# Patient Record
Sex: Female | Born: 1945
Health system: Southern US, Community
[De-identification: ages and names within clinical notes are randomized; demographics above are authoritative.]

## PROBLEM LIST (undated history)

## (undated) DIAGNOSIS — M1990 Unspecified osteoarthritis, unspecified site: Secondary | ICD-10-CM

## (undated) DIAGNOSIS — R0981 Nasal congestion: Secondary | ICD-10-CM

## (undated) DIAGNOSIS — J302 Other seasonal allergic rhinitis: Secondary | ICD-10-CM

## (undated) DIAGNOSIS — H269 Unspecified cataract: Secondary | ICD-10-CM

## (undated) DIAGNOSIS — K219 Gastro-esophageal reflux disease without esophagitis: Secondary | ICD-10-CM

## (undated) DIAGNOSIS — D649 Anemia, unspecified: Secondary | ICD-10-CM

## (undated) DIAGNOSIS — Z9889 Other specified postprocedural states: Secondary | ICD-10-CM

## (undated) DIAGNOSIS — E039 Hypothyroidism, unspecified: Secondary | ICD-10-CM

## (undated) DIAGNOSIS — I10 Essential (primary) hypertension: Secondary | ICD-10-CM

## (undated) DIAGNOSIS — C50919 Malignant neoplasm of unspecified site of unspecified female breast: Secondary | ICD-10-CM

## (undated) DIAGNOSIS — R05 Cough: Secondary | ICD-10-CM

## (undated) DIAGNOSIS — M069 Rheumatoid arthritis, unspecified: Secondary | ICD-10-CM

## (undated) DIAGNOSIS — C50411 Malignant neoplasm of upper-outer quadrant of right female breast: Principal | ICD-10-CM

## (undated) DIAGNOSIS — E079 Disorder of thyroid, unspecified: Secondary | ICD-10-CM

## (undated) DIAGNOSIS — R112 Nausea with vomiting, unspecified: Secondary | ICD-10-CM

## (undated) DIAGNOSIS — R059 Cough, unspecified: Secondary | ICD-10-CM

## (undated) HISTORY — DX: Rheumatoid arthritis, unspecified: M06.9

## (undated) HISTORY — DX: Gastro-esophageal reflux disease without esophagitis: K21.9

## (undated) HISTORY — PX: TONSILLECTOMY: SUR1361

## (undated) HISTORY — DX: Unspecified osteoarthritis, unspecified site: M19.90

## (undated) HISTORY — PX: CARPAL TUNNEL RELEASE: SHX101

## (undated) HISTORY — PX: POLYPECTOMY: SHX149

## (undated) HISTORY — PX: THYROIDECTOMY: SHX17

## (undated) HISTORY — PX: TUBAL LIGATION: SHX77

## (undated) HISTORY — DX: Disorder of thyroid, unspecified: E07.9

## (undated) HISTORY — DX: Unspecified cataract: H26.9

## (undated) HISTORY — DX: Malignant neoplasm of upper-outer quadrant of right female breast: C50.411

## (undated) HISTORY — PX: COLONOSCOPY: SHX174

---

## 1997-10-15 ENCOUNTER — Encounter: Admission: RE | Admit: 1997-10-15 | Discharge: 1997-10-15 | Payer: Self-pay | Admitting: Family Medicine

## 1998-08-26 ENCOUNTER — Other Ambulatory Visit: Admission: RE | Admit: 1998-08-26 | Discharge: 1998-08-26 | Payer: Self-pay | Admitting: Internal Medicine

## 1999-06-01 HISTORY — PX: BREAST REDUCTION SURGERY: SHX8

## 1999-09-07 ENCOUNTER — Encounter (INDEPENDENT_AMBULATORY_CARE_PROVIDER_SITE_OTHER): Payer: Self-pay | Admitting: Specialist

## 1999-09-07 ENCOUNTER — Ambulatory Visit (HOSPITAL_BASED_OUTPATIENT_CLINIC_OR_DEPARTMENT_OTHER): Admission: RE | Admit: 1999-09-07 | Discharge: 1999-09-07 | Payer: Self-pay | Admitting: Specialist

## 2000-11-10 ENCOUNTER — Other Ambulatory Visit: Admission: RE | Admit: 2000-11-10 | Discharge: 2000-11-10 | Payer: Self-pay | Admitting: Obstetrics and Gynecology

## 2000-12-15 ENCOUNTER — Other Ambulatory Visit: Admission: RE | Admit: 2000-12-15 | Discharge: 2000-12-15 | Payer: Self-pay | Admitting: Orthopaedic Surgery

## 2001-02-17 ENCOUNTER — Other Ambulatory Visit: Admission: RE | Admit: 2001-02-17 | Discharge: 2001-02-17 | Payer: Self-pay | Admitting: Internal Medicine

## 2001-02-17 ENCOUNTER — Encounter (INDEPENDENT_AMBULATORY_CARE_PROVIDER_SITE_OTHER): Payer: Self-pay | Admitting: Specialist

## 2001-05-31 HISTORY — PX: WRIST ARTHRODESIS: SUR65

## 2002-01-11 ENCOUNTER — Other Ambulatory Visit: Admission: RE | Admit: 2002-01-11 | Discharge: 2002-01-11 | Payer: Self-pay | Admitting: Obstetrics and Gynecology

## 2002-04-17 ENCOUNTER — Ambulatory Visit (HOSPITAL_BASED_OUTPATIENT_CLINIC_OR_DEPARTMENT_OTHER): Admission: RE | Admit: 2002-04-17 | Discharge: 2002-04-17 | Payer: Self-pay | Admitting: Orthopaedic Surgery

## 2002-05-31 HISTORY — PX: KNEE ARTHROSCOPY: SUR90

## 2002-06-05 ENCOUNTER — Ambulatory Visit (HOSPITAL_BASED_OUTPATIENT_CLINIC_OR_DEPARTMENT_OTHER): Admission: RE | Admit: 2002-06-05 | Discharge: 2002-06-05 | Payer: Self-pay | Admitting: Orthopaedic Surgery

## 2003-01-14 ENCOUNTER — Other Ambulatory Visit: Admission: RE | Admit: 2003-01-14 | Discharge: 2003-01-14 | Payer: Self-pay | Admitting: Obstetrics and Gynecology

## 2003-12-28 ENCOUNTER — Emergency Department (HOSPITAL_COMMUNITY): Admission: EM | Admit: 2003-12-28 | Discharge: 2003-12-28 | Payer: Self-pay | Admitting: Emergency Medicine

## 2004-01-15 ENCOUNTER — Other Ambulatory Visit: Admission: RE | Admit: 2004-01-15 | Discharge: 2004-01-15 | Payer: Self-pay | Admitting: Obstetrics and Gynecology

## 2004-04-03 ENCOUNTER — Ambulatory Visit: Payer: Self-pay | Admitting: Internal Medicine

## 2004-05-31 HISTORY — PX: CARPAL TUNNEL RELEASE: SHX101

## 2004-05-31 HISTORY — PX: SHOULDER ARTHROSCOPY W/ ROTATOR CUFF REPAIR: SHX2400

## 2004-06-02 ENCOUNTER — Ambulatory Visit (HOSPITAL_COMMUNITY): Admission: RE | Admit: 2004-06-02 | Discharge: 2004-06-02 | Payer: Self-pay | Admitting: Orthopaedic Surgery

## 2004-06-02 ENCOUNTER — Ambulatory Visit (HOSPITAL_BASED_OUTPATIENT_CLINIC_OR_DEPARTMENT_OTHER): Admission: RE | Admit: 2004-06-02 | Discharge: 2004-06-02 | Payer: Self-pay | Admitting: Orthopaedic Surgery

## 2005-01-19 ENCOUNTER — Other Ambulatory Visit: Admission: RE | Admit: 2005-01-19 | Discharge: 2005-01-19 | Payer: Self-pay | Admitting: Obstetrics and Gynecology

## 2006-02-03 ENCOUNTER — Other Ambulatory Visit: Admission: RE | Admit: 2006-02-03 | Discharge: 2006-02-03 | Payer: Self-pay | Admitting: Obstetrics and Gynecology

## 2006-04-07 ENCOUNTER — Encounter: Admission: RE | Admit: 2006-04-07 | Discharge: 2006-04-07 | Payer: Self-pay | Admitting: Family Medicine

## 2006-06-22 ENCOUNTER — Encounter: Admission: RE | Admit: 2006-06-22 | Discharge: 2006-06-22 | Payer: Self-pay | Admitting: Internal Medicine

## 2007-01-26 ENCOUNTER — Encounter: Admission: RE | Admit: 2007-01-26 | Discharge: 2007-01-26 | Payer: Self-pay | Admitting: Orthopaedic Surgery

## 2007-03-27 ENCOUNTER — Ambulatory Visit: Payer: Self-pay | Admitting: Internal Medicine

## 2007-04-13 ENCOUNTER — Ambulatory Visit: Payer: Self-pay | Admitting: Internal Medicine

## 2007-06-01 HISTORY — PX: CERVICAL FUSION: SHX112

## 2007-08-09 ENCOUNTER — Ambulatory Visit (HOSPITAL_COMMUNITY): Admission: RE | Admit: 2007-08-09 | Discharge: 2007-08-10 | Payer: Self-pay | Admitting: Orthopedic Surgery

## 2009-11-21 ENCOUNTER — Encounter: Admission: RE | Admit: 2009-11-21 | Discharge: 2009-11-21 | Payer: Self-pay | Admitting: Internal Medicine

## 2010-05-15 ENCOUNTER — Encounter
Admission: RE | Admit: 2010-05-15 | Discharge: 2010-05-15 | Payer: Self-pay | Source: Home / Self Care | Attending: Podiatry | Admitting: Podiatry

## 2010-06-22 ENCOUNTER — Other Ambulatory Visit: Payer: Self-pay

## 2010-10-13 NOTE — Op Note (Signed)
NAME:  Evelyn Orr, Evelyn Orr            ACCOUNT NO.:  [id number]   MEDICAL RECORD NO.:  [id number]          PATIENT TYPE:  AMB   LOCATION:  SDS                          FACILITY:  [facility]   PHYSICIAN:  Alvy Beal, MD    DATE OF BIRTH:  1946-04-18   DATE OF PROCEDURE:  08/09/2007  DATE OF DISCHARGE:                               OPERATIVE REPORT   HISTORY:  Vara Guardian is a very pleasant 65 year old woman with longstanding  cervical spine pain and bilateral upper extremity and shoulder pain.  She has been under my care for quite some time and I attempted to treat  her pain conservatively.  She has failed to improve with injection  therapy, physiotherapy, manipulations and narcotic medications.  Because  of progression of her symptoms and ongoing pain, she elected to proceed  with surgery.  All appropriate risks, benefits and alternatives to  surgery were discussed with the patient and she elected to proceed with  surgery.   PREOPERATIVE DIAGNOSIS:  Two level cervical degenerative disk disease  with radicular pain C5-6, C6-7.   POSTOPERATIVE DIAGNOSIS:  Two level cervical degenerative disk disease  with radicular pain C5-6, C6-7.   OPERATIVE PROCEDURE:  Anterior cervical diskectomy and fusion C5-6, C6-  7.   FIRST ASSISTANT:  Crissie Reese, PA   OPERATIVE NOTE:  The patient is brought to the operating room, placed  supine on the operating table.  After successful induction of general  anesthesia endotracheal intubation, TEDs, SCDs and Foley were applied.  Rolled towels were placed beneath the shoulder blades.  The shoulders  were taped down and the anterior cervical spine was prepped and draped  in standard fashion.  A standard Clementeen Graham approach through  longitudinal left sided incision was then made.  An incision was made on  the medial side of the sternocleidomastoid.  Sharp dissection was  carried out down to and through the platysma.  I sharply dissected  through the deep  cervical fascia, identified the omohyoid and released  it from its sleeve.  This allowed me to mobilize enough that I did not  need to resect it.   At this point I could freely mobilized the trachea, esophagus medially  and I could palpate the anterior cervical spine.  Once was palpable, I  then swept the deep cervical fascia, prevertebral fascia off and exposed  the anterior cervical longitudinal ligament.  A needle was placed at the  5-6 interspace and lateral fluoroscopy was used to confirm that this was  the appropriate level.  Once confirmed I then resected the anterior  longitudinal ligament from the mid body of C5 to the midbody of C7 with  electrocautery and mobilized the longus coli muscles out laterally to  the level of the uncovertebral joints.  I then placed distraction pins  into the bodies of C5, C6 and C7 and then placed self-retaining  retractor in the wound.  Once the Caspar blades were positioned, I  deflated the endotracheal cuff, expanded the distractor and reinflated  the cuff.   At this point I used a 15 blade scalpel to incise the 5-6  disk space  then used a combination of pituitary rongeurs, curettes and Kerrison  rongeurs to resect the entire disk material.  Once all the disk material  resected, I then used a fine micro curette to develop a plane underneath  the posterior longitudinal ligament.  That was resected with a 1-mm  Kerrison.  At this point I had excellent visualization of the anterior  thecal sac and I could freely pass a micro nerve hook underneath the  bodies of C5 and C6 and out the uncovertebral joints. Satisfied with the  diskectomy/decompression, I repeated the entire procedure in similar  fashion at C6-7.   Once both diskectomies were done, I then measured the interbody space  and placed 5 mm parallel precut graft packed with DBX at 5-6 and a 5 mm  lordotic graft at 6-7.  I then contoured a 28 mm long anterior cervical  Vector [employer]ynthes plate  and affixed it to the body of C5 and C7 with 14 mm  variable angle screws and affixed it to the body of C6 with 14 mm  screws.  I then removed all the retractors and checked to ensure that  the esophagus was not entrapped beneath the plate then returned the  trachea and esophagus to midline.  I copiously irrigated with normal  saline, obtained hemostasis using bipolar electrocautery, closed the  platysma with interrupted 2-0 Vicryl sutures and the skin with 3-0  Monocryl.  Steri-Strips, dry dressing and a collar were applied.  She  was extubated and transferred to PACU without incident.  At the end of  the case, all needle and sponge counts were correct.      Alvy Beal, MD  Electronically Signed     DDB/MEDQ  D:  08/09/2007  T:  08/10/2007  Job:  [id number]

## 2010-10-16 NOTE — Op Note (Signed)
[place]. [facility]  Patient:    Evelyn Orr, Evelyn Orr                     MRN: [id number] Adm. Date:  [id number] Attending:  Gustavus Messing                           Operative Report  INDICATIONS FOR PROCEDURE:  Patient with severe macromastia with back and shoulder pain secondary to large, pendulous breast, with the left side larger than the right, increased pitting of both shoulder areas and intertrigo as well as increased accessory breast tissue.  PROCEDURES PLANNED:  Bilateral breast reduction and excision of accessory breast tissue.  SURGEON:  Yaakov Guthrie. Shon Hough, M.D.  ASSISTANT:  ______  ANESTHESIA:  General.  DESCRIPTION OF PROCEDURE:  The patient was set up and drawn for the inferior pedicle reduction mammoplasty.  We Marcained the nipple areolar complexes back o 20 cm from the suprasternal notch.  She then underwent general anesthesia and was intubated orally.  Prep was done to the chest and breast areas in a routine fashion using Betadine soap and solution, walled off with sterile towels and drapes so s to make a sterile field.  The areas were scored with a #15 blade and the  skin ver the inferior pedicles epithelialized with a #20 blade.  Next, the medial and lateral fatty dermal pedicles were excised underlying.  The pectoralis fascia outlining mammary tissue was taken as well as accessory breast tissue.  The new  keyhole area was debulked and ______ and transposed and stayed with 3-0 Prolene. Subcutaneous closure was done with 3-0 Monocryl x 2 layers and a running subcuticular stitch with 3-0 Monocryl and 5-0 Monocryl throughout the inverted . They were drained with #10 Blake drains which were placed in the depths of the wound and brought out through the lateral portions of the incision and secured ith 3-0 Prolene.  Steri-Strips and soft dressings were applied to all of the areas including Xeroform, 4 x 4, ABDs, Hypafix  tape.  She withstood the procedure very well and was taken to recovery in good condition.  She had approximately 100 cc  loss. DD:  09/07/99 TD:  09/08/99 Job: [id number]

## 2010-10-16 NOTE — Op Note (Signed)
NAMEMarland Kitchen  Evelyn Orr, Evelyn Orr                        ACCOUNT NO.:  [id number]   MEDICAL RECORD NO.:  [id number]                   PATIENT TYPE:  AMB   LOCATION:  DSC                                  FACILITY:  [facility]   PHYSICIAN:  Lubertha Basque. Jerl Santos, M.D.             DATE OF BIRTH:  1945/09/01   DATE OF PROCEDURE:  06/05/2002  DATE OF DISCHARGE:                                 OPERATIVE REPORT   PREOPERATIVE DIAGNOSES:  1. Right knee degenerative joint disease.  2. Right knee synovitis.   POSTOPERATIVE DIAGNOSES:  1. Right knee degenerative joint disease.  2. Right knee synovitis.   OPERATION PERFORMED:  1. Right knee chondroplasty  2. Right knee synovectomy.   SURGEON:  Lubertha Basque. Jerl Santos, M.D.   ASSISTANT:  Prince Rome, P.A.   ANESTHESIA:  General.   INDICATIONS FOR PROCEDURE:  The patient is a 65 year old woman with a  history consistent with rheumatoid arthritis.  She has had difficulty with  right knee pain and swelling for many months.  She has had several  aspirations and injections which have afforded her transient relief.  By x-  ray she does not have much in the way of joint space narrowing and at this  point was offered an arthroscopy.  The procedure was discussed with the  patient and informed operative consent was obtained after discussion of  possible complications of reaction to anesthesia and infection.   DESCRIPTION OF PROCEDURE:  The patient was taken to the operating suite  where general anesthetic was applied without difficulty.  The patient was  positioned supine and prepped and draped in the normal sterile fashion.  After administration of preop intravenous antibiotics, an arthroscopy of the  right knee was performed through two inferior portals.  The suprapatellar  pouch was benign as was the patellofemoral joint.  The medial compartment  exhibited some large flaps of articular cartilage which were loose off the  medial femoral condyle.  This was  addressed with a thorough debridement back  to stable structures.  Most of the change was grade 3 in nature.  She also  had an abundance of hypertrophic erythematous synovium consistent with an  inflammatory arthropathy.  A thorough synovectomy was done in this  compartment and also in the lateral compartment and to a lesser degree  through the notch.  The lateral compartment showed no evidence of meniscal  or articular cartilage injury and the ACL and PCL were intact.  The knee was  thoroughly irrigated followed by placement of Marcaine with epinephrine and  morphine plus Depo-Medrol.  Adaptic was placed over the portals followed by  dry gauze and a loose Ace wrap.  Estimated blood loss and intraoperative  fluids can be obtained from anesthesia records.    DISPOSITION:  The patient was taken to the recovery room in stable  condition.  Plans were for the patient  to go home the same day  and to  follow up in the office in less than a week.  I will contact her by phone  tonight.                                                 Lubertha Basque Jerl Santos, M.D.    PGD/MEDQ  D:  06/05/2002  T:  06/05/2002  Job:  [id number]

## 2010-10-16 NOTE — Op Note (Signed)
NAMEMarland Kitchen  Evelyn Orr, Evelyn Orr            ACCOUNT NO.:  [id number]   MEDICAL RECORD NO.:  [id number]          PATIENT TYPE:  AMB   LOCATION:  DSC                          FACILITY:  [facility]   PHYSICIAN:  Lubertha Basque. Dalldorf, M.D.DATE OF BIRTH:  07-Sep-1945   DATE OF PROCEDURE:  06/02/2004  DATE OF DISCHARGE:                                 OPERATIVE REPORT   PREOPERATIVE DIAGNOSES:  1.  Right shoulder rotator cuff tear.  2.  Right shoulder impingement.  3.  Right hand carpal tunnel syndrome.   POSTOPERATIVE DIAGNOSES:  1.  Right shoulder partial rotator cuff tear.  2.  Right shoulder impingement.  3.  Right hand carpal tunnel syndrome.   ANESTHESIA:  General.   ATTENDING SURGEON:  Lubertha Basque. Jerl Santos, M.D.   ASSISTANT:  Lindwood Qua, P.A.   INDICATION FOR PROCEDURE:  The patient is a 65 year old woman with a long  history of right arm difficulty.  She has had nerve studies done in the past  which have revealed a mild carpal tunnel syndrome.  She has been treated  with braces and anti-inflammatories and an injection.  She persists with  some difficulty.  She has persistent numbness with activity and some night  pain.  At the shoulder, she has great difficulty which makes it troublesome  for her to sleep on this side.  She has trouble using her arm out in front  of herself and overhead.  By MRI, she has a small rotator cuff tear and  things consistent with impingement.  She did respond in a transient fashion  to subacromial injection  She has been offered arthroscopy of the shoulder  and a carpal tunnel release at the same sitting on the right.  Informed  operative consent was obtained after discussion of possible complication of,  reaction to anesthesia, infection, and neurovascular injury.   DESCRIPTION OF PROCEDURE:  The patient was taken to the operating suite  where general anesthetic was applied without difficulty.  She was positioned  in the beach-chair position and prepped and  draped in normal sterile  fashion.  After the administration of preoperative IV antibiotics, A  tourniquet was inflated about her forearm, exsanguinating the hand and  wrist.  A small palmar incision was made with dissection down to the  transverse carpal ligament.  The incision was near the thenar flexion crease  and did not cross the wrist flexion crease.  Dissection was carried down to  the ligament which was released under direct visualization.  The dissection  was taken distally to the transverse arch of vessels proximally through the  distal fascia of the forearm.  There was some mild thickening of the  ligament with some mild compression of the nerve.  This wound was then  irrigated, followed by reapproximation of the skin with vertical mattresses  of nylon.  Some Marcaine was injected, and the tourniquet was deflated. The  fingers became pink and warm immediately.  Adaptic was applied to the wound  followed by a dry gauze and a loose Ace wrap.  Attention was then turned  toward the shoulder.  An arthroscopy was  performed through a total of two  portals.  The glenohumeral joint showed no degenerative change while the  biceps tendon and rotator cuff appeared intact from below.  In the  subacromial space, she had a great deal of bursitis and things consistent  with a partial-thickness rotator cuff tear which was debrided.  No full-  thickness tear could be seen.  She had a prominent subacromial morphology  addressed with an acromioplasty back to a flat surface.  This was done with  a bur in the lateral position followed by transfer of the bur to the  posterior position.  The [facility] joint appeared benign and was not addressed as  she had no pain in that location.  The shoulder was thoroughly irrigated  followed by the placement of Marcaine with epinephrine and morphine.  Simple  sutures of nylon were used to loosely reapproximate the portals, followed by  Adaptic and a dry gauze dressing  with tape.  Estimated blood loss and  intraoperative records can be obtained from the anesthesia records.   DISPOSITION:  The patient was extubated in the operating room and taken to  the recovery room in stable condition.  Plans were for her to go home the  same day and to follow up in the office in less than a week.  I will contact  her by phone tonight.      Cindee Lame   PGD/MEDQ  D:  06/02/2004  T:  06/02/2004  Job:  [id number]

## 2010-10-16 NOTE — Op Note (Signed)
NAME:  Evelyn Orr, Evelyn Orr                        ACCOUNT NO.:  [id number]   MEDICAL RECORD NO.:  [id number]                   PATIENT TYPE:  AMB   LOCATION:  DSC                                  FACILITY:  [facility]   PHYSICIAN:  Lubertha Basque. Jerl Santos, M.D.             DATE OF BIRTH:  08-17-1945   DATE OF PROCEDURE:  04/17/2002  DATE OF DISCHARGE:                                 OPERATIVE REPORT   PREOPERATIVE DIAGNOSES:  1. Left wrist basal joint arthritis.  2. Left wrist carpal tunnel syndrome.  3. Right knee degenerative joint disease.   POSTOPERATIVE DIAGNOSES:  1. Left wrist basal joint arthritis.  2. Left wrist carpal tunnel syndrome.  3. Right knee degenerative joint disease.   OPERATION PERFORMED:  1. A left wrist LRTI.  2. Left wrist carpal tunnel release.  3. Right knee injection.   SURGEON:  Lubertha Basque. Jerl Santos, M.D.   ASSISTANT:  Prince Rome, P.A.   ANESTHESIA:  General.   INDICATIONS FOR PROCEDURE:  The patient is a 65 year old woman with a long  history of left wrist pain.  This persisted despite oral anti-  inflammatories, bracing and injections.  She is noted to have end stage  degenerative change at her basal joint.  She has developed a large cyst in  the area as well.  She has also had symptoms of carpal tunnel with numbness  in the median distribution.  Nerve studies just show a mild carpal tunnel  syndrome.  At this point she is offered operative intervention at her thumb  as she has disabling pain with activities of daily living including  activities at her job.  As she is going to the operating table for an  anesthetic, we decided to perform the carpal tunnel release as well.  She  also mentioned some right knee pain and swelling and wished to have this  joint injected, so that was offered to her as well.  The procedures were  discussed with the patient and informed operative consent was obtained after  discussion of possible complications of reaction  to anesthesia, infection,  neurovascular injury.   DESCRIPTION OF PROCEDURE:  The patient was taken to the operating suite  where general anesthetic was applied without difficulty.  The patient was  positioned supine and prepped and draped in the normal sterile fashion.  After administration of preop intravenous antibiotics, the left arm was  elevated, exsanguinated, tourniquet inflated about the upper arm.  An  incision was made along the basal joint which extended up the forearm  slightly over the FCR tendon.  Dissection was carried down to the FCR which  was freed up, up into the forearm.  Dissection was then carried down to the  basal joint.  Volar and dorsal flaps were made of the basal joint capsule.  She had a large cyst emanating from this joint which was excised.  A saw,  rongeur and osteotomes  were used to remove the entire trapezium.  X-ray was  used to confirm that this entire bone had been removed.  The FCR tendon was  then harvested with a tendon stripper up to the musculotendinous junction.  A separate incision was made on the forearm for part of this harvest.  This  was left attached distally at the base of the second metacarpal.  A drill  hole was made in the first metacarpal base on the side with the nail which  was dorsal.  This was done with a 3/16 drill bit.  The FCR tendon which had  been harvested was passed through the base of the metacarpal and out the  dorsal aspect through this hole.  This tendon was then sutured at the exit  point from this hole and then to itself in the base of the wound to  reconstruct the beak ligament.  The remaining tendon was then wrapped up on  itself to form an anchovy.  Ethibond suture was used to stabilize this.  An  Ethibond was then placed in the base of the basal joint area and passed  through this anchovy.  The anchovy was then pushed down along this Ethibond  suture and the suture was utilized to secure the anchovy in the base of  the  wound.  The capsule of the basal joint was then repaired with absorbable  suture.  A carpal tunnel release was then performed through a separate  incision.  A small palmar incision was made ulnar to the thenar flexion  crease which did not cross the wrist flexion crease.  This was 4 or 5 cm  away from her basal joint incision.  Dissection was carried down through the  palmar fascia to expose the transverse carpal ligament which was released  under direct visualization.  Dissection was taken distally to the transverse  arch of vessels and proximally to the distal fascia of the forearm.  Once  this had been completed, the surgeon's small finger could be inserted  through the carpal tunnel without much difficulty.  The wound was irrigated.  The tourniquet was then deflated.  It appeared that I had inadvertently cut  a portion of the radial artery.  She had good refill ulnarly and excellent  refill to the tips of her fingers.  It was felt best just to ligate this and  this was done with some Vicryl suture.  She had an excellent ulnar pulse and  good refill to the tips of all of her fingers after this had been  accomplished.  some pressure was held on the wounds until the bleeding had  slowed and the carpal tunnel incision was closed with nylon vertical  mattresses.  The basal joint incision was closed with identical suture in  vertical mattress fashion.  The small incision in the forearm was also  reapproximated with the same suture.  Adaptic was placed on the wounds  followed by dry gauze and a thumb spica split of plaster.  After sterile  prep with Betadine and alcohol, I then injected the right knee with Depo-  Medrol and lidocaine.   DISPOSITION:  The patient was extubated in the operating room and taken to  recovery room in stable condition.  Plans were for her to stay overnight for  pain control and probable discharge home in the morning.  Lubertha Basque Jerl Santos, M.D.    PGD/MEDQ  D:  04/17/2002  T:  04/17/2002  Job:  [id number]

## 2011-02-22 LAB — URINALYSIS, ROUTINE W REFLEX MICROSCOPIC
Glucose, UA: NEGATIVE
Hgb urine dipstick: NEGATIVE
Ketones, ur: NEGATIVE
Protein, ur: NEGATIVE
pH: 5

## 2011-02-22 LAB — URINE CULTURE: Colony Count: 7000

## 2011-02-22 LAB — BASIC METABOLIC PANEL
BUN: 6
CO2: 26
CO2: 28
Calcium: 9.3
Calcium: 9.5
Chloride: 105
Creatinine, Ser: 0.61
GFR calc Af Amer: >60
GFR calc non Af Amer: >60
Glucose, Bld: 105 — ABNORMAL HIGH
Glucose, Bld: 113 — ABNORMAL HIGH
Potassium: 3.9
Sodium: 140

## 2011-02-22 LAB — CBC
HCT: 40
Hemoglobin: 12.6
Hemoglobin: 13.7
MCHC: 33.3
MCHC: 34.1
MCV: 92.2
Platelets: 312
RBC: 4.34
RDW: 13.8
RDW: 13.9

## 2011-02-22 LAB — URINE MICROSCOPIC-ADD ON

## 2011-03-25 ENCOUNTER — Ambulatory Visit (INDEPENDENT_AMBULATORY_CARE_PROVIDER_SITE_OTHER): Payer: Self-pay | Admitting: General Surgery

## 2011-04-08 ENCOUNTER — Encounter (INDEPENDENT_AMBULATORY_CARE_PROVIDER_SITE_OTHER): Payer: Self-pay | Admitting: General Surgery

## 2011-04-08 ENCOUNTER — Ambulatory Visit (INDEPENDENT_AMBULATORY_CARE_PROVIDER_SITE_OTHER): Payer: BC Managed Care – PPO | Admitting: General Surgery

## 2011-04-08 VITALS — BP 128/88 | HR 60 | Temp 97.0°F | Resp 16 | Ht 62.0 in | Wt 171.1 lb

## 2011-04-08 DIAGNOSIS — R928 Other abnormal and inconclusive findings on diagnostic imaging of breast: Secondary | ICD-10-CM

## 2011-04-08 DIAGNOSIS — R921 Mammographic calcification found on diagnostic imaging of breast: Secondary | ICD-10-CM

## 2011-04-08 NOTE — Patient Instructions (Signed)
You will need to get a follow-up Right breast mammogram in April 2013

## 2011-04-08 NOTE — Progress Notes (Signed)
Chief Complaint  Patient presents with  . New Evaluation    eval of breast calcifications     HPI Evelyn Orr is a 65 y.o. female.   HPI 65 year old African American female referred by her primary care physician's office for evaluation for right breast microcalcifications. The patient states that she underwent her routine annual mammogram and was called back for a followup mammogram on her right side.  She denies any breast pain. She denies any nipple or skin changes. She denies any nipple discharge. She denies any weight loss. She denies any lymphadenopathy. She denies any previous breast biopsies. She denies any personal history of cancer. She states that she had a paternal aunt with breast cancer. She also states that her maternal grandfather died at age 7 from colon cancer.  Menarche was at age 73. She's had 2 pregnancies 2 children. Her first pregnancy was at age 55. She is currently going thru menopause. She is not on any hormone replacement therapy. Past Medical History  Diagnosis Date  . Arthritis   . Thyroid disease     hypothyroidism    Past Surgical History  Procedure Date  . Breast reduction surgery     History reviewed. No pertinent family history.  Social History History  Substance Use Topics  . Smoking status: Former Games developer  . Smokeless tobacco: Never Used  . Alcohol Use: Yes    No Known Allergies  Current Outpatient Prescriptions  Medication Sig Dispense Refill  . levothyroxine (SYNTHROID, LEVOTHROID) 88 MCG tablet Take 88 mcg by mouth daily.        . Methotrexate Sodium (METHOTREXATE PO) Take by mouth. Pt takes 8 pills once a week         Review of Systems Review of Systems  Constitutional: Negative for fever, chills, fatigue and unexpected weight change.  HENT: Negative for hearing loss, congestion, sore throat, trouble swallowing and voice change.   Eyes: Negative for visual disturbance.  Respiratory: Negative for apnea, cough and wheezing.    Cardiovascular: Negative for chest pain, palpitations and leg swelling.  Gastrointestinal: Negative for nausea, vomiting, abdominal pain, diarrhea, constipation, blood in stool, abdominal distention and anal bleeding.  Genitourinary: Negative for hematuria, vaginal bleeding and difficulty urinating.  Musculoskeletal: Negative for arthralgias.  Skin: Negative for rash and wound.  Neurological: Negative for seizures, syncope and headaches.  Hematological: Negative for adenopathy. Does not bruise/bleed easily.  Psychiatric/Behavioral: Negative for confusion.    Blood pressure 128/88, pulse 60, temperature 97 F (36.1 C), temperature source Temporal, resp. rate 16, height 5\' 2"  (1.575 m), weight 171 lb 2 oz (77.622 kg).  Physical Exam Physical Exam  Vitals reviewed. Constitutional: She is oriented to person, place, and time. She appears well-developed and well-nourished. No distress.  HENT:  Head: Normocephalic and atraumatic.  Eyes: Pupils are equal, round, and reactive to light. No scleral icterus.  Neck: Normal range of motion. Neck supple. No thyromegaly present.  Cardiovascular: Normal rate, regular rhythm and normal heart sounds.   Pulmonary/Chest: Effort normal and breath sounds normal. No respiratory distress. She has no wheezes. Right breast exhibits no inverted nipple, no mass, no nipple discharge and no skin change. Left breast exhibits no inverted nipple, no mass, no nipple discharge and no skin change.         B/l breast reductions scars. No masses. nontender  Abdominal: Soft. Bowel sounds are normal. She exhibits no distension. There is no tenderness.  Musculoskeletal: Normal range of motion. She exhibits no edema.  Lymphadenopathy:    She has no cervical adenopathy.  Neurological: She is alert and oriented to person, place, and time. She exhibits normal muscle tone.  Skin: Skin is warm and dry.  Psychiatric: She has a normal mood and affect. Her behavior is normal.  Judgment and thought content normal.    Data Reviewed Personally reviewed her mammograms from 10/12 and 2011 Personally reviewed mammography reports from 2012 and 2011  March 02, 2011-additional imaging of the right breast shows a tight less than 5 mm cluster of microcalcifications. Calcifications have dystrophic appearance. Associated density is stable compared to films back to 2006. There are no signs of malignancy. BI-RADS 3  March 01, 2011-fulfill digital mammography was performed and reviewed with computer-aided detection. Compared to previous, on the right there is a potential abnormality. There is been no significant interval change on the left.  February 26, 2010-bilateral screening mammogram-scattered parenchymal densities throughout both breasts with no worrisome findings were significant interval change.   Assessment    Right breast microcalifications    Plan    We discussed breast calcifications. We reviewed her mammograms together. We discussed continued observation with short interval followup with a right breast mammogram in April 2013 versus stereotactic biopsy. We discussed the pros and cons of each treatment option. My suspicion for this being a malignancy is low. The patient has elected to do short interval followup with a right breast mammogram in 6 months at [facility].  Mary Sella. Andrey Campanile, MD, FACS General, Bariatric, & Minimally Invasive Surgery [facility], [place]        [facility] M 04/08/2011, 2:39 PM

## 2011-05-26 ENCOUNTER — Encounter (INDEPENDENT_AMBULATORY_CARE_PROVIDER_SITE_OTHER): Payer: Self-pay | Admitting: Obstetrics and Gynecology

## 2012-03-17 ENCOUNTER — Encounter: Payer: Self-pay | Admitting: Obstetrics and Gynecology

## 2012-03-17 ENCOUNTER — Ambulatory Visit (INDEPENDENT_AMBULATORY_CARE_PROVIDER_SITE_OTHER): Payer: BC Managed Care – PPO | Admitting: Obstetrics and Gynecology

## 2012-03-17 VITALS — BP 112/74 | HR 76 | Resp 12 | Wt 172.0 lb

## 2012-03-17 DIAGNOSIS — Z01419 Encounter for gynecological examination (general) (routine) without abnormal findings: Secondary | ICD-10-CM

## 2012-03-17 DIAGNOSIS — E079 Disorder of thyroid, unspecified: Secondary | ICD-10-CM

## 2012-03-17 DIAGNOSIS — M069 Rheumatoid arthritis, unspecified: Secondary | ICD-10-CM

## 2012-03-17 DIAGNOSIS — Z124 Encounter for screening for malignant neoplasm of cervix: Secondary | ICD-10-CM

## 2012-03-17 NOTE — Progress Notes (Signed)
Last Pap: 03-12-11  WNL: Yes Regular Periods:no Contraception: menopause   Monthly Breast exam:yes Tetanus<63yrs:yes Nl.Bladder Function:yes Daily BMs:yes Healthy Diet:yes Calcium:no Mammogram:yes Date of Mammogram: 03-10-12 Exercise:yes Have often Exercise: 2 x week Seatbelt: yes Abuse at home: no Stressful work:yes Sigmoid-colonoscopy: 2010? Bone Density: No PCP: Dorothyann Peng MD Change in PMH: no Change in FMH:no

## 2012-03-17 NOTE — Progress Notes (Signed)
Subjective:    Evelyn Orr is a 66 y.o. female No obstetric history on file. who presents for annual exam. The patient has no complaints today.    Review of Systems Gastrointestinal:No change in bowel habits, no abdominal pain, no rectal bleeding Genitourinary:negative for abnormal vaginal bleeding,  dysuria, frequency, hematuria, nocturia and urinary incontinence   Objective:     BP 112/74  Pulse 76  Resp 12  Wt 172 lb (78.019 kg) Weight:  Wt Readings from Last 1 Encounters:  03/17/12 172 lb (78.019 kg)   There is no height on file to calculate BMI. General Appearance:  Well nourished in no acute distress HEENT: Grossly normal Neck / Thyroid: Supple, no masses, nodes or enlargement Lungs: Clear to auscultation bilaterally Back: No CVA tenderness Breast Exam: No masses or nodes.No dimpling, nipple retraction or discharge. Cardiovascular: Regular rate and rhythm.  Gastrointestinal: Soft, non-tender, no masses or organomegaly Pelvic Exam: EGBUS-normal, vagina-mildly atrophic, mucosa without lesions, cervix-no tenderness or lesions, uterus-appears normal size, shape and consistency, adnexae-no masses or tenderness Rectovaginal: Normal sphincter tone and  no masses  Lymphatic Exam: Non-palpable nodes in neck, clavicular, axillary, or inguinal regions Skin: No rash or abnormalities Extremities: no clubbing cyanosis or edema Neurologic: Grossly normal Psychiatric: Alert and oriented x 3    Assessment:   Routine GYN Exam   Plan:   PAP sent  RTO 1 year or prn   Lukus Binion,ELMIRAPA-C

## 2013-05-31 HISTORY — PX: COLONOSCOPY: SHX174

## 2013-07-31 ENCOUNTER — Ambulatory Visit (INDEPENDENT_AMBULATORY_CARE_PROVIDER_SITE_OTHER): Payer: BC Managed Care – PPO | Admitting: Podiatry

## 2013-07-31 ENCOUNTER — Encounter: Payer: Self-pay | Admitting: Podiatry

## 2013-07-31 ENCOUNTER — Ambulatory Visit: Payer: Self-pay | Admitting: Podiatry

## 2013-07-31 VITALS — BP 128/77 | HR 76 | Ht 62.0 in | Wt 164.0 lb

## 2013-07-31 DIAGNOSIS — M21969 Unspecified acquired deformity of unspecified lower leg: Secondary | ICD-10-CM | POA: Insufficient documentation

## 2013-07-31 DIAGNOSIS — M7661 Achilles tendinitis, right leg: Secondary | ICD-10-CM

## 2013-07-31 DIAGNOSIS — M79606 Pain in leg, unspecified: Secondary | ICD-10-CM

## 2013-07-31 DIAGNOSIS — M79609 Pain in unspecified limb: Secondary | ICD-10-CM

## 2013-07-31 DIAGNOSIS — M7660 Achilles tendinitis, unspecified leg: Secondary | ICD-10-CM | POA: Insufficient documentation

## 2013-07-31 NOTE — Progress Notes (Signed)
Subjective: 68 year old female presents complaining of pain on right heel posterior plantar and under 5th MPJ right foot x 2 months.  Had history on left ankle injury in February 2015. 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   Objective: Hypermobile first and 5th ray right. No edema or erythema at the right heel. Positive of mild erythema under the 5th MPJ right. Subjective pain at plantar posterior aspect right heel.   Assessment: Tenosynovitis posterior plantar right heel. Achilles tendonitis right. Hypermobile first and 5th ray right.  Plan: Reviewed all findings and available options, injection, new orthotics, NSAIA. Will try Compounding cream at this time.

## 2013-07-31 NOTE — Patient Instructions (Signed)
Will try compounding cream. Return in 4 weeks.

## 2013-08-01 ENCOUNTER — Telehealth: Payer: Self-pay | Admitting: *Deleted

## 2013-08-01 NOTE — Telephone Encounter (Signed)
Pt called today 08/01/2013 and she states she already has this compounding cream, the same that was ordered yesterday.  She wanted Dr. Caffie Pinto to know this

## 2013-08-07 ENCOUNTER — Encounter: Payer: Self-pay | Admitting: Podiatry

## 2013-08-15 ENCOUNTER — Ambulatory Visit (INDEPENDENT_AMBULATORY_CARE_PROVIDER_SITE_OTHER): Payer: BC Managed Care – PPO

## 2013-08-15 ENCOUNTER — Encounter: Payer: Self-pay | Admitting: Podiatry

## 2013-08-15 ENCOUNTER — Ambulatory Visit: Payer: BC Managed Care – PPO

## 2013-08-15 ENCOUNTER — Ambulatory Visit (INDEPENDENT_AMBULATORY_CARE_PROVIDER_SITE_OTHER): Payer: BC Managed Care – PPO | Admitting: Podiatry

## 2013-08-15 VITALS — Ht 62.0 in | Wt 160.0 lb

## 2013-08-15 DIAGNOSIS — M722 Plantar fascial fibromatosis: Secondary | ICD-10-CM

## 2013-08-15 DIAGNOSIS — M79609 Pain in unspecified limb: Secondary | ICD-10-CM

## 2013-08-15 DIAGNOSIS — M79673 Pain in unspecified foot: Secondary | ICD-10-CM

## 2013-08-15 MED ORDER — TRIAMCINOLONE ACETONIDE 10 MG/ML IJ SUSP
10.0000 mg | Freq: Once | INTRAMUSCULAR | Status: AC
  Administered 2013-08-15: 10 mg

## 2013-08-15 NOTE — Patient Instructions (Signed)

## 2013-08-15 NOTE — Progress Notes (Signed)
   Subjective:    Patient ID: Evelyn Orr, female    DOB: Oct 04, 1945, 68 y.o.   MRN: [id number]  HPI Comments: "I have pain in my heel and the side of my foot"  Patient c/o aching posterior and plantar heel and lateral 5th MPJ right foot for a couple months. She does have some AM pain. She had some knee trouble and saw Orthopedist and she has orthotics she's been wearing. They help some. No other treatment. Patient denies seeing a doctor for this foot pain but note shows she has seen Dr Caffie Pinto.  Foot Pain   This patient is under care by orthopedic for left ankle pain.  Review of Systems  All other systems reviewed and are negative.       Objective:   Physical Exam  Orientated x3 black female  Vascular: DP and PT pulses 2/4 bilaterally  Neurological: Ankle reflexes reactive bilaterally  Dermatological: Low-grade edema noted on the medial left ankle (she relates a history of work injury to this ankle and is under care of orthopedic Dr.). Well-healed surgical incision medial right ankle noted  Musculoskeletal: Palpable tenderness the medial plantar right heel which is her area of maximum discomfort. This area seems to duplicates her primary pain sores. There is mild palpable tenderness in the fifth right MPJ area.   X-ray report right foot  Intact bony structure without a fracture and/or  dislocation noted The joint spaces throughout the foot appear within normal limits. Small posterior and inferior calcaneal spurs noted.  Radiographic impression: No acute bony abnormality noted right foot       Assessment & Plan:   Assessment: Plantar fasciitis right Altered gait pattern from right plantar fasciitis possibly causing lateral fifth MPJ pain. Left ankle pain may contribute to altered gait shifting more weight on right foot X-ray demonstrates no rheumatoid changes in the right foot  Plan: Skin is prepped with alcohol and Betadine and 10 mg of Kenalog mixed with 10  mg of plain Xylocaine and 2.5 mg of plain Marcaine are injected into the inferior heel right for Kenalog injection #1.  Shoeing and stretching discussed.  Reappoint x4 weeks.

## 2013-08-16 ENCOUNTER — Encounter: Payer: Self-pay | Admitting: Podiatry

## 2013-09-12 ENCOUNTER — Ambulatory Visit: Payer: BC Managed Care – PPO | Admitting: Podiatry

## 2013-10-09 ENCOUNTER — Encounter (HOSPITAL_BASED_OUTPATIENT_CLINIC_OR_DEPARTMENT_OTHER): Payer: Self-pay | Admitting: *Deleted

## 2013-10-09 NOTE — Progress Notes (Signed)
Labs and ekg and clearance by dr sanders To bring all meds and overnight bag-has had several ortho surgeries here

## 2013-10-10 ENCOUNTER — Other Ambulatory Visit: Payer: Self-pay | Admitting: Orthopedic Surgery

## 2013-10-11 ENCOUNTER — Encounter (HOSPITAL_BASED_OUTPATIENT_CLINIC_OR_DEPARTMENT_OTHER): Payer: Worker's Compensation | Admitting: Certified Registered"

## 2013-10-11 ENCOUNTER — Encounter (HOSPITAL_BASED_OUTPATIENT_CLINIC_OR_DEPARTMENT_OTHER): Payer: Self-pay

## 2013-10-11 ENCOUNTER — Ambulatory Visit (HOSPITAL_BASED_OUTPATIENT_CLINIC_OR_DEPARTMENT_OTHER): Payer: Worker's Compensation | Admitting: Certified Registered"

## 2013-10-11 ENCOUNTER — Ambulatory Visit (HOSPITAL_BASED_OUTPATIENT_CLINIC_OR_DEPARTMENT_OTHER)
Admission: RE | Admit: 2013-10-11 | Discharge: 2013-10-12 | Disposition: A | Discharge status: Home / Self Care | Payer: Worker's Compensation | Source: Ambulatory Visit | Attending: Orthopedic Surgery | Admitting: Orthopedic Surgery

## 2013-10-11 ENCOUNTER — Encounter (HOSPITAL_BASED_OUTPATIENT_CLINIC_OR_DEPARTMENT_OTHER)
Admission: RE | Disposition: A | Discharge status: Home / Self Care | Payer: Self-pay | Source: Ambulatory Visit | Attending: Orthopedic Surgery

## 2013-10-11 DIAGNOSIS — E039 Hypothyroidism, unspecified: Secondary | ICD-10-CM | POA: Insufficient documentation

## 2013-10-11 DIAGNOSIS — M2140 Flat foot [pes planus] (acquired), unspecified foot: Secondary | ICD-10-CM | POA: Insufficient documentation

## 2013-10-11 DIAGNOSIS — M6240 Contracture of muscle, unspecified site: Secondary | ICD-10-CM | POA: Insufficient documentation

## 2013-10-11 DIAGNOSIS — M069 Rheumatoid arthritis, unspecified: Secondary | ICD-10-CM | POA: Insufficient documentation

## 2013-10-11 DIAGNOSIS — M76829 Posterior tibial tendinitis, unspecified leg: Secondary | ICD-10-CM | POA: Insufficient documentation

## 2013-10-11 DIAGNOSIS — M76822 Posterior tibial tendinitis, left leg: Secondary | ICD-10-CM

## 2013-10-11 DIAGNOSIS — Z79899 Other long term (current) drug therapy: Secondary | ICD-10-CM | POA: Insufficient documentation

## 2013-10-11 DIAGNOSIS — Z87891 Personal history of nicotine dependence: Secondary | ICD-10-CM | POA: Insufficient documentation

## 2013-10-11 DIAGNOSIS — M129 Arthropathy, unspecified: Secondary | ICD-10-CM | POA: Insufficient documentation

## 2013-10-11 HISTORY — PX: GASTROCNEMIUS RECESSION: SHX863

## 2013-10-11 HISTORY — PX: CALCANEAL OSTEOTOMY: SHX1281

## 2013-10-11 LAB — POCT I-STAT, CHEM 8
BUN: 10 mg/dL (ref 6–23)
Calcium, Ion: 1.22 mmol/L (ref 1.13–1.30)
Chloride: 104 mEq/L (ref 96–112)
Creatinine, Ser: 0.8 mg/dL (ref 0.50–1.10)
GLUCOSE: 86 mg/dL (ref 70–99)
HCT: 40 % (ref 36.0–46.0)
Hemoglobin: 13.6 g/dL (ref 12.0–15.0)
Potassium: 3.6 mEq/L — ABNORMAL LOW (ref 3.7–5.3)
SODIUM: 142 meq/L (ref 137–147)
TCO2: 25 mmol/L (ref 0–100)

## 2013-10-11 SURGERY — RECESSION, MUSCLE, GASTROCNEMIUS
Anesthesia: General | Site: Leg Lower | Laterality: Left

## 2013-10-11 MED ORDER — OXYCODONE HCL 5 MG PO TABS: 5.0000 mg | ORAL_TABLET | ORAL | Status: DC | PRN

## 2013-10-11 MED ORDER — SENNA 8.6 MG PO TABS
1.0000 | ORAL_TABLET | Freq: Two times a day (BID) | ORAL | Status: DC
  Administered 2013-10-11: 8.6 mg via ORAL
  Filled 2013-10-11: qty 1

## 2013-10-11 MED ORDER — LIDOCAINE HCL (CARDIAC) 20 MG/ML IV SOLN
INTRAVENOUS | Status: DC | PRN
  Administered 2013-10-11: 30 mg via INTRAVENOUS

## 2013-10-11 MED ORDER — DOCUSATE SODIUM 100 MG PO CAPS
100.0000 mg | ORAL_CAPSULE | Freq: Two times a day (BID) | ORAL | Status: DC
  Administered 2013-10-11: 100 mg via ORAL
  Filled 2013-10-11: qty 1

## 2013-10-11 MED ORDER — DEXAMETHASONE SODIUM PHOSPHATE 10 MG/ML IJ SOLN
INTRAMUSCULAR | Status: DC | PRN
  Administered 2013-10-11: 10 mg via INTRAVENOUS

## 2013-10-11 MED ORDER — HYDROMORPHONE HCL PF 1 MG/ML IJ SOLN
INTRAMUSCULAR | Status: AC
  Filled 2013-10-11: qty 1

## 2013-10-11 MED ORDER — ONDANSETRON HCL 4 MG PO TABS
4.0000 mg | ORAL_TABLET | Freq: Four times a day (QID) | ORAL | Status: DC | PRN
  Administered 2013-10-12: 4 mg via ORAL
  Filled 2013-10-11: qty 1

## 2013-10-11 MED ORDER — PROPOFOL 10 MG/ML IV BOLUS
INTRAVENOUS | Status: DC | PRN
  Administered 2013-10-11: 160 mg via INTRAVENOUS

## 2013-10-11 MED ORDER — OXYCODONE HCL 5 MG PO TABS: 5.0000 mg | ORAL_TABLET | Freq: Once | ORAL | Status: AC | PRN

## 2013-10-11 MED ORDER — CEFAZOLIN SODIUM-DEXTROSE 2-3 GM-% IV SOLR
INTRAVENOUS | Status: AC
  Filled 2013-10-11: qty 50

## 2013-10-11 MED ORDER — BACITRACIN ZINC 500 UNIT/GM EX OINT
TOPICAL_OINTMENT | CUTANEOUS | Status: DC | PRN
  Administered 2013-10-11: 1 via TOPICAL

## 2013-10-11 MED ORDER — BUPIVACAINE-EPINEPHRINE 0.5% -1:200000 IJ SOLN
INTRAMUSCULAR | Status: DC | PRN
  Administered 2013-10-11: 20 mL

## 2013-10-11 MED ORDER — BUPIVACAINE-EPINEPHRINE (PF) 0.5% -1:200000 IJ SOLN
INTRAMUSCULAR | Status: AC
  Filled 2013-10-11: qty 30

## 2013-10-11 MED ORDER — ONDANSETRON HCL 4 MG/2ML IJ SOLN
4.0000 mg | Freq: Four times a day (QID) | INTRAMUSCULAR | Status: DC | PRN
  Administered 2013-10-11: 4 mg via INTRAVENOUS
  Filled 2013-10-11: qty 2

## 2013-10-11 MED ORDER — BUPIVACAINE-EPINEPHRINE (PF) 0.5% -1:200000 IJ SOLN
INTRAMUSCULAR | Status: DC | PRN
  Administered 2013-10-11: 20 mL

## 2013-10-11 MED ORDER — HYDROMORPHONE HCL PF 1 MG/ML IJ SOLN
0.5000 mg | INTRAMUSCULAR | Status: DC | PRN
  Administered 2013-10-11: 0.5 mg via INTRAVENOUS
  Filled 2013-10-11: qty 1

## 2013-10-11 MED ORDER — ONDANSETRON HCL 4 MG/2ML IJ SOLN: 4.0000 mg | Freq: Once | INTRAMUSCULAR | Status: AC | PRN

## 2013-10-11 MED ORDER — MIDAZOLAM HCL 2 MG/2ML IJ SOLN
INTRAMUSCULAR | Status: AC
  Filled 2013-10-11: qty 2

## 2013-10-11 MED ORDER — 0.9 % SODIUM CHLORIDE (POUR BTL) OPTIME
TOPICAL | Status: DC | PRN
  Administered 2013-10-11: 400 mL

## 2013-10-11 MED ORDER — ENOXAPARIN SODIUM 40 MG/0.4ML ~~LOC~~ SOLN: 40.0000 mg | SUBCUTANEOUS | Status: DC

## 2013-10-11 MED ORDER — FENTANYL CITRATE 0.05 MG/ML IJ SOLN
INTRAMUSCULAR | Status: AC
  Filled 2013-10-11: qty 2

## 2013-10-11 MED ORDER — OXYCODONE HCL 5 MG PO TABS
5.0000 mg | ORAL_TABLET | ORAL | Status: DC | PRN
  Administered 2013-10-11 – 2013-10-12 (×4): 10 mg via ORAL
  Filled 2013-10-11 (×4): qty 2

## 2013-10-11 MED ORDER — SODIUM CHLORIDE 0.9 % IV SOLN
INTRAVENOUS | Status: DC
  Administered 2013-10-11: 13:00:00 via INTRAVENOUS

## 2013-10-11 MED ORDER — CHLORHEXIDINE GLUCONATE 4 % EX LIQD: 60.0000 mL | Freq: Once | CUTANEOUS | Status: DC

## 2013-10-11 MED ORDER — BACITRACIN ZINC 500 UNIT/GM EX OINT
TOPICAL_OINTMENT | CUTANEOUS | Status: AC
  Filled 2013-10-11: qty 28.35

## 2013-10-11 MED ORDER — SODIUM CHLORIDE 0.9 % IV SOLN: INTRAVENOUS | Status: DC

## 2013-10-11 MED ORDER — MIDAZOLAM HCL 2 MG/2ML IJ SOLN
1.0000 mg | INTRAMUSCULAR | Status: DC | PRN
  Administered 2013-10-11: 2 mg via INTRAVENOUS

## 2013-10-11 MED ORDER — OXYCODONE HCL 5 MG/5ML PO SOLN: 5.0000 mg | Freq: Once | ORAL | Status: AC | PRN

## 2013-10-11 MED ORDER — ACETAMINOPHEN 500 MG PO TABS
ORAL_TABLET | ORAL | Status: AC
  Filled 2013-10-11: qty 2

## 2013-10-11 MED ORDER — CEFAZOLIN SODIUM-DEXTROSE 2-3 GM-% IV SOLR
2.0000 g | INTRAVENOUS | Status: AC
  Administered 2013-10-11: 2 g via INTRAVENOUS

## 2013-10-11 MED ORDER — RIVAROXABAN 10 MG PO TABS: 10.0000 mg | ORAL_TABLET | Freq: Every day | ORAL | Status: DC

## 2013-10-11 MED ORDER — ACETAMINOPHEN 500 MG PO TABS
1000.0000 mg | ORAL_TABLET | Freq: Once | ORAL | Status: AC
  Administered 2013-10-11: 1000 mg via ORAL

## 2013-10-11 MED ORDER — FENTANYL CITRATE 0.05 MG/ML IJ SOLN
50.0000 ug | INTRAMUSCULAR | Status: DC | PRN
  Administered 2013-10-11: 100 ug via INTRAVENOUS

## 2013-10-11 MED ORDER — HYDROMORPHONE HCL PF 1 MG/ML IJ SOLN
0.2500 mg | INTRAMUSCULAR | Status: DC | PRN
  Administered 2013-10-11 (×2): 0.5 mg via INTRAVENOUS

## 2013-10-11 MED ORDER — LACTATED RINGERS IV SOLN
INTRAVENOUS | Status: DC
  Administered 2013-10-11: 09:00:00 via INTRAVENOUS
  Administered 2013-10-11: 10 mL/h via INTRAVENOUS
  Administered 2013-10-11: 11:00:00 via INTRAVENOUS

## 2013-10-11 MED ORDER — LEVOTHYROXINE SODIUM 88 MCG PO TABS: 88.0000 ug | ORAL_TABLET | Freq: Every day | ORAL | Status: DC

## 2013-10-11 MED ORDER — ROPIVACAINE HCL 5 MG/ML IJ SOLN
INTRAMUSCULAR | Status: DC | PRN
  Administered 2013-10-11: 20 mL via PERINEURAL

## 2013-10-11 MED ORDER — FENTANYL CITRATE 0.05 MG/ML IJ SOLN
INTRAMUSCULAR | Status: AC
  Filled 2013-10-11: qty 6

## 2013-10-11 MED ORDER — ONDANSETRON HCL 4 MG/2ML IJ SOLN
INTRAMUSCULAR | Status: DC | PRN
  Administered 2013-10-11: 4 mg via INTRAVENOUS

## 2013-10-11 MED ORDER — FENTANYL CITRATE 0.05 MG/ML IJ SOLN
INTRAMUSCULAR | Status: DC | PRN
  Administered 2013-10-11: 25 ug via INTRAVENOUS
  Administered 2013-10-11: 50 ug via INTRAVENOUS
  Administered 2013-10-11: 25 ug via INTRAVENOUS

## 2013-10-11 MED ORDER — ASPIRIN EC 325 MG PO TBEC: 325.0000 mg | DELAYED_RELEASE_TABLET | Freq: Every day | ORAL | Status: DC

## 2013-10-11 SURGICAL SUPPLY — 97 items
BANDAGE ESMARK 6X9 LF (GAUZE/BANDAGES/DRESSINGS) ×2 IMPLANT
BLADE 11 SAFETY STRL DISP (BLADE) IMPLANT
BLADE 15 SAFETY STRL DISP (BLADE) ×4 IMPLANT
BLADE AVERAGE 25X9 (BLADE) IMPLANT
BLADE CCA MICRO SAG (BLADE) IMPLANT
BLADE MICRO SAGITTAL (BLADE) ×3 IMPLANT
BLADE SURG 15 STRL LF DISP TIS (BLADE) IMPLANT
BLADE SURG 15 STRL SS (BLADE) ×9
BNDG CMPR 9X6 STRL LF SNTH (GAUZE/BANDAGES/DRESSINGS) ×2
BNDG COHESIVE 4X5 TAN STRL (GAUZE/BANDAGES/DRESSINGS) ×3 IMPLANT
BNDG COHESIVE 6X5 TAN STRL LF (GAUZE/BANDAGES/DRESSINGS) ×3 IMPLANT
BNDG ESMARK 6X9 LF (GAUZE/BANDAGES/DRESSINGS) ×3
BUR EGG 3PK/BX (BURR) IMPLANT
CANISTER SUCT 1200ML W/VALVE (MISCELLANEOUS) ×3 IMPLANT
CHLORAPREP W/TINT 26ML (MISCELLANEOUS) ×3 IMPLANT
COVER TABLE BACK 60X90 (DRAPES) ×3 IMPLANT
CUFF TOURNIQUET SINGLE 34IN LL (TOURNIQUET CUFF) ×3 IMPLANT
DRAPE C-ARM 42X72 X-RAY (DRAPES) IMPLANT
DRAPE C-ARMOR (DRAPES) IMPLANT
DRAPE EXTREMITY T 121X128X90 (DRAPE) ×3 IMPLANT
DRAPE OEC MINIVIEW 54X84 (DRAPES) ×1 IMPLANT
DRAPE PED LAPAROTOMY (DRAPES) IMPLANT
DRAPE U 20/CS (DRAPES) IMPLANT
DRAPE U-SHAPE 47X51 STRL (DRAPES) ×3 IMPLANT
DRSG ADAPTIC 3X8 NADH LF (GAUZE/BANDAGES/DRESSINGS) ×3 IMPLANT
DRSG EMULSION OIL 3X3 NADH (GAUZE/BANDAGES/DRESSINGS) ×2 IMPLANT
DRSG PAD ABDOMINAL 8X10 ST (GAUZE/BANDAGES/DRESSINGS) ×6 IMPLANT
ELECT REM PT RETURN 9FT ADLT (ELECTROSURGICAL) ×3
ELECTRODE REM PT RTRN 9FT ADLT (ELECTROSURGICAL) ×2 IMPLANT
GAUZE SPONGE 4X4 12PLY STRL (GAUZE/BANDAGES/DRESSINGS) ×3 IMPLANT
GAUZE SPONGE 4X4 16PLY XRAY LF (GAUZE/BANDAGES/DRESSINGS) IMPLANT
GLOVE BIO SURGEON STRL SZ8 (GLOVE) ×3 IMPLANT
GLOVE BIOGEL PI IND STRL 7.0 (GLOVE) IMPLANT
GLOVE BIOGEL PI IND STRL 8 (GLOVE) ×2 IMPLANT
GLOVE BIOGEL PI INDICATOR 7.0 (GLOVE) ×2
GLOVE BIOGEL PI INDICATOR 8 (GLOVE) ×1
GLOVE ECLIPSE 6.5 STRL STRAW (GLOVE) ×2 IMPLANT
GLOVE EXAM NITRILE MD LF STRL (GLOVE) ×1 IMPLANT
GOWN STRL REUS W/ TWL LRG LVL3 (GOWN DISPOSABLE) ×2 IMPLANT
GOWN STRL REUS W/ TWL XL LVL3 (GOWN DISPOSABLE) ×2 IMPLANT
GOWN STRL REUS W/TWL LRG LVL3 (GOWN DISPOSABLE) ×6
GOWN STRL REUS W/TWL XL LVL3 (GOWN DISPOSABLE) ×3
GUIDEWIRE 1.1X6IN (WIRE) ×1 IMPLANT
KIT BIO-TENODESIS 3X8 DISP (MISCELLANEOUS)
KIT INSRT BABSR STRL DISP BTN (MISCELLANEOUS) IMPLANT
NDL HYPO 25X1 1.5 SAFETY (NEEDLE) IMPLANT
NDL SUT 6 .5 CRC .975X.05 MAYO (NEEDLE) IMPLANT
NEEDLE HYPO 22GX1.5 SAFETY (NEEDLE) IMPLANT
NEEDLE HYPO 25X1 1.5 SAFETY (NEEDLE) ×3 IMPLANT
NEEDLE MAYO TAPER (NEEDLE) ×3
NS IRRIG 1000ML POUR BTL (IV SOLUTION) ×3 IMPLANT
PACK BASIN DAY SURGERY FS (CUSTOM PROCEDURE TRAY) ×3 IMPLANT
PAD CAST 4YDX4 CTTN HI CHSV (CAST SUPPLIES) ×4 IMPLANT
PADDING CAST ABS 4INX4YD NS (CAST SUPPLIES)
PADDING CAST ABS COTTON 4X4 ST (CAST SUPPLIES) IMPLANT
PADDING CAST COTTON 4X4 STRL (CAST SUPPLIES) ×6
PADDING CAST COTTON 6X4 STRL (CAST SUPPLIES) ×3 IMPLANT
PENCIL BUTTON HOLSTER BLD 10FT (ELECTRODE) ×3 IMPLANT
PIN GUIDE DRILL TIP 2.8X300 (DRILL) ×1 IMPLANT
SANITIZER HAND PURELL 535ML FO (MISCELLANEOUS) ×3 IMPLANT
SCREW CANN 6.5X40 (Screw) ×1 IMPLANT
SCREW CANN RATTLER 5X20 (Screw) ×1 IMPLANT
SHEET MEDIUM DRAPE 40X70 STRL (DRAPES) ×3 IMPLANT
SLEEVE SCD COMPRESS KNEE MED (MISCELLANEOUS) ×3 IMPLANT
SPLINT FAST PLASTER 5X30 (CAST SUPPLIES) ×20
SPLINT PLASTER CAST FAST 5X30 (CAST SUPPLIES) ×40 IMPLANT
SPONGE LAP 18X18 X RAY DECT (DISPOSABLE) ×3 IMPLANT
STOCKINETTE 6  STRL (DRAPES) ×1
STOCKINETTE 6 STRL (DRAPES) ×2 IMPLANT
STRIP CLOSURE SKIN 1/2X4 (GAUZE/BANDAGES/DRESSINGS) IMPLANT
SUCTION FRAZIER TIP 10 FR DISP (SUCTIONS) ×3 IMPLANT
SUT 2 FIBERLOOP 20 STRT BLUE (SUTURE)
SUT BONE WAX W31G (SUTURE) IMPLANT
SUT ETHIBOND 0 MO6 C/R (SUTURE) ×1 IMPLANT
SUT ETHIBOND 2 OS 4 DA (SUTURE) IMPLANT
SUT ETHILON 3 0 PS 1 (SUTURE) ×4 IMPLANT
SUT FIBERWIRE #2 38 T-5 BLUE (SUTURE)
SUT FIBERWIRE 2-0 18 17.9 3/8 (SUTURE)
SUT MNCRL AB 3-0 PS2 18 (SUTURE) ×3 IMPLANT
SUT PDS AB 0 CT 36 (SUTURE) ×1 IMPLANT
SUT VIC AB 0 SH 27 (SUTURE) ×3 IMPLANT
SUT VIC AB 2-0 PS2 27 (SUTURE) IMPLANT
SUT VIC AB 2-0 SH 18 (SUTURE) IMPLANT
SUT VIC AB 2-0 SH 27 (SUTURE) ×3
SUT VIC AB 2-0 SH 27XBRD (SUTURE) IMPLANT
SUT VIC AB 3-0 PS1 18 (SUTURE)
SUT VIC AB 3-0 PS1 18XBRD (SUTURE) IMPLANT
SUTURE 2 FIBERLOOP 20 STRT BLU (SUTURE) IMPLANT
SUTURE FIBERWR #2 38 T-5 BLUE (SUTURE) IMPLANT
SUTURE FIBERWR 2-0 18 17.9 3/8 (SUTURE) IMPLANT
SYR BULB 3OZ (MISCELLANEOUS) ×3 IMPLANT
SYR CONTROL 10ML LL (SYRINGE) ×1 IMPLANT
TOWEL OR 17X24 6PK STRL BLUE (TOWEL DISPOSABLE) ×4 IMPLANT
TOWEL OR NON WOVEN STRL DISP B (DISPOSABLE) ×2 IMPLANT
TUBE CONNECTING 20X1/4 (TUBING) ×3 IMPLANT
UNDERPAD 30X30 INCONTINENT (UNDERPADS AND DIAPERS) ×3 IMPLANT
YANKAUER SUCT BULB TIP NO VENT (SUCTIONS) IMPLANT

## 2013-10-11 NOTE — Brief Op Note (Signed)
10/11/2013  11:25 AM  PATIENT:  Allayne Stack Labrador  68 y.o. female  PRE-OPERATIVE DIAGNOSIS:  Left flatfoot deformity with posterior tibial tendon dysfunction and a tight heelcord.  POST-OPERATIVE DIAGNOSIS:  same  Procedure(s): LEFT GASTROC RECESSION;  LEFT POSTERIOR TIBIAL TENOLYSIS LEFT CALCANEAL OSTEOTOMY LEFT FLEXOR DIGITORUM LONGUS TENDON TO NAVICULAR TRANSFER AP, lateral and harris heel xrays of the left foot  SURGEON:  Wylene Simmer, MD  ASSISTANT: n/a  ANESTHESIA:   General, regional  EBL:  minimal   TOURNIQUET:   Total Tourniquet Time Documented: Thigh (Left) - 77 minutes Total: Thigh (Left) - 77 minutes   COMPLICATIONS:  None apparent  DISPOSITION:  Extubated, awake and stable to recovery.  DICTATION ID:  [id number]

## 2013-10-11 NOTE — Anesthesia Procedure Notes (Addendum)
Anesthesia Regional Block:  Popliteal block  Pre-Anesthetic Checklist: ,, timeout performed, Correct Patient, Correct Site, Correct Laterality, Correct Procedure, Correct Position, site marked, Risks and benefits discussed,  Surgical consent,  Pre-op evaluation,  At surgeon's request and post-op pain management  Laterality: Left and Lower  Prep: chloraprep       Needles:  Injection technique: Single-shot  Needle Type: Echogenic Needle     Needle Length: 9cm 9 cm Needle Gauge: 21 and 21 G    Additional Needles:  Procedures: ultrasound guided (picture in chart) Popliteal block Narrative:  Start time: 10/11/2013 8:40 AM End time: 10/11/2013 8:36 AM Injection made incrementally with aspirations every 5 mL.  Performed by: Personally  Anesthesiologist: Lorrene Reid, MD   Anesthesia Regional Block:  Adductor canal block  Pre-Anesthetic Checklist: ,, timeout performed, Correct Patient, Correct Site, Correct Laterality, Correct Procedure, Correct Position, site marked, Risks and benefits discussed,  Surgical consent,  Pre-op evaluation,  At surgeon's request and post-op pain management  Laterality: Left and Lower  Prep: chloraprep       Needles:  Injection technique: Single-shot  Needle Type: Echogenic Needle     Needle Length: 9cm 9 cm Needle Gauge: 21 and 21 G    Additional Needles:  Procedures: ultrasound guided (picture in chart) Adductor canal block Narrative:  Start time: 10/11/2013 8:44 AM End time: 10/11/2013 8:47 AM Injection made incrementally with aspirations every 5 mL.  Performed by: Personally  Anesthesiologist: Lorrene Reid, MD   Procedure Name: LMA Insertion Date/Time: 10/11/2013 9:43 AM Performed by: Vanda Waskey Pre-anesthesia Checklist: Patient identified, Emergency Drugs available, Suction available and Patient being monitored Patient Re-evaluated:Patient Re-evaluated prior to inductionOxygen Delivery Method: Circle System  Utilized Preoxygenation: Pre-oxygenation with 100% oxygen Intubation Type: IV induction Ventilation: Mask ventilation without difficulty LMA: LMA inserted LMA Size: 4.0 Number of attempts: 1 Airway Equipment and Method: bite block Placement Confirmation: positive ETCO2 Tube secured with: Tape Dental Injury: Teeth and Oropharynx as per pre-operative assessment

## 2013-10-11 NOTE — H&P (Signed)
Evelyn Orr is an 68 y.o. female.   Chief Complaint:  Left adult flatfoot and posterior tibial tendonitis HPI: 68 y/o female without significant PMH presents today for correction of her tight heelcord and adult flatfoot deformity.  Past Medical History  Diagnosis Date  . Arthritis   . Thyroid disease     hypothyroidism  . Rheumatoid arthritis(714.0)     Past Surgical History  Procedure Laterality Date  . Breast reduction surgery  2001  . Thyroidectomy      age 31  . Wrist arthrodesis  2003    left-rt knee injection  . Shoulder arthroscopy w/ rotator cuff repair  2006    right  . Carpal tunnel release  2006    rt  . Cervical fusion  2009  . Carpal tunnel release      left  . Knee arthroscopy  2004    right  . Colonoscopy      Family History  Problem Relation Age of Onset  . Heart disease Father   . Hypertension Mother   . Breast cancer Paternal Aunt   . Colon cancer Paternal Uncle   . Colon cancer Maternal Uncle    Social History:  reports that she has quit smoking. She has never used smokeless tobacco. She reports that she drinks alcohol. She reports that she does not use illicit drugs.  Allergies: No Known Allergies  Medications Prior to Admission  Medication Sig Dispense Refill  . levothyroxine (SYNTHROID, LEVOTHROID) 88 MCG tablet Take 88 mcg by mouth daily.        . Methotrexate Sodium (METHOTREXATE PO) Take by mouth. Pt takes 8 pills once a week         Results for orders placed during the hospital encounter of 10/22/13 (from the past 48 hour(s))  POCT I-STAT, CHEM 8     Status: Abnormal   Collection Time    Oct 22, 2013  8:24 AM      Result Value Ref Range   Sodium 142  137 - 147 mEq/L   Potassium 3.6 (*) 3.7 - 5.3 mEq/L   Chloride 104  96 - 112 mEq/L   BUN 10  6 - 23 mg/dL   Creatinine, Ser 0.80  0.50 - 1.10 mg/dL   Glucose, Bld 86  70 - 99 mg/dL   Calcium, Ion 1.22  1.13 - 1.30 mmol/L   TCO2 25  0 - 100 mmol/L   Hemoglobin 13.6  12.0 - 15.0  g/dL   HCT 40.0  36.0 - 46.0 %   No results found.  ROS  No recent f/c/n/v/wt loss  Blood pressure 116/67, pulse 80, temperature 98.6 F (37 C), temperature source Oral, resp. rate 20, height 5\' 2"  (1.575 m), weight 74.39 kg (164 lb), SpO2 100.00%. Physical Exam  wn wd woman in nad.  A and O x 4.  Mood and affect normal.  EOMI.  resp unlabored.  L foot is flexible and passively correctable.  Heelcord is tight.  Sens to LT intact throughout the foot.  5/5 strength in PF, DF and eversion.  Inversion is 4/5.  No lymphadenopathy.  Assessment/Plan L adult flatfoot with posterior tibial tendonitis and tight heelcord.  The risks and benefits of the alternative treatment options have been discussed in detail.  The patient wishes to proceed with surgery and specifically understands risks of bleeding, infection, nerve damage, blood clots, need for additional surgery, amputation and death.   Wylene Simmer 10-22-2013, 9:17 AM

## 2013-10-11 NOTE — Anesthesia Preprocedure Evaluation (Signed)
Anesthesia Evaluation  Patient identified by MRN, date of birth, ID band Patient awake    Reviewed: Allergy & Precautions, H&P , NPO status , Patient's Chart, lab work & pertinent test results  Airway Mallampati: I TM Distance: >3 FB Neck ROM: Full    Dental  (+) Teeth Intact, Dental Advisory Given   Pulmonary former smoker,  breath sounds clear to auscultation        Cardiovascular Rhythm:Regular Rate:Normal     Neuro/Psych    GI/Hepatic   Endo/Other    Renal/GU      Musculoskeletal   Abdominal   Peds  Hematology   Anesthesia Other Findings   Reproductive/Obstetrics                           Anesthesia Physical Anesthesia Plan  ASA: II  Anesthesia Plan: General   Post-op Pain Management:    Induction: Intravenous  Airway Management Planned: LMA  Additional Equipment:   Intra-op Plan:   Post-operative Plan: Extubation in OR  Informed Consent: I have reviewed the patients History and Physical, chart, labs and discussed the procedure including the risks, benefits and alternatives for the proposed anesthesia with the patient or authorized representative who has indicated his/her understanding and acceptance.   Dental advisory given  Plan Discussed with: CRNA, Anesthesiologist and Surgeon  Anesthesia Plan Comments:         Anesthesia Quick Evaluation  

## 2013-10-11 NOTE — Transfer of Care (Signed)
Immediate Anesthesia Transfer of Care Note  Patient: Evelyn Orr  Procedure(s) Performed: Procedure(s): LEFT GASTROC RECESSION; LEFT POSTERIOR TIBIAL TENOLYSIS (Left) LEFT CALCANEAL OSTEOTOMY (Left) LEFT FLEXOR DIGITORUM LONGUS TENDON TO NAVICULAR TRANSFER (Left)  Patient Location: PACU  Anesthesia Type:GA combined with regional for post-op pain  Level of Consciousness: awake and patient cooperative  Airway & Oxygen Therapy: Patient Spontanous Breathing and Patient connected to face mask oxygen  Post-op Assessment: Report given to PACU RN and Post -op Vital signs reviewed and stable  Post vital signs: Reviewed and stable  Complications: No apparent anesthesia complications

## 2013-10-11 NOTE — Progress Notes (Signed)
Assisted Dr. Crews with left, ultrasound guided, popliteal/saphenous block. Side rails up, monitors on throughout procedure. See vital signs in flow sheet. Tolerated Procedure well. 

## 2013-10-11 NOTE — Anesthesia Postprocedure Evaluation (Signed)
  Anesthesia Post-op Note  Patient: Allayne Stack Meek  Procedure(s) Performed: Procedure(s): LEFT GASTROC RECESSION; LEFT POSTERIOR TIBIAL TENOLYSIS (Left) LEFT CALCANEAL OSTEOTOMY (Left) LEFT FLEXOR DIGITORUM LONGUS TENDON TO NAVICULAR TRANSFER (Left)  Patient Location: PACU  Anesthesia Type:GA combined with regional for post-op pain  Level of Consciousness: awake, alert  and oriented  Airway and Oxygen Therapy: Patient Spontanous Breathing and Patient connected to face mask oxygen  Post-op Pain: mild  Post-op Assessment: Post-op Vital signs reviewed  Post-op Vital Signs: Reviewed  Last Vitals:  Filed Vitals:   10/11/13 1145  BP: 131/78  Pulse: 83  Temp:   Resp: 14    Complications: No apparent anesthesia complications

## 2013-10-11 NOTE — Discharge Instructions (Signed)
Evelyn Simmer, MD [place]  Please read the following information regarding your care after surgery.  Medications  You only need a prescription for the narcotic pain medicine (ex. oxycodone, Percocet, Norco).  All of the other medicines listed below are available over the counter. X acetominophen (Tylenol) 650 mg every 4-6 hours as you need for minor pain X oxycodone as prescribed for moderate to severe pain  Narcotic pain medicine (ex. oxycodone, Percocet, Vicodin) will cause constipation.  To prevent this problem, take the following medicines while you are taking any pain medicine. X docusate sodium (Colace) 100 mg twice a day X senna (Senokot) 2 tablets twice a day  Weight Bearing ? Bear weight when you are able on your operated leg or foot. ? Bear weight only on the heel of your operated foot in the post-op shoe. X Do not bear any weight on the operated leg or foot.  Cast / Splint / Dressing X Keep your splint or cast clean and dry.  Dont put anything (coat hanger, pencil, etc) down inside of it.  If it gets damp, use a hair dryer on the cool setting to dry it.  If it gets soaked, call the office to schedule an appointment for a cast change. ? Remove your dressing 3 days after surgery and cover the incisions with dry dressings.    After your dressing, cast or splint is removed; you may shower, but do not soak or scrub the wound.  Allow the water to run over it, and then gently pat it dry.  Swelling It is normal for you to have swelling where you had surgery.  To reduce swelling and pain, keep your toes above your nose for at least 3 days after surgery.  It may be necessary to keep your foot or leg elevated for several weeks.  If it hurts, it should be elevated.  Follow Up Call my office at [phone] when you are discharged from the hospital or surgery center to schedule an appointment to be seen two weeks after surgery.  Call my office at [phone] if you develop  a fever >101.5 F, nausea, vomiting, bleeding from the surgical site or severe pain.     Post Anesthesia Home Care Instructions  Activity: Get plenty of rest for the remainder of the day. A responsible adult should stay with you for 24 hours following the procedure.  For the next 24 hours, DO NOT: -Drive a car -Paediatric nurse -Drink alcoholic beverages -Take any medication unless instructed by your physician -Make any legal decisions or sign important papers.  Meals: Start with liquid foods such as gelatin or soup. Progress to regular foods as tolerated. Avoid greasy, spicy, heavy foods. If nausea and/or vomiting occur, drink only clear liquids until the nausea and/or vomiting subsides. Call your physician if vomiting continues.  Special Instructions/Symptoms: Your throat may feel dry or sore from the anesthesia or the breathing tube placed in your throat during surgery. If this causes discomfort, gargle with warm salt water. The discomfort should disappear within 24 hours.   Regional Anesthesia Blocks  1. Numbness or the inability to move the "blocked" extremity may last from 3-48 hours after placement. The length of time depends on the medication injected and your individual response to the medication. If the numbness is not going away after 48 hours, call your surgeon.  2. The extremity that is blocked will need to be protected until the numbness is gone and the  Strength has returned. Because you  cannot feel it, you will need to take extra care to avoid injury. Because it may be weak, you may have difficulty moving it or using it. You may not know what position it is in without looking at it while the block is in effect.  3. For blocks in the legs and feet, returning to weight bearing and walking needs to be done carefully. You will need to wait until the numbness is entirely gone and the strength has returned. You should be able to move your leg and foot normally before you try and  bear weight or walk. You will need someone to be with you when you first try to ensure you do not fall and possibly risk injury.  4. Bruising and tenderness at the needle site are common side effects and will resolve in a few days.  5. Persistent numbness or new problems with movement should be communicated to the surgeon or the [facility] [phone] [place] [phone]).

## 2013-10-12 NOTE — Op Note (Signed)
NAME:  Evelyn Orr, Evelyn Orr            ACCOUNT NO.:  [id number]  MEDICAL RECORD NO.:  [id number]  LOCATION:                                 FACILITY:  PHYSICIAN:  Wylene Simmer, MD        DATE OF BIRTH:  [birth year]  DATE OF PROCEDURE:  10/11/2013 DATE OF DISCHARGE:  10/11/2013                              OPERATIVE REPORT   PREOPERATIVE DIAGNOSIS:  Left flatfoot deformity with posterior tibial tendon dysfunction and a tight heel cord.  POSTOPERATIVE DIAGNOSIS:  Left flatfoot deformity with posterior tibial tendon dysfunction and a tight heel cord.  PROCEDURE: 1. Left gastrocnemius recession. 2. Left posterior tibial tendon tenolysis. 3. Left calcaneal medializing osteotomy. 4. Transfer of the left flexor digitorum longus to the navicular. 5. AP, lateral and Harris heel radiographs of the left foot.  SURGEON:  Wylene Simmer, MD  ANESTHESIA:  General, regional.  ESTIMATED BLOOD LOSS:  Minimal.  TOURNIQUET TIME:  77 minutes at 220 mmHg.  COMPLICATIONS:  None apparent.  DISPOSITION:  Extubated, awake, and stable to recovery.  INDICATIONS FOR PROCEDURE:  The patient is a 68 year old female with an injury to the left foot causing exacerbation of her posterior tibial tendon dysfunction.  She has gone on to develop an adult flatfoot deformity, as well as a tight heel cord.  She presents now for operative treatment of this painful limiting condition.  She has failed nonoperative treatment including bracing, activity modification, oral anti-inflammatories, shoe wear modification.  She understands the risks and benefits, the alternative treatment options and elects surgical treatment.  She specifically understands risks of bleeding, infection, nerve damage, blood clots, need for additional surgery, amputation, and death.  PROCEDURE IN DETAIL:  After preoperative consent was obtained, the correct operative site was identified.  The patient was brought to the operating room and placed  supine on the operating table.  General anesthesia was induced.  Preoperative antibiotics were administered. Surgical time-out was taken.  Left lower extremity was prepped and draped in standard sterile fashion with tourniquet around the thigh. The extremity was exsanguinated and tourniquet was inflated to 220 mmHg. A longitudinal incision was then made over the medial calf.  Sharp dissection was carried down through the skin and subcutaneous tissue. The superficial fascia was incised.  The plantaris tendon was identified and was transected. Gastrocnemius tendon was then identified and was transected from medial to lateral under direct vision taking care to protect the sural nerve posteriorly.  The wound was irrigated.  The ankle wound dorsiflexed approximately 30 degrees with the knee extended. Subcutaneous tissue was closed with Monocryl and the skin was closed with a running nylon suture.  Attention was then turned to the lateral aspect of the hindfoot.  An oblique incision was made over the lateral wall of the calcaneus.  Sharp dissection was carried down through the skin and subcutaneous tissue to the level of periosteum.  The periosteum was elevated.  Retractors were placed plantarly and dorsally and an osteotomy was made across the tuberosity.  Appropriate location of the osteotomy was confirmed on radiographs before the cut was made.  The osteotomy was then mobilized with a lamina spreader.  The tuberosity was translated medially and  pinned provisionally with a guide pin.  A stab incision was made. Lateral and Harris heel radiographs were obtained showing appropriate translation of the tuberosity at appropriate position of the guide pin. A 6.5 mm partially threaded cannulated guide pin from the [employer] set was then selected.  It was inserted over the guide pin and noted to have excellent purchase.  Lateral and Harris heel views confirmed appropriate position of the guide pin.   Both wounds were irrigated copiously. Lateral wall was compressed with a bone impactor to minimize the prominence of the lateral shelf of bone.  The skin incisions were closed with running nylon sutures.  Attention was then turned to the medial ankle, where a curvilinear incision was made over the posterior tibial tendon.  Sharp dissection was carried down through the skin and subcutaneous tissue.  The posterior tibial tendon sheath was opened.  There was extensive tenosynovitis along the course of the posterior tibial tendon.  This was all debrided sharply with a knife and scissors.  The flexor digitorum longus tendon was identified.  It was traced distally to the insertion at the knot of [place].  It was released in a slight location and the end of the suture was tagged with a 0 Ethibond.  The dorsal and plantar aspect of the navicular was then exposed with subperiosteal dissection. A K-wire was inserted into the navicular tuberosity and appropriate location was confirmed on fluoroscopic images.  The FDL tendon was measured and a 5.2 mm drill bit was inserted over the guide pin.  It was used to drill a hole in the navicular.  The FDL was then pulled up from plantar to dorsal through this previously drilled hole.  A 5 mm x 20 mm rattler screw was then inserted fixing the FDL tendon securely.  The free ends of suture were then repaired to the adjacent periosteum with horizontal mattress sutures.  The flexor digitorum longus tendon was then repaired in side-to-side fashion to the posterior tibial tendon, with horizontal mattress sutures of 0 Vicryl.  The wound was irrigated. The tendon sheath was repaired over the tendon with simple sutures of 0 Vicryl.  Subcutaneous tissue was closed with Monocryl and the skin was closed with a running nylon.  Sterile dressings were applied followed by a well-padded short-leg splint.  Tourniquet was released at 1 hour and 17 minutes.  The patient was  awakened from anesthesia and transported to the recovery room in stable condition.  FOLLOWUP PLAN:  The patient will be nonweightbearing on the left lower extremity.  She will follow up with me in 2 weeks for suture removal, conversion to a cast.  She will take aspirin daily for DVT prophylaxis.  RADIOGRAPHS:  Lateral Harris heel and oblique radiographs of the foot were obtained intraoperatively.  These show interval correction of the flatfoot deformity with calcaneal osteotomy and placement of an interference screw in the navicular.  No other fracture, dislocation or malalignment is noted.     Wylene Simmer, MD   ______________________________ Wylene Simmer, MD    JH/MEDQ  D:  10/11/2013  T:  10/12/2013  Job:  [id number]

## 2013-10-15 ENCOUNTER — Encounter (HOSPITAL_BASED_OUTPATIENT_CLINIC_OR_DEPARTMENT_OTHER): Payer: Self-pay | Admitting: Orthopedic Surgery

## 2014-01-30 ENCOUNTER — Encounter: Payer: Self-pay | Admitting: Internal Medicine

## 2014-02-05 ENCOUNTER — Encounter: Payer: Self-pay | Admitting: Internal Medicine

## 2014-02-12 ENCOUNTER — Encounter (HOSPITAL_BASED_OUTPATIENT_CLINIC_OR_DEPARTMENT_OTHER): Payer: Self-pay | Admitting: *Deleted

## 2014-02-13 ENCOUNTER — Other Ambulatory Visit: Payer: Self-pay | Admitting: Physician Assistant

## 2014-02-14 ENCOUNTER — Encounter (HOSPITAL_BASED_OUTPATIENT_CLINIC_OR_DEPARTMENT_OTHER)
Admission: RE | Disposition: A | Discharge status: Home / Self Care | Payer: Self-pay | Source: Ambulatory Visit | Attending: Orthopedic Surgery

## 2014-02-14 ENCOUNTER — Ambulatory Visit (HOSPITAL_BASED_OUTPATIENT_CLINIC_OR_DEPARTMENT_OTHER): Payer: Worker's Compensation | Admitting: Anesthesiology

## 2014-02-14 ENCOUNTER — Ambulatory Visit (HOSPITAL_BASED_OUTPATIENT_CLINIC_OR_DEPARTMENT_OTHER)
Admission: RE | Admit: 2014-02-14 | Discharge: 2014-02-14 | Disposition: A | Discharge status: Home / Self Care | Payer: Worker's Compensation | Source: Ambulatory Visit | Attending: Orthopedic Surgery | Admitting: Orthopedic Surgery

## 2014-02-14 ENCOUNTER — Encounter (HOSPITAL_BASED_OUTPATIENT_CLINIC_OR_DEPARTMENT_OTHER): Payer: Self-pay | Admitting: *Deleted

## 2014-02-14 ENCOUNTER — Encounter (HOSPITAL_BASED_OUTPATIENT_CLINIC_OR_DEPARTMENT_OTHER): Payer: Worker's Compensation | Admitting: Anesthesiology

## 2014-02-14 DIAGNOSIS — M069 Rheumatoid arthritis, unspecified: Secondary | ICD-10-CM | POA: Insufficient documentation

## 2014-02-14 DIAGNOSIS — Y838 Other surgical procedures as the cause of abnormal reaction of the patient, or of later complication, without mention of misadventure at the time of the procedure: Secondary | ICD-10-CM | POA: Insufficient documentation

## 2014-02-14 DIAGNOSIS — Z79899 Other long term (current) drug therapy: Secondary | ICD-10-CM | POA: Diagnosis not present

## 2014-02-14 DIAGNOSIS — M129 Arthropathy, unspecified: Secondary | ICD-10-CM | POA: Diagnosis not present

## 2014-02-14 DIAGNOSIS — T8489XA Other specified complication of internal orthopedic prosthetic devices, implants and grafts, initial encounter: Secondary | ICD-10-CM | POA: Diagnosis present

## 2014-02-14 DIAGNOSIS — Z87891 Personal history of nicotine dependence: Secondary | ICD-10-CM | POA: Insufficient documentation

## 2014-02-14 DIAGNOSIS — T8484XS Pain due to internal orthopedic prosthetic devices, implants and grafts, sequela: Secondary | ICD-10-CM

## 2014-02-14 DIAGNOSIS — E039 Hypothyroidism, unspecified: Secondary | ICD-10-CM | POA: Insufficient documentation

## 2014-02-14 DIAGNOSIS — M79609 Pain in unspecified limb: Secondary | ICD-10-CM | POA: Diagnosis not present

## 2014-02-14 HISTORY — PX: REMOVAL OF IMPLANT: SHX6451

## 2014-02-14 HISTORY — DX: Nausea with vomiting, unspecified: R11.2

## 2014-02-14 HISTORY — DX: Other specified postprocedural states: Z98.890

## 2014-02-14 SURGERY — REMOVAL OF IMPLANT
Anesthesia: Monitor Anesthesia Care | Site: Foot | Laterality: Left

## 2014-02-14 MED ORDER — HYDROCODONE-ACETAMINOPHEN 5-325 MG PO TABS: 1.0000 | ORAL_TABLET | Freq: Four times a day (QID) | ORAL | Status: DC | PRN

## 2014-02-14 MED ORDER — PROPOFOL 10 MG/ML IV BOLUS
INTRAVENOUS | Status: DC | PRN
  Administered 2014-02-14 (×2): 20 mg via INTRAVENOUS

## 2014-02-14 MED ORDER — FENTANYL CITRATE 0.05 MG/ML IJ SOLN: 50.0000 ug | INTRAMUSCULAR | Status: DC | PRN

## 2014-02-14 MED ORDER — MIDAZOLAM HCL 2 MG/2ML IJ SOLN
INTRAMUSCULAR | Status: AC
  Filled 2014-02-14: qty 2

## 2014-02-14 MED ORDER — ACETAMINOPHEN 500 MG PO TABS
ORAL_TABLET | ORAL | Status: AC
  Filled 2014-02-14: qty 2

## 2014-02-14 MED ORDER — FENTANYL CITRATE 0.05 MG/ML IJ SOLN
INTRAMUSCULAR | Status: DC | PRN
  Administered 2014-02-14: 50 ug via INTRAVENOUS

## 2014-02-14 MED ORDER — CHLORHEXIDINE GLUCONATE 4 % EX LIQD: 60.0000 mL | Freq: Once | CUTANEOUS | Status: DC

## 2014-02-14 MED ORDER — MIDAZOLAM HCL 5 MG/5ML IJ SOLN
INTRAMUSCULAR | Status: DC | PRN
  Administered 2014-02-14: 1 mg via INTRAVENOUS

## 2014-02-14 MED ORDER — OXYCODONE HCL 5 MG PO TABS: 5.0000 mg | ORAL_TABLET | Freq: Once | ORAL | Status: DC | PRN

## 2014-02-14 MED ORDER — CEFAZOLIN SODIUM-DEXTROSE 2-3 GM-% IV SOLR
INTRAVENOUS | Status: AC
  Filled 2014-02-14: qty 50

## 2014-02-14 MED ORDER — LACTATED RINGERS IV SOLN
INTRAVENOUS | Status: DC
Start: 2014-02-14 — End: 2014-02-14
  Administered 2014-02-14: 09:00:00 via INTRAVENOUS

## 2014-02-14 MED ORDER — FENTANYL CITRATE 0.05 MG/ML IJ SOLN
INTRAMUSCULAR | Status: AC
  Filled 2014-02-14: qty 4

## 2014-02-14 MED ORDER — SODIUM CHLORIDE 0.9 % IV SOLN: INTRAVENOUS | Status: DC

## 2014-02-14 MED ORDER — OXYCODONE HCL 5 MG/5ML PO SOLN: 5.0000 mg | Freq: Once | ORAL | Status: DC | PRN

## 2014-02-14 MED ORDER — 0.9 % SODIUM CHLORIDE (POUR BTL) OPTIME
TOPICAL | Status: DC | PRN
  Administered 2014-02-14: 100 mL

## 2014-02-14 MED ORDER — FENTANYL CITRATE 0.05 MG/ML IJ SOLN: 25.0000 ug | INTRAMUSCULAR | Status: DC | PRN

## 2014-02-14 MED ORDER — ONDANSETRON HCL 4 MG/2ML IJ SOLN: 4.0000 mg | Freq: Four times a day (QID) | INTRAMUSCULAR | Status: DC | PRN

## 2014-02-14 MED ORDER — ONDANSETRON HCL 4 MG/2ML IJ SOLN
INTRAMUSCULAR | Status: DC | PRN
  Administered 2014-02-14: 4 mg via INTRAVENOUS

## 2014-02-14 MED ORDER — CEFAZOLIN SODIUM-DEXTROSE 2-3 GM-% IV SOLR
2.0000 g | INTRAVENOUS | Status: AC
  Administered 2014-02-14: 2 g via INTRAVENOUS

## 2014-02-14 MED ORDER — BUPIVACAINE-EPINEPHRINE 0.5% -1:200000 IJ SOLN
INTRAMUSCULAR | Status: DC | PRN
  Administered 2014-02-14: 5 mL

## 2014-02-14 MED ORDER — ACETAMINOPHEN 500 MG PO TABS
1000.0000 mg | ORAL_TABLET | Freq: Once | ORAL | Status: AC
  Administered 2014-02-14: 1000 mg via ORAL

## 2014-02-14 MED ORDER — MIDAZOLAM HCL 2 MG/2ML IJ SOLN: 1.0000 mg | INTRAMUSCULAR | Status: DC | PRN

## 2014-02-14 SURGICAL SUPPLY — 71 items
BAG DECANTER FOR FLEXI CONT (MISCELLANEOUS) IMPLANT
BANDAGE ELASTIC 4 VELCRO ST LF (GAUZE/BANDAGES/DRESSINGS) IMPLANT
BANDAGE ESMARK 6X9 LF (GAUZE/BANDAGES/DRESSINGS) IMPLANT
BLADE SURG 15 STRL LF DISP TIS (BLADE) ×2 IMPLANT
BLADE SURG 15 STRL SS (BLADE) ×2
BNDG CMPR 9X4 STRL LF SNTH (GAUZE/BANDAGES/DRESSINGS) ×1
BNDG CMPR 9X6 STRL LF SNTH (GAUZE/BANDAGES/DRESSINGS)
BNDG COHESIVE 4X5 TAN STRL (GAUZE/BANDAGES/DRESSINGS) ×1 IMPLANT
BNDG COHESIVE 6X5 TAN STRL LF (GAUZE/BANDAGES/DRESSINGS) IMPLANT
BNDG ESMARK 4X9 LF (GAUZE/BANDAGES/DRESSINGS) ×1 IMPLANT
BNDG ESMARK 6X9 LF (GAUZE/BANDAGES/DRESSINGS)
CHLORAPREP W/TINT 26ML (MISCELLANEOUS) ×2 IMPLANT
COVER TABLE BACK 60X90 (DRAPES) ×2 IMPLANT
CUFF TOURNIQUET SINGLE 34IN LL (TOURNIQUET CUFF) IMPLANT
DECANTER SPIKE VIAL GLASS SM (MISCELLANEOUS) IMPLANT
DRAPE EXTREMITY TIBURON (DRAPES) ×2 IMPLANT
DRAPE OEC MINIVIEW 54X84 (DRAPES) ×1 IMPLANT
DRAPE SURG 17X23 STRL (DRAPES) ×1 IMPLANT
DRAPE U-SHAPE 47X51 STRL (DRAPES) ×1 IMPLANT
DRSG EMULSION OIL 3X3 NADH (GAUZE/BANDAGES/DRESSINGS) ×2 IMPLANT
DRSG PAD ABDOMINAL 8X10 ST (GAUZE/BANDAGES/DRESSINGS) IMPLANT
DRSG TEGADERM 4X4.75 (GAUZE/BANDAGES/DRESSINGS) ×1 IMPLANT
ELECT REM PT RETURN 9FT ADLT (ELECTROSURGICAL) ×2
ELECTRODE REM PT RTRN 9FT ADLT (ELECTROSURGICAL) ×1 IMPLANT
GAUZE SPONGE 4X4 12PLY STRL (GAUZE/BANDAGES/DRESSINGS) ×2 IMPLANT
GLOVE BIO SURGEON STRL SZ7 (GLOVE) ×1 IMPLANT
GLOVE BIO SURGEON STRL SZ8 (GLOVE) ×2 IMPLANT
GLOVE BIOGEL PI IND STRL 7.0 (GLOVE) ×1 IMPLANT
GLOVE BIOGEL PI IND STRL 8 (GLOVE) ×1 IMPLANT
GLOVE BIOGEL PI INDICATOR 7.0 (GLOVE) ×1
GLOVE BIOGEL PI INDICATOR 8 (GLOVE) ×1
GLOVE ECLIPSE 7.0 STRL STRAW (GLOVE) ×1 IMPLANT
GLOVE EXAM NITRILE MD LF STRL (GLOVE) ×1 IMPLANT
GOWN STRL REUS W/ TWL LRG LVL3 (GOWN DISPOSABLE) ×2 IMPLANT
GOWN STRL REUS W/ TWL XL LVL3 (GOWN DISPOSABLE) ×1 IMPLANT
GOWN STRL REUS W/TWL LRG LVL3 (GOWN DISPOSABLE) ×2
GOWN STRL REUS W/TWL XL LVL3 (GOWN DISPOSABLE) ×2
NDL HYPO 25X1 1.5 SAFETY (NEEDLE) IMPLANT
NEEDLE HYPO 22GX1.5 SAFETY (NEEDLE) ×1 IMPLANT
NEEDLE HYPO 25X1 1.5 SAFETY (NEEDLE) IMPLANT
PACK BASIN DAY SURGERY FS (CUSTOM PROCEDURE TRAY) ×2 IMPLANT
PAD CAST 4YDX4 CTTN HI CHSV (CAST SUPPLIES) ×1 IMPLANT
PADDING CAST ABS 4INX4YD NS (CAST SUPPLIES)
PADDING CAST ABS COTTON 4X4 ST (CAST SUPPLIES) IMPLANT
PADDING CAST COTTON 4X4 STRL (CAST SUPPLIES) ×2
PADDING CAST COTTON 6X4 STRL (CAST SUPPLIES) IMPLANT
PENCIL BUTTON HOLSTER BLD 10FT (ELECTRODE) ×1 IMPLANT
PIN GUIDE DRILL TIP 2.8X300 (DRILL) ×1 IMPLANT
SANITIZER HAND PURELL 535ML FO (MISCELLANEOUS) ×2 IMPLANT
SHEET MEDIUM DRAPE 40X70 STRL (DRAPES) ×2 IMPLANT
SLEEVE SCD COMPRESS KNEE MED (MISCELLANEOUS) ×1 IMPLANT
SPLINT FAST PLASTER 5X30 (CAST SUPPLIES)
SPLINT PLASTER CAST FAST 5X30 (CAST SUPPLIES) IMPLANT
SPONGE LAP 18X18 X RAY DECT (DISPOSABLE) ×2 IMPLANT
STAPLER VISISTAT 35W (STAPLE) IMPLANT
STOCKINETTE 6  STRL (DRAPES) ×1
STOCKINETTE 6 STRL (DRAPES) ×1 IMPLANT
STRIP CLOSURE SKIN 1/2X4 (GAUZE/BANDAGES/DRESSINGS) IMPLANT
SUCTION FRAZIER TIP 10 FR DISP (SUCTIONS) ×1 IMPLANT
SUT ETHILON 3 0 PS 1 (SUTURE) ×1 IMPLANT
SUT MNCRL AB 3-0 PS2 18 (SUTURE) IMPLANT
SUT VIC AB 0 SH 27 (SUTURE) IMPLANT
SUT VIC AB 2-0 SH 27 (SUTURE)
SUT VIC AB 2-0 SH 27XBRD (SUTURE) IMPLANT
SUT VICRYL 4-0 PS2 18IN ABS (SUTURE) IMPLANT
SYR BULB 3OZ (MISCELLANEOUS) ×2 IMPLANT
SYR CONTROL 10ML LL (SYRINGE) ×1 IMPLANT
TOWEL OR 17X24 6PK STRL BLUE (TOWEL DISPOSABLE) ×2 IMPLANT
TOWEL OR NON WOVEN STRL DISP B (DISPOSABLE) ×1 IMPLANT
TUBE CONNECTING 20X1/4 (TUBING) IMPLANT
UNDERPAD 30X30 INCONTINENT (UNDERPADS AND DIAPERS) ×2 IMPLANT

## 2014-02-14 NOTE — Discharge Instructions (Signed)
Wylene Simmer, MD [place]  Please read the following information regarding your care after surgery.  Medications  You only need a prescription for the narcotic pain medicine (ex. oxycodone, Percocet, Norco).  All of the other medicines listed below are available over the counter. X acetominophen (Tylenol) 650 mg every 4-6 hours as you need for minor pain OR norco as prescribed for pain  Narcotic pain medicine (ex. oxycodone, Percocet, Vicodin) will cause constipation.  To prevent this problem, take the following medicines while you are taking any pain medicine. X docusate sodium (Colace) 100 mg twice a day X senna (Senokot) 2 tablets twice a day  Weight Bearing X Bear weight when you are able on your operated leg or foot. ? Bear weight only on the heel of your operated foot in the post-op shoe. ? Do not bear any weight on the operated leg or foot.  Cast / Splint / Dressing ? Keep your splint or cast clean and dry.  Dont put anything (coat hanger, pencil, etc) down inside of it.  If it gets damp, use a hair dryer on the cool setting to dry it.  If it gets soaked, call the office to schedule an appointment for a cast change. X Remove your dressing 3 days after surgery and cover the incisions with dry dressings.    After your dressing, cast or splint is removed; you may shower, but do not soak or scrub the wound.  Allow the water to run over it, and then gently pat it dry.  Swelling It is normal for you to have swelling where you had surgery.  To reduce swelling and pain, keep your toes above your nose for at least 3 days after surgery.  It may be necessary to keep your foot or leg elevated for several weeks.  If it hurts, it should be elevated.  Follow Up Call my office at [phone] when you are discharged from the hospital or surgery center to schedule an appointment to be seen two weeks after surgery.  Call my office at [phone] if you develop a fever >101.5 F,  nausea, vomiting, bleeding from the surgical site or severe pain.     Post Anesthesia Home Care Instructions  Activity: Get plenty of rest for the remainder of the day. A responsible adult should stay with you for 24 hours following the procedure.  For the next 24 hours, DO NOT: -Drive a car -Paediatric nurse -Drink alcoholic beverages -Take any medication unless instructed by your physician -Make any legal decisions or sign important papers.  Meals: Start with liquid foods such as gelatin or soup. Progress to regular foods as tolerated. Avoid greasy, spicy, heavy foods. If nausea and/or vomiting occur, drink only clear liquids until the nausea and/or vomiting subsides. Call your physician if vomiting continues.  Special Instructions/Symptoms: Your throat may feel dry or sore from the anesthesia or the breathing tube placed in your throat during surgery. If this causes discomfort, gargle with warm salt water. The discomfort should disappear within 24 hours.

## 2014-02-14 NOTE — Brief Op Note (Signed)
02/14/2014  10:19 AM  PATIENT:  Evelyn Orr  68 y.o. female  PRE-OPERATIVE DIAGNOSIS:  left foot painful hardware  POST-OPERATIVE DIAGNOSIS:  Same  Procedure(s): LEFT FOOT REMOVAL OF DEEP IMPLANT  SURGEON:  Wylene Simmer, MD  ASSISTANT: n/a  ANESTHESIA:   Local, MAC  EBL:  minimal   TOURNIQUET:  n/a  COMPLICATIONS:  None apparent  DISPOSITION:  Extubated, awake and stable to recovery.  DICTATION ID:  [id number]

## 2014-02-14 NOTE — Anesthesia Procedure Notes (Signed)
Procedure Name: MAC Date/Time: 02/14/2014 9:44 AM Performed by: Baxter Flattery Pre-anesthesia Checklist: Patient identified, Emergency Drugs available, Suction available and Patient being monitored Patient Re-evaluated:Patient Re-evaluated prior to inductionOxygen Delivery Method: Simple face mask Preoxygenation: Pre-oxygenation with 100% oxygen Intubation Type: IV induction Dental Injury: Teeth and Oropharynx as per pre-operative assessment

## 2014-02-14 NOTE — H&P (Signed)
Evelyn Orr is an 68 y.o. female.   Chief Complaint: left heel pain HPI:  68 y/o female with left heel pain from a prominent screw s/p left flatfoot correction.  She presents today for removal of deep implant from the left heel.  Past Medical History  Diagnosis Date  . Arthritis   . Thyroid disease     hypothyroidism  . Rheumatoid arthritis(714.0)   . PONV (postoperative nausea and vomiting)     Past Surgical History  Procedure Laterality Date  . Breast reduction surgery  2001  . Thyroidectomy      age 80  . Wrist arthrodesis  2003    left-rt knee injection  . Shoulder arthroscopy w/ rotator cuff repair  2006    right  . Carpal tunnel release  2006    rt  . Cervical fusion  2009  . Carpal tunnel release      left  . Knee arthroscopy  2004    right  . Colonoscopy    . Gastrocnemius recession Left 10/11/2013    Procedure: LEFT GASTROC RECESSION; LEFT POSTERIOR TIBIAL TENOLYSIS;  Surgeon: Wylene Simmer, MD;  Location: [facility];  Service: Orthopedics;  Laterality: Left;  . Calcaneal osteotomy Left 10/11/2013    Procedure: LEFT CALCANEAL OSTEOTOMY;  Surgeon: Wylene Simmer, MD;  Location: [facility];  Service: Orthopedics;  Laterality: Left;    Family History  Problem Relation Age of Onset  . Heart disease Father   . Hypertension Mother   . Breast cancer Paternal Aunt   . Colon cancer Paternal Uncle   . Colon cancer Maternal Uncle    Social History:  reports that she quit smoking about 30 years ago. She has never used smokeless tobacco. She reports that she does not drink alcohol or use illicit drugs.  Allergies: No Known Allergies  Medications Prior to Admission  Medication Sig Dispense Refill  . levothyroxine (SYNTHROID, LEVOTHROID) 88 MCG tablet Take 88 mcg by mouth daily.        . Methotrexate Sodium (METHOTREXATE PO) Take by mouth. Pt takes 8 pills once a week         No results found for this or any previous visit (from the past  33 hour(s)). No results found.  ROS  No recent f/c/n/v/wt loss.  Blood pressure 139/77, pulse 63, temperature 97.8 F (36.6 C), temperature source Oral, resp. rate 20, height 5\' 2"  (1.575 m), weight 74.844 kg (165 lb), SpO2 98.00%. Physical Exam  wn wd woman in nad.  A and O x 4.  Mood and affect normal.  EOMI but exophthalmos present.  resp unlabored.  L foot with healed posterior heel incision.  No signs of infection.  Skin o/w healthy.  5/5 strength in PF.  TTP at posterior heel.  Assessment/Plan L heel painful hardware - to OR for removal of deep implant.  The risks and benefits of the alternative treatment options have been discussed in detail.  The patient wishes to proceed with surgery and specifically understands risks of bleeding, infection, nerve damage, blood clots, need for additional surgery, amputation and death.   Wylene Simmer 02/23/14, 9:31 AM

## 2014-02-14 NOTE — Anesthesia Postprocedure Evaluation (Signed)
Anesthesia Post Note  Patient: Evelyn Orr  Procedure(s) Performed: Procedure(s) (LRB): LEFT FOOT REMOVAL OF DEEP IMPLANT (Left)  Anesthesia type: MAC  Patient location: PACU  Post pain: Pain level controlled and Adequate analgesia  Post assessment: Post-op Vital signs reviewed, Patient's Cardiovascular Status Stable and Respiratory Function Stable  Last Vitals:  Filed Vitals:   02/14/14 1118  BP: 141/78  Pulse: 66  Temp: 36.5 C  Resp: 20    Post vital signs: Reviewed and stable  Level of consciousness: awake, alert  and oriented  Complications: No apparent anesthesia complications

## 2014-02-14 NOTE — Op Note (Signed)
NAME:  Evelyn Orr, Evelyn Orr            ACCOUNT NO.:  [id number]  MEDICAL RECORD NO.:  [id number]  LOCATION:                                 FACILITY:  PHYSICIAN:  Wylene Simmer, MD        DATE OF BIRTH:  17-Jan-1946  DATE OF PROCEDURE:  02/14/2014 DATE OF DISCHARGE:  02/14/2014                              OPERATIVE REPORT   PREOPERATIVE DIAGNOSIS:  Painful hardware, left heel.  POSTOPERATIVE DIAGNOSIS:  Painful hardware, left heel.  PROCEDURE:  Removal of deep implant from the left heel.  SURGEON:  Wylene Simmer, MD  ANESTHESIA:  Local, MAC.  ESTIMATED BLOOD LOSS:  Minimal.  TOURNIQUET TIME:  Zero.  COMPLICATIONS:  None apparent.  DISPOSITION:  Extubated, awake, and stable to recovery.  INDICATIONS FOR PROCEDURE:  The patient is a 68 year old woman who is now several months status post flat foot reconstruction with calcaneal osteotomy.  She has had some subsidence at the calcaneal tuberosity and the screw is now quite prominent.  She presents for removal of this deep implant.  She understands the risks and benefits, the alternative treatment options, and elects surgical treatment.  She specifically understands risks of bleeding, infection, nerve damage, blood clots, need for additional surgery, continued pain, amputation, and death.  PROCEDURE IN DETAIL:  After preoperative consent was obtained and the correct operative site was identified, the patient was brought to the operating room and placed supine on the operating table.  IV sedation was administered.  Local anesthetic was infiltrated into the skin at the posterior aspect of the heel.  Surgical time-out was taken.  The left lower extremity was prepped and draped in standard sterile fashion.  The patient's previous operative site of the posterior aspect of the heel was identified.  The incision was once again opened with a #15 blade. Guidepin for the cannulated screw was inserted through the incision and the screw head  was identified.  Screwdriver was then used to remove the screw head in its entirety.  Lateral radiograph was then obtained showing complete removal of the screw.  Wound was irrigated and closed with horizontal mattress suture of 3-0 nylon.  Sterile dressing was applied followed by compression wrap.  The patient was awakened by Anesthesia and transported to the recovery room in stable condition.  FOLLOWUP PLAN:  The patient will be weightbearing as tolerated on her left foot.  She will follow up with me in 2 weeks for suture removal.     Wylene Simmer, MD     JH/MEDQ  D:  02/14/2014  T:  02/14/2014  Job:  [id number]

## 2014-02-14 NOTE — Transfer of Care (Signed)
Immediate Anesthesia Transfer of Care Note  Patient: Evelyn Orr  Procedure(s) Performed: Procedure(s): LEFT FOOT REMOVAL OF DEEP IMPLANT (Left)  Patient Location: PACU  Anesthesia Type:MAC  Level of Consciousness: awake, alert  and oriented  Airway & Oxygen Therapy: Patient Spontanous Breathing and Patient connected to face mask oxygen  Post-op Assessment: Report given to PACU RN, Post -op Vital signs reviewed and stable and Patient moving all extremities  Post vital signs: Reviewed and stable  Complications: No apparent anesthesia complications

## 2014-02-14 NOTE — Anesthesia Preprocedure Evaluation (Signed)
Anesthesia Evaluation  Patient identified by MRN, date of birth, ID band Patient awake    Reviewed: Allergy & Precautions, H&P , NPO status , Patient's Chart, lab work & pertinent test results  History of Anesthesia Complications (+) PONV and history of anesthetic complications  Airway Mallampati: II  Neck ROM: full    Dental   Pulmonary former smoker,          Cardiovascular negative cardio ROS      Neuro/Psych    GI/Hepatic   Endo/Other  obese  Renal/GU      Musculoskeletal  (+) Arthritis -,   Abdominal   Peds  Hematology   Anesthesia Other Findings   Reproductive/Obstetrics                           Anesthesia Physical Anesthesia Plan  ASA: II  Anesthesia Plan: MAC   Post-op Pain Management:    Induction: Intravenous  Airway Management Planned: Simple Face Mask  Additional Equipment:   Intra-op Plan:   Post-operative Plan:   Informed Consent: I have reviewed the patients History and Physical, chart, labs and discussed the procedure including the risks, benefits and alternatives for the proposed anesthesia with the patient or authorized representative who has indicated his/her understanding and acceptance.     Plan Discussed with: CRNA, Anesthesiologist and Surgeon  Anesthesia Plan Comments:         Anesthesia Quick Evaluation

## 2014-02-15 ENCOUNTER — Encounter (HOSPITAL_BASED_OUTPATIENT_CLINIC_OR_DEPARTMENT_OTHER): Payer: Self-pay | Admitting: Orthopedic Surgery

## 2014-03-19 ENCOUNTER — Ambulatory Visit (AMBULATORY_SURGERY_CENTER): Payer: BC Managed Care – PPO | Admitting: *Deleted

## 2014-03-19 VITALS — Ht 62.0 in | Wt 166.8 lb

## 2014-03-19 DIAGNOSIS — Z86010 Personal history of colonic polyps: Secondary | ICD-10-CM

## 2014-03-19 MED ORDER — MOVIPREP 100 G PO SOLR: 1.0000 | Freq: Once | ORAL | Status: DC

## 2014-03-19 NOTE — Progress Notes (Signed)
No egg or soy allergy. ewm No home 02 or c pap use. ewm No diet pills. ewm No problems with past sedation. ewm Pt declined emmi video. ewm

## 2014-03-26 ENCOUNTER — Encounter: Payer: Self-pay | Admitting: Internal Medicine

## 2014-04-02 ENCOUNTER — Ambulatory Visit (AMBULATORY_SURGERY_CENTER): Payer: Medicare Other | Admitting: Internal Medicine

## 2014-04-02 ENCOUNTER — Encounter: Payer: Self-pay | Admitting: Internal Medicine

## 2014-04-02 VITALS — BP 101/56 | HR 69 | Temp 98.8°F | Resp 20 | Ht 62.0 in | Wt 166.0 lb

## 2014-04-02 DIAGNOSIS — Z86010 Personal history of colonic polyps: Secondary | ICD-10-CM | POA: Diagnosis present

## 2014-04-02 DIAGNOSIS — D124 Benign neoplasm of descending colon: Secondary | ICD-10-CM | POA: Diagnosis not present

## 2014-04-02 MED ORDER — SODIUM CHLORIDE 0.9 % IV SOLN
500.0000 mL | INTRAVENOUS | Status: DC
Start: 2014-04-02 — End: 2014-04-02

## 2014-04-02 NOTE — Op Note (Signed)
[place]  [address] [place], [address]   COLONOSCOPY PROCEDURE REPORT  PATIENT: Evelyn Orr, Evelyn Orr  MR#: [id number] BIRTHDATE: 1945-07-31 , 68  yrs. old GENDER: female ENDOSCOPIST: Lafayette Dragon, MD REFERRED [id number] [facility] Cancer, M.D. PROCEDURE DATE:  04/02/2014 PROCEDURE:   Colonoscopy with cold biopsy polypectomy First Screening Colonoscopy - Avg.  risk and is 50 yrs.  old or older - No.  Prior Negative Screening - Now for repeat screening. N/A  History of Adenoma - Now for follow-up colonoscopy & has been > or = to 3 yrs.  Yes hx of adenoma.  Has been 3 or more years since last colonoscopy.  Polyps Removed Today? Yes. ASA CLASS:   Class II INDICATIONS:tubular adenoma removed in 2005.  Last colonoscopy in November 2008 was normal. MEDICATIONS: Monitored anesthesia care and Propofol 200 mg IV  DESCRIPTION OF PROCEDURE:   After the risks benefits and alternatives of the procedure were thoroughly explained, informed consent was obtained.  The digital rectal exam revealed no abnormalities of the rectum.   The LB [id number]  endoscope was introduced through the anus and advanced to the cecum, which was identified by both the appendix and ileocecal valve. No adverse events experienced.   The quality of the prep was good, using MoviPrep  The instrument was then slowly withdrawn as the colon was fully examined.      COLON FINDINGS: A smooth sessile polyp measuring 3 mm in size was found in the descending colon.  A polypectomy was performed with cold forceps.  The resection was complete, the polyp tissue was completely retrieved and sent to histology.  Retroflexed views revealed no abnormalities. The time to cecum=3 minutes 27 seconds. Withdrawal time=6 minutes 57 seconds.  The scope was withdrawn and the procedure completed. COMPLICATIONS: There were no immediate complications.  ENDOSCOPIC IMPRESSION: Sessile polyp was found in the descending colon;  polypectomy was performed with cold forceps  RECOMMENDATIONS: 1.  Await pathology results 2.  High fiber diet Recall colonoscopy pending path report  eSigned:  Lafayette Dragon, MD 04/02/2014 9:04 AM   cc:

## 2014-04-02 NOTE — Progress Notes (Signed)
Stable to RR 

## 2014-04-02 NOTE — Progress Notes (Signed)
Called to room to assist during endoscopic procedure.  Patient ID and intended procedure confirmed with present staff. Received instructions for my participation in the procedure from the performing physician.  

## 2014-04-02 NOTE — Patient Instructions (Addendum)
YOU HAD AN ENDOSCOPIC PROCEDURE TODAY AT [facility]: Refer to the procedure report that was given to you for any specific questions about what was found during the examination.  If the procedure report does not answer your questions, please call your gastroenterologist to clarify.  If you requested that your care partner not be given the details of your procedure findings, then the procedure report has been included in a sealed envelope for you to review at your convenience later.  YOU SHOULD EXPECT: Some feelings of bloating in the abdomen. Passage of more gas than usual.  Walking can help get rid of the air that was put into your GI tract during the procedure and reduce the bloating. If you had a lower endoscopy (such as a colonoscopy or flexible sigmoidoscopy) you may notice spotting of blood in your stool or on the toilet paper. If you underwent a bowel prep for your procedure, then you may not have a normal bowel movement for a few days.  DIET: Your first meal following the procedure should be a light meal and then it is ok to progress to your normal diet.  A half-sandwich or bowl of soup is an example of a good first meal.  Heavy or fried foods are harder to digest and may make you feel nauseous or bloated.  Likewise meals heavy in dairy and vegetables can cause extra gas to form and this can also increase the bloating.  Drink plenty of fluids but you should avoid alcoholic beverages for 24 hours.Try to eat a high fiber diet.  ACTIVITY: Your care partner should take you home directly after the procedure.  You should plan to take it easy, moving slowly for the rest of the day.  You can resume normal activity the day after the procedure however you should NOT DRIVE or use heavy machinery for 24 hours (because of the sedation medicines used during the test).    SYMPTOMS TO REPORT IMMEDIATELY: A gastroenterologist can be reached at any hour.  During normal business hours, 8:30 AM to 5:00  PM Monday through Friday, call [phone].  After hours and on weekends, please call the GI answering service at [phone] who will take a message and have the physician on call contact you.   Following lower endoscopy (colonoscopy or flexible sigmoidoscopy):  Excessive amounts of blood in the stool  Significant tenderness or worsening of abdominal pains  Swelling of the abdomen that is new, acute  Fever of 100F or higher  FOLLOW UP: If any biopsies were taken you will be contacted by phone or by letter within the next 1-3 weeks.  Call your gastroenterologist if you have not heard about the biopsies in 3 weeks.  Our staff will call the home number listed on your records the next business day following your procedure to check on you and address any questions or concerns that you may have at that time regarding the information given to you following your procedure. This is a courtesy call and so if there is no answer at the home number and we have not heard from you through the emergency physician on call, we will assume that you have returned to your regular daily activities without incident.  SIGNATURES/CONFIDENTIALITY: You and/or your care partner have signed paperwork which will be entered into your electronic medical record.  These signatures attest to the fact that that the information above on your After Visit Summary has been reviewed and is understood.  Full  responsibility of the confidentiality of this discharge information lies with you and/or your care-partner.  Please, read the handouts given to you by your recovery room nurse.

## 2014-04-03 ENCOUNTER — Telehealth: Payer: Self-pay

## 2014-04-03 NOTE — Telephone Encounter (Signed)
  Follow up Call-  Call back number 04/02/2014  Post procedure Call Back phone  # [phone]  Permission to leave phone message Yes     Patient questions:  Do you have a fever, pain , or abdominal swelling? No. Pain Score  0 *  Have you tolerated food without any problems? Yes.    Have you been able to return to your normal activities? Yes.    Do you have any questions about your discharge instructions: Diet   No. Medications  No. Follow up visit  No.  Do you have questions or concerns about your Care? No.  Actions: * If pain score is 4 or above: No action needed, pain <4.

## 2014-04-10 ENCOUNTER — Encounter: Payer: Self-pay | Admitting: Internal Medicine

## 2014-10-07 DIAGNOSIS — H43811 Vitreous degeneration, right eye: Secondary | ICD-10-CM | POA: Diagnosis not present

## 2014-10-22 DIAGNOSIS — R05 Cough: Secondary | ICD-10-CM | POA: Diagnosis not present

## 2014-10-22 DIAGNOSIS — R12 Heartburn: Secondary | ICD-10-CM | POA: Diagnosis not present

## 2014-11-06 DIAGNOSIS — E039 Hypothyroidism, unspecified: Secondary | ICD-10-CM | POA: Diagnosis not present

## 2014-11-06 DIAGNOSIS — Z87891 Personal history of nicotine dependence: Secondary | ICD-10-CM | POA: Diagnosis not present

## 2014-11-06 DIAGNOSIS — Z0000 Encounter for general adult medical examination without abnormal findings: Secondary | ICD-10-CM | POA: Diagnosis not present

## 2014-11-06 DIAGNOSIS — R05 Cough: Secondary | ICD-10-CM | POA: Diagnosis not present

## 2014-11-06 DIAGNOSIS — Z6832 Body mass index (BMI) 32.0-32.9, adult: Secondary | ICD-10-CM | POA: Diagnosis not present

## 2014-11-07 ENCOUNTER — Ambulatory Visit
Admission: RE | Admit: 2014-11-07 | Discharge: 2014-11-07 | Disposition: A | Discharge status: Home / Self Care | Payer: BC Managed Care – PPO | Source: Ambulatory Visit | Attending: Internal Medicine | Admitting: Internal Medicine

## 2014-11-07 ENCOUNTER — Other Ambulatory Visit: Payer: Self-pay | Admitting: Internal Medicine

## 2014-11-07 DIAGNOSIS — R059 Cough, unspecified: Secondary | ICD-10-CM

## 2014-11-07 DIAGNOSIS — R05 Cough: Secondary | ICD-10-CM

## 2014-11-27 DIAGNOSIS — R05 Cough: Secondary | ICD-10-CM | POA: Diagnosis not present

## 2014-11-27 DIAGNOSIS — H539 Unspecified visual disturbance: Secondary | ICD-10-CM | POA: Diagnosis not present

## 2014-11-27 DIAGNOSIS — K219 Gastro-esophageal reflux disease without esophagitis: Secondary | ICD-10-CM | POA: Diagnosis not present

## 2014-12-19 DIAGNOSIS — M549 Dorsalgia, unspecified: Secondary | ICD-10-CM | POA: Diagnosis not present

## 2014-12-19 DIAGNOSIS — R350 Frequency of micturition: Secondary | ICD-10-CM | POA: Diagnosis not present

## 2014-12-19 DIAGNOSIS — M0589 Other rheumatoid arthritis with rheumatoid factor of multiple sites: Secondary | ICD-10-CM | POA: Diagnosis not present

## 2014-12-20 DIAGNOSIS — H04123 Dry eye syndrome of bilateral lacrimal glands: Secondary | ICD-10-CM | POA: Diagnosis not present

## 2014-12-20 DIAGNOSIS — H40023 Open angle with borderline findings, high risk, bilateral: Secondary | ICD-10-CM | POA: Diagnosis not present

## 2015-01-13 DIAGNOSIS — R05 Cough: Secondary | ICD-10-CM | POA: Diagnosis not present

## 2015-01-13 DIAGNOSIS — K219 Gastro-esophageal reflux disease without esophagitis: Secondary | ICD-10-CM | POA: Diagnosis not present

## 2015-01-13 DIAGNOSIS — Z87891 Personal history of nicotine dependence: Secondary | ICD-10-CM | POA: Diagnosis not present

## 2015-01-28 DIAGNOSIS — K219 Gastro-esophageal reflux disease without esophagitis: Secondary | ICD-10-CM | POA: Diagnosis not present

## 2015-01-28 DIAGNOSIS — R05 Cough: Secondary | ICD-10-CM | POA: Diagnosis not present

## 2015-03-10 DIAGNOSIS — Z1231 Encounter for screening mammogram for malignant neoplasm of breast: Secondary | ICD-10-CM | POA: Diagnosis not present

## 2015-03-13 DIAGNOSIS — N63 Unspecified lump in breast: Secondary | ICD-10-CM | POA: Diagnosis not present

## 2015-03-13 DIAGNOSIS — Z1231 Encounter for screening mammogram for malignant neoplasm of breast: Secondary | ICD-10-CM | POA: Diagnosis not present

## 2015-03-18 ENCOUNTER — Other Ambulatory Visit: Payer: Self-pay | Admitting: Radiology

## 2015-03-18 DIAGNOSIS — Z1231 Encounter for screening mammogram for malignant neoplasm of breast: Secondary | ICD-10-CM | POA: Diagnosis not present

## 2015-03-18 DIAGNOSIS — N63 Unspecified lump in breast: Secondary | ICD-10-CM | POA: Diagnosis not present

## 2015-03-18 DIAGNOSIS — Z0000 Encounter for general adult medical examination without abnormal findings: Secondary | ICD-10-CM | POA: Diagnosis not present

## 2015-03-18 DIAGNOSIS — D0511 Intraductal carcinoma in situ of right breast: Secondary | ICD-10-CM | POA: Diagnosis not present

## 2015-03-20 ENCOUNTER — Telehealth: Payer: Self-pay | Admitting: *Deleted

## 2015-03-20 ENCOUNTER — Encounter: Payer: Self-pay | Admitting: *Deleted

## 2015-03-20 DIAGNOSIS — D0510 Intraductal carcinoma in situ of unspecified breast: Secondary | ICD-10-CM | POA: Insufficient documentation

## 2015-03-20 DIAGNOSIS — C50411 Malignant neoplasm of upper-outer quadrant of right female breast: Secondary | ICD-10-CM | POA: Insufficient documentation

## 2015-03-20 HISTORY — DX: Malignant neoplasm of upper-outer quadrant of right female breast: C50.411

## 2015-03-20 NOTE — Telephone Encounter (Signed)
Confirmed [facility] for 03/26/15 at 0830 .  Instructions and contact information given.

## 2015-03-24 DIAGNOSIS — D0511 Intraductal carcinoma in situ of right breast: Secondary | ICD-10-CM | POA: Diagnosis not present

## 2015-03-24 DIAGNOSIS — Z01419 Encounter for gynecological examination (general) (routine) without abnormal findings: Secondary | ICD-10-CM | POA: Diagnosis not present

## 2015-03-24 DIAGNOSIS — Z124 Encounter for screening for malignant neoplasm of cervix: Secondary | ICD-10-CM | POA: Diagnosis not present

## 2015-03-24 DIAGNOSIS — Z6830 Body mass index (BMI) 30.0-30.9, adult: Secondary | ICD-10-CM | POA: Diagnosis not present

## 2015-03-25 NOTE — Progress Notes (Signed)
[name]  [phone] Fax:([phone]) [place] Note   Patient Care Team: [name], MD as PCP - General (Internal Medicine) Earnstine Regal, PA-C as Physician Assistant (Obstetrics and Gynecology) Rolm Bookbinder, MD as Consulting Physician (General Surgery) Truitt Merle, MD as Consulting Physician (Hematology) Thea Silversmith, MD as Consulting Physician (Radiation Oncology) Mauro Kaufmann, RN as Registered Nurse Rockwell Germany, RN as Registered Nurse Sylvan Cheese, NP as Nurse Practitioner (Hematology and Oncology) 03/26/2015  CHIEF COMPLAINTS/PURPOSE OF CONSULTATION:  Newly diagnosed right breast DCIS  Oncology History   Breast cancer of upper-outer quadrant of right female breast [facility])   Staging form: Breast, AJCC 7th Edition     Clinical stage from 03/26/2015: Stage 0 (Tis (DCIS), N0, M0) - Unsigned       Breast cancer of upper-outer quadrant of right female breast ([place])   03/13/2015 Mammogram 7 mm oval mass in the right breast is suspicious for malignancy.   03/20/2015 Initial Diagnosis Breast cancer of upper-outer quadrant of right female breast ([place])   03/20/2015 Initial Biopsy Breast, right, needle core biopsy DUCTAL CARCINOMA IN SITU IN PAPILLOMA, LOW GRADE   03/20/2015 Receptors her2 Estrogen Receptor: 95%, POSITIVE, STRONG STAINING INTENSITY Progesterone Receptor: 95%, POSITIVE, STRONG STAINING INTENSITY    HISTORY OF PRESENTING ILLNESS:  Evelyn Orr 69 y.o. female is here because of her newly diagnosed right breast DCIS. She presents to our multidisciplinary breast clinic today with her husband and other family members.  This was discovered by screening mammogram. She did not have any palpable breast or axillary masses. She denies any new symptoms. She feels well, no complains, she is physically active, she exercises 4 time a week, lives with her husband.  She had bilateral breast reduction in 2003.  She has multiple family members who had history of breast cancer.  MEDICAL HISTORY:  Past Medical History  Diagnosis Date  . Arthritis   . Thyroid disease     hypothyroidism  . Rheumatoid arthritis(714.0)   . PONV (postoperative nausea and vomiting)   . Breast cancer of upper-outer quadrant of right female breast ([place]) 03/20/2015    SURGICAL HISTORY: Past Surgical History  Procedure Laterality Date  . Breast reduction surgery  2001  . Thyroidectomy      age 87  . Wrist arthrodesis  2003    left-rt knee injection  . Shoulder arthroscopy w/ rotator cuff repair  2006    right  . Carpal tunnel release  2006    rt  . Cervical fusion  2009  . Carpal tunnel release      left  . Knee arthroscopy  2004    right  . Colonoscopy      2002,2005,2008 w/[facility]  . Gastrocnemius recession Left 10/11/2013    Procedure: LEFT GASTROC RECESSION; LEFT POSTERIOR TIBIAL TENOLYSIS;  Surgeon: Wylene Simmer, MD;  Location: [place] [place];  Service: Orthopedics;  Laterality: Left;  . Calcaneal osteotomy Left 10/11/2013    Procedure: LEFT CALCANEAL OSTEOTOMY;  Surgeon: Wylene Simmer, MD;  Location: [place];  Service: Orthopedics;  Laterality: Left;  . Removal of implant Left 02/14/2014    Procedure: LEFT FOOT REMOVAL OF DEEP IMPLANT;  Surgeon: Wylene Simmer, MD;  Location: [facility];  Service: Orthopedics;  Laterality: Left;  . Polypectomy     GYN HISTORY  Menarchal: 14 LMP: 47  Contraceptive: 3-4 years  HRT: no  G2P2: 2 children 55 daughter and 37 son, no  breast feeding    SOCIAL HISTORY: Social History   Social History  . Marital Status: Married    Spouse Name: N/A  . Number of Children: N/A  . Years of Education: N/A   Occupational History  . Not on file.   Social History Main Topics  . Smoking status: Former Smoker    Quit date: 02/13/1984  . Smokeless tobacco: Never Used  . Alcohol Use: No  . Drug Use: No  . Sexual Activity: Yes    Birth  Control/ Protection: Post-menopausal   Other Topics Concern  . Not on file   Social History Narrative    FAMILY HISTORY: Family History  Problem Relation Age of Onset  . Heart disease Father   . Hypertension Mother   . Breast cancer Paternal Aunt   . Colon cancer Paternal Uncle   . Colon cancer Maternal Uncle   . Rectal cancer Neg Hx   . Stomach cancer Neg Hx   . Breast cancer Cousin     4 maternal cousin had breast cancer     ALLERGIES:  has No Known Allergies.  MEDICATIONS:  Current Outpatient Prescriptions  Medication Sig Dispense Refill  . levothyroxine (SYNTHROID, LEVOTHROID) 88 MCG tablet Take 88 mcg by mouth daily.      . Methotrexate Sodium (METHOTREXATE PO) Take by mouth. Pt takes 6 pills once a week for arthritis.    Marland Kitchen HYDROcodone-acetaminophen (NORCO) 5-325 MG per tablet Take 1-2 tablets by mouth every 6 (six) hours as needed for moderate pain. (Patient not taking: Reported on 03/26/2015) 10 tablet 0   No current facility-administered medications for this visit.    REVIEW OF SYSTEMS:   Constitutional: Denies fevers, chills or abnormal night sweats Eyes: Denies blurriness of vision, double vision or watery eyes Ears, nose, mouth, throat, and face: Denies mucositis or sore throat Respiratory: Denies cough, dyspnea or wheezes Cardiovascular: Denies palpitation, chest discomfort or lower extremity swelling Gastrointestinal:  Denies nausea, heartburn or change in bowel habits Skin: Denies abnormal skin rashes Lymphatics: Denies new lymphadenopathy or easy bruising Neurological:Denies numbness, tingling or new weaknesses Behavioral/Psych: Mood is stable, no new changes  All other systems were reviewed with the patient and are negative.  PHYSICAL EXAMINATION: ECOG PERFORMANCE STATUS: 0 - Asymptomatic  Filed Vitals:   03/26/15 0857  BP: 133/79  Pulse: 73  Temp: 98.7 F (37.1 C)  Resp: 18   Filed Weights   03/26/15 0857  Weight: 169 lb 4.8 oz (76.794 kg)      GENERAL:alert, no distress and comfortable SKIN: skin color, texture, turgor are normal, no rashes or significant lesions EYES: normal, conjunctiva are pink and non-injected, sclera clear OROPHARYNX:no exudate, no erythema and lips, buccal mucosa, and tongue normal  NECK: supple, thyroid normal size, non-tender, without nodularity LYMPH:  no palpable lymphadenopathy in the cervical, axillary or inguinal LUNGS: clear to auscultation and percussion with normal breathing effort HEART: regular rate & rhythm and no murmurs and no lower extremity edema ABDOMEN:abdomen soft, non-tender and normal bowel sounds Musculoskeletal:no cyanosis of digits and no clubbing  PSYCH: alert & oriented x 3 with fluent speech NEURO: no focal motor/sensory deficits Breasts: Breast inspection showed them to be symmetrical with no nipple discharge. Palpation of the breasts and axilla revealed no obvious mass that I could appreciate.   LABORATORY DATA:  I have reviewed the data as listed Lab Results  Component Value Date   WBC 5.5 03/26/2015   HGB 12.6 03/26/2015   HCT 38.3 03/26/2015  MCV 94.3 03/26/2015   PLT 305 03/26/2015    Recent Labs  03/26/15 0835  NA 142  K 3.7  CO2 28  GLUCOSE 84  BUN 8.9  CREATININE 0.8  CALCIUM 9.5  PROT 7.6  ALBUMIN 3.6  AST 20  ALT 12  ALKPHOS 101  BILITOT 0.60    Pathology report  Diagnosis 03/18/2015  Breast, right, needle core biopsy DUCTAL CARCINOMA IN SITU IN PAPILLOMA, LOW GRADE  Results: IMMUNOHISTOCHEMICAL AND MORPHOMETRIC ANALYSIS PERFORMED MANUALLY Estrogen Receptor: 95%, POSITIVE, STRONG STAINING INTENSITY Progesterone Receptor: 95%, POSITIVE, STRONG STAINING INTENSITY   RADIOGRAPHIC STUDIES: I have personally reviewed the radiological images as listed and agreed with the findings in the report. No results found.  ASSESSMENT & PLAN:  69 year old postmenopausal woman, presented with screening discovered DCIS.  1. Right breast DCIS, low  grade, ER and PR strongly positive -I discussed her breast imaging and needle biopsy results with patient and her family members in great detail. -She is a candidate for breast conservation surgery. She was seen by breast surgeon Dr. Donne Hazel today, who recommends lumpectomy.  -Given her family history of breast cancer, we recommend her to undergo genetic testing to ruled out inheritable breast cancer -Her DCIS will be cured by complete surgical resection. Any form of adjuvant therapy is preventive. -She may benefit from breast radiation if she undergo lumpectomy to decrease the risk of breast cancer. She was seen by radiation oncologist Dr. Pablo Ledger today. Given her low-grade DCIS, Dr. Wilburt Finlay was recommended Oncotype DX test. -Given her strong ER/PR positivity and good baseline health, I recommend adjuvant endocrine therapy with tamoxifen or anastrozole. The benefit (reduce risk of breast cancer by 30-40%, but does not increase overall survival), and the risks of these agents were discussed with her and her family members in great details -We also discussed that biopsy may have sampling limitation, we will review her surgical path, to see if she has any invasive carcinoma components. -We discussed breast cancer surveillance after she completes treatment, Including annual mammogram, monthly self breast exam, and breast exam by a physician every 6-12 months.  2. Hyperthyroidism and rheumatoid arthritis -She will continue follow-up with her primary care physician. -She is on weekly methotrexate, and her rheumatoid arthritis has been well-controlled.  Plan -She will likely have lumpectomy soon -Genetic refer -I'll review her surgical pathology report. I'll see her 2-3 weeks after her surgery to review her surgical pathology and finalize her adjuvant endocrine therapy  All questions were answered. The patient knows to call the clinic with any problems, questions or concerns. I spent 45 minutes  counseling the patient face to face. The total time spent in the appointment was 50 minutes and more than 50% was on counseling.     Truitt Merle, MD 03/26/2015 8:19 PM

## 2015-03-26 ENCOUNTER — Encounter: Payer: Self-pay | Admitting: Hematology

## 2015-03-26 ENCOUNTER — Encounter: Payer: Self-pay | Admitting: Nurse Practitioner

## 2015-03-26 ENCOUNTER — Ambulatory Visit: Payer: Medicare Other | Admitting: Physical Therapy

## 2015-03-26 ENCOUNTER — Other Ambulatory Visit (HOSPITAL_BASED_OUTPATIENT_CLINIC_OR_DEPARTMENT_OTHER): Payer: Medicare Other

## 2015-03-26 ENCOUNTER — Other Ambulatory Visit: Payer: Self-pay | Admitting: General Surgery

## 2015-03-26 ENCOUNTER — Ambulatory Visit
Admission: RE | Admit: 2015-03-26 | Discharge: 2015-03-26 | Disposition: A | Discharge status: Home / Self Care | Payer: BC Managed Care – PPO | Source: Ambulatory Visit | Attending: Radiation Oncology | Admitting: Radiation Oncology

## 2015-03-26 ENCOUNTER — Encounter: Payer: Self-pay | Admitting: Skilled Nursing Facility1

## 2015-03-26 ENCOUNTER — Ambulatory Visit (HOSPITAL_BASED_OUTPATIENT_CLINIC_OR_DEPARTMENT_OTHER): Payer: Medicare Other | Admitting: Hematology

## 2015-03-26 VITALS — BP 133/79 | HR 73 | Temp 98.7°F | Resp 18 | Ht 62.0 in | Wt 169.3 lb

## 2015-03-26 DIAGNOSIS — C50411 Malignant neoplasm of upper-outer quadrant of right female breast: Secondary | ICD-10-CM

## 2015-03-26 DIAGNOSIS — D0511 Intraductal carcinoma in situ of right breast: Secondary | ICD-10-CM

## 2015-03-26 DIAGNOSIS — Z170 Estrogen receptor positive status [ER+]: Secondary | ICD-10-CM

## 2015-03-26 DIAGNOSIS — D0510 Intraductal carcinoma in situ of unspecified breast: Secondary | ICD-10-CM | POA: Diagnosis not present

## 2015-03-26 LAB — CBC WITH DIFFERENTIAL/PLATELET
BASO%: 0.7 % (ref 0.0–2.0)
Basophils Absolute: 0 10*3/uL (ref 0.0–0.1)
EOS%: 1.6 % (ref 0.0–7.0)
Eosinophils Absolute: 0.1 10*3/uL (ref 0.0–0.5)
HEMATOCRIT: 38.3 % (ref 34.8–46.6)
HGB: 12.6 g/dL (ref 11.6–15.9)
LYMPH%: 50.4 % — ABNORMAL HIGH (ref 14.0–49.7)
MCH: 31 pg (ref 25.1–34.0)
MCHC: 32.9 g/dL (ref 31.5–36.0)
MCV: 94.3 fL (ref 79.5–101.0)
MONO#: 0.4 10*3/uL (ref 0.1–0.9)
MONO%: 6.5 % (ref 0.0–14.0)
NEUT%: 40.8 % (ref 38.4–76.8)
NEUTROS ABS: 2.3 10*3/uL (ref 1.5–6.5)
Platelets: 305 10*3/uL (ref 145–400)
RBC: 4.06 10*6/uL (ref 3.70–5.45)
RDW: 14.2 % (ref 11.2–14.5)
WBC: 5.5 10*3/uL (ref 3.9–10.3)
lymph#: 2.8 10*3/uL (ref 0.9–3.3)

## 2015-03-26 LAB — COMPREHENSIVE METABOLIC PANEL (CC13)
ALT: 12 U/L (ref 0–55)
ANION GAP: 6 meq/L (ref 3–11)
AST: 20 U/L (ref 5–34)
Albumin: 3.6 g/dL (ref 3.5–5.0)
Alkaline Phosphatase: 101 U/L (ref 40–150)
BILIRUBIN TOTAL: 0.6 mg/dL (ref 0.20–1.20)
BUN: 8.9 mg/dL (ref 7.0–26.0)
CALCIUM: 9.5 mg/dL (ref 8.4–10.4)
CO2: 28 mEq/L (ref 22–29)
Chloride: 107 mEq/L (ref 98–109)
Creatinine: 0.8 mg/dL (ref 0.6–1.1)
EGFR: >90 mL/min/{1.73_m2} (ref >90)
GLUCOSE: 84 mg/dL (ref 70–140)
Potassium: 3.7 mEq/L (ref 3.5–5.1)
Sodium: 142 mEq/L (ref 136–145)
TOTAL PROTEIN: 7.6 g/dL (ref 6.4–8.3)

## 2015-03-26 NOTE — Progress Notes (Signed)
Evelyn Orr is a very pleasant 69 y.o. female from [place], [place] with newly diagnosed ductal carcinoma in situ of the right breast.  Biopsy results revealed the tumor's prognostic profile is ER positive and PR positive.    She presents today with her family to the [facility]) for treatment consideration and recommendations from the breast surgeon, radiation oncologist, and medical oncologist.     I briefly met with Evelyn Orr and her family during her [facility] visit today. We discussed the purpose of the Survivorship Clinic, which will include monitoring for recurrence, coordinating completion of age and gender-appropriate cancer screenings, promotion of overall wellness, as well as managing potential late/long-term side effects of anti-cancer treatments.    The treatment plan for Evelyn Orr will likely include surgery, radiation therapy, and anti-estrogen therapy.  She will meet with the Genetics Counselor due to her family history of breast cancer. As of today, the intent of treatment for Evelyn Orr is cure, therefore she will be eligible for the Survivorship Clinic upon her completion of treatment.  Her survivorship care plan (SCP) document will be drafted and updated throughout the course of her treatment trajectory. She will receive the SCP in an office visit with myself in the Survivorship Clinic once she has completed treatment.   Evelyn Orr was encouraged to ask questions and all questions were answered to her satisfaction.  She was given my business card and encouraged to contact me with any concerns regarding survivorship.  I look forward to participating in her care.   Kenn File, [place] [phone]

## 2015-03-26 NOTE — Progress Notes (Signed)
Subjective:     Patient ID: Evelyn Orr, female   DOB: Dec 24, 1945, 69 y.o.   MRN: [id number]  HPI   Review of Systems     Objective:   Physical Exam For the patient to understand and be given the tools to implement a healthy plant based diet during their cancer diagnosis.     Assessment:     Patient was seen today and found to be in good spirits and accompanied by her family. Pts ht 5'2'', 169 pounds, and BMI 31. Pt is currently taking levothyroxine. Pt states she is physically active. Pt had no questions or concerns.      Plan:     Dietitian educated the patient on implementing a plant based diet by incorporating more plant proteins, fruits, and vegetables. As a part of a healthy routine physical activity was discussed. The importance of legitimate, evidence based information was discussed and examples were given. A folder of evidence based information with a focus on a plant based diet and general nutrition during cancer was given to the patient.  As a part of the continuum of care the cancer dietitian's contact information was given to the patient in the event they would like to have a follow up appointment.

## 2015-03-26 NOTE — Progress Notes (Signed)
Radiation Oncology         [phone]) [phone] ________________________________  Initial Outpatient Consultation - Date: 03/26/2015   Name: Evelyn Orr MRN: [id number]   DOB: 11/27/1945  REFERRING PHYSICIAN: Rolm Bookbinder, MD  DIAGNOSIS AND STAGE: Stage 0 DCIS of the Right Breast  HISTORY OF PRESENT ILLNESS::Evelyn Orr is a 69 y.o. female with a history of bilateral breast reductions who was found to have a breast mass on a screening mammogram. This measured 7 mm on ultrasound and a biopsy showed low grade DCIS in a papilloma. This was ER/PR positive. She has a family history of a paternal aunt with breast cancer. She is accompanied by her husband (a colon cancer patient treated by Dr. Bubba Camp and Dr. Ralene Ok), and 2 friends. She has no breast related complaints.   PREVIOUS RADIATION THERAPY: No  Past medical, social and family history were reviewed in the electronic chart. Review of symptoms was reviewed in the electronic chart. Medications were reviewed in the electronic chart.  Gynecologic History  Are you still having periods? No  If you no longer have periods: Have you used hormone replacement? No Obstetric History:  How many children have you carried to term? 2 Your age at first live birth? 53  Pregnant now or trying to get pregnant? No  Have you used birth control pills or hormone shots for contraception? Yes  If so, for how long (or approximate dates)? 1976  Would you be interested in learning more about the options to preserve fertility? No Health Maintenance:  Have you ever had a colonoscopy? Yes If yes, date? 2015  Have you ever had a bone density? Yes  Date of your last PAP smear? 03/24/15  PHYSICAL EXAM:  Vitals with BMI 03/26/2015  Height 5' 2"  Weight 169 lbs 5 oz  BMI 31  Systolic 570  Diastolic 79  Pulse 73  Respirations 46  Pleasant female in no distress. Appears younger than her stated age. Status post bilateral breast reductions. Scars are  well healed. No palpable abnormalities of the bilateral breasts. No palpable cervical, supraclavicular, or axillary adenopathy.  IMPRESSION: Stage 0 DCIS of the Right Breast  PLAN:  I spoke to the patient today regarding her diagnosis and options for treatment. We discussed the equivalence in terms of survival and local failure between mastectomy and breast conservation. We discussed the role of radiation in decreasing local failures in patients who undergo lumpectomy. We discussed the process of simulation and the placement tattoos. We discussed 4-6 weeks of treatment as an outpatient. We discussed the possibility of asymptomatic lung damage. We discussed the low likelihood of secondary malignancies. We discussed the possible side effects including but not limited to skin redness, fatigue, permanent skin darkening, and breast swelling.  We discussed that since her tumor was low grade and small, there may be little benefit to radiation after surgery. I have suggested oncotype testing at the time of surgery. I will review this with her after her post-operative visit with Dr. Donne Hazel.  She also met with surgery, medical oncology as well as a member of our patient family support team, a dietitian, survivorship nurse practitioner, breast cancer navigator, and our physical therapist.  She will be referred to genetic counseling on November 1 at [facility].  I spent 40 minutes  face to face with the patient and more than 50% of that time was spent in counseling and/or coordination of care.  This document serves as a record of services personally performed by  Thea Silversmith, MD. It was created on her behalf by Darcus Austin, a trained medical scribe. The creation of this record is based on the scribe's personal observations and the provider's statements to them. This document has been checked and approved by the attending provider.   ------------------------------------------------  Thea Silversmith, MD

## 2015-03-26 NOTE — Progress Notes (Unsigned)
Subjective:     Patient ID: Evelyn Orr, female   DOB: 28-May-1946, 69 y.o.   MRN: [id number]  HPI   Review of Systems     Objective:   Physical Exam For the patient to understand and be given the tools to implement a healthy plant based diet during their cancer diagnosis.     Assessment:     Patient was seen today and found to be.... Pts ht 5'2'', 169 pounds, BMI 31. Pts labs WNL. Pt is taking levothyroxine.        Plan:     Dietitian educated the patient on implementing a plant based diet by incorporating more plant proteins, fruits, and vegetables. As a part of a healthy routine physical activity was discussed. A folder of evidence based information with a focus on a plant based diet and general nutrition during cancer was given to the patient.  The importance of legitimate, evidence based information was discussed and examples were given. As a part of the continuum of care the cancer dietitian's contact information was given to the patient in the event they would like to have a follow up appointment.

## 2015-03-27 ENCOUNTER — Other Ambulatory Visit: Payer: Self-pay | Admitting: General Surgery

## 2015-03-27 DIAGNOSIS — M0589 Other rheumatoid arthritis with rheumatoid factor of multiple sites: Secondary | ICD-10-CM | POA: Diagnosis not present

## 2015-03-27 DIAGNOSIS — Z79899 Other long term (current) drug therapy: Secondary | ICD-10-CM | POA: Diagnosis not present

## 2015-03-31 ENCOUNTER — Encounter: Payer: Self-pay | Admitting: *Deleted

## 2015-03-31 NOTE — Progress Notes (Signed)
Clinical Social Work [place] Psychosocial Distress Screening [place]  Patient completed distress screening protocol and scored a 0 on the Psychosocial Distress Thermometer which indicates no distress. Clinical Social Worker met with patient and patients family in [facility] to assess for distress and other psychosocial needs. Patient stated she was doing "ok" and felt comfortable with her treatment plan and treatment team. CSW and patient discussed common feeling and emotions when being diagnosed with cancer, and the importance of support during treatment. CSW informed patient of the support team and support services at [facility]. CSW provided contact information and encouraged patient to call with any questions or concerns  ONCBCN DISTRESS SCREENING 03/31/2015  Screening Type Initial Screening  Distress experienced in past week (1-10) 0    Johnnye Lana, MSW, LCSW, OSW-C Clinical Social Worker [place] [phone]

## 2015-04-01 ENCOUNTER — Other Ambulatory Visit: Payer: BC Managed Care – PPO

## 2015-04-01 ENCOUNTER — Telehealth: Payer: Self-pay | Admitting: *Deleted

## 2015-04-01 ENCOUNTER — Other Ambulatory Visit: Payer: Self-pay | Admitting: *Deleted

## 2015-04-01 ENCOUNTER — Ambulatory Visit (HOSPITAL_BASED_OUTPATIENT_CLINIC_OR_DEPARTMENT_OTHER): Payer: Medicare Other | Admitting: Genetic Counselor

## 2015-04-01 ENCOUNTER — Encounter: Payer: Self-pay | Admitting: Genetic Counselor

## 2015-04-01 DIAGNOSIS — Z803 Family history of malignant neoplasm of breast: Secondary | ICD-10-CM | POA: Diagnosis not present

## 2015-04-01 DIAGNOSIS — Z800 Family history of malignant neoplasm of digestive organs: Secondary | ICD-10-CM

## 2015-04-01 DIAGNOSIS — Z809 Family history of malignant neoplasm, unspecified: Secondary | ICD-10-CM

## 2015-04-01 DIAGNOSIS — C50411 Malignant neoplasm of upper-outer quadrant of right female breast: Secondary | ICD-10-CM | POA: Diagnosis not present

## 2015-04-01 DIAGNOSIS — Z8042 Family history of malignant neoplasm of prostate: Secondary | ICD-10-CM | POA: Diagnosis not present

## 2015-04-01 NOTE — Telephone Encounter (Signed)
Spoke with patient from [facility] 03/26/15. She is doing well with no complaints.  Encouraged her to call with any needs or concerns.

## 2015-04-01 NOTE — Progress Notes (Signed)
REFERRING PROVIDER: Truitt Merle, MD  PRIMARY PROVIDER:  Maximino Greenland, MD  PRIMARY REASON FOR VISIT:  1. Breast cancer of upper-outer quadrant of right female breast ([place])   2. Family history of breast cancer   3. Family history of prostate cancer   4. Family history of colon cancer   5. Family history of cancer      HISTORY OF PRESENT ILLNESS:   Evelyn Orr, a 69 y.o. female, was seen for a [place] cancer genetics consultation at the request of Dr. Burr Medico due to a personal history of breast cancer and family history of breast and prostate cancesr.  Evelyn Orr presents to clinic today with a friend to discuss the possibility of a hereditary predisposition to cancer, genetic testing, and to further clarify her future cancer risks, as well as potential cancer risks for family members.   In 2016, at the age of 39, Evelyn Orr was diagnosed with DCIS of the right breast. Hormone receptor status was ER/PR+.  Surgery date is upcoming.   CANCER HISTORY:  Oncology History   Breast cancer of upper-outer quadrant of right female breast [facility])   Staging form: Breast, AJCC 7th Edition     Clinical stage from 03/26/2015: Stage 0 (Tis (DCIS), N0, M0) - Unsigned       Breast cancer of upper-outer quadrant of right female breast ([place])   03/13/2015 Mammogram 7 mm oval mass in the right breast is suspicious for malignancy.   03/20/2015 Initial Diagnosis Breast cancer of upper-outer quadrant of right female breast ([place])   03/20/2015 Initial Biopsy Breast, right, needle core biopsy DUCTAL CARCINOMA IN SITU IN PAPILLOMA, LOW GRADE   03/20/2015 Receptors her2 Estrogen Receptor: 95%, POSITIVE, STRONG STAINING INTENSITY Progesterone Receptor: 95%, POSITIVE, STRONG STAINING INTENSITY     HORMONAL RISK FACTORS:  Menarche was at age 36-13.  First live birth at age 22.  OCP use for approximately - until the age of 29? - maybe approx 20 years Ovaries intact: yes.  Hysterectomy: no.   Menopausal status: postmenopausal.  HRT use: 0 years. Colonoscopy: yes; last in 2015 identified 1 smooth sessile polyp; hx of adenomas in 2002 - pt estimates maybe about 3 polyps total. Mammogram within the last year: yes. Number of breast biopsies: 1. Up to date with pelvic exams:  yes. Any excessive radiation exposure in the past:  no  Past Medical History  Diagnosis Date  . Arthritis   . Thyroid disease     hypothyroidism  . Rheumatoid arthritis(714.0)   . PONV (postoperative nausea and vomiting)   . Breast cancer of upper-outer quadrant of right female breast ([place]) 03/20/2015    Past Surgical History  Procedure Laterality Date  . Breast reduction surgery  2001  . Thyroidectomy      age 82  . Wrist arthrodesis  2003    left-rt knee injection  . Shoulder arthroscopy w/ rotator cuff repair  2006    right  . Carpal tunnel release  2006    rt  . Cervical fusion  2009  . Carpal tunnel release      left  . Knee arthroscopy  2004    right  . Colonoscopy      2002,2005,2008 w/[facility]  . Gastrocnemius recession Left 10/11/2013    Procedure: LEFT GASTROC RECESSION; LEFT POSTERIOR TIBIAL TENOLYSIS;  Surgeon: Wylene Simmer, MD;  Location: [facility];  Service: Orthopedics;  Laterality: Left;  . Calcaneal osteotomy Left 10/11/2013    Procedure: LEFT CALCANEAL OSTEOTOMY;  Surgeon: Toni Arthurs, MD;  Location: [facility];  Service: Orthopedics;  Laterality: Left;  . Removal of implant Left 02/14/2014    Procedure: LEFT FOOT REMOVAL OF DEEP IMPLANT;  Surgeon: Toni Arthurs, MD;  Location: [facility];  Service: Orthopedics;  Laterality: Left;  . Polypectomy      Social History   Social History  . Marital Status: Married    Spouse Name: N/A  . Number of Children: N/A  . Years of Education: N/A   Social History Main Topics  . Smoking status: Former Smoker -- 0.25 packs/day for 21 years    Types: Cigarettes    Quit date: 02/13/1984  .  Smokeless tobacco: Never Used  . Alcohol Use: No  . Drug Use: No  . Sexual Activity: Yes    Birth Control/ Protection: Post-menopausal   Other Topics Concern  . None   Social History Narrative     FAMILY HISTORY:  We obtained a detailed, 4-generation family history.  Significant diagnoses are listed below: Family History  Problem Relation Age of Onset  . Heart disease Father   . Heart attack Father   . Hypertension Mother   . Breast cancer Paternal Aunt     dx. 35 or younger  . Colon cancer Paternal Uncle   . Prostate cancer Maternal Uncle   . Rectal cancer Neg Hx   . Stomach cancer Neg Hx   . Breast cancer Cousin     4 maternal cousin had breast cancer   . Other Sister     high breast density  . Prostate cancer Brother 47    unknown Gleason  . Prostate cancer Maternal Grandfather     dx. older age  . Colon polyps Son 66    colonoscopy for unspecified reason; found some colon polyps, has not had one since  . Prostate cancer Maternal Uncle   . Prostate cancer Maternal Uncle   . Colon cancer Other   . Cancer Other     MGM's sister    Evelyn Orr has one daughter, age 10, and one son, age 45.  Her son has a history of colon polyps found on colonoscopy when he was 21.  He has not had a colonoscopy since.  Evelyn Orr currently has one grandson and two granddaughters.  She has one full brother who is currently 43 and has a history of prostate cancer diagnosed at 61.  She also has one maternal half-brother and one paternal half-sister, both of whom are 59 and have not had cancer.  Evelyn Orr mother died of organ failure at 61.  Her father died of a heart attack at 68.    Evelyn Orr mother had one full sister and three full brothers.  The maternal aunt passed away at a later age in life, and had one son and two daughters of her own.  One daughter was diagnosed with breast cancer at 67.  One granddaughter was diagnosed with breast cancer in her late 35s.  All  three of Evelyn Orr's maternal uncles had a history of prostate cancer at unspecified ages.  They all passed away later in life.  Two of these uncles had sons, but no daughters--none of these sons have had cancer to Evelyn Orr's knowledge.  Evelyn Orr maternal grandmother died in her 86s.  She had three full sisters and three full brothers.  One sister had a history of an unspecified cancer; two brothers had a history of colon cancer.  Evelyn Orr maternal grandfather died in his 22s; he had a history of prostate cancer diagnosed at an older age.  Evelyn Orr father had one full sister and two full brothers.  His sister died at 59 of breast cancer.  She has two daughters and a son, all of whom are in their 24s and have not had cancer.  Both brothers died in their 59s and never had cancer.  Evelyn Orr paternal grandmother died later in life, as did her paternal grandfather.    She is unaware of any additional cancer diagnoses or whether anyone has previously had genetic testing.  Patient's maternal and paternal ancestors are of African American descent. There is no reported Ashkenazi Jewish ancestry. There is no known consanguinity.  GENETIC COUNSELING ASSESSMENT: Evelyn Orr is a 69 y.o. female with a personal and family history of cancer which is somewhat suggestive of a hereditary breast cancer syndrome and predisposition to cancer. We, therefore, discussed and recommended the following at today's visit.   DISCUSSION: We reviewed the characteristics, features and inheritance patterns of hereditary cancer syndromes, particularly those caused by mutations within the BRCA1 and BRCA2 genes. We also discussed genetic testing, including the appropriate family members to test, the process of testing, insurance coverage and turn-around-time for results. We discussed the implications of a negative, positive and/or variant of uncertain significant result. We recommended Ms.  Orr pursue genetic testing for the 20-gene Breast/Ovarian Cancer Panel through [facility] Hope Pigeon, MD).  The Breast/Ovarian Cancer Panel offered by [facility] includes sequencing and deletion/duplication analysis for the following 19 genes:  ATM, BARD1, BRCA1, BRCA2, BRIP1, CDH1, CHEK2, FANCC, MLH1, MSH2, MSH6, NBN, PALB2, PMS2, PTEN, RAD51C, RAD51D, TP53, and XRCC2.  This panel also includes deletion/duplication analysis (without sequencing) for one gene, EPCAM.  Based on Ms. Shibata's personal and family history of cancer, she meets medical criteria for genetic testing. Despite that she meets criteria, she may still have an out of pocket cost. We discussed that if her out of pocket cost for testing is over $100, the laboratory will call and confirm whether she wants to proceed with testing.  If the out of pocket cost of testing is less than $100 she will be billed by the genetic testing laboratory.   PLAN: After considering the risks, benefits, and limitations, Evelyn Orr  provided informed consent to pursue genetic testing and the blood sample was sent to [facility] for analysis of the 20-gene Breast/Ovarian Cancer Panel test. Results should be available within approximately 2-3 weeks' time, at which point they will be disclosed by telephone to Evelyn Orr, as will any additional recommendations warranted by these results. Evelyn Orr will receive a summary of her genetic counseling visit and a copy of her results once available. This information will also be available in Epic. We encouraged Evelyn Orr to remain in contact with cancer genetics annually so that we can continuously update the family history and inform her of any changes in cancer genetics and testing that may be of benefit for her family. Evelyn Orr questions were answered to her satisfaction today. Our contact information was provided should additional questions or concerns arise.  Thank you  for the referral and allowing [place] to share in the care of your patient.   Jeanine Luz, MS Genetic Counselor [email or web address] phone: [phone]  The patient was seen for a total of 5 minutes in face-to-face genetic counseling.  This patient was discussed with Drs. Magrinat, Lindi Adie and/or Burr Medico who agrees with the above.  _______________________________________________________________________ For Office Staff:  Number of people involved in session: 2 Was an Intern/ student involved with case: no

## 2015-04-03 ENCOUNTER — Encounter (HOSPITAL_BASED_OUTPATIENT_CLINIC_OR_DEPARTMENT_OTHER): Payer: Self-pay | Admitting: *Deleted

## 2015-04-07 ENCOUNTER — Telehealth: Payer: Self-pay | Admitting: Hematology

## 2015-04-07 NOTE — Telephone Encounter (Signed)
Spoke with patient re f/u for 11/28.

## 2015-04-08 DIAGNOSIS — Z1231 Encounter for screening mammogram for malignant neoplasm of breast: Secondary | ICD-10-CM | POA: Diagnosis not present

## 2015-04-08 DIAGNOSIS — N63 Unspecified lump in breast: Secondary | ICD-10-CM | POA: Diagnosis not present

## 2015-04-08 DIAGNOSIS — D0591 Unspecified type of carcinoma in situ of right breast: Secondary | ICD-10-CM | POA: Diagnosis not present

## 2015-04-09 ENCOUNTER — Ambulatory Visit (HOSPITAL_BASED_OUTPATIENT_CLINIC_OR_DEPARTMENT_OTHER): Payer: Medicare Other | Admitting: Anesthesiology

## 2015-04-09 ENCOUNTER — Ambulatory Visit (HOSPITAL_BASED_OUTPATIENT_CLINIC_OR_DEPARTMENT_OTHER)
Admission: RE | Admit: 2015-04-09 | Discharge: 2015-04-09 | Disposition: A | Discharge status: Home / Self Care | Payer: Medicare Other | Source: Ambulatory Visit | Attending: General Surgery | Admitting: General Surgery

## 2015-04-09 ENCOUNTER — Encounter (HOSPITAL_BASED_OUTPATIENT_CLINIC_OR_DEPARTMENT_OTHER): Payer: Self-pay | Admitting: Anesthesiology

## 2015-04-09 ENCOUNTER — Encounter (HOSPITAL_BASED_OUTPATIENT_CLINIC_OR_DEPARTMENT_OTHER)
Admission: RE | Disposition: A | Discharge status: Home / Self Care | Payer: Self-pay | Source: Ambulatory Visit | Attending: General Surgery

## 2015-04-09 DIAGNOSIS — N6091 Unspecified benign mammary dysplasia of right breast: Secondary | ICD-10-CM | POA: Insufficient documentation

## 2015-04-09 DIAGNOSIS — Z86010 Personal history of colonic polyps: Secondary | ICD-10-CM | POA: Insufficient documentation

## 2015-04-09 DIAGNOSIS — D0511 Intraductal carcinoma in situ of right breast: Secondary | ICD-10-CM | POA: Diagnosis not present

## 2015-04-09 DIAGNOSIS — K219 Gastro-esophageal reflux disease without esophagitis: Secondary | ICD-10-CM | POA: Insufficient documentation

## 2015-04-09 DIAGNOSIS — N6021 Fibroadenosis of right breast: Secondary | ICD-10-CM | POA: Insufficient documentation

## 2015-04-09 DIAGNOSIS — M1990 Unspecified osteoarthritis, unspecified site: Secondary | ICD-10-CM | POA: Insufficient documentation

## 2015-04-09 DIAGNOSIS — N6011 Diffuse cystic mastopathy of right breast: Secondary | ICD-10-CM | POA: Insufficient documentation

## 2015-04-09 DIAGNOSIS — Z803 Family history of malignant neoplasm of breast: Secondary | ICD-10-CM | POA: Diagnosis not present

## 2015-04-09 DIAGNOSIS — N63 Unspecified lump in breast: Secondary | ICD-10-CM | POA: Diagnosis not present

## 2015-04-09 DIAGNOSIS — Z87891 Personal history of nicotine dependence: Secondary | ICD-10-CM | POA: Insufficient documentation

## 2015-04-09 DIAGNOSIS — D0591 Unspecified type of carcinoma in situ of right breast: Secondary | ICD-10-CM | POA: Diagnosis not present

## 2015-04-09 DIAGNOSIS — Z1231 Encounter for screening mammogram for malignant neoplasm of breast: Secondary | ICD-10-CM | POA: Diagnosis not present

## 2015-04-09 HISTORY — PX: BREAST LUMPECTOMY WITH RADIOACTIVE SEED LOCALIZATION: SHX6424

## 2015-04-09 LAB — POCT HEMOGLOBIN-HEMACUE: HEMOGLOBIN: 13.8 g/dL (ref 12.0–15.0)

## 2015-04-09 SURGERY — BREAST LUMPECTOMY WITH RADIOACTIVE SEED LOCALIZATION
Anesthesia: General | Site: Breast | Laterality: Right

## 2015-04-09 MED ORDER — SCOPOLAMINE 1 MG/3DAYS TD PT72
1.0000 | MEDICATED_PATCH | Freq: Once | TRANSDERMAL | Status: DC | PRN
Start: 2015-04-09 — End: 2015-04-09

## 2015-04-09 MED ORDER — OXYCODONE HCL 5 MG/5ML PO SOLN: 5.0000 mg | Freq: Once | ORAL | Status: DC | PRN

## 2015-04-09 MED ORDER — MIDAZOLAM HCL 2 MG/2ML IJ SOLN
1.0000 mg | INTRAMUSCULAR | Status: DC | PRN
  Administered 2015-04-09: 2 mg via INTRAVENOUS

## 2015-04-09 MED ORDER — HYDROMORPHONE HCL 1 MG/ML IJ SOLN
INTRAMUSCULAR | Status: AC
  Filled 2015-04-09: qty 1

## 2015-04-09 MED ORDER — HYDROCODONE-ACETAMINOPHEN 10-325 MG PO TABS: 1.0000 | ORAL_TABLET | Freq: Four times a day (QID) | ORAL | Status: AC | PRN

## 2015-04-09 MED ORDER — LIDOCAINE HCL (CARDIAC) 20 MG/ML IV SOLN
INTRAVENOUS | Status: AC
  Filled 2015-04-09: qty 5

## 2015-04-09 MED ORDER — ONDANSETRON HCL 4 MG/2ML IJ SOLN
INTRAMUSCULAR | Status: AC
  Filled 2015-04-09: qty 2

## 2015-04-09 MED ORDER — ONDANSETRON HCL 4 MG/2ML IJ SOLN
INTRAMUSCULAR | Status: DC | PRN
  Administered 2015-04-09: 4 mg via INTRAVENOUS

## 2015-04-09 MED ORDER — FENTANYL CITRATE (PF) 100 MCG/2ML IJ SOLN
50.0000 ug | INTRAMUSCULAR | Status: DC | PRN
  Administered 2015-04-09 (×2): 50 ug via INTRAVENOUS

## 2015-04-09 MED ORDER — DEXAMETHASONE SODIUM PHOSPHATE 4 MG/ML IJ SOLN
INTRAMUSCULAR | Status: DC | PRN
  Administered 2015-04-09: 10 mg via INTRAVENOUS

## 2015-04-09 MED ORDER — BUPIVACAINE HCL (PF) 0.25 % IJ SOLN
INTRAMUSCULAR | Status: DC | PRN
  Administered 2015-04-09: 8 mL

## 2015-04-09 MED ORDER — HYDROMORPHONE HCL 1 MG/ML IJ SOLN
0.2500 mg | INTRAMUSCULAR | Status: DC | PRN
  Administered 2015-04-09 (×2): 0.5 mg via INTRAVENOUS

## 2015-04-09 MED ORDER — CEFAZOLIN SODIUM-DEXTROSE 2-3 GM-% IV SOLR
INTRAVENOUS | Status: AC
  Filled 2015-04-09: qty 50

## 2015-04-09 MED ORDER — LIDOCAINE HCL (CARDIAC) 20 MG/ML IV SOLN
INTRAVENOUS | Status: DC | PRN
  Administered 2015-04-09: 50 mg via INTRAVENOUS

## 2015-04-09 MED ORDER — PROPOFOL 10 MG/ML IV BOLUS
INTRAVENOUS | Status: DC | PRN
  Administered 2015-04-09: 200 mg via INTRAVENOUS

## 2015-04-09 MED ORDER — OXYCODONE HCL 5 MG PO TABS: 5.0000 mg | ORAL_TABLET | Freq: Once | ORAL | Status: DC | PRN

## 2015-04-09 MED ORDER — ONDANSETRON HCL 4 MG/2ML IJ SOLN
4.0000 mg | Freq: Four times a day (QID) | INTRAMUSCULAR | Status: AC | PRN
  Administered 2015-04-09: 4 mg via INTRAVENOUS

## 2015-04-09 MED ORDER — MIDAZOLAM HCL 2 MG/2ML IJ SOLN
INTRAMUSCULAR | Status: AC
Start: 2015-04-09 — End: 2015-04-09
  Filled 2015-04-09: qty 4

## 2015-04-09 MED ORDER — LACTATED RINGERS IV SOLN
INTRAVENOUS | Status: DC
  Administered 2015-04-09: 10 mL/h via INTRAVENOUS
  Administered 2015-04-09: 11:00:00 via INTRAVENOUS

## 2015-04-09 MED ORDER — GLYCOPYRROLATE 0.2 MG/ML IJ SOLN: 0.2000 mg | Freq: Once | INTRAMUSCULAR | Status: DC | PRN

## 2015-04-09 MED ORDER — CEFAZOLIN SODIUM-DEXTROSE 2-3 GM-% IV SOLR
2.0000 g | INTRAVENOUS | Status: AC
  Administered 2015-04-09: 2 g via INTRAVENOUS

## 2015-04-09 MED ORDER — DEXAMETHASONE SODIUM PHOSPHATE 10 MG/ML IJ SOLN
INTRAMUSCULAR | Status: AC
  Filled 2015-04-09: qty 1

## 2015-04-09 MED ORDER — FENTANYL CITRATE (PF) 100 MCG/2ML IJ SOLN
INTRAMUSCULAR | Status: AC
  Filled 2015-04-09: qty 4

## 2015-04-09 SURGICAL SUPPLY — 62 items
APPLIER CLIP 9.375 MED OPEN (MISCELLANEOUS)
APR CLP MED 9.3 20 MLT OPN (MISCELLANEOUS)
BINDER BREAST LRG (GAUZE/BANDAGES/DRESSINGS) ×1 IMPLANT
BINDER BREAST MEDIUM (GAUZE/BANDAGES/DRESSINGS) IMPLANT
BINDER BREAST XLRG (GAUZE/BANDAGES/DRESSINGS) IMPLANT
BINDER BREAST XXLRG (GAUZE/BANDAGES/DRESSINGS) IMPLANT
BLADE SURG 15 STRL LF DISP TIS (BLADE) ×1 IMPLANT
BLADE SURG 15 STRL SS (BLADE) ×2
CANISTER SUC SOCK COL 7IN (MISCELLANEOUS) IMPLANT
CANISTER SUCT 1200ML W/VALVE (MISCELLANEOUS) IMPLANT
CHLORAPREP W/TINT 26ML (MISCELLANEOUS) ×2 IMPLANT
CLIP APPLIE 9.375 MED OPEN (MISCELLANEOUS) IMPLANT
COVER BACK TABLE 60X90IN (DRAPES) ×2 IMPLANT
COVER MAYO STAND STRL (DRAPES) ×2 IMPLANT
COVER PROBE W GEL 5X96 (DRAPES) ×2 IMPLANT
DECANTER SPIKE VIAL GLASS SM (MISCELLANEOUS) IMPLANT
DEVICE DUBIN W/COMP PLATE 8390 (MISCELLANEOUS) ×2 IMPLANT
DRAPE LAPAROSCOPIC ABDOMINAL (DRAPES) ×2 IMPLANT
DRSG TEGADERM 4X4.75 (GAUZE/BANDAGES/DRESSINGS) IMPLANT
ELECT COATED BLADE 2.86 ST (ELECTRODE) ×2 IMPLANT
ELECT REM PT RETURN 9FT ADLT (ELECTROSURGICAL) ×2
ELECTRODE REM PT RTRN 9FT ADLT (ELECTROSURGICAL) ×1 IMPLANT
GLOVE BIO SURGEON STRL SZ 6.5 (GLOVE) ×1 IMPLANT
GLOVE BIO SURGEON STRL SZ7 (GLOVE) ×4 IMPLANT
GLOVE BIO SURGEON STRL SZ7.5 (GLOVE) ×1 IMPLANT
GLOVE BIOGEL PI IND STRL 7.0 (GLOVE) IMPLANT
GLOVE BIOGEL PI IND STRL 7.5 (GLOVE) ×1 IMPLANT
GLOVE BIOGEL PI INDICATOR 7.0 (GLOVE) ×2
GLOVE BIOGEL PI INDICATOR 7.5 (GLOVE) ×1
GLOVE SURG SS PI 7.0 STRL IVOR (GLOVE) ×1 IMPLANT
GOWN STRL REUS W/ TWL LRG LVL3 (GOWN DISPOSABLE) ×2 IMPLANT
GOWN STRL REUS W/ TWL XL LVL3 (GOWN DISPOSABLE) IMPLANT
GOWN STRL REUS W/TWL LRG LVL3 (GOWN DISPOSABLE) ×4
GOWN STRL REUS W/TWL XL LVL3 (GOWN DISPOSABLE) ×2
ILLUMINATOR WAVEGUIDE N/F (MISCELLANEOUS) IMPLANT
KIT MARKER MARGIN INK (KITS) ×2 IMPLANT
LIGHT WAVEGUIDE WIDE FLAT (MISCELLANEOUS) IMPLANT
LIQUID BAND (GAUZE/BANDAGES/DRESSINGS) ×2 IMPLANT
MARKER SKIN DUAL TIP RULER LAB (MISCELLANEOUS) ×2 IMPLANT
NDL HYPO 25X1 1.5 SAFETY (NEEDLE) ×1 IMPLANT
NEEDLE HYPO 25X1 1.5 SAFETY (NEEDLE) ×2 IMPLANT
NS IRRIG 1000ML POUR BTL (IV SOLUTION) IMPLANT
PACK BASIN DAY SURGERY FS (CUSTOM PROCEDURE TRAY) ×2 IMPLANT
PENCIL BUTTON HOLSTER BLD 10FT (ELECTRODE) ×2 IMPLANT
SLEEVE SCD COMPRESS KNEE MED (MISCELLANEOUS) ×2 IMPLANT
SPONGE GAUZE 4X4 12PLY STER LF (GAUZE/BANDAGES/DRESSINGS) IMPLANT
SPONGE LAP 4X18 X RAY DECT (DISPOSABLE) ×2 IMPLANT
STAPLER VISISTAT 35W (STAPLE) IMPLANT
STRIP CLOSURE SKIN 1/2X4 (GAUZE/BANDAGES/DRESSINGS) ×2 IMPLANT
SUT MNCRL AB 4-0 PS2 18 (SUTURE) ×2 IMPLANT
SUT MON AB 5-0 PS2 18 (SUTURE) IMPLANT
SUT SILK 2 0 SH (SUTURE) IMPLANT
SUT VIC AB 2-0 SH 27 (SUTURE) ×2
SUT VIC AB 2-0 SH 27XBRD (SUTURE) ×1 IMPLANT
SUT VIC AB 3-0 SH 27 (SUTURE) ×2
SUT VIC AB 3-0 SH 27X BRD (SUTURE) ×1 IMPLANT
SUT VIC AB 5-0 PS2 18 (SUTURE) IMPLANT
SYR CONTROL 10ML LL (SYRINGE) ×2 IMPLANT
TOWEL OR 17X24 6PK STRL BLUE (TOWEL DISPOSABLE) ×2 IMPLANT
TOWEL OR NON WOVEN STRL DISP B (DISPOSABLE) ×2 IMPLANT
TUBE CONNECTING 20X1/4 (TUBING) IMPLANT
YANKAUER SUCT BULB TIP NO VENT (SUCTIONS) IMPLANT

## 2015-04-09 NOTE — Op Note (Signed)
Preoperative diagnosis: right breast dcis, clinical stage 0 Postoperative diagnosis: same as above Procedure: right breast radioactive seed guided lumpectomy Surgeon: Dr Serita Grammes Anesthesia: general EBL: minimal Drains none Complications none Specimen right breast tissue marked with paint, additional medial margin marked short superior long lateral and double deep, additional superomedial margin marked the same Sponge and needle count was correct to completion Suspicion to recovery stable  Indications: This is 54 yof diagnosed with right breast dcis.  We discussed options at our multidisciplinary clinic and decided to proceed with lumpectomy.  She had prior reduction.  The seed was placed prior to beginning. I had the mamograms in the OR. I discussed with radiology Dr Marcelo Baldy prior and the seed had migrated after placement about 1.5 cm lateral to the clip.   Procedure: After informed consent was obtained the patient was taken to the operating room. She was given antibiotics. Sequential compression devices were on her legs. She was then placed under general anesthesia without complication. She was prepped and draped in the standard sterile surgical fashion. A surgical timeout was then performed.  I infiltrated quarter percent Marcaine overlying the seed and used a portion of the inferior aspect of her prior reduction incision.  I then used the neoprobe to identify the radioactive seed and then removed this.I made attempt to remove enough tissue to ensure clip removal. The mammogram did contain the seed but did not have the clip.  I then removed additional margins where I thought this might be and the clip was present in this.  I removed a little more tissue to try to obtain negative margins. Hemostasis was observed. There was no radioactivity remaining in the breast. I then closed this with 2-0 Vicryl, 3-0 Vicryl, and 4-0 Monocryl. Glue was placed over the wound.She tolerated this well was  extubated and transferred to recovery in stable condition

## 2015-04-09 NOTE — H&P (Signed)
69 yof who has history of bilateral reduction in 2003 presents after undergoing screening mm that shows a small right breast mass. on [place] this is 7 mm. on [place] her axilla is negative. she has family history. she does not have any breast complaints. this underwent core biopsy showing a low grade dcis that is er/pr positive. she has no prior breast issues.   Other Problems Conni Slipper, RN; 03/26/2015 7:54 AM) Arthritis Breast Cancer Gastroesophageal Reflux Disease Lump In Breast Thyroid Disease  Past Surgical History Conni Slipper, RN; 03/26/2015 7:54 AM) Breast Biopsy Right. Carotid Artery Surgery Left. Cataract Surgery Bilateral. Colon Polyp Removal - Colonoscopy Mammoplasty; Reduction Bilateral. Oral Surgery Shoulder Surgery Right. Thyroid Surgery Tonsillectomy  Diagnostic Studies History Conni Slipper, RN; 03/26/2015 7:54 AM) Colonoscopy within last year Mammogram within last year Pap Smear 1-5 years ago  Medication History Conni Slipper, RN; 03/26/2015 7:54 AM) No Current Medications Medications Reconciled  Social History Conni Slipper, RN; 03/26/2015 7:54 AM) Alcohol use Remotely quit alcohol use. Caffeine use Carbonated beverages, Coffee, Tea. No drug use Tobacco use Former smoker.  Family History Conni Slipper, RN; 03/26/2015 7:54 AM) Arthritis Family Members In General, Mother. Breast Cancer Family Members In General. Colon Cancer Family Members In General. Heart Disease Father. Heart disease in female family member before age 31 Hypertension Mother.  Pregnancy / Birth History Conni Slipper, RN; 03/26/2015 7:54 AM) Age at menarche 36 years. Age of menopause 86-50 Contraceptive History Oral contraceptives. Gravida 2 Irregular periods Maternal age 68-20 Para 2   Review of Systems Conni Slipper RN; 03/26/2015 7:54 AM) General Not Present- Appetite Loss, Chills, Fatigue, Fever, Night Sweats, Weight Gain and Weight Loss. Skin  Not Present- Change in Wart/Mole, Dryness, Hives, Jaundice, New Lesions, Non-Healing Wounds, Rash and Ulcer. HEENT Present- Wears glasses/contact lenses. Not Present- Earache, Hearing Loss, Hoarseness, Nose Bleed, Oral Ulcers, Ringing in the Ears, Seasonal Allergies, Sinus Pain, Sore Throat, Visual Disturbances and Yellow Eyes. Respiratory Present- Wheezing. Not Present- Bloody sputum, Chronic Cough, Difficulty Breathing and Snoring. Breast Not Present- Breast Mass, Breast Pain, Nipple Discharge and Skin Changes. Cardiovascular Not Present- Chest Pain, Difficulty Breathing Lying Down, Leg Cramps, Palpitations, Rapid Heart Rate, Shortness of Breath and Swelling of Extremities. Gastrointestinal Not Present- Abdominal Pain, Bloating, Bloody Stool, Change in Bowel Habits, Chronic diarrhea, Constipation, Difficulty Swallowing, Excessive gas, Gets full quickly at meals, Hemorrhoids, Indigestion, Nausea, Rectal Pain and Vomiting. Female Genitourinary Not Present- Frequency, Nocturia, Painful Urination, Pelvic Pain and Urgency. Musculoskeletal Not Present- Back Pain, Joint Pain, Joint Stiffness, Muscle Pain, Muscle Weakness and Swelling of Extremities. Neurological Not Present- Decreased Memory, Fainting, Headaches, Numbness, Seizures, Tingling, Tremor, Trouble walking and Weakness. Psychiatric Not Present- Anxiety, Bipolar, Change in Sleep Pattern, Depression, Fearful and Frequent crying. Endocrine Not Present- Cold Intolerance, Excessive Hunger, Hair Changes, Heat Intolerance, Hot flashes and New Diabetes. Hematology Present- Easy Bruising. Not Present- Excessive bleeding, Gland problems, HIV and Persistent Infections.   Physical Exam Rolm Bookbinder MD; 03/26/2015 1:27 PM) General Mental Status-Alert. General Appearance-Anxious. Chest and Lung Exam Chest and lung exam reveals -on auscultation, normal breath sounds, no adventitious sounds and normal vocal resonance. Breast Nipples-No  Discharge. Breast Lump-No Palpable Breast Mass. Cardiovascular Cardiovascular examination reveals -normal heart sounds, regular rate and rhythm with no murmurs. Lymphatic Head & Neck General Head & Neck Lymphatics: Bilateral - Description - Normal. Axillary General Axillary Region: Bilateral - Description - Normal.   Assessment & Plan Rolm Bookbinder MD; 03/26/2015 1:31 PM) DCIS (DUCTAL CARCINOMA IN SITU) (D05.10) Story:  Right breast radioactive seed guided lumpectomy  We discussed the staging and pathophysiology of breast cancer. We discussed all of the different options for treatment for breast cancer including surgery, chemotherapy, radiation therapy, Herceptin, and antiestrogen therapy. We discussed a sentinel lymph node biopsy which I do not think is indicated given her dcis. I did discuss if we were to find invasive disease on final specimen we may need to return to or for sn biopsy. We discussed the options for treatment of the breast cancer which included lumpectomy versus a mastectomy. We discussed the performance of the lumpectomy with radioactive seed placement. We discussed a 5% chance of a positive margin requiring reexcision in the operating room. We also discussed that she will likely need radiation therapy (this is usually 5-7 weeks) if she undergoes lumpectomy. Plan is for dcis oncotype on path to help with that decision We discussed the mastectomy (removal of whole breast) and the postoperative care for that as well. Mastectomy can be followed by reconstruction. This is a more extensive surgery and requires more recovery. The decision for lumpectomy vs mastectomy has no impact on decision for chemotherapy. Most mastectomy patients will not need radiation therapy. We discussed that there is no difference in her survival whether she undergoes lumpectomy with or without radiation pending oncotype for dcis. risks and recovery from surgery discussed. will proceed. she will get  genetic testing but we discussed options and I did not recommend any more surgery than the lumpectomy and she is comfortable with that

## 2015-04-09 NOTE — Discharge Instructions (Signed)
[place] Office Phone Number [phone]   POST OP INSTRUCTIONS  Always review your discharge instruction sheet given to you by the facility where your surgery was performed.  IF YOU HAVE DISABILITY OR FAMILY LEAVE FORMS, YOU MUST BRING THEM TO THE OFFICE FOR PROCESSING.  DO NOT GIVE THEM TO YOUR DOCTOR.  1. A prescription for pain medication may be given to you upon discharge.  Take your pain medication as prescribed, if needed.  If narcotic pain medicine is not needed, then you may take acetaminophen (Tylenol), naprosyn (Alleve) or ibuprofen (Advil) as needed. 2. Take your usually prescribed medications unless otherwise directed 3. If you need a refill on your pain medication, please contact your pharmacy.  They will contact our office to request authorization.  Prescriptions will not be filled after 5pm or on week-ends. 4. You should eat very light the first 24 hours after surgery, such as soup, crackers, pudding, etc.  Resume your normal diet the day after surgery. 5. Most patients will experience some swelling and bruising in the breast.  Ice packs and a good support bra will help.  Wear the breast binder provided or a sports bra for 72 hours day and night.  After that wear a sports bra during the day until you return to the office. Swelling and bruising can take several days to resolve.  6. It is common to experience some constipation if taking pain medication after surgery.  Increasing fluid intake and taking a stool softener will usually help or prevent this problem from occurring.  A mild laxative (Milk of Magnesia or Miralax) should be taken according to package directions if there are no bowel movements after 48 hours. 7. Unless discharge instructions indicate otherwise, you may remove your bandages 48 hours after surgery and you may shower at that time.  You may have steri-strips (small skin tapes) in place directly over the incision.  These strips should be left on the  skin for 7-10 days and will come off on their own.  If your surgeon used skin glue on the incision, you may shower in 24 hours.  The glue will flake off over the next 2-3 weeks.  Any sutures or staples will be removed at the office during your follow-up visit. 8. ACTIVITIES:  You may resume regular daily activities (gradually increasing) beginning the next day.  Wearing a good support bra or sports bra minimizes pain and swelling.  You may have sexual intercourse when it is comfortable. a. You may drive when you no longer are taking prescription pain medication, you can comfortably wear a seatbelt, and you can safely maneuver your car and apply brakes. b. RETURN TO WORK:  ______________________________________________________________________________________ 9. You should see your doctor in the office for a follow-up appointment approximately two weeks after your surgery.  Your doctors nurse will typically make your follow-up appointment when she calls you with your pathology report.  Expect your pathology report 3-4 business days after your surgery.  You may call to check if you do not hear from [place] after three days. 10. OTHER INSTRUCTIONS: _______________________________________________________________________________________________ _____________________________________________________________________________________________________________________________________ _____________________________________________________________________________________________________________________________________ _____________________________________________________________________________________________________________________________________  WHEN TO CALL DR WAKEFIELD: 1. Fever over 101.0 2. Nausea and/or vomiting. 3. Extreme swelling or bruising. 4. Continued bleeding from incision. 5. Increased pain, redness, or drainage from the incision.  The clinic staff is available to answer your questions during regular  business hours.  Please dont hesitate to call and ask to speak to one of the nurses for clinical concerns.  If you have a medical emergency, go to the nearest emergency room or call 911.  A surgeon from [facility] is always on call at the hospital. ° °For further questions, please visit [email or web address] ° ° ° ° °Post Anesthesia Home Care Instructions ° °Activity: °Get plenty of rest for the remainder of the day. A responsible adult should stay with you for 24 hours following the procedure.  °For the next 24 hours, DO NOT: °-Drive a car °-Operate machinery °-Drink alcoholic beverages °-Take any medication unless instructed by your physician °-Make any legal decisions or sign important papers. ° °Meals: °Start with liquid foods such as gelatin or soup. Progress to regular foods as tolerated. Avoid greasy, spicy, heavy foods. If nausea and/or vomiting occur, drink only clear liquids until the nausea and/or vomiting subsides. Call your physician if vomiting continues. ° °Special Instructions/Symptoms: °Your throat may feel dry or sore from the anesthesia or the breathing tube placed in your throat during surgery. If this causes discomfort, gargle with warm salt water. The discomfort should disappear within 24 hours. ° °If you had a scopolamine patch placed behind your ear for the management of post- operative nausea and/or vomiting: ° °1. The medication in the patch is effective for 72 hours, after which it should be removed.  Wrap patch in a tissue and discard in the trash. Wash hands thoroughly with soap and water. °2. You may remove the patch earlier than 72 hours if you experience unpleasant side effects which may include dry mouth, dizziness or visual disturbances. °3. Avoid touching the patch. Wash your hands with soap and water after contact with the patch. ° ° °Call your surgeon if you experience:  ° °1.  Fever over 101.0. °2.  Inability to urinate. °3.  Nausea and/or vomiting. °4.   Extreme swelling or bruising at the surgical site. °5.  Continued bleeding from the incision. °6.  Increased pain, redness or drainage from the incision. °7.  Problems related to your pain medication. °8. Any change in color, movement and/or sensation °9. Any problems and/or concerns °  ° °

## 2015-04-09 NOTE — Interval H&P Note (Signed)
History and Physical Interval Note:  04/09/2015 11:05 AM  [place]  has presented today for surgery, with the diagnosis of RIGHT BREAST DCIS  The various methods of treatment have been discussed with the patient and family. After consideration of risks, benefits and other options for treatment, the patient has consented to  Procedure(s): BREAST LUMPECTOMY WITH RADIOACTIVE SEED LOCALIZATION (Right) as a surgical intervention .  The patient's history has been reviewed, patient examined, no change in status, stable for surgery.  I have reviewed the patient's chart and labs.  Questions were answered to the patient's satisfaction.     Evelyn Orr

## 2015-04-09 NOTE — Anesthesia Postprocedure Evaluation (Signed)
Anesthesia Post Note  Patient: Evelyn Orr [facility]  Procedure(s) Performed: Procedure(s) (LRB): BREAST LUMPECTOMY WITH RADIOACTIVE SEED LOCALIZATION (Right)  Anesthesia type: General  Patient location: PACU  Post pain: Pain level controlled and Adequate analgesia  Post assessment: Post-op Vital signs reviewed, Patient's Cardiovascular Status Stable, Respiratory Function Stable, Patent Airway and Pain level controlled  Last Vitals:  Filed Vitals:   04/09/15 1330  BP: 137/82  Pulse: 75  Temp:   Resp: 13    Post vital signs: Reviewed and stable  Level of consciousness: awake, alert  and oriented  Complications: No apparent anesthesia complications

## 2015-04-09 NOTE — Anesthesia Preprocedure Evaluation (Signed)
Anesthesia Evaluation  Patient identified by MRN, date of birth, ID band Patient awake    Reviewed: Allergy & Precautions, NPO status , Patient's Chart, lab work & pertinent test results  History of Anesthesia Complications (+) PONV  Airway Mallampati: II   Neck ROM: full    Dental   Pulmonary former smoker,    breath sounds clear to auscultation       Cardiovascular negative cardio ROS   Rhythm:regular Rate:Normal     Neuro/Psych    GI/Hepatic   Endo/Other    Renal/GU      Musculoskeletal  (+) Arthritis ,   Abdominal   Peds  Hematology   Anesthesia Other Findings   Reproductive/Obstetrics Breast CA.                             Anesthesia Physical Anesthesia Plan  ASA: II  Anesthesia Plan: General   Post-op Pain Management:    Induction: Intravenous  Airway Management Planned: LMA  Additional Equipment:   Intra-op Plan:   Post-operative Plan:   Informed Consent: I have reviewed the patients History and Physical, chart, labs and discussed the procedure including the risks, benefits and alternatives for the proposed anesthesia with the patient or authorized representative who has indicated his/her understanding and acceptance.     Plan Discussed with: CRNA, Anesthesiologist and Surgeon  Anesthesia Plan Comments:         Anesthesia Quick Evaluation

## 2015-04-09 NOTE — Transfer of Care (Signed)
Immediate Anesthesia Transfer of Care Note  Patient: Evelyn Orr  Procedure(s) Performed: Procedure(s): BREAST LUMPECTOMY WITH RADIOACTIVE SEED LOCALIZATION (Right)  Patient Location: PACU  Anesthesia Type:General  Level of Consciousness: awake  Airway & Oxygen Therapy: Patient Spontanous Breathing and Patient connected to face mask oxygen  Post-op Assessment: Report given to RN and Post -op Vital signs reviewed and stable  Post vital signs: Reviewed and stable  Last Vitals:  Filed Vitals:   04/09/15 1046  BP: 133/73  Pulse: 81  Temp: 37.1 C  Resp: 20    Complications: No apparent anesthesia complications

## 2015-04-09 NOTE — Anesthesia Procedure Notes (Signed)
Procedure Name: LMA Insertion Date/Time: 04/09/2015 11:28 AM Performed by: Lieutenant Diego Pre-anesthesia Checklist: Patient identified, Emergency Drugs available, Suction available and Patient being monitored Patient Re-evaluated:Patient Re-evaluated prior to inductionOxygen Delivery Method: Circle System Utilized Preoxygenation: Pre-oxygenation with 100% oxygen Intubation Type: IV induction Ventilation: Mask ventilation without difficulty LMA: LMA inserted LMA Size: 4.0 Number of attempts: 1 Airway Equipment and Method: Bite block Placement Confirmation: positive ETCO2 and breath sounds checked- equal and bilateral Tube secured with: Tape Dental Injury: Teeth and Oropharynx as per pre-operative assessment

## 2015-04-10 ENCOUNTER — Encounter (HOSPITAL_BASED_OUTPATIENT_CLINIC_OR_DEPARTMENT_OTHER): Payer: Self-pay | Admitting: General Surgery

## 2015-04-11 ENCOUNTER — Ambulatory Visit: Payer: Self-pay | Admitting: Genetic Counselor

## 2015-04-11 ENCOUNTER — Telehealth: Payer: Self-pay | Admitting: Genetic Counselor

## 2015-04-11 DIAGNOSIS — Z1379 Encounter for other screening for genetic and chromosomal anomalies: Secondary | ICD-10-CM

## 2015-04-11 NOTE — Telephone Encounter (Signed)
Discussed with [name] that her genetic test result was negative for pathogenic mutations within any of 20 genes that would cause her to be at an increased risk for breast, ovarian, or other related cancers.  One variant of uncertain significance was found in one copy of the PALB2 gene.  We reviewed why we treat this as a negative result until it gets reclassified by the lab.  We also discussed that it would be helpful for other affected family members to have genetic counseling and testing.  [name] brother (with a history of prostate cancer) and her two maternal cousins (histories of breast cancer; one in her late 70s) would be eligible for genetic counseling and testing.  [name] said she would pass along this information to them.  Her brother lives outside of [place], so I advised using the [email or web address] website to find the closest cancer genetic counselor to him.  We also discussed that this result does not change anything regarding [name]'s cancer screening in the future.  However, women in the family are still considered to be at an increased risk for breast cancer and should continue annual mammogram screening or start annual mammograms at the age of 4, if they haven't already.  [name] and her family members are welcome to call me with any questions, or to schedule an appointment.

## 2015-04-14 DIAGNOSIS — Z1379 Encounter for other screening for genetic and chromosomal anomalies: Secondary | ICD-10-CM | POA: Insufficient documentation

## 2015-04-14 NOTE — Progress Notes (Signed)
GENETIC TEST RESULT  HPI: Evelyn Orr was previously seen in the [place] clinic due to a personal history of breast cancer, family history of breast, prostate, and other cancers, and concerns regarding a hereditary predisposition to cancer. Please refer to our prior cancer genetics clinic note from April 01, 2015 for more information regarding Evelyn Orr's medical, social and family histories, and our assessment and recommendations, at the time. Evelyn Orr recent genetic test results were disclosed to her, as were recommendations warranted by these results. These results and recommendations are discussed in more detail below.  GENETIC TEST RESULTS: At the time of Evelyn Orr's visit on 04/01/15, we recommended she pursue genetic testing of the 20-gene Breast/Ovarian Cancer Panel through [facility].  The Breast/Ovarian Cancer Panel offered by [facility] Hope Pigeon, MD) includes sequencing and deletion/duplication analysis for the following 19 genes:  ATM, BARD1, BRCA1, BRCA2, BRIP1, CDH1, CHEK2, FANCC, MLH1, MSH2, MSH6, NBN, PALB2, PMS2, PTEN, RAD51C, RAD51D, TP53, and XRCC2.  This panel also includes deletion/duplication analysis (without sequencing) for one gene, EPCAM.  Those results are now back, the report date for which is April 10, 2015.  Genetic testing was normal, and did not reveal a deleterious mutation in these genes.  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  The test report will be scanned into EPIC and will be located under the Results Review tab in the Pathology>Molecular Pathology section.   Genetic testing did identify a variant of uncertain significance (VUS) called "c.704C>A (p.Thr235Lys)" in one copy of the PALB2 gene. At this time, it is unknown if this VUS is associated with an increased risk for cancer or if this is a normal finding. Since this VUS  result is uncertain, it cannot help guide screening recommendations, and family members should not be tested for this VUS to help define their own cancer risks.  Also, we all have variants within our genes that make [place] unique individuals--most of these variants are benign.  Thus, we treat this VUS as a negative result.   With time, we suspect the lab will reclassify this variant and when they do, we will try to re-contact Evelyn Orr to discuss the reclassification further.  We also encouraged Evelyn Orr to contact us in a year or two to obtain an update on the status of this VUS.  We discussed with Evelyn Orr that since the current genetic testing is not perfect, it is possible there may be a gene mutation in one of these genes that current testing cannot detect, but that chance is small. We also discussed, that it is possible that another gene that has not yet been discovered, or that we have not yet tested, is responsible for the cancer diagnoses in the family, and it is, therefore, important to remain in touch with cancer genetics in the future so that we can continue to offer Evelyn Orr the most up to date genetic testing.   CANCER SCREENING RECOMMENDATIONS:  This result may or may not be reassuring for [place]; it would be helpful to test additional relatives, such as Evelyn Orr's brother who has had prostate cancer or her two maternal first cousins who were diagnosed with breast cancer in their 47s and 69s.  Testing for these relatives may help [place] to determine the significance of this PALB2 VUS or potentially a different possible explanation for the family history of cancer that Evelyn Orr herself did not inherit.  Thus, this result could mean a few different things.  It could be that this VUS explains the personal or family history.  This could be a totally benign result (as are most VUSes) instead.  Potentially all of the family history of cancer is caused by a hereditary cancer  syndrome that Evelyn Orr herself did not inherit (especially since her parents did not have cancer and she was diagnosed with breast cancer at 78).  However, we cannot rule out a genetic cause at this Orr in time, so further familial genetic testing would be helpful.  Evelyn Orr should continue to follow the cancer management and screening guidelines provided by her oncology and primary healthcare providers, since this VUS result cannot inform medical management at this time.    RECOMMENDATIONS FOR FAMILY MEMBERS: Women in this family might be at some increased risk of developing cancer, over the general population risk, simply due to the family history of cancer. We recommended women in this family have a yearly mammogram beginning at age 33, or 20 years younger than the earliest onset of cancer, an an annual clinical breast exam, and perform monthly breast self-exams. Women in this family should also have a gynecological exam as recommended by their primary provider. All family members should have a colonoscopy by age 91.  Men in the family should follow their doctors' recommendations for prostate cancer screening.    Based on Evelyn Orr's family history, we recommended her brother, who was diagnosed with prostate cancer at age 73, as well as Evelyn Orr's maternal first cousins who were diagnosed with breast cancers in their 55s or at 6 have genetic counseling and testing. Evelyn Orr will let us know if we can be of any assistance in coordinating genetic counseling and/or testing for these family members.  They can also use the [employer] website ([employer]) to search for local cancer genetic counselors by zip code.  FOLLOW-UP: Lastly, we discussed with Evelyn Orr that cancer genetics is a rapidly advancing field and it is possible that new genetic tests will be appropriate for her and/or her family members in the future. We encouraged her to  remain in contact with cancer genetics on an annual basis so we can update her personal and family histories and let her know of advances in cancer genetics that may benefit this family.   Our contact number was provided. Evelyn Orr questions were answered to her satisfaction, and she knows she is welcome to call us at anytime with additional questions or concerns.   Jeanine Luz, MS Genetic Counselor [email or web address] Phone: [phone]

## 2015-04-16 ENCOUNTER — Encounter: Payer: Self-pay | Admitting: *Deleted

## 2015-04-16 NOTE — Progress Notes (Unsigned)
Ordered DCIS oncotype per Dr. Pablo Ledger.  Faxed requisition to pathology and confirmed receipt and faxed PA to [facility].

## 2015-04-28 ENCOUNTER — Encounter: Payer: Self-pay | Admitting: Hematology

## 2015-04-28 ENCOUNTER — Ambulatory Visit (HOSPITAL_BASED_OUTPATIENT_CLINIC_OR_DEPARTMENT_OTHER): Payer: Medicare Other | Admitting: Hematology

## 2015-04-28 ENCOUNTER — Telehealth: Payer: Self-pay | Admitting: Hematology

## 2015-04-28 VITALS — BP 140/87 | HR 83 | Temp 97.8°F | Resp 18 | Ht 62.0 in | Wt 171.6 lb

## 2015-04-28 DIAGNOSIS — Z170 Estrogen receptor positive status [ER+]: Secondary | ICD-10-CM | POA: Diagnosis not present

## 2015-04-28 DIAGNOSIS — C50411 Malignant neoplasm of upper-outer quadrant of right female breast: Secondary | ICD-10-CM

## 2015-04-28 DIAGNOSIS — D0511 Intraductal carcinoma in situ of right breast: Secondary | ICD-10-CM

## 2015-04-28 NOTE — Telephone Encounter (Signed)
per pof to sch pt appt-gave pt copy of avs °

## 2015-04-28 NOTE — Progress Notes (Signed)
[place]  Telephone:([phone]) [phone] Fax:(336) [phone]  Clinic follow Up Note   Patient Care Team: [name], MD as PCP - General (Internal Medicine) Earnstine Regal, PA-C as Physician Assistant (Obstetrics and Gynecology) Rolm Bookbinder, MD as Consulting Physician (General Surgery) Truitt Merle, MD as Consulting Physician (Hematology) Thea Silversmith, MD as Consulting Physician (Radiation Oncology) Mauro Kaufmann, RN as Registered Nurse Rockwell Germany, RN as Registered Nurse Sylvan Cheese, NP as Nurse Practitioner (Hematology and Oncology) 04/28/2015  CHIEF COMPLAINTS:  Follow up right breast DCIS  Oncology History   Breast cancer of upper-outer quadrant of right female breast [facility])   Staging form: Breast, AJCC 7th Edition     Clinical stage from 03/26/2015: Stage 0 (Tis (DCIS), N0, M0) - Unsigned       Breast cancer of upper-outer quadrant of right female breast ([place])   03/13/2015 Mammogram 7 mm oval mass in the right breast is suspicious for malignancy.   03/20/2015 Initial Diagnosis Breast cancer of upper-outer quadrant of right female breast ([place])   03/20/2015 Initial Biopsy Breast, right, needle core biopsy DUCTAL CARCINOMA IN SITU IN PAPILLOMA, LOW GRADE   03/20/2015 Receptors her2 Estrogen Receptor: 95%, POSITIVE, STRONG STAINING INTENSITY Progesterone Receptor: 95%, POSITIVE, STRONG STAINING INTENSITY   04/09/2015 Surgery Right breast lumpectomy   04/09/2015 Pathology Results Right breast lumpectomy showed low-grade DCIS, margins were negative. Fibrocystic changes with adenosis.    HISTORY OF PRESENTING ILLNESS:  Evelyn Orr [place] 69 y.o. female is here because of her newly diagnosed right breast DCIS. She presents to our multidisciplinary breast clinic today with her husband and other family members.  This was discovered by screening mammogram. She did not have any palpable breast or axillary masses. She denies any new symptoms. She  feels well, no complains, she is physically active, she exercises 4 time a week, lives with her husband.  She had bilateral breast reduction in 2003. She has multiple family members who had history of breast cancer.  INTERIM HISTORY  Ms. Longmire returns for follow-up. She underwent right breast lumpectomy on April 09 2015. She tolerated the surgery very well , has minimal residual pain at the incision site , no other complaints. Her energy level and appetite has back to normal. She feels well overall.  MEDICAL HISTORY:  Past Medical History  Diagnosis Date  . Arthritis   . Thyroid disease     hypothyroidism  . Rheumatoid arthritis(714.0)   . PONV (postoperative nausea and vomiting)   . Breast cancer of upper-outer quadrant of right female breast ([place]) 03/20/2015    SURGICAL HISTORY: Past Surgical History  Procedure Laterality Date  . Breast reduction surgery  2001  . Thyroidectomy      age 64  . Wrist arthrodesis  2003    left-rt knee injection  . Shoulder arthroscopy w/ rotator cuff repair  2006    right  . Carpal tunnel release  2006    rt  . Cervical fusion  2009  . Carpal tunnel release      left  . Knee arthroscopy  2004    right  . Colonoscopy      2002,2005,2008 w/[facility]  . Gastrocnemius recession Left 10/11/2013    Procedure: LEFT GASTROC RECESSION; LEFT POSTERIOR TIBIAL TENOLYSIS;  Surgeon: Wylene Simmer, MD;  Location: [place];  Service: Orthopedics;  Laterality: Left;  . Calcaneal osteotomy Left 10/11/2013    Procedure: LEFT CALCANEAL OSTEOTOMY;  Surgeon: Wylene Simmer, MD;  Location: [facility];  Service: Orthopedics;  Laterality: Left;  . Removal of implant Left 02/14/2014    Procedure: LEFT FOOT REMOVAL OF DEEP IMPLANT;  Surgeon: Wylene Simmer, MD;  Location: [place];  Service: Orthopedics;  Laterality: Left;  . Polypectomy    . Breast lumpectomy with radioactive seed localization Right 04/09/2015    Procedure:  BREAST LUMPECTOMY WITH RADIOACTIVE SEED LOCALIZATION;  Surgeon: Rolm Bookbinder, MD;  Location: [place];  Service: General;  Laterality: Right;   GYN HISTORY  Menarchal: 14 LMP: 47  Contraceptive: 3-4 years  HRT: no  G2P2: 2 children 60 daughter and 81 son, no breast feeding    SOCIAL HISTORY: Social History   Social History  . Marital Status: Married    Spouse Name: N/A  . Number of Children: N/A  . Years of Education: N/A   Occupational History  . Not on file.   Social History Main Topics  . Smoking status: Former Smoker -- 0.25 packs/day for 21 years    Types: Cigarettes    Quit date: 02/13/1984  . Smokeless tobacco: Never Used  . Alcohol Use: No  . Drug Use: No  . Sexual Activity: Yes    Birth Control/ Protection: Post-menopausal   Other Topics Concern  . Not on file   Social History Narrative    FAMILY HISTORY: Family History  Problem Relation Age of Onset  . Heart disease Father   . Heart attack Father   . Hypertension Mother   . Breast cancer Paternal Aunt     dx. 15 or younger  . Colon cancer Paternal Uncle   . Prostate cancer Maternal Uncle   . Rectal cancer Neg Hx   . Stomach cancer Neg Hx   . Breast cancer Cousin     4 maternal cousin had breast cancer   . Other Sister     high breast density  . Prostate cancer Brother 54    unknown Gleason  . Prostate cancer Maternal Grandfather     dx. older age  . Colon polyps Son 43    colonoscopy for unspecified reason; found some colon polyps, has not had one since  . Prostate cancer Maternal Uncle   . Prostate cancer Maternal Uncle   . Colon cancer Other   . Cancer Other     MGM's sister    ALLERGIES:  has No Known Allergies.  MEDICATIONS:  Current Outpatient Prescriptions  Medication Sig Dispense Refill  . HYDROcodone-acetaminophen (NORCO) 10-325 MG tablet Take 1 tablet by mouth every 6 (six) hours as needed. 10 tablet 0  . levothyroxine (SYNTHROID, LEVOTHROID) 88 MCG  tablet Take 88 mcg by mouth daily.      . Methotrexate Sodium (METHOTREXATE PO) Take by mouth. Pt takes 6 pills once a week for arthritis.     No current facility-administered medications for this visit.    REVIEW OF SYSTEMS:   Constitutional: Denies fevers, chills or abnormal night sweats Eyes: Denies blurriness of vision, double vision or watery eyes Ears, nose, mouth, throat, and face: Denies mucositis or sore throat Respiratory: Denies cough, dyspnea or wheezes Cardiovascular: Denies palpitation, chest discomfort or lower extremity swelling Gastrointestinal:  Denies nausea, heartburn or change in bowel habits Skin: Denies abnormal skin rashes Lymphatics: Denies new lymphadenopathy or easy bruising Neurological:Denies numbness, tingling or new weaknesses Behavioral/Psych: Mood is stable, no new changes  All other systems were reviewed with the patient and are negative.  PHYSICAL EXAMINATION: ECOG PERFORMANCE STATUS: 0 - Asymptomatic  Filed Vitals:   04/28/15 1414  BP: 140/87  Pulse: 83  Temp: 97.8 F (36.6 C)  Resp: 18   Filed Weights   04/28/15 1414  Weight: 171 lb 9.6 oz (77.837 kg)    GENERAL:alert, no distress and comfortable SKIN: skin color, texture, turgor are normal, no rashes or significant lesions EYES: normal, conjunctiva are pink and non-injected, sclera clear OROPHARYNX:no exudate, no erythema and lips, buccal mucosa, and tongue normal  NECK: supple, thyroid normal size, non-tender, without nodularity LYMPH:  no palpable lymphadenopathy in the cervical, axillary or inguinal LUNGS: clear to auscultation and percussion with normal breathing effort HEART: regular rate & rhythm and no murmurs and no lower extremity edema ABDOMEN:abdomen soft, non-tender and normal bowel sounds Musculoskeletal:no cyanosis of digits and no clubbing  PSYCH: alert & oriented x 3 with fluent speech NEURO: no focal motor/sensory deficits Breasts: Breast inspection showed them to  be symmetrical with no nipple discharge.  The incision in the right breast is healing very well , with minimal scar. Palpation of both breasts and axilla revealed no obvious mass that I could appreciate.   LABORATORY DATA:  I have reviewed the data as listed Lab Results  Component Value Date   WBC 5.5 03/26/2015   HGB 13.8 04/09/2015   HCT 38.3 03/26/2015   MCV 94.3 03/26/2015   PLT 305 03/26/2015    Recent Labs  03/26/15 0835  NA 142  K 3.7  CO2 28  GLUCOSE 84  BUN 8.9  CREATININE 0.8  CALCIUM 9.5  PROT 7.6  ALBUMIN 3.6  AST 20  ALT 12  ALKPHOS 101  BILITOT 0.60    Pathology report  Diagnosis 04/09/2015 1. Breast, lumpectomy, right - COMPLEX SCLEROSING LESION SHOWING FIBROCYSTIC CHANGES WITH ADENOSIS, USUAL DUCTAL HYPERPLASIA AND LOW GRADE DUCTAL CARCINOMA IN SITU. - MARGINS ARE NEGATIVE. - SEE ONCOLOGY TEMPLATE. 2. Breast, excision, right medial margin - COMPLEX SCLEROSING LESION WITH FIBROCYSTIC CHANGES. - NO ATYPIA, HYPERPLASIA, OR MALIGNANCY IDENTIFIED. Marland Kitchen 3. Breast, excision, right superior margin - COMPLEX SCLEROSING LESION SHOWING FIBROCYSTIC CHANGES WITH ADENOSIS AND USUAL DUCTAL HYPERPLASIA. - NO ATYPIA OR MALIGNANCY IDENTIFIED. Microscopic Comment 1. BREAST, IN SITU CARCINOMA Specimen, including laterality: Right partial breast with additional right medial and right superior margins. Procedure (include lymph node sampling sentinel-non-sentinel): Right breast lumpectomy with additional right medial and right superior margin excisions. Grade of carcinoma: Low grade. Necrosis: No. Estimated tumor size: (glass slide measurement): Largest residual focus of ductal carcinoma in situ measures 0.3 cm. Treatment effect: Not applicable. Distance to closest margin: Ductal carcinoma in situ is at least 0.4 cm from all margins (closest to medial margin). Breast prognostic profile: Performed on previous case, [phone]. Estrogen receptor: 95%,  positive. Progesterone receptor: 95%, positive. Lymph nodes: No lymph nodes received. TNM: pTis, pNX.   RADIOGRAPHIC STUDIES: I have personally reviewed the radiological images as listed and agreed with the findings in the report. No results found.  ASSESSMENT & PLAN:  69 year old postmenopausal woman, presented with screening discovered DCIS.  1. Right breast DCIS, low grade, ER and PR strongly positive - I discussed her surgical pathology result with her in details.  It showed low-grade DCIS, no evidence of invasive carcinoma. Her surgical margins were negative. -Her DCIS is cured by complete surgical resection. Any form of adjuvant therapy is preventive. -She may benefit from breast radiation. She is scheduled to follow up with Dr. Pablo Ledger this week. Given her low-grade DCIS, Dr. Wilburt Finlay recommended Oncotype DX test. -Given her strong ER/PR  positivity and good baseline health, I recommend adjuvant endocrine therapy with tamoxifen or anastrozole. The benefit (reduce risk of breast cancer by 30-40%, but does not increase overall survival), and the risks of these agents were discussed with her again in great details - she declined tamoxifen due to his concern of its side effects, especially the risk of thrombosis and endometrial cancer.  She is agreeable with anastrozole.  Potential side effects of anastrozole, such as hot flashes, musculoskeletal joint discomfort, skin the vaginal  Dryness, osteoporosis, slightly increased risk of cardiovascular disease, etc. Were discussed with her in great details. -We discussed breast cancer surveillance after she completes treatment, Including annual mammogram, monthly self breast exam, and breast exam by a physician every 6-12 months.  2. Hyperthyroidism and rheumatoid arthritis -She will continue follow-up with her primary care physician. -She is on weekly methotrexate, and her rheumatoid arthritis has been well-controlled.  Plan -I will see her back  after she completes breast radiation , or sooner if  radiation is not offered, to start her on  Anastrozole.   All questions were answered. The patient knows to call the clinic with any problems, questions or concerns. I spent 25 minutes counseling the patient face to face. The total time spent in the appointment was 30 minutes and more than 50% was on counseling.     Truitt Merle, MD 04/28/2015 11:32 PM

## 2015-04-30 NOTE — Progress Notes (Signed)
   Department of Radiation Oncology  Phone:  [phone] Fax:        [phone]   Name: Evelyn Orr MRN: [id number]  DOB: 1945/08/29  Date: 05/01/2015  Follow Up Visit Note  Diagnosis: Breast cancer of upper-outer quadrant of right female breast [facility])   Staging form: Breast, AJCC 7th Edition     Clinical stage from 03/26/2015: Stage 0 (Tis (DCIS), N0, M0) - Unsigned  Interval History: Evelyn Orr presents today for routine followup.  Her genetic testing showed a VUS which is considered negative. She had her lumpectomy on 11/9 which showed only a complex sclerosing lesion with 3 mm of low grade DCIS.  This was 95% ER positive.  The margins were 4 mm. She met with Dr. Burr Medico and agreed to anastrozole. Her pathology specimen was sent for Oncotype but there was insufficient tissue. They have requested the core biopsy and are trying to run that now.   Physical Exam:  Filed Vitals:   05/01/15 0942  BP: 123/82  Pulse: 73  Temp: 98.8 F (37.1 C)  TempSrc: Oral  Resp: 12  Weight: 170 lb 8 oz (77.338 kg)  SpO2: 100%   Pleasant female in no distress. Incision is healing well over the right breast.   IMPRESSION: Evelyn Orr is a 69 y.o. female s/p lumpectomy for low grade DCIS in a pailloma and minimal residual disease at excision.   PLAN:  I have recommended against radiation. We discussed the results of the [facility] nomogram and I have given her a copy of these results. This predicts a 12% chance of local recurrence with surgery alone. Taking anti estrogen treatment cuts this risk in half.  Radiation would not further decrease this risk significantly. I have not recommended radiation for her as she has agreed to antiestrogen therapy and close clinical follow up with mammograms and physical exams.   I will contact her with he results of her Oncotype test and obviously if this comes back high we would proceed with radiation. This, I think, is unlikely.  I will let Dr. Burr Medico know to contact her  with a prescription for AI.  She sees Dr. Donne Hazel in a year.  She will be scheduled with survivorship.   Evelyn Silversmith, MD

## 2015-05-01 ENCOUNTER — Encounter: Payer: Self-pay | Admitting: Radiation Oncology

## 2015-05-01 ENCOUNTER — Ambulatory Visit
Admission: RE | Admit: 2015-05-01 | Discharge: 2015-05-01 | Disposition: A | Discharge status: Home / Self Care | Payer: Medicare Other | Source: Ambulatory Visit | Attending: Radiation Oncology | Admitting: Radiation Oncology

## 2015-05-01 VITALS — BP 123/82 | HR 73 | Temp 98.8°F | Resp 12 | Wt 170.5 lb

## 2015-05-01 DIAGNOSIS — C50411 Malignant neoplasm of upper-outer quadrant of right female breast: Secondary | ICD-10-CM | POA: Insufficient documentation

## 2015-05-01 DIAGNOSIS — Z170 Estrogen receptor positive status [ER+]: Secondary | ICD-10-CM | POA: Diagnosis not present

## 2015-05-01 HISTORY — DX: Malignant neoplasm of unspecified site of unspecified female breast: C50.919

## 2015-05-01 NOTE — Progress Notes (Signed)
Location of Breast Cancer: Right Breast  Histology per Pathology Report:    Receptor Status: ER(+), PR (+), Her2-neu ()  Did patient present with symptoms (if so, please note symptoms) or was this found on screening mammography?: This was discovered by screening mammogram. She did not have any palpable breast or axillary masses  Past/Anticipated interventions by surgeon, if any: Dr. Donne Hazel: Breast lumpectomy with radioactive seed localization: 04/09/2015    Past/Anticipated interventions by medical oncology, if any: Dr. Burr Medico: adjuvant endocrine therapy with tamoxifen or anastrozole. She declined tamoxifen due to his concern of its side effects, especially the risk of thrombosis and endometrial cancer.  She is agreeable with anastrozole.  Lymphedema issues, if any: no   Pain issues, if any:  Occasionally sharp/tingling sensation to right breast  SAFETY ISSUES:  Prior radiation? no  Pacemaker/ICD? no  Possible current pregnancy?no  Is the patient on methotrexate? yes  Current Complaints / other details:  Thyroid disease (hypothyroidism).    Birth/GYN History: Menarchal: 14 LMP: 47   Contraceptive: 3-4 years   HRT: no   G2P2: 2 children 53 daughter and 10 son, 8 grandchildren, one lives with her.  Married. No breast feeding      Jenene Slicker, RN 05/01/2015,9:20 AM

## 2015-05-09 ENCOUNTER — Encounter (HOSPITAL_COMMUNITY): Payer: Self-pay

## 2015-05-09 DIAGNOSIS — D0511 Intraductal carcinoma in situ of right breast: Secondary | ICD-10-CM | POA: Diagnosis not present

## 2015-05-13 ENCOUNTER — Telehealth: Payer: Self-pay | Admitting: *Deleted

## 2015-05-13 NOTE — Telephone Encounter (Signed)
Called back to let patient know she has an appointment with her medical oncologist to have further discussion about her plan of care 07-16-2015 and that medication should be discussed at that time

## 2015-05-13 NOTE — Telephone Encounter (Signed)
Spoke with patient results given.  Patient asked about the 5 year pill Dr. Pablo Ledger to speak with Dr. Burr Medico.  Will speak with MD and call patient back.

## 2015-05-13 NOTE — Telephone Encounter (Signed)
Called to review Oncotype results per Dr. Pablo Ledger.

## 2015-05-14 DIAGNOSIS — M069 Rheumatoid arthritis, unspecified: Secondary | ICD-10-CM | POA: Diagnosis not present

## 2015-05-14 DIAGNOSIS — R7309 Other abnormal glucose: Secondary | ICD-10-CM | POA: Diagnosis not present

## 2015-05-14 DIAGNOSIS — Z6832 Body mass index (BMI) 32.0-32.9, adult: Secondary | ICD-10-CM | POA: Diagnosis not present

## 2015-05-14 DIAGNOSIS — E039 Hypothyroidism, unspecified: Secondary | ICD-10-CM | POA: Diagnosis not present

## 2015-05-19 ENCOUNTER — Encounter: Payer: Self-pay | Admitting: *Deleted

## 2015-05-20 ENCOUNTER — Telehealth: Payer: Self-pay | Admitting: Hematology

## 2015-05-20 NOTE — Telephone Encounter (Signed)
Called patient re scheduling a sooner appointment to discuss meds per 12/19 pof. Not able to reach patient and left message on cell and with relative at the home re patient calling office to schedule a sooner appointment.

## 2015-05-21 ENCOUNTER — Telehealth: Payer: Self-pay | Admitting: Hematology

## 2015-05-21 NOTE — Telephone Encounter (Signed)
PATIENT RETURNED CALL RE COMING IN FOR SOONER APPOINTMENT. GAVE PATIENT NEW APPOINTMENT FOR LAB/YF 12/22 @ 9 AM. PER 12/19 POF R/S LAB/YF TO NEXT AVAILABLE TO DISCUSS MEDICATION. R/S 2/15 APPOINTMENTS TO 12/22. PATIENT AWARE OF NEW DATE/TIME.

## 2015-05-22 ENCOUNTER — Ambulatory Visit (HOSPITAL_BASED_OUTPATIENT_CLINIC_OR_DEPARTMENT_OTHER): Payer: Medicare Other | Admitting: Hematology

## 2015-05-22 ENCOUNTER — Other Ambulatory Visit: Payer: Self-pay | Admitting: *Deleted

## 2015-05-22 ENCOUNTER — Other Ambulatory Visit (HOSPITAL_BASED_OUTPATIENT_CLINIC_OR_DEPARTMENT_OTHER): Payer: Medicare Other

## 2015-05-22 ENCOUNTER — Encounter: Payer: Self-pay | Admitting: Hematology

## 2015-05-22 ENCOUNTER — Telehealth: Payer: Self-pay | Admitting: Hematology

## 2015-05-22 VITALS — BP 137/75 | HR 73 | Temp 98.4°F | Resp 18 | Ht 62.0 in | Wt 168.1 lb

## 2015-05-22 DIAGNOSIS — D0511 Intraductal carcinoma in situ of right breast: Secondary | ICD-10-CM

## 2015-05-22 DIAGNOSIS — Z170 Estrogen receptor positive status [ER+]: Secondary | ICD-10-CM | POA: Diagnosis not present

## 2015-05-22 DIAGNOSIS — C50411 Malignant neoplasm of upper-outer quadrant of right female breast: Secondary | ICD-10-CM

## 2015-05-22 LAB — COMPREHENSIVE METABOLIC PANEL
ALT: 10 U/L (ref 0–55)
AST: 23 U/L (ref 5–34)
Albumin: 3.7 g/dL (ref 3.5–5.0)
Alkaline Phosphatase: 104 U/L (ref 40–150)
Anion Gap: 9 mEq/L (ref 3–11)
BUN: 8.2 mg/dL (ref 7.0–26.0)
CHLORIDE: 107 meq/L (ref 98–109)
CO2: 25 meq/L (ref 22–29)
Calcium: 9.3 mg/dL (ref 8.4–10.4)
Creatinine: 0.8 mg/dL (ref 0.6–1.1)
EGFR: 90 mL/min/{1.73_m2} — AB (ref >90)
GLUCOSE: 85 mg/dL (ref 70–140)
POTASSIUM: 3.8 meq/L (ref 3.5–5.1)
SODIUM: 140 meq/L (ref 136–145)
Total Bilirubin: 0.5 mg/dL (ref 0.20–1.20)
Total Protein: 7.9 g/dL (ref 6.4–8.3)

## 2015-05-22 LAB — CBC WITH DIFFERENTIAL/PLATELET
BASO%: 0.8 % (ref 0.0–2.0)
BASOS ABS: 0 10*3/uL (ref 0.0–0.1)
EOS ABS: 0.1 10*3/uL (ref 0.0–0.5)
EOS%: 2.3 % (ref 0.0–7.0)
HCT: 39.7 % (ref 34.8–46.6)
HEMOGLOBIN: 13.2 g/dL (ref 11.6–15.9)
LYMPH%: 55.1 % — AB (ref 14.0–49.7)
MCH: 31.1 pg (ref 25.1–34.0)
MCHC: 33.2 g/dL (ref 31.5–36.0)
MCV: 93.4 fL (ref 79.5–101.0)
MONO#: 0.3 10*3/uL (ref 0.1–0.9)
MONO%: 5.8 % (ref 0.0–14.0)
NEUT#: 1.9 10*3/uL (ref 1.5–6.5)
NEUT%: 36 % — ABNORMAL LOW (ref 38.4–76.8)
Platelets: 281 10*3/uL (ref 145–400)
RBC: 4.25 10*6/uL (ref 3.70–5.45)
RDW: 13.4 % (ref 11.2–14.5)
WBC: 5.2 10*3/uL (ref 3.9–10.3)
lymph#: 2.9 10*3/uL (ref 0.9–3.3)

## 2015-05-22 MED ORDER — ANASTROZOLE 1 MG PO TABS: 1.0000 mg | ORAL_TABLET | Freq: Every day | ORAL | Status: DC

## 2015-05-22 NOTE — Telephone Encounter (Signed)
Gave pt appt for 07/03/15. S/w Roshundra , scheduled bone density exam for 12/29 @ 10.30am. Pt is aware of this appt.

## 2015-05-22 NOTE — Progress Notes (Signed)
[place]  [phone] Fax:([phone]) [phone]  Clinic follow Up Note   Patient Care Team: [name], MD as PCP - General (Internal Medicine) Earnstine Regal, PA-C as Physician Assistant (Obstetrics and Gynecology) Rolm Bookbinder, MD as Consulting Physician (General Surgery) Truitt Merle, MD as Consulting Physician (Hematology) Thea Silversmith, MD as Consulting Physician (Radiation Oncology) Mauro Kaufmann, RN as Registered Nurse Rockwell Germany, RN as Registered Nurse Sylvan Cheese, NP as Nurse Practitioner (Hematology and Oncology) 05/22/2015  CHIEF COMPLAINTS:  Follow up right breast DCIS  Oncology History   Breast cancer of upper-outer quadrant of right female breast [facility])   Staging form: Breast, AJCC 7th Edition     Clinical stage from 03/26/2015: Stage 0 (Tis (DCIS), N0, M0) - Unsigned       Breast cancer of upper-outer quadrant of right female breast ([place])   03/13/2015 Mammogram 7 mm oval mass in the right breast is suspicious for malignancy.   03/20/2015 Initial Diagnosis Breast cancer of upper-outer quadrant of right female breast ([place])   03/20/2015 Initial Biopsy Breast, right, needle core biopsy DUCTAL CARCINOMA IN SITU IN PAPILLOMA, LOW GRADE   03/20/2015 Receptors her2 Estrogen Receptor: 95%, POSITIVE, STRONG STAINING INTENSITY Progesterone Receptor: 95%, POSITIVE, STRONG STAINING INTENSITY   04/09/2015 Surgery Right breast lumpectomy   04/09/2015 Pathology Results Right breast lumpectomy showed low-grade DCIS, margins were negative. Fibrocystic changes with adenosis.    HISTORY OF PRESENTING ILLNESS:  Evelyn Orr [place] 69 y.o. female is here because of her newly diagnosed right breast DCIS. She presents to our multidisciplinary breast clinic today with her husband and other family members.  This was discovered by screening mammogram. She did not have any palpable breast or axillary masses. She denies any new symptoms. She  feels well, no complains, she is physically active, she exercises 4 time a week, lives with her husband.  She had bilateral breast reduction in 2003. She has multiple family members who had history of breast cancer.  INTERIM HISTORY [name] returns for follow-up. She is doing well. She feels a small bump at her incisional site. She denies any other complaints. She saw Dr. Pablo Ledger and breast radiation was not offered.  MEDICAL HISTORY:  Past Medical History  Diagnosis Date  . Arthritis   . Thyroid disease     hypothyroidism  . Rheumatoid arthritis(714.0)   . PONV (postoperative nausea and vomiting)   . Breast cancer of upper-outer quadrant of right female breast ([place]) 03/20/2015  . Breast cancer [facility])     SURGICAL HISTORY: Past Surgical History  Procedure Laterality Date  . Breast reduction surgery  2001  . Thyroidectomy      age 89  . Wrist arthrodesis  2003    left-rt knee injection  . Shoulder arthroscopy w/ rotator cuff repair  2006    right  . Carpal tunnel release  2006    rt  . Cervical fusion  2009  . Carpal tunnel release      left  . Knee arthroscopy  2004    right  . Colonoscopy      2002,2005,2008 w/[facility]  . Gastrocnemius recession Left 10/11/2013    Procedure: LEFT GASTROC RECESSION; LEFT POSTERIOR TIBIAL TENOLYSIS;  Surgeon: Wylene Simmer, MD;  Location: [place];  Service: Orthopedics;  Laterality: Left;  . Calcaneal osteotomy Left 10/11/2013    Procedure: LEFT CALCANEAL OSTEOTOMY;  Surgeon: Wylene Simmer, MD;  Location: [place];  Service: Orthopedics;  Laterality: Left;  .  Removal of implant Left 02/14/2014    Procedure: LEFT FOOT REMOVAL OF DEEP IMPLANT;  Surgeon: Wylene Simmer, MD;  Location: [place];  Service: Orthopedics;  Laterality: Left;  . Polypectomy    . Breast lumpectomy with radioactive seed localization Right 04/09/2015    Procedure: BREAST LUMPECTOMY WITH RADIOACTIVE SEED LOCALIZATION;   Surgeon: Rolm Bookbinder, MD;  Location: [place];  Service: General;  Laterality: Right;   GYN HISTORY  Menarchal: 14 LMP: 47  Contraceptive: 3-4 years  HRT: no  G2P2: 2 children 47 daughter and 81 son, no breast feeding    SOCIAL HISTORY: Social History   Social History  . Marital Status: Married    Spouse Name: N/A  . Number of Children: N/A  . Years of Education: N/A   Occupational History  . Not on file.   Social History Main Topics  . Smoking status: Former Smoker -- 0.25 packs/day for 21 years    Types: Cigarettes    Quit date: 02/13/1984  . Smokeless tobacco: Never Used  . Alcohol Use: No  . Drug Use: No  . Sexual Activity: Yes    Birth Control/ Protection: Post-menopausal   Other Topics Concern  . Not on file   Social History Narrative    FAMILY HISTORY: Family History  Problem Relation Age of Onset  . Heart disease Father   . Heart attack Father   . Hypertension Mother   . Breast cancer Paternal Aunt     dx. 55 or younger  . Colon cancer Paternal Uncle   . Prostate cancer Maternal Uncle   . Rectal cancer Neg Hx   . Stomach cancer Neg Hx   . Breast cancer Cousin     4 maternal cousin had breast cancer   . Other Sister     high breast density  . Prostate cancer Brother 40    unknown Gleason  . Prostate cancer Maternal Grandfather     dx. older age  . Colon polyps Son 86    colonoscopy for unspecified reason; found some colon polyps, has not had one since  . Prostate cancer Maternal Uncle   . Prostate cancer Maternal Uncle   . Colon cancer Other   . Cancer Other     MGM's sister    ALLERGIES:  has No Known Allergies.  MEDICATIONS:  Current Outpatient Prescriptions  Medication Sig Dispense Refill  . levothyroxine (SYNTHROID, LEVOTHROID) 88 MCG tablet Take 88 mcg by mouth daily.      . Methotrexate Sodium (METHOTREXATE PO) Take by mouth. Pt takes 6 pills once a week for arthritis.    Marland Kitchen anastrozole (ARIMIDEX) 1 MG tablet  Take 1 tablet (1 mg total) by mouth daily. 30 tablet 3  . HYDROcodone-acetaminophen (NORCO) 10-325 MG tablet Take 1 tablet by mouth every 6 (six) hours as needed. (Patient not taking: Reported on 05/01/2015) 10 tablet 0   No current facility-administered medications for this visit.    REVIEW OF SYSTEMS:   Constitutional: Denies fevers, chills or abnormal night sweats Eyes: Denies blurriness of vision, double vision or watery eyes Ears, nose, mouth, throat, and face: Denies mucositis or sore throat Respiratory: Denies cough, dyspnea or wheezes Cardiovascular: Denies palpitation, chest discomfort or lower extremity swelling Gastrointestinal:  Denies nausea, heartburn or change in bowel habits Skin: Denies abnormal skin rashes Lymphatics: Denies new lymphadenopathy or easy bruising Neurological:Denies numbness, tingling or new weaknesses Behavioral/Psych: Mood is stable, no new changes  All other systems were reviewed  with the patient and are negative.  PHYSICAL EXAMINATION: ECOG PERFORMANCE STATUS: 0 - Asymptomatic  Filed Vitals:   05/22/15 0941  BP: 137/75  Pulse: 73  Temp: 98.4 F (36.9 C)  Resp: 18   Filed Weights   05/22/15 0941  Weight: 168 lb 1.6 oz (76.25 kg)    GENERAL:alert, no distress and comfortable SKIN: skin color, texture, turgor are normal, no rashes or significant lesions EYES: normal, conjunctiva are pink and non-injected, sclera clear OROPHARYNX:no exudate, no erythema and lips, buccal mucosa, and tongue normal  NECK: supple, thyroid normal size, non-tender, without nodularity LYMPH:  no palpable lymphadenopathy in the cervical, axillary or inguinal LUNGS: clear to auscultation and percussion with normal breathing effort HEART: regular rate & rhythm and no murmurs and no lower extremity edema ABDOMEN:abdomen soft, non-tender and normal bowel sounds Musculoskeletal:no cyanosis of digits and no clubbing  PSYCH: alert & oriented x 3 with fluent speech NEURO:  no focal motor/sensory deficits Breasts: Breast inspection showed them to be symmetrical with no nipple discharge.  The incision in the right breast is healing very well , there is a 3-4 lump under the surgical scar in the right breast. Palpation of left breasts and axilla revealed no obvious mass that I could appreciate.   LABORATORY DATA:  I have reviewed the data as listed Lab Results  Component Value Date   WBC 5.2 05/22/2015   HGB 13.2 05/22/2015   HCT 39.7 05/22/2015   MCV 93.4 05/22/2015   PLT 281 05/22/2015    Recent Labs  03/26/15 0835  NA 142  K 3.7  CO2 28  GLUCOSE 84  BUN 8.9  CREATININE 0.8  CALCIUM 9.5  PROT 7.6  ALBUMIN 3.6  AST 20  ALT 12  ALKPHOS 101  BILITOT 0.60    Pathology report  Diagnosis 04/09/2015 1. Breast, lumpectomy, right - COMPLEX SCLEROSING LESION SHOWING FIBROCYSTIC CHANGES WITH ADENOSIS, USUAL DUCTAL HYPERPLASIA AND LOW GRADE DUCTAL CARCINOMA IN SITU. - MARGINS ARE NEGATIVE. - SEE ONCOLOGY TEMPLATE. 2. Breast, excision, right medial margin - COMPLEX SCLEROSING LESION WITH FIBROCYSTIC CHANGES. - NO ATYPIA, HYPERPLASIA, OR MALIGNANCY IDENTIFIED. Marland Kitchen 3. Breast, excision, right superior margin - COMPLEX SCLEROSING LESION SHOWING FIBROCYSTIC CHANGES WITH ADENOSIS AND USUAL DUCTAL HYPERPLASIA. - NO ATYPIA OR MALIGNANCY IDENTIFIED. Microscopic Comment 1. BREAST, IN SITU CARCINOMA Specimen, including laterality: Right partial breast with additional right medial and right superior margins. Procedure (include lymph node sampling sentinel-non-sentinel): Right breast lumpectomy with additional right medial and right superior margin excisions. Grade of carcinoma: Low grade. Necrosis: No. Estimated tumor size: (glass slide measurement): Largest residual focus of ductal carcinoma in situ measures 0.3 cm. Treatment effect: Not applicable. Distance to closest margin: Ductal carcinoma in situ is at least 0.4 cm from all margins (closest to medial  margin). Breast prognostic profile: Performed on previous case, [phone]. Estrogen receptor: 95%, positive. Progesterone receptor: 95%, positive. Lymph nodes: No lymph nodes received. TNM: pTis, pNX.   RADIOGRAPHIC STUDIES: I have personally reviewed the radiological images as listed and agreed with the findings in the report. No results found.  ASSESSMENT & PLAN:  69 year old postmenopausal woman, presented with screening discovered DCIS.  1. Right breast DCIS, low grade, ER and PR strongly positive - I discussed her surgical pathology result with her in details.  It showed low-grade DCIS, no evidence of invasive carcinoma. Her surgical margins were negative. -Her DCIS is cured by complete surgical resection. Any form of adjuvant therapy is preventive. -She may benefit from breast  radiation. She is scheduled to follow up with Dr. Pablo Ledger this week. Given her low-grade DCIS, Dr. Wilburt Finlay recommended Oncotype DX test. -Given her strong ER/PR positivity and good baseline health, I recommend adjuvant endocrine therapy with tamoxifen or anastrozole. The benefit (reduce risk of breast cancer by 30-40%, but does not increase overall survival), and the risks of these agents were discussed with her again in great details - she declined tamoxifen due to his concern of its side effects, especially the risk of thrombosis and endometrial cancer.  She is agreeable with anastrozole.  Potential side effects were reviewed with her again, especially muscular and joint discomfort. Her rheumatoid arthritis well controlled. -Breast irradiation was not offered. I'll start her on anastrozole in the next few days. Prescription was called into her pharmacy today. -We discussed breast cancer surveillance after she completes treatment, Including annual mammogram, monthly self breast exam, and breast exam by a physician every 6-12 months.  2. Right breast lump -This is likely a seroma after surgery. -She is  asymptomatic, or monitored clinically. -She is due for mammogram in October 2017  2. Hyperthyroidism and rheumatoid arthritis -She will continue follow-up with her primary care physician. -She is on weekly methotrexate, and her rheumatoid arthritis has been well-controlled.  Plan -start Anastrozole this week -I'll see her back in 6 weeks for follow-up.   All questions were answered. The patient knows to call the clinic with any problems, questions or concerns. I spent 25 minutes counseling the patient face to face. The total time spent in the appointment was 30 minutes and more than 50% was on counseling.     Truitt Merle, MD 05/22/2015 10:08 AM

## 2015-05-23 ENCOUNTER — Telehealth: Payer: Self-pay | Admitting: Hematology

## 2015-05-23 ENCOUNTER — Other Ambulatory Visit: Payer: Self-pay | Admitting: *Deleted

## 2015-05-23 DIAGNOSIS — C50411 Malignant neoplasm of upper-outer quadrant of right female breast: Secondary | ICD-10-CM

## 2015-05-23 NOTE — Telephone Encounter (Signed)
cld pt & left message of appt for 3/28 time & date

## 2015-05-27 ENCOUNTER — Other Ambulatory Visit: Payer: Self-pay | Admitting: *Deleted

## 2015-05-27 ENCOUNTER — Encounter: Payer: Self-pay | Admitting: *Deleted

## 2015-05-27 DIAGNOSIS — C50411 Malignant neoplasm of upper-outer quadrant of right female breast: Secondary | ICD-10-CM

## 2015-05-28 ENCOUNTER — Encounter: Payer: Self-pay | Admitting: *Deleted

## 2015-05-29 DIAGNOSIS — D0591 Unspecified type of carcinoma in situ of right breast: Secondary | ICD-10-CM | POA: Diagnosis not present

## 2015-05-29 DIAGNOSIS — M8589 Other specified disorders of bone density and structure, multiple sites: Secondary | ICD-10-CM | POA: Diagnosis not present

## 2015-05-29 DIAGNOSIS — Z1231 Encounter for screening mammogram for malignant neoplasm of breast: Secondary | ICD-10-CM | POA: Diagnosis not present

## 2015-05-29 DIAGNOSIS — N63 Unspecified lump in breast: Secondary | ICD-10-CM | POA: Diagnosis not present

## 2015-05-29 DIAGNOSIS — Z780 Asymptomatic menopausal state: Secondary | ICD-10-CM | POA: Diagnosis not present

## 2015-06-05 DIAGNOSIS — M8580 Other specified disorders of bone density and structure, unspecified site: Secondary | ICD-10-CM | POA: Insufficient documentation

## 2015-07-03 ENCOUNTER — Other Ambulatory Visit: Payer: Self-pay | Admitting: *Deleted

## 2015-07-03 ENCOUNTER — Encounter: Payer: Self-pay | Admitting: Hematology

## 2015-07-03 ENCOUNTER — Other Ambulatory Visit: Payer: Self-pay

## 2015-07-03 ENCOUNTER — Telehealth: Payer: Self-pay | Admitting: *Deleted

## 2015-07-03 NOTE — Telephone Encounter (Signed)
Left message at pt's home # to call back to discuss missed appt for today.

## 2015-07-03 NOTE — Telephone Encounter (Signed)
Pt called back & states husband thought appt was on the 7 th of Feb.  Informed that we would send an order to r/s missed appt.  She prefers early am.  POF to scheduler

## 2015-07-03 NOTE — Progress Notes (Signed)
This encounter was created in error - please disregard.

## 2015-07-08 ENCOUNTER — Telehealth: Payer: Self-pay | Admitting: Hematology

## 2015-07-08 NOTE — Telephone Encounter (Signed)
Spoke with patient directly re appointments for lab/YF 2/20 @ 9:45 am.

## 2015-07-16 ENCOUNTER — Ambulatory Visit: Payer: Self-pay | Admitting: Hematology

## 2015-07-16 ENCOUNTER — Other Ambulatory Visit: Payer: Self-pay

## 2015-07-21 ENCOUNTER — Other Ambulatory Visit (HOSPITAL_BASED_OUTPATIENT_CLINIC_OR_DEPARTMENT_OTHER): Payer: Medicare Other

## 2015-07-21 ENCOUNTER — Telehealth: Payer: Self-pay | Admitting: Hematology

## 2015-07-21 ENCOUNTER — Ambulatory Visit (HOSPITAL_BASED_OUTPATIENT_CLINIC_OR_DEPARTMENT_OTHER): Payer: Medicare Other | Admitting: Hematology

## 2015-07-21 ENCOUNTER — Encounter: Payer: Self-pay | Admitting: Hematology

## 2015-07-21 VITALS — BP 137/75 | HR 84 | Temp 98.7°F | Resp 18 | Ht 62.0 in | Wt 166.9 lb

## 2015-07-21 DIAGNOSIS — E039 Hypothyroidism, unspecified: Secondary | ICD-10-CM

## 2015-07-21 DIAGNOSIS — D0511 Intraductal carcinoma in situ of right breast: Secondary | ICD-10-CM

## 2015-07-21 DIAGNOSIS — Z79811 Long term (current) use of aromatase inhibitors: Secondary | ICD-10-CM

## 2015-07-21 DIAGNOSIS — M069 Rheumatoid arthritis, unspecified: Secondary | ICD-10-CM

## 2015-07-21 DIAGNOSIS — N63 Unspecified lump in breast: Secondary | ICD-10-CM

## 2015-07-21 DIAGNOSIS — Z170 Estrogen receptor positive status [ER+]: Secondary | ICD-10-CM

## 2015-07-21 DIAGNOSIS — C50411 Malignant neoplasm of upper-outer quadrant of right female breast: Secondary | ICD-10-CM

## 2015-07-21 LAB — COMPREHENSIVE METABOLIC PANEL
ALBUMIN: 3.5 g/dL (ref 3.5–5.0)
ALK PHOS: 115 U/L (ref 40–150)
ALT: 17 U/L (ref 0–55)
AST: 24 U/L (ref 5–34)
Anion Gap: 9 mEq/L (ref 3–11)
BUN: 10.1 mg/dL (ref 7.0–26.0)
CALCIUM: 9.4 mg/dL (ref 8.4–10.4)
CHLORIDE: 106 meq/L (ref 98–109)
CO2: 26 mEq/L (ref 22–29)
Creatinine: 0.8 mg/dL (ref 0.6–1.1)
Glucose: 108 mg/dl (ref 70–140)
POTASSIUM: 3.6 meq/L (ref 3.5–5.1)
Sodium: 141 mEq/L (ref 136–145)
Total Bilirubin: 0.47 mg/dL (ref 0.20–1.20)
Total Protein: 7.5 g/dL (ref 6.4–8.3)

## 2015-07-21 LAB — CBC WITH DIFFERENTIAL/PLATELET
BASO%: 1 % (ref 0.0–2.0)
BASOS ABS: 0.1 10*3/uL (ref 0.0–0.1)
EOS ABS: 0.1 10*3/uL (ref 0.0–0.5)
EOS%: 1.8 % (ref 0.0–7.0)
HEMATOCRIT: 39.2 % (ref 34.8–46.6)
HEMOGLOBIN: 12.8 g/dL (ref 11.6–15.9)
LYMPH#: 2.5 10*3/uL (ref 0.9–3.3)
LYMPH%: 41.3 % (ref 14.0–49.7)
MCH: 30.6 pg (ref 25.1–34.0)
MCHC: 32.6 g/dL (ref 31.5–36.0)
MCV: 93.9 fL (ref 79.5–101.0)
MONO#: 0.3 10*3/uL (ref 0.1–0.9)
MONO%: 5.3 % (ref 0.0–14.0)
NEUT#: 3.1 10*3/uL (ref 1.5–6.5)
NEUT%: 50.6 % (ref 38.4–76.8)
Platelets: 304 10*3/uL (ref 145–400)
RBC: 4.17 10*6/uL (ref 3.70–5.45)
RDW: 14 % (ref 11.2–14.5)
WBC: 6 10*3/uL (ref 3.9–10.3)

## 2015-07-21 NOTE — Progress Notes (Signed)
Evelyn Orr  [phone] [phone]  Clinic follow Up Note   Patient Care Team: [name], MD as PCP - General (Internal Medicine) Earnstine Regal, PA-C as Physician Assistant (Obstetrics and Gynecology) Rolm Bookbinder, MD as Consulting Physician (General Surgery) Truitt Merle, MD as Consulting Physician (Hematology) Thea Silversmith, MD as Consulting Physician (Radiation Oncology) Mauro Kaufmann, RN as Registered Nurse Rockwell Germany, RN as Registered Nurse Sylvan Cheese, NP as Nurse Practitioner (Hematology and Oncology) 07/21/2015  CHIEF COMPLAINTS:  Follow up right breast DCIS  Oncology History   Breast cancer of upper-outer quadrant of right female breast [facility])   Staging form: Breast, AJCC 7th Edition     Clinical stage from 03/26/2015: Stage 0 (Tis (DCIS), N0, M0) - Unsigned       Breast cancer of upper-outer quadrant of right female breast ([place])   03/13/2015 Mammogram 7 mm oval mass in the right breast is suspicious for malignancy.   03/20/2015 Initial Diagnosis Breast cancer of upper-outer quadrant of right female breast ([place])   03/20/2015 Initial Biopsy Breast, right, needle core biopsy DUCTAL CARCINOMA IN SITU IN PAPILLOMA, LOW GRADE   03/20/2015 Receptors her2 Estrogen Receptor: 95%, POSITIVE, STRONG STAINING INTENSITY Progesterone Receptor: 95%, POSITIVE, STRONG STAINING INTENSITY   04/09/2015 Surgery Right breast lumpectomy   04/09/2015 Pathology Results Right breast lumpectomy showed low-grade DCIS, margins were negative. Fibrocystic changes with adenosis.    HISTORY OF PRESENTING ILLNESS:  Evelyn Orr 70 y.o. female is here because of her newly diagnosed right breast DCIS. She presents to our multidisciplinary breast clinic today with her husband and other family members.  This was discovered by screening mammogram. She did not have any palpable breast or axillary masses. She denies any new symptoms. She feels  well, no complains, she is physically active, she exercises 4 time a week, lives with her husband.  She had bilateral breast reduction in 2003. She has multiple family members who had history of breast cancer.  CURRENT THERAPY: Anastrozole 1 mg once daily, started on 06/20/2015  INTERIM HISTORY [name] returns for follow-up. She is doing well. She started on the anastrozole and her only complaint is hot flashes, which happens several times a day, and night also. It only lasts a few minutes, and she still feels is still manageable. No other complaints. She is very active, dosed to gem 4 times a week for exercise, denies any joint or muscular discomfort, or any other complaints.  MEDICAL HISTORY:  Past Medical History  Diagnosis Date  . Arthritis   . Thyroid disease     hypothyroidism  . Rheumatoid arthritis(714.0)   . PONV (postoperative nausea and vomiting)   . Breast cancer of upper-outer quadrant of right female breast ([place]) 03/20/2015  . Breast cancer [facility])     SURGICAL HISTORY: Past Surgical History  Procedure Laterality Date  . Breast reduction surgery  2001  . Thyroidectomy      age 83  . Wrist arthrodesis  2003    left-rt knee injection  . Shoulder arthroscopy w/ rotator cuff repair  2006    right  . Carpal tunnel release  2006    rt  . Cervical fusion  2009  . Carpal tunnel release      left  . Knee arthroscopy  2004    right  . Colonoscopy      2002,2005,2008 w/[facility]  . Gastrocnemius recession Left 10/11/2013    Procedure: LEFT GASTROC RECESSION; LEFT POSTERIOR TIBIAL TENOLYSIS;  Surgeon: Wylene Simmer, MD;  Location: [place];  Service: Orthopedics;  Laterality: Left;  . Calcaneal osteotomy Left 10/11/2013    Procedure: LEFT CALCANEAL OSTEOTOMY;  Surgeon: Wylene Simmer, MD;  Location: [place];  Service: Orthopedics;  Laterality: Left;  . Removal of implant Left 02/14/2014    Procedure: LEFT FOOT REMOVAL OF DEEP IMPLANT;   Surgeon: Wylene Simmer, MD;  Location: [place];  Service: Orthopedics;  Laterality: Left;  . Polypectomy    . Breast lumpectomy with radioactive seed localization Right 04/09/2015    Procedure: BREAST LUMPECTOMY WITH RADIOACTIVE SEED LOCALIZATION;  Surgeon: Rolm Bookbinder, MD;  Location: [place];  Service: General;  Laterality: Right;   GYN HISTORY  Menarchal: 14 LMP: 47  Contraceptive: 3-4 years  HRT: no  G2P2: 2 children 9 daughter and 49 son, no breast feeding    SOCIAL HISTORY: Social History   Social History  . Marital Status: Married    Spouse Name: N/A  . Number of Children: N/A  . Years of Education: N/A   Occupational History  . Not on file.   Social History Main Topics  . Smoking status: Former Smoker -- 0.25 packs/day for 21 years    Types: Cigarettes    Quit date: 02/13/1984  . Smokeless tobacco: Never Used  . Alcohol Use: No  . Drug Use: No  . Sexual Activity: Yes    Birth Control/ Protection: Post-menopausal   Other Topics Concern  . Not on file   Social History Narrative    FAMILY HISTORY: Family History  Problem Relation Age of Onset  . Heart disease Father   . Heart attack Father   . Hypertension Mother   . Breast cancer Paternal Aunt     dx. 61 or younger  . Colon cancer Paternal Uncle   . Prostate cancer Maternal Uncle   . Rectal cancer Neg Hx   . Stomach cancer Neg Hx   . Breast cancer Cousin     4 maternal cousin had breast cancer   . Other Sister     high breast density  . Prostate cancer Brother 25    unknown Gleason  . Prostate cancer Maternal Grandfather     dx. older age  . Colon polyps Son 44    colonoscopy for unspecified reason; found some colon polyps, has not had one since  . Prostate cancer Maternal Uncle   . Prostate cancer Maternal Uncle   . Colon cancer Other   . Cancer Other     MGM's sister    ALLERGIES:  has No Known Allergies.  MEDICATIONS:  Current Outpatient  Prescriptions  Medication Sig Dispense Refill  . anastrozole (ARIMIDEX) 1 MG tablet Take 1 tablet (1 mg total) by mouth daily. 30 tablet 3  . levothyroxine (SYNTHROID, LEVOTHROID) 88 MCG tablet Take 88 mcg by mouth daily.      . Methotrexate Sodium (METHOTREXATE PO) Take by mouth. Pt takes 6 pills once a week for arthritis.    Marland Kitchen HYDROcodone-acetaminophen (NORCO) 10-325 MG tablet Take 1 tablet by mouth every 6 (six) hours as needed. (Patient not taking: Reported on 07/21/2015) 10 tablet 0   No current facility-administered medications for this visit.    REVIEW OF SYSTEMS:   Constitutional: Denies fevers, chills or abnormal night sweats Eyes: Denies blurriness of vision, double vision or watery eyes Ears, nose, mouth, throat, and face: Denies mucositis or sore throat Respiratory: Denies cough, dyspnea or wheezes Cardiovascular: Denies  palpitation, chest discomfort or lower extremity swelling Gastrointestinal:  Denies nausea, heartburn or change in bowel habits Skin: Denies abnormal skin rashes Lymphatics: Denies new lymphadenopathy or easy bruising Neurological:Denies numbness, tingling or new weaknesses Behavioral/Psych: Mood is stable, no new changes  All other systems were reviewed with the patient and are negative.  PHYSICAL EXAMINATION: ECOG PERFORMANCE STATUS: 0 - Asymptomatic  Filed Vitals:   07/21/15 1004  BP: 137/75  Pulse: 84  Temp: 98.7 F (37.1 C)  Resp: 18   Filed Weights   07/21/15 1004  Weight: 166 lb 14.4 oz (75.705 kg)    GENERAL:alert, no distress and comfortable SKIN: skin color, texture, turgor are normal, no rashes or significant lesions EYES: normal, conjunctiva are pink and non-injected, sclera clear OROPHARYNX:no exudate, no erythema and lips, buccal mucosa, and tongue normal  NECK: supple, thyroid normal size, non-tender, without nodularity LYMPH:  no palpable lymphadenopathy in the cervical, axillary or inguinal LUNGS: clear to auscultation and  percussion with normal breathing effort HEART: regular rate & rhythm and no murmurs and no lower extremity edema ABDOMEN:abdomen soft, non-tender and normal bowel sounds Musculoskeletal:no cyanosis of digits and no clubbing  PSYCH: alert & oriented x 3 with fluent speech NEURO: no focal motor/sensory deficits Breasts: Breast inspection showed them to be symmetrical with no nipple discharge.  The incision in the right breast is healing very well , there is a few small lumps under the surgical scar in the right breast. Palpation of left breasts and axilla revealed no obvious mass that I could appreciate.   LABORATORY DATA:  I have reviewed the data as listed Lab Results  Component Value Date   WBC 6.0 07/21/2015   HGB 12.8 07/21/2015   HCT 39.2 07/21/2015   MCV 93.9 07/21/2015   PLT 304 07/21/2015    Recent Labs  03/26/15 0835 05/22/15 0914 07/21/15 0943  NA 142 140 141  K 3.7 3.8 3.6  CO2 28 25 26   GLUCOSE 84 85 108  BUN 8.9 8.2 10.1  CREATININE 0.8 0.8 0.8  CALCIUM 9.5 9.3 9.4  PROT 7.6 7.9 7.5  ALBUMIN 3.6 3.7 3.5  AST 20 23 24   ALT 12 10 17   ALKPHOS 101 104 115  BILITOT 0.60 0.50 0.47    Pathology report  Diagnosis 04/09/2015 1. Breast, lumpectomy, right - COMPLEX SCLEROSING LESION SHOWING FIBROCYSTIC CHANGES WITH ADENOSIS, USUAL DUCTAL HYPERPLASIA AND LOW GRADE DUCTAL CARCINOMA IN SITU. - MARGINS ARE NEGATIVE. - SEE ONCOLOGY TEMPLATE. 2. Breast, excision, right medial margin - COMPLEX SCLEROSING LESION WITH FIBROCYSTIC CHANGES. - NO ATYPIA, HYPERPLASIA, OR MALIGNANCY IDENTIFIED. Marland Kitchen 3. Breast, excision, right superior margin - COMPLEX SCLEROSING LESION SHOWING FIBROCYSTIC CHANGES WITH ADENOSIS AND USUAL DUCTAL HYPERPLASIA. - NO ATYPIA OR MALIGNANCY IDENTIFIED. Microscopic Comment 1. BREAST, IN SITU CARCINOMA Specimen, including laterality: Right partial breast with additional right medial and right superior margins. Procedure (include lymph node sampling  sentinel-non-sentinel): Right breast lumpectomy with additional right medial and right superior margin excisions. Grade of carcinoma: Low grade. Necrosis: No. Estimated tumor size: (glass slide measurement): Largest residual focus of ductal carcinoma in situ measures 0.3 cm. Treatment effect: Not applicable. Distance to closest margin: Ductal carcinoma in situ is at least 0.4 cm from all margins (closest to medial margin). Breast prognostic profile: Performed on previous case, [phone]. Estrogen receptor: 95%, positive. Progesterone receptor: 95%, positive. Lymph nodes: No lymph nodes received. TNM: pTis, pNX.   RADIOGRAPHIC STUDIES: I have personally reviewed the radiological images as listed and agreed with  the findings in the report. No results found.  ASSESSMENT & PLAN:  70 year old postmenopausal woman, presented with screening discovered DCIS.  1. Right breast DCIS, low grade, ER and PR strongly positive - I discussed her surgical pathology result with her in details.  It showed low-grade DCIS, no evidence of invasive carcinoma. Her surgical margins were negative. -Her DCIS is cured by complete surgical resection. Any form of adjuvant therapy is preventive. -adjuvant irradiation was not recommended. -she is currently on anastrozole to prevent future breast cancer. Tolerating well, we'll continue,  plan for a total of 5 years -We discussed breast cancer surveillance after she completes treatment, Including annual mammogram, monthly self breast exam, and breast exam by a physician every 6-12 months.  2. Right breast lump -This is likely a seroma after surgery,smaller now  -She is asymptomatic, or monitored clinically. -She is due for mammogram in October 2017  2. Hyperthyroidism and rheumatoid arthritis -She will continue follow-up with her primary care physician. -She is on weekly methotrexate, and her rheumatoid arthritis has been well-controlled.  Plan -continue  Anastrozole  -she will see survivorship clinic in 2 months, and I'll see her back in 4 months    All questions were answered. The patient knows to call the clinic with any problems, questions or concerns. I spent 15 minutes counseling the patient face to face. The total time spent in the appointment was 20 minutes and more than 50% was on counseling.     Truitt Merle, MD 07/21/2015 10:36 AM

## 2015-07-21 NOTE — Telephone Encounter (Signed)
Pt confirmed labs/ov per 02/20 POF, gave pt AVS and Calendar... KJ °

## 2015-07-29 DIAGNOSIS — H31012 Macula scars of posterior pole (postinflammatory) (post-traumatic), left eye: Secondary | ICD-10-CM | POA: Diagnosis not present

## 2015-07-29 DIAGNOSIS — H35373 Puckering of macula, bilateral: Secondary | ICD-10-CM | POA: Diagnosis not present

## 2015-07-29 DIAGNOSIS — H35031 Hypertensive retinopathy, right eye: Secondary | ICD-10-CM | POA: Diagnosis not present

## 2015-07-29 DIAGNOSIS — H524 Presbyopia: Secondary | ICD-10-CM | POA: Diagnosis not present

## 2015-07-29 DIAGNOSIS — H40023 Open angle with borderline findings, high risk, bilateral: Secondary | ICD-10-CM | POA: Diagnosis not present

## 2015-07-29 DIAGNOSIS — H35033 Hypertensive retinopathy, bilateral: Secondary | ICD-10-CM | POA: Diagnosis not present

## 2015-07-29 DIAGNOSIS — H35032 Hypertensive retinopathy, left eye: Secondary | ICD-10-CM | POA: Diagnosis not present

## 2015-08-12 ENCOUNTER — Other Ambulatory Visit: Payer: Self-pay | Admitting: *Deleted

## 2015-08-12 DIAGNOSIS — C50411 Malignant neoplasm of upper-outer quadrant of right female breast: Secondary | ICD-10-CM

## 2015-08-12 MED ORDER — ANASTROZOLE 1 MG PO TABS: 1.0000 mg | ORAL_TABLET | Freq: Every day | ORAL | Status: DC

## 2015-08-14 ENCOUNTER — Telehealth: Payer: Self-pay | Admitting: Hematology

## 2015-08-14 NOTE — Telephone Encounter (Signed)
cld pt and spoke to spouse gave spouse r/s appt from 3/28 to 3/23@9 -adv to call back if appt not good for her

## 2015-08-21 ENCOUNTER — Ambulatory Visit (HOSPITAL_BASED_OUTPATIENT_CLINIC_OR_DEPARTMENT_OTHER): Payer: Medicare Other | Admitting: Nurse Practitioner

## 2015-08-21 ENCOUNTER — Encounter: Payer: Self-pay | Admitting: Nurse Practitioner

## 2015-08-21 VITALS — BP 119/65 | HR 75 | Temp 97.8°F | Resp 18 | Ht 62.0 in | Wt 168.9 lb

## 2015-08-21 DIAGNOSIS — D0511 Intraductal carcinoma in situ of right breast: Secondary | ICD-10-CM

## 2015-08-21 DIAGNOSIS — C50411 Malignant neoplasm of upper-outer quadrant of right female breast: Secondary | ICD-10-CM

## 2015-08-21 DIAGNOSIS — Z79811 Long term (current) use of aromatase inhibitors: Secondary | ICD-10-CM | POA: Diagnosis not present

## 2015-08-21 DIAGNOSIS — Z170 Estrogen receptor positive status [ER+]: Secondary | ICD-10-CM

## 2015-08-21 NOTE — Progress Notes (Signed)
CLINIC:  Cancer Survivorship   REASON FOR VISIT:  Routine follow-up post-treatment for a recent history of breast cancer.  BRIEF ONCOLOGIC HISTORY:  Oncology History   Breast cancer of upper-outer quadrant of right female breast [facility])   Staging form: Breast, AJCC 7th Edition     Clinical stage from 03/26/2015: Stage 0 (Tis (DCIS), N0, M0) - Unsigned       Breast cancer of upper-outer quadrant of right female breast ([place])   03/13/2015 Mammogram 7 mm oval mass in the right breast is suspicious for malignancy.   03/20/2015 Initial Biopsy Breast, right, needle core biopsy DUCTAL CARCINOMA IN SITU IN PAPILLOMA, LOW GRADE   03/20/2015 Receptors her2 Estrogen Receptor: 95%, POSITIVE, STRONG STAINING INTENSITY Progesterone Receptor: 95%, POSITIVE, STRONG STAINING INTENSITY   03/20/2015 Clinical Stage Stage 0: Tis N0   04/09/2015 Surgery Right breast lumpectomy   04/09/2015 Pathology Results Right breast lumpectomy showed low-grade DCIS, margins were negative. Fibrocystic changes with adenosis.   04/09/2015 Pathologic Stage Stage 0: Tis N0   04/11/2015 Procedure Breast/Ovarian panel (GeneDX) revealed variant of uncertain significance called "c.704C>A (p.Thr235Lys; otherwise negative at ATM, BARD1, BRCA1, BRCA2, BRIP1, CDH1, CHEK2, FANCC, MLH1, MSH2, MSH6, NBN, PALB2, PMS2, PTEN, RAD51C, RAD51D, TP53, and XRCC2.    06/20/2015 -  Anti-estrogen oral therapy anastrozole 1 mg once daily    INTERVAL HISTORY:  Evelyn Orr presents to the [facility] today for our initial meeting to review her survivorship care plan detailing her treatment course for breast cancer, as well as monitoring long-term side effects of that treatment, education regarding health maintenance, screening, and overall wellness and health promotion.     Overall, Evelyn Orr reports doing very well since her surgery four months ago.  She denies any fatigue, headache, shortness of breath or bone pain.  She has a daily  cough which is productive of small amounts of clear sputum on occasion.  No hemoptysis.  She has a good appetite and denies weight loss.  She is tolerating her anastrozole well with some hot flashes, which she states are bearable.  She denies vaginal changes or joint pain.  She denies any mass or lesion in her breast and has no breast pain.  She does have some difficulty sleeping through the night.  REVIEW OF SYSTEMS:  General: Hot flashes as above.  Denies fever, chills, unintentional weight loss, or generalized fatigue.  HEENT: Wears glasses. Denies visual changes, hearing loss, mouth sores or difficulty swallowing. Cardiac: Denies palpitations, chest pain, and lower extremity edema.  Respiratory: Denies wheeze or dyspnea on exertion.  Breast: Denies any new nodularity, masses, tenderness, nipple changes, or nipple discharge.  GI: Denies abdominal pain, constipation, diarrhea, nausea, or vomiting.  GU: Denies dysuria, hematuria, vaginal bleeding, vaginal discharge, or vaginal dryness.  Musculoskeletal: Denies joint or bone pain.  Neuro: Denies recent fall or numbness / tingling in her extremities. Skin: Denies rash, pruritis, or open wounds.  Psych: Denies depression, anxiety, insomnia, or memory loss.   A 14-point review of systems was completed and was negative, except as noted above.   ONCOLOGY TREATMENT TEAM:  1. Surgeon:  Dr. Donne Hazel at [facility]  2. Medical Oncologist: Dr. Burr Medico 3. Radiation Oncologist: Dr. Pablo Ledger    PAST MEDICAL/SURGICAL HISTORY:  Past Medical History  Diagnosis Date  . Arthritis   . Thyroid disease     hypothyroidism  . Rheumatoid arthritis(714.0)   . PONV (postoperative nausea and vomiting)   . Breast cancer of upper-outer quadrant of right  female breast ([place]) 03/20/2015  . Breast cancer [facility])    Past Surgical History  Procedure Laterality Date  . Breast reduction surgery  2001  . Thyroidectomy      age 31  . Wrist arthrodesis   2003    left-rt knee injection  . Shoulder arthroscopy w/ rotator cuff repair  2006    right  . Carpal tunnel release  2006    rt  . Cervical fusion  2009  . Carpal tunnel release      left  . Knee arthroscopy  2004    right  . Colonoscopy      2002,2005,2008 w/[facility]  . Gastrocnemius recession Left 10/11/2013    Procedure: LEFT GASTROC RECESSION; LEFT POSTERIOR TIBIAL TENOLYSIS;  Surgeon: Wylene Simmer, MD;  Location: [place];  Service: Orthopedics;  Laterality: Left;  . Calcaneal osteotomy Left 10/11/2013    Procedure: LEFT CALCANEAL OSTEOTOMY;  Surgeon: Wylene Simmer, MD;  Location: [place];  Service: Orthopedics;  Laterality: Left;  . Removal of implant Left 02/14/2014    Procedure: LEFT FOOT REMOVAL OF DEEP IMPLANT;  Surgeon: Wylene Simmer, MD;  Location: [place];  Service: Orthopedics;  Laterality: Left;  . Polypectomy    . Breast lumpectomy with radioactive seed localization Right 04/09/2015    Procedure: BREAST LUMPECTOMY WITH RADIOACTIVE SEED LOCALIZATION;  Surgeon: Rolm Bookbinder, MD;  Location: [place];  Service: General;  Laterality: Right;     ALLERGIES:  No Known Allergies   CURRENT MEDICATIONS:  Current Outpatient Prescriptions on File Prior to Visit  Medication Sig Dispense Refill  . anastrozole (ARIMIDEX) 1 MG tablet Take 1 tablet (1 mg total) by mouth daily. 90 tablet 3  . levothyroxine (SYNTHROID, LEVOTHROID) 88 MCG tablet Take 88 mcg by mouth daily.      . Methotrexate Sodium (METHOTREXATE PO) Take by mouth. Pt takes 6 pills once a week for arthritis.    Marland Kitchen HYDROcodone-acetaminophen (NORCO) 10-325 MG tablet Take 1 tablet by mouth every 6 (six) hours as needed. (Patient not taking: Reported on 07/21/2015) 10 tablet 0   No current facility-administered medications on file prior to visit.     ONCOLOGIC FAMILY HISTORY:  Family History  Problem Relation Age of Onset  . Heart disease Father   .  Heart attack Father   . Hypertension Mother   . Breast cancer Paternal Aunt     dx. 52 or younger  . Colon cancer Paternal Uncle   . Prostate cancer Maternal Uncle   . Rectal cancer Neg Hx   . Stomach cancer Neg Hx   . Breast cancer Cousin     4 maternal cousin had breast cancer   . Other Sister     high breast density  . Prostate cancer Brother 10    unknown Gleason  . Prostate cancer Maternal Grandfather     dx. older age  . Colon polyps Son 55    colonoscopy for unspecified reason; found some colon polyps, has not had one since  . Prostate cancer Maternal Uncle   . Prostate cancer Maternal Uncle   . Colon cancer Other   . Cancer Other     MGM's sister     GENETIC COUNSELING/TESTING: Yes, performed 04/11/2015: Breast/Ovarian panel (GeneDX) revealed variant of uncertain significance called "c.704C>A (p.Thr235Lys; otherwise negative at ATM, BARD1, BRCA1, BRCA2, BRIP1, CDH1, CHEK2, FANCC, MLH1, MSH2, MSH6, NBN, PALB2, PMS2, PTEN, RAD51C, RAD51D, TP53, and XRCC2.    SOCIAL HISTORY:  JASIEL APACHITO is married and lives with her spouse in [place], [place].  She has 2 children. Evelyn Orr is currently not working.  She is a former smoker and denies any current or history of alcohol or illicit drug use.     PHYSICAL EXAMINATION:  Vital Signs: Filed Vitals:   08/21/15 0827  BP: 119/65  Pulse: 75  Temp: 97.8 F (36.6 C)  Resp: 18   ECOG Performance Status: 0  General: Well-nourished, well-appearing female in no acute distress.  She is unaccompanied in clinic today.   HEENT: Head is atraumatic and normocephalic.  Pupils equal and reactive to light and accomodation. Conjunctivae clear without exudate.  Sclerae anicteric. Oral mucosa is pink, moist, and intact without lesions.  Oropharynx is pink without lesions or erythema.  Lymph: No cervical, supraclavicular, infraclavicular, or axillary lymphadenopathy noted on palpation.  Cardiovascular: Regular rate and  rhythm without murmurs, rubs, or gallops. Respiratory: Clear to auscultation bilaterally. Chest expansion symmetric without accessory muscle use on inspiration or expiration.  GI: Abdomen soft and round. No tenderness to palpation. Bowel sounds normoactive in 4 quadrants. GU: Deferred.  Neuro: No focal deficits. Steady gait.  Psych: Mood and affect normal and appropriate for situation.  Extremities: No edema, cyanosis, or clubbing.  Skin: Warm and dry. No open lesions noted.   LABORATORY DATA:  None for this visit.  DIAGNOSTIC IMAGING:  None for this visit.     ASSESSMENT AND PLAN:   1. Brreast cancer: Stage 0 ductal carcinoma in situ of the right breast, ER positive, PR positive, S/P lumpectomy now on adjuvant endocrine therapy with anastrozole begun 06/2015.  Evelyn Orr is doing very well without clinical symptoms worrisome for disease recurrence. She will follow-up with her medical oncologist,  Dr. Burr Medico, in June 2017 with history and physical examination per surveillance protocol. She will follow up with Dr. Donne Hazel as scheduled. She will continue her anti-estrogen therapy with anastrozole at this time and reports that her side effects are bearable. She was instructed to make [place] aware if she begins to experience any new or increased side effects of the medication and I could see her back in clinic to help manage those side effects, as needed. A comprehensive survivorship care plan and treatment summary was reviewed with the patient today detailing her breast cancer diagnosis, treatment course, potential late/long-term effects of treatment, appropriate follow-up care with recommendations for the future, and patient education resources.  A copy of this summary, along with a letter will be sent to the patient's primary care provider via in basket message after today's visit.  Evelyn Orr is welcome to return to the Survivorship Clinic in the future, as needed; no follow-up will be scheduled  at this time.    2. Bone health:  Given Evelyn Orr's age/history of breast cancer and her current treatment regimen including endocrine therapy with anastrozole, she is at risk for bone demineralization.  Per our records, her last DEXA scan was performed 05/2015 revealing osteopenia. We will continue to monitor this closely while she is on endocrine therapy.  In the meantime, she was encouraged to increase her consumption of foods rich in calcium and vitamin D as well as to increase her weight-bearing activities.  She was given education on specific activities to promote bone health.  3. Cancer screening:  Due to Evelyn Orr's history and her age, she should receive screening for skin cancers, colon cancer, and gynecologic cancers.  The information and recommendations are listed on the  patient's comprehensive care plan/treatment summary and were reviewed in detail with the patient.    4. Health maintenance and wellness promotion: Evelyn Orr was encouraged to consume 5-7 servings of fruits and vegetables per day. We reviewed the "Nutrition Rainbow" handout, as well as discussed recommendations to maximize nutrition and minimize recurrence, such as increased intake of fruits, vegetables, lean proteins, and minimizing the intake of red meats and processed foods.  She was also encouraged to continue to engage in moderate to vigorous exercise for 30 minutes per day most days of the week. We discussed the [employer] fitness program, which is designed for cancer survivors to help them become more physically fit after cancer treatments.  She was instructed to limit her alcohol consumption and continue to abstain from tobacco use.  A copy of the "Take Control of Your Health" brochure was given to her reinforcing these recommendations.   5. Support services/counseling: It is not uncommon for this period of the patient's cancer care trajectory to be one of many emotions and stressors.  We discussed an  opportunity for her to participate in the next session of [facility] ("Finding Your New Normal") support group series designed for patients after they have completed treatment.  Evelyn Orr was encouraged to take advantage of our many other support services programs, support groups, and/or counseling in coping with her new life as a cancer survivor after completing anti-cancer treatment.  She was offered support today through active listening and expressive supportive counseling.  She was given information regarding our available services and encouraged to contact me with any questions or for help enrolling in any of our support group/programs.    A total of 60 minutes of face-to-face time was spent with this patient with greater than 50% of that time in counseling and care-coordination.   Sylvan Cheese, NP  Survivorship Program [facility] [phone]   Note: PRIMARY CARE PROVIDER Maximino Greenland, MD [phone]

## 2015-08-26 ENCOUNTER — Encounter: Payer: Self-pay | Admitting: Adult Health

## 2015-08-29 ENCOUNTER — Encounter: Payer: Self-pay | Admitting: Nurse Practitioner

## 2015-09-09 DIAGNOSIS — E039 Hypothyroidism, unspecified: Secondary | ICD-10-CM | POA: Diagnosis not present

## 2015-09-09 DIAGNOSIS — R0982 Postnasal drip: Secondary | ICD-10-CM | POA: Diagnosis not present

## 2015-09-09 DIAGNOSIS — M069 Rheumatoid arthritis, unspecified: Secondary | ICD-10-CM | POA: Diagnosis not present

## 2015-09-09 DIAGNOSIS — J309 Allergic rhinitis, unspecified: Secondary | ICD-10-CM | POA: Diagnosis not present

## 2015-10-29 DIAGNOSIS — E039 Hypothyroidism, unspecified: Secondary | ICD-10-CM | POA: Diagnosis not present

## 2015-11-10 ENCOUNTER — Encounter: Payer: Self-pay | Admitting: Hematology

## 2015-11-10 ENCOUNTER — Telehealth: Payer: Self-pay | Admitting: Hematology

## 2015-11-10 ENCOUNTER — Other Ambulatory Visit (HOSPITAL_BASED_OUTPATIENT_CLINIC_OR_DEPARTMENT_OTHER): Payer: Medicare Other

## 2015-11-10 ENCOUNTER — Ambulatory Visit (HOSPITAL_BASED_OUTPATIENT_CLINIC_OR_DEPARTMENT_OTHER): Payer: Medicare Other | Admitting: Hematology

## 2015-11-10 VITALS — BP 137/82 | HR 77 | Temp 98.2°F | Resp 18 | Ht 62.0 in | Wt 165.5 lb

## 2015-11-10 DIAGNOSIS — D0511 Intraductal carcinoma in situ of right breast: Secondary | ICD-10-CM

## 2015-11-10 DIAGNOSIS — C50411 Malignant neoplasm of upper-outer quadrant of right female breast: Secondary | ICD-10-CM

## 2015-11-10 DIAGNOSIS — Z79811 Long term (current) use of aromatase inhibitors: Secondary | ICD-10-CM | POA: Diagnosis not present

## 2015-11-10 DIAGNOSIS — M8580 Other specified disorders of bone density and structure, unspecified site: Secondary | ICD-10-CM | POA: Diagnosis not present

## 2015-11-10 LAB — COMPREHENSIVE METABOLIC PANEL
ALT: 19 U/L (ref 0–55)
AST: 28 U/L (ref 5–34)
Albumin: 3.5 g/dL (ref 3.5–5.0)
Alkaline Phosphatase: 99 U/L (ref 40–150)
Anion Gap: 9 mEq/L (ref 3–11)
BUN: 8.5 mg/dL (ref 7.0–26.0)
CHLORIDE: 106 meq/L (ref 98–109)
CO2: 27 meq/L (ref 22–29)
Calcium: 9.3 mg/dL (ref 8.4–10.4)
Creatinine: 0.8 mg/dL (ref 0.6–1.1)
EGFR: >90 mL/min/{1.73_m2} (ref >90)
GLUCOSE: 96 mg/dL (ref 70–140)
POTASSIUM: 3.5 meq/L (ref 3.5–5.1)
SODIUM: 141 meq/L (ref 136–145)
Total Bilirubin: 0.52 mg/dL (ref 0.20–1.20)
Total Protein: 7.8 g/dL (ref 6.4–8.3)

## 2015-11-10 LAB — CBC WITH DIFFERENTIAL/PLATELET
BASO%: 1.1 % (ref 0.0–2.0)
BASOS ABS: 0.1 10*3/uL (ref 0.0–0.1)
EOS%: 2.9 % (ref 0.0–7.0)
Eosinophils Absolute: 0.2 10*3/uL (ref 0.0–0.5)
HCT: 38.6 % (ref 34.8–46.6)
HGB: 12.6 g/dL (ref 11.6–15.9)
LYMPH%: 47.9 % (ref 14.0–49.7)
MCH: 30.2 pg (ref 25.1–34.0)
MCHC: 32.8 g/dL (ref 31.5–36.0)
MCV: 92 fL (ref 79.5–101.0)
MONO#: 0.3 10*3/uL (ref 0.1–0.9)
MONO%: 4.7 % (ref 0.0–14.0)
NEUT#: 2.4 10*3/uL (ref 1.5–6.5)
NEUT%: 43.4 % (ref 38.4–76.8)
Platelets: 291 10*3/uL (ref 145–400)
RBC: 4.19 10*6/uL (ref 3.70–5.45)
RDW: 13 % (ref 11.2–14.5)
WBC: 5.6 10*3/uL (ref 3.9–10.3)
lymph#: 2.7 10*3/uL (ref 0.9–3.3)

## 2015-11-10 NOTE — Progress Notes (Signed)
[place]  [phone] [phone]  Clinic follow Up Note   Patient Care Team: [name], MD as PCP - General (Internal Medicine) Earnstine Regal, PA-C as Physician Assistant (Obstetrics and Gynecology) Rolm Bookbinder, MD as Consulting Physician (General Surgery) Truitt Merle, MD as Consulting Physician (Hematology) Thea Silversmith, MD as Consulting Physician (Radiation Oncology) Mauro Kaufmann, RN as Registered Nurse Rockwell Germany, RN as Registered Nurse Sylvan Cheese, NP as Nurse Practitioner (Hematology and Oncology) 11/10/2015  CHIEF COMPLAINTS:  Follow up right breast DCIS  Oncology History   Breast cancer of upper-outer quadrant of right female breast [facility])   Staging form: Breast, AJCC 7th Edition     Clinical stage from 03/26/2015: Stage 0 (Tis (DCIS), N0, M0) - Unsigned       Breast cancer of upper-outer quadrant of right female breast ([place])   03/13/2015 Mammogram 7 mm oval mass in the right breast is suspicious for malignancy.   03/20/2015 Initial Biopsy Breast, right, needle core biopsy DUCTAL CARCINOMA IN SITU IN PAPILLOMA, LOW GRADE   03/20/2015 Receptors her2 Estrogen Receptor: 95%, POSITIVE, STRONG STAINING INTENSITY Progesterone Receptor: 95%, POSITIVE, STRONG STAINING INTENSITY   03/20/2015 Clinical Stage Stage 0: Tis N0   04/09/2015 Surgery Right breast lumpectomy   04/09/2015 Pathology Results Right breast lumpectomy showed low-grade DCIS, margins were negative. Fibrocystic changes with adenosis.   04/09/2015 Pathologic Stage Stage 0: Tis N0   04/11/2015 Procedure Breast/Ovarian panel (GeneDX) revealed variant of uncertain significance called "c.704C>A (p.Thr235Lys; otherwise negative at ATM, BARD1, BRCA1, BRCA2, BRIP1, CDH1, CHEK2, FANCC, MLH1, MSH2, MSH6, NBN, PALB2, PMS2, PTEN, RAD51C, RAD51D, TP53, and XRCC2.    06/20/2015 -  Anti-estrogen oral therapy anastrozole 1 mg once daily   08/21/2015 Survivorship  Survivorship care plan completed and copy given to patient    HISTORY OF PRESENTING ILLNESS:  Evelyn Orr 70 y.o. female is here because of her newly diagnosed right breast DCIS. She presents to our multidisciplinary breast clinic today with her husband and other family members.  This was discovered by screening mammogram. She did not have any palpable breast or axillary masses. She denies any new symptoms. She feels well, no complains, she is physically active, she exercises 4 time a week, lives with her husband.  She had bilateral breast reduction in 2003. She has multiple family members who had history of breast cancer.  CURRENT THERAPY: Anastrozole 1 mg once daily, started on 06/20/2015  INTERIM HISTORY Evelyn Orr returns for follow-up. She is doing well overall. She has been on anastrozole for 5 months, tolerating very well. Her hot flash has subsided, it is mild and manageable now. She was seen at the survivorship clinic last month, she remains to be physically active, participate exercise program 3 times a week at the [facility]. She denies any significant muscle joint discomfort, no other complaints.  MEDICAL HISTORY:  Past Medical History  Diagnosis Date  . Arthritis   . Thyroid disease     hypothyroidism  . Rheumatoid arthritis(714.0)   . PONV (postoperative nausea and vomiting)   . Breast cancer of upper-outer quadrant of right female breast ([place]) 03/20/2015  . Breast cancer [facility])     SURGICAL HISTORY: Past Surgical History  Procedure Laterality Date  . Breast reduction surgery  2001  . Thyroidectomy      age 38  . Wrist arthrodesis  2003    left-rt knee injection  . Shoulder arthroscopy w/ rotator cuff repair  2006  right  . Carpal tunnel release  2006    rt  . Cervical fusion  2009  . Carpal tunnel release      left  . Knee arthroscopy  2004    right  . Colonoscopy      2002,2005,2008 w/Brodie  . Gastrocnemius recession Left 10/11/2013    Procedure:  LEFT GASTROC RECESSION; LEFT POSTERIOR TIBIAL TENOLYSIS;  Surgeon: Wylene Simmer, MD;  Location: [place];  Service: Orthopedics;  Laterality: Left;  . Calcaneal osteotomy Left 10/11/2013    Procedure: LEFT CALCANEAL OSTEOTOMY;  Surgeon: Wylene Simmer, MD;  Location: [place];  Service: Orthopedics;  Laterality: Left;  . Removal of implant Left 02/14/2014    Procedure: LEFT FOOT REMOVAL OF DEEP IMPLANT;  Surgeon: Wylene Simmer, MD;  Location: [place];  Service: Orthopedics;  Laterality: Left;  . Polypectomy    . Breast lumpectomy with radioactive seed localization Right 04/09/2015    Procedure: BREAST LUMPECTOMY WITH RADIOACTIVE SEED LOCALIZATION;  Surgeon: Rolm Bookbinder, MD;  Location: [place];  Service: General;  Laterality: Right;   GYN HISTORY  Menarchal: 14 LMP: 47  Contraceptive: 3-4 years  HRT: no  G2P2: 2 children 22 daughter and 73 son, no breast feeding    SOCIAL HISTORY: Social History   Social History  . Marital Status: Married    Spouse Name: N/A  . Number of Children: N/A  . Years of Education: N/A   Occupational History  . Not on file.   Social History Main Topics  . Smoking status: Former Smoker -- 0.25 packs/day for 21 years    Types: Cigarettes    Quit date: 02/13/1984  . Smokeless tobacco: Never Used  . Alcohol Use: No  . Drug Use: No  . Sexual Activity: Yes    Birth Control/ Protection: Post-menopausal   Other Topics Concern  . Not on file   Social History Narrative    FAMILY HISTORY: Family History  Problem Relation Age of Onset  . Heart disease Father   . Heart attack Father   . Hypertension Mother   . Breast cancer Paternal Aunt     dx. 38 or younger  . Colon cancer Paternal Uncle   . Prostate cancer Maternal Uncle   . Rectal cancer Neg Hx   . Stomach cancer Neg Hx   . Breast cancer Cousin     4 maternal cousin had breast cancer   . Other Sister     high breast density    . Prostate cancer Brother 28    unknown Gleason  . Prostate cancer Maternal Grandfather     dx. older age  . Colon polyps Son 31    colonoscopy for unspecified reason; found some colon polyps, has not had one since  . Prostate cancer Maternal Uncle   . Prostate cancer Maternal Uncle   . Colon cancer Other   . Cancer Other     MGM's sister    ALLERGIES:  has No Known Allergies.  MEDICATIONS:  Current Outpatient Prescriptions  Medication Sig Dispense Refill  . anastrozole (ARIMIDEX) 1 MG tablet Take 1 tablet (1 mg total) by mouth daily. 90 tablet 3  . HYDROcodone-acetaminophen (NORCO) 10-325 MG tablet Take 1 tablet by mouth every 6 (six) hours as needed. (Patient not taking: Reported on 07/21/2015) 10 tablet 0  . levothyroxine (SYNTHROID, LEVOTHROID) 88 MCG tablet Take 88 mcg by mouth daily.      . Methotrexate Sodium (METHOTREXATE PO)  Take by mouth. Pt takes 6 pills once a week for arthritis.     No current facility-administered medications for this visit.    REVIEW OF SYSTEMS:   Constitutional: Denies fevers, chills or abnormal night sweats Eyes: Denies blurriness of vision, double vision or watery eyes Ears, nose, mouth, throat, and face: Denies mucositis or sore throat Respiratory: Denies cough, dyspnea or wheezes Cardiovascular: Denies palpitation, chest discomfort or lower extremity swelling Gastrointestinal:  Denies nausea, heartburn or change in bowel habits Skin: Denies abnormal skin rashes Lymphatics: Denies new lymphadenopathy or easy bruising Neurological:Denies numbness, tingling or new weaknesses Behavioral/Psych: Mood is stable, no new changes  All other systems were reviewed with the patient and are negative.  PHYSICAL EXAMINATION: ECOG PERFORMANCE STATUS: 0 - Asymptomatic  Filed Vitals:   11/10/15 0917  BP: 137/82  Pulse: 77  Temp: 98.2 F (36.8 C)  Resp: 18   Filed Weights   11/10/15 0917  Weight: 165 lb 8 oz (75.07 kg)    GENERAL:alert, no  distress and comfortable SKIN: skin color, texture, turgor are normal, no rashes or significant lesions EYES: normal, conjunctiva are pink and non-injected, sclera clear OROPHARYNX:no exudate, no erythema and lips, buccal mucosa, and tongue normal  NECK: supple, thyroid normal size, non-tender, without nodularity LYMPH:  no palpable lymphadenopathy in the cervical, axillary or inguinal LUNGS: clear to auscultation and percussion with normal breathing effort HEART: regular rate & rhythm and no murmurs and no lower extremity edema ABDOMEN:abdomen soft, non-tender and normal bowel sounds Musculoskeletal:no cyanosis of digits and no clubbing  PSYCH: alert & oriented x 3 with fluent speech NEURO: no focal motor/sensory deficits Breasts: Breast inspection showed them to be symmetrical with no nipple discharge.  The incision in the right breast is healing very well. Palpation of both breasts and axilla revealed no obvious mass that I could appreciate.   LABORATORY DATA:  I have reviewed the data as listed Lab Results  Component Value Date   WBC 5.6 11/10/2015   HGB 12.6 11/10/2015   HCT 38.6 11/10/2015   MCV 92.0 11/10/2015   PLT 291 11/10/2015    Recent Labs  03/26/15 0835 05/22/15 0914 07/21/15 0943  NA 142 140 141  K 3.7 3.8 3.6  CO2 28 25 26   GLUCOSE 84 85 108  BUN 8.9 8.2 10.1  CREATININE 0.8 0.8 0.8  CALCIUM 9.5 9.3 9.4  PROT 7.6 7.9 7.5  ALBUMIN 3.6 3.7 3.5  AST 20 23 24   ALT 12 10 17   ALKPHOS 101 104 115  BILITOT 0.60 0.50 0.47    Pathology report  Diagnosis 04/09/2015 1. Breast, lumpectomy, right - COMPLEX SCLEROSING LESION SHOWING FIBROCYSTIC CHANGES WITH ADENOSIS, USUAL DUCTAL HYPERPLASIA AND LOW GRADE DUCTAL CARCINOMA IN SITU. - MARGINS ARE NEGATIVE. - SEE ONCOLOGY TEMPLATE. 2. Breast, excision, right medial margin - COMPLEX SCLEROSING LESION WITH FIBROCYSTIC CHANGES. - NO ATYPIA, HYPERPLASIA, OR MALIGNANCY IDENTIFIED. Marland Kitchen 3. Breast, excision, right superior  margin - COMPLEX SCLEROSING LESION SHOWING FIBROCYSTIC CHANGES WITH ADENOSIS AND USUAL DUCTAL HYPERPLASIA. - NO ATYPIA OR MALIGNANCY IDENTIFIED. Microscopic Comment 1. BREAST, IN SITU CARCINOMA Specimen, including laterality: Right partial breast with additional right medial and right superior margins. Procedure (include lymph node sampling sentinel-non-sentinel): Right breast lumpectomy with additional right medial and right superior margin excisions. Grade of carcinoma: Low grade. Necrosis: No. Estimated tumor size: (glass slide measurement): Largest residual focus of ductal carcinoma in situ measures 0.3 cm. Treatment effect: Not applicable. Distance to closest margin: Ductal carcinoma in situ  is at least 0.4 cm from all margins (closest to medial margin). Breast prognostic profile: Performed on previous case, [phone]. Estrogen receptor: 95%, positive. Progesterone receptor: 95%, positive. Lymph nodes: No lymph nodes received. TNM: pTis, pNX.   RADIOGRAPHIC STUDIES: I have personally reviewed the radiological images as listed and agreed with the findings in the report.  DEXA 05/29/2015: OSTEOPENIA 10 year risk of fracture: major osteoprotic 5.9%, hip 0.4%  ASSESSMENT & PLAN:  70 year old postmenopausal woman, presented with screening discovered DCIS.  1. Right breast DCIS, low grade, ER and PR strongly positive - I discussed her surgical pathology result with her in details.  It showed low-grade DCIS, no evidence of invasive carcinoma. Her surgical margins were negative. -Her DCIS is cured by complete surgical resection. Any form of adjuvant therapy is preventive. -adjuvant irradiation was not recommended. -she is currently on anastrozole to prevent future breast cancer. Tolerating well, we'll continue,  plan for a total of 5 years -We discussed breast cancer surveillance after she completes treatment, Including annual mammogram, monthly self breast exam, and breast exam  by a physician every 6-12 months.  2. Osteopenia -Her bone density scan in December 2016 showed osteopenia -I encouraged her to take calcium and vitamin D. Her risk of fracture is not high, no need for biphosphonate.  3. Hyperthyroidism and rheumatoid arthritis -She will continue follow-up with her primary care physician. -She is on weekly methotrexate, and her rheumatoid arthritis has been well-controlled.  Plan -continue Anastrozole  -she will see survivorship clinic in 6 months   All questions were answered. The patient knows to call the clinic with any problems, questions or concerns. I spent 20 minutes counseling the patient face to face. The total time spent in the appointment was 25 minutes and more than 50% was on counseling.     Truitt Merle, MD 11/10/2015 9:36 AM

## 2015-11-10 NOTE — Telephone Encounter (Signed)
Gave pt cal & avs.. Pt schedules her mammo per pt

## 2015-11-20 DIAGNOSIS — M0589 Other rheumatoid arthritis with rheumatoid factor of multiple sites: Secondary | ICD-10-CM | POA: Diagnosis not present

## 2015-11-20 DIAGNOSIS — Z79899 Other long term (current) drug therapy: Secondary | ICD-10-CM | POA: Diagnosis not present

## 2015-11-25 DIAGNOSIS — M0589 Other rheumatoid arthritis with rheumatoid factor of multiple sites: Secondary | ICD-10-CM | POA: Diagnosis not present

## 2015-12-24 DIAGNOSIS — J029 Acute pharyngitis, unspecified: Secondary | ICD-10-CM | POA: Diagnosis not present

## 2015-12-24 DIAGNOSIS — H0520 Unspecified exophthalmos: Secondary | ICD-10-CM | POA: Diagnosis not present

## 2015-12-24 DIAGNOSIS — E559 Vitamin D deficiency, unspecified: Secondary | ICD-10-CM | POA: Diagnosis not present

## 2015-12-24 DIAGNOSIS — E0500 Thyrotoxicosis with diffuse goiter without thyrotoxic crisis or storm: Secondary | ICD-10-CM | POA: Diagnosis not present

## 2015-12-24 DIAGNOSIS — R7309 Other abnormal glucose: Secondary | ICD-10-CM | POA: Diagnosis not present

## 2015-12-24 DIAGNOSIS — E039 Hypothyroidism, unspecified: Secondary | ICD-10-CM | POA: Diagnosis not present

## 2015-12-24 DIAGNOSIS — H40023 Open angle with borderline findings, high risk, bilateral: Secondary | ICD-10-CM | POA: Diagnosis not present

## 2015-12-24 DIAGNOSIS — Z87891 Personal history of nicotine dependence: Secondary | ICD-10-CM | POA: Diagnosis not present

## 2015-12-24 DIAGNOSIS — Z0000 Encounter for general adult medical examination without abnormal findings: Secondary | ICD-10-CM | POA: Diagnosis not present

## 2016-03-18 DIAGNOSIS — Z853 Personal history of malignant neoplasm of breast: Secondary | ICD-10-CM | POA: Diagnosis not present

## 2016-03-18 DIAGNOSIS — Z803 Family history of malignant neoplasm of breast: Secondary | ICD-10-CM | POA: Diagnosis not present

## 2016-04-08 DIAGNOSIS — Z6830 Body mass index (BMI) 30.0-30.9, adult: Secondary | ICD-10-CM | POA: Diagnosis not present

## 2016-04-08 DIAGNOSIS — Z01419 Encounter for gynecological examination (general) (routine) without abnormal findings: Secondary | ICD-10-CM | POA: Diagnosis not present

## 2016-04-08 DIAGNOSIS — Z124 Encounter for screening for malignant neoplasm of cervix: Secondary | ICD-10-CM | POA: Diagnosis not present

## 2016-04-09 ENCOUNTER — Encounter (HOSPITAL_COMMUNITY): Payer: Self-pay

## 2016-04-29 DIAGNOSIS — R350 Frequency of micturition: Secondary | ICD-10-CM | POA: Diagnosis not present

## 2016-04-29 DIAGNOSIS — R0982 Postnasal drip: Secondary | ICD-10-CM | POA: Diagnosis not present

## 2016-04-29 DIAGNOSIS — R030 Elevated blood-pressure reading, without diagnosis of hypertension: Secondary | ICD-10-CM | POA: Diagnosis not present

## 2016-04-29 DIAGNOSIS — M545 Low back pain: Secondary | ICD-10-CM | POA: Diagnosis not present

## 2016-05-11 ENCOUNTER — Ambulatory Visit: Payer: Self-pay | Admitting: Hematology

## 2016-05-11 ENCOUNTER — Other Ambulatory Visit: Payer: Self-pay

## 2016-05-12 DIAGNOSIS — M25561 Pain in right knee: Secondary | ICD-10-CM | POA: Diagnosis not present

## 2016-05-12 DIAGNOSIS — M1711 Unilateral primary osteoarthritis, right knee: Secondary | ICD-10-CM | POA: Diagnosis not present

## 2016-05-12 DIAGNOSIS — G8929 Other chronic pain: Secondary | ICD-10-CM | POA: Diagnosis not present

## 2016-05-20 NOTE — Progress Notes (Signed)
Scheduling pre op--please PLACE SURGICAL ORDERS IN EPIC  THANKS

## 2016-05-26 ENCOUNTER — Encounter (HOSPITAL_COMMUNITY): Payer: Self-pay | Admitting: *Deleted

## 2016-06-01 DIAGNOSIS — Z79899 Other long term (current) drug therapy: Secondary | ICD-10-CM | POA: Diagnosis not present

## 2016-06-01 DIAGNOSIS — M0589 Other rheumatoid arthritis with rheumatoid factor of multiple sites: Secondary | ICD-10-CM | POA: Diagnosis not present

## 2016-06-04 DIAGNOSIS — M1711 Unilateral primary osteoarthritis, right knee: Secondary | ICD-10-CM | POA: Diagnosis not present

## 2016-06-04 NOTE — H&P (Signed)
TOTAL KNEE ADMISSION H&P  Patient is being admitted for right total knee arthroplasty.  Subjective:  Chief Complaint:    Right knee primary OA / pain  HPI: Evelyn Orr, 71 y.o. female, has a history of pain and functional disability in the right knee due to arthritis and has failed non-surgical conservative treatments for greater than 12 weeks to include NSAID's and/or analgesics, corticosteriod injections and activity modification.  Onset of symptoms was gradual, starting 2+ years ago with gradually worsening course since that time. The patient noted prior procedures on the knee to include  arthroscopy on the right knee(s).  Patient currently rates pain in the right knee(s) at 8 out of 10 with activity. Patient has night pain, worsening of pain with activity and weight bearing, pain that interferes with activities of daily living, pain with passive range of motion, crepitus and joint swelling.  Patient has evidence of periarticular osteophytes and joint space narrowing by imaging studies.  There is no active infection.  Risks, benefits and expectations were discussed with the patient.  Risks including but not limited to the risk of anesthesia, blood clots, nerve damage, blood vessel damage, failure of the prosthesis, infection and up to and including death.  Patient understand the risks, benefits and expectations and wishes to proceed with surgery.   PCP: Maximino Greenland, MD  D/C Plans:       Home  Post-op Meds:       No Rx given   Tranexamic Acid:      To be given - IV   Decadron:      Is to be given  FYI:     ASA  Norco    Patient Active Problem List   Diagnosis Date Noted  . Genetic testing 04/14/2015  . Family history of breast cancer 04/01/2015  . Breast cancer of upper-outer quadrant of right female breast ([place]) 03/20/2015  . [place] 10/11/2013  . Tendonitis, Achilles, right 07/31/2013  . Metatarsal deformity 07/31/2013  . Pain in lower limb 07/31/2013  . Rheumatoid  arthritis ([place]) 03/17/2012  . Thyroid disease 03/17/2012   Past Medical History:  Diagnosis Date  . Arthritis   . Breast cancer ([place])   . Breast cancer of upper-outer quadrant of right female breast ([place]) 03/20/2015  . PONV (postoperative nausea and vomiting)   . Rheumatoid arthritis(714.0)   . Thyroid disease    hypothyroidism    Past Surgical History:  Procedure Laterality Date  . BREAST LUMPECTOMY WITH RADIOACTIVE SEED LOCALIZATION Right 04/09/2015   Procedure: BREAST LUMPECTOMY WITH RADIOACTIVE SEED LOCALIZATION;  Surgeon: Rolm Bookbinder, MD;  Location: [place];  Service: General;  Laterality: Right;  . BREAST REDUCTION SURGERY  2001  . CALCANEAL OSTEOTOMY Left 10/11/2013   Procedure: LEFT CALCANEAL OSTEOTOMY;  Surgeon: Wylene Simmer, MD;  Location: [place];  Service: Orthopedics;  Laterality: Left;  . CARPAL TUNNEL RELEASE  2006   rt  . CARPAL TUNNEL RELEASE     left  . CERVICAL FUSION  2009  . COLONOSCOPY     2002,2005,2008 w/Brodie  . GASTROCNEMIUS RECESSION Left 10/11/2013   Procedure: LEFT GASTROC RECESSION; LEFT POSTERIOR TIBIAL TENOLYSIS;  Surgeon: Wylene Simmer, MD;  Location: [place];  Service: Orthopedics;  Laterality: Left;  . KNEE ARTHROSCOPY  2004   right  . POLYPECTOMY    . REMOVAL OF IMPLANT Left 02/14/2014   Procedure: LEFT FOOT REMOVAL OF DEEP IMPLANT;  Surgeon: Wylene Simmer, MD;  Location: [place]  [place];  Service: Orthopedics;  Laterality: Left;  . SHOULDER ARTHROSCOPY W/ ROTATOR CUFF REPAIR  2006   right  . THYROIDECTOMY     age 8  . WRIST ARTHRODESIS  2003   left-rt knee injection    No prescriptions prior to admission.   No Known Allergies   Social History  Substance Use Topics  . Smoking status: Former Smoker    Packs/day: 0.25    Years: 21.00    Types: Cigarettes    Quit date: 02/13/1984  . Smokeless tobacco: Never Used  . Alcohol use No    Family History  Problem Relation  Age of Onset  . Heart disease Father   . Heart attack Father   . Hypertension Mother   . Breast cancer Paternal Aunt     dx. 46 or younger  . Colon cancer Paternal Uncle   . Prostate cancer Maternal Uncle   . Other Sister     high breast density  . Prostate cancer Brother 3    unknown Gleason  . Prostate cancer Maternal Grandfather     dx. older age  . Colon polyps Son 8    colonoscopy for unspecified reason; found some colon polyps, has not had one since  . Prostate cancer Maternal Uncle   . Prostate cancer Maternal Uncle   . Colon cancer Other   . Cancer Other     MGM's sister  . Breast cancer Cousin     4 maternal cousin had breast cancer   . Rectal cancer Neg Hx   . Stomach cancer Neg Hx      Review of Systems  Constitutional: Negative.   HENT: Negative.   Eyes: Negative.   Respiratory: Negative.   Cardiovascular: Negative.   Gastrointestinal: Negative.   Genitourinary: Negative.   Musculoskeletal: Positive for joint pain.  Skin: Negative.   Neurological: Negative.   Endo/Heme/Allergies: Negative.   Psychiatric/Behavioral: Negative.     Objective:  Physical Exam  Constitutional: She is oriented to person, place, and time. She appears well-developed.  HENT:  Head: Normocephalic.  Eyes: Pupils are equal, round, and reactive to light.  Neck: Neck supple. No JVD present. No tracheal deviation present. No thyromegaly present.  Cardiovascular: Normal rate, regular rhythm, normal heart sounds and intact distal pulses.   Respiratory: Effort normal and breath sounds normal. No stridor. No respiratory distress. She has no wheezes.  GI: Soft. There is no tenderness. There is no guarding.  Musculoskeletal:       Right knee: She exhibits decreased range of motion, swelling and bony tenderness. She exhibits no ecchymosis, no deformity, no laceration, no erythema and normal alignment. Tenderness found.  Lymphadenopathy:    She has no cervical adenopathy.  Neurological:  She is alert and oriented to person, place, and time.  Skin: Skin is warm and dry.  Psychiatric: She has a normal mood and affect.      Labs:  Estimated body mass index is 30.27 kg/m as calculated from the following:   Height as of 11/10/15: 5\' 2"  (1.575 m).   Weight as of 11/10/15: 75.1 kg (165 lb 8 oz).   Imaging Review Plain radiographs demonstrate severe degenerative joint disease of the right knee(s). The overall alignment isneutral. The bone quality appears to be good for age and reported activity level.  Assessment/Plan:  End stage arthritis, right knee   The patient history, physical examination, clinical judgment of the provider and imaging studies are consistent with end stage degenerative joint  disease of the right knee(s) and total knee arthroplasty is deemed medically necessary. The treatment options including medical management, injection therapy arthroscopy and arthroplasty were discussed at length. The risks and benefits of total knee arthroplasty were presented and reviewed. The risks due to aseptic loosening, infection, stiffness, patella tracking problems, thromboembolic complications and other imponderables were discussed. The patient acknowledged the explanation, agreed to proceed with the plan and consent was signed. Patient is being admitted for inpatient treatment for surgery, pain control, PT, OT, prophylactic antibiotics, VTE prophylaxis, progressive ambulation and ADL's and discharge planning. The patient is planning to be discharged home.      [place] Janari Yamada   PA-C  06/04/2016, 3:10 PM

## 2016-06-14 NOTE — Patient Instructions (Addendum)
[place]  06/14/2016   Your procedure is scheduled on: Monday 06/21/2016  Report to [facility]  Entrance take [facility]  elevators to 3rd floor to  [place] at  1100  AM.  Call this number if you have problems the morning of surgery [phone]   Remember: ONLY 1 PERSON MAY GO WITH YOU TO SHORT STAY TO GET  READY MORNING OF [place].    Do not eat food  :After Midnight.  MAY HAVE CLEAR LIQUIDS FROM MIDNIGHT UP UNTIL 0700 AM THE MORNING OF SURGERY THEN NOTHING UNTIL AFTER SURGERY!     CLEAR LIQUID DIET   Foods Allowed                                                                     Foods Excluded  Coffee and tea, regular and decaf                             liquids that you cannot  Plain Jell-O in any flavor                                             see through such as: Fruit ices (not with fruit pulp)                                     milk, soups, orange juice  Iced Popsicles                                    All solid food Carbonated beverages, regular and diet                                    Cranberry, grape and apple juices Sports drinks like Gatorade Lightly seasoned clear broth or consume(fat free) Sugar, honey syrup  Sample Menu Breakfast                                Lunch                                     Supper Cranberry juice                    Beef broth                            Chicken broth Jell-O  Grape juice                           Apple juice Coffee or tea                        Jell-O                                      Popsicle                                                Coffee or tea                        Coffee or tea  _____________________________________________________________________     Take these medicines the morning of surgery with A SIP OF WATER: Arimidex, Levothyroxine                                  You may not have any metal on  your body including hair pins and              piercings  Do not wear jewelry, make-up, lotions, powders or perfumes, deodorant             Do not wear nail polish.  Do not shave  48 hours prior to surgery.              Men may shave face and neck.   Do not bring valuables to the hospital. [place].  Contacts, dentures or bridgework may not be worn into surgery.  Leave suitcase in the car. After surgery it may be brought to your room.                  Please read over the following fact sheets you were given: _____________________________________________________________________             [facility] - Preparing for Surgery Before surgery, you can play an important role.  Because skin is not sterile, your skin needs to be as free of germs as possible.  You can reduce the number of germs on your skin by washing with CHG (chlorahexidine gluconate) soap before surgery.  CHG is an antiseptic cleaner which kills germs and bonds with the skin to continue killing germs even after washing. Please DO NOT use if you have an allergy to CHG or antibacterial soaps.  If your skin becomes reddened/irritated stop using the CHG and inform your nurse when you arrive at Short Stay. Do not shave (including legs and underarms) for at least 48 hours prior to the first CHG shower.  You may shave your face/neck. Please follow these instructions carefully:  1.  Shower with CHG Soap the night before surgery and the  morning of Surgery.  2.  If you choose to wash your hair, wash your hair first as usual with your  normal  shampoo.  3.  After you shampoo, rinse your hair and body thoroughly to remove the  shampoo.  4.  Use CHG as you would any other liquid soap.  You can apply chg directly  to the skin and wash                       Gently with a scrungie or clean washcloth.  5.  Apply the CHG Soap to your body ONLY FROM THE NECK DOWN.   Do  not use on face/ open                           Wound or open sores. Avoid contact with eyes, ears mouth and genitals (private parts).                       Wash face,  Genitals (private parts) with your normal soap.             6.  Wash thoroughly, paying special attention to the area where your surgery  will be performed.  7.  Thoroughly rinse your body with warm water from the neck down.  8.  DO NOT shower/wash with your normal soap after using and rinsing off  the CHG Soap.                9.  Pat yourself dry with a clean towel.            10.  Wear clean pajamas.            11.  Place clean sheets on your bed the night of your first shower and do not  sleep with pets. Day of Surgery : Do not apply any lotions/deodorants the morning of surgery.  Please wear clean clothes to the hospital/surgery center.  FAILURE TO FOLLOW THESE INSTRUCTIONS MAY RESULT IN THE CANCELLATION OF YOUR SURGERY PATIENT SIGNATURE_________________________________  NURSE SIGNATURE__________________________________  ________________________________________________________________________   Adam Phenix  An incentive spirometer is a tool that can help keep your lungs clear and active. This tool measures how well you are filling your lungs with each breath. Taking long deep breaths may help reverse or decrease the chance of developing breathing (pulmonary) problems (especially infection) following:  A long period of time when you are unable to move or be active. BEFORE THE PROCEDURE   If the spirometer includes an indicator to show your best effort, your nurse or respiratory therapist will set it to a desired goal.  If possible, sit up straight or lean slightly forward. Try not to slouch.  Hold the incentive spirometer in an upright position. INSTRUCTIONS FOR USE  1. Sit on the edge of your bed if possible, or sit up as far as you can in bed or on a chair. 2. Hold the incentive spirometer in an upright  position. 3. Breathe out normally. 4. Place the mouthpiece in your mouth and seal your lips tightly around it. 5. Breathe in slowly and as deeply as possible, raising the piston or the ball toward the top of the column. 6. Hold your breath for 3-5 seconds or for as long as possible. Allow the piston or ball to fall to the bottom of the column. 7. Remove the mouthpiece from your mouth and breathe out normally. 8. Rest for a few seconds and repeat Steps 1 through 7 at least 10 times every 1-2 hours when you are awake. Take your time and take a few normal breaths between deep breaths. 9. The spirometer may include an indicator to  show your best effort. Use the indicator as a goal to work toward during each repetition. 10. After each set of 10 deep breaths, practice coughing to be sure your lungs are clear. If you have an incision (the cut made at the time of surgery), support your incision when coughing by placing a pillow or rolled up towels firmly against it. Once you are able to get out of bed, walk around indoors and cough well. You may stop using the incentive spirometer when instructed by your caregiver.  RISKS AND COMPLICATIONS  Take your time so you do not get dizzy or light-headed.  If you are in pain, you may need to take or ask for pain medication before doing incentive spirometry. It is harder to take a deep breath if you are having pain. AFTER USE  Rest and breathe slowly and easily.  It can be helpful to keep track of a log of your progress. Your caregiver can provide you with a simple table to help with this. If you are using the spirometer at home, follow these instructions: [place] IF:   You are having difficultly using the spirometer.  You have trouble using the spirometer as often as instructed.  Your pain medication is not giving enough relief while using the spirometer.  You develop fever of 100.5 F (38.1 C) or higher. SEEK IMMEDIATE MEDICAL CARE IF:    You cough up bloody sputum that had not been present before.  You develop fever of 102 F (38.9 C) or greater.  You develop worsening pain at or near the incision site. MAKE SURE YOU:   Understand these instructions.  Will watch your condition.  Will get help right away if you are not doing well or get worse. Document Released: 09/27/2006 Document Revised: 08/09/2011 Document Reviewed: 11/28/2006 Exit[employer] Patient Information 2014 [employer], [place].   ________________________________________________________________________  WHAT IS A BLOOD TRANSFUSION? Blood Transfusion Information  A transfusion is the replacement of blood or some of its parts. Blood is made up of multiple cells which provide different functions.  Red blood cells carry oxygen and are used for blood loss replacement.  White blood cells fight against infection.  Platelets control bleeding.  Plasma helps clot blood.  Other blood products are available for specialized needs, such as hemophilia or other clotting disorders. BEFORE THE TRANSFUSION  Who gives blood for transfusions?   Healthy volunteers who are fully evaluated to make sure their blood is safe. This is blood bank blood. Transfusion therapy is the safest it has ever been in the practice of medicine. Before blood is taken from a donor, a complete history is taken to make sure that person has no history of diseases nor engages in risky social behavior (examples are intravenous drug use or sexual activity with multiple partners). The donor's travel history is screened to minimize risk of transmitting infections, such as malaria. The donated blood is tested for signs of infectious diseases, such as HIV and hepatitis. The blood is then tested to be sure it is compatible with you in order to minimize the chance of a transfusion reaction. If you or a relative donates blood, this is often done in anticipation of surgery and is not appropriate for emergency  situations. It takes many days to process the donated blood. RISKS AND COMPLICATIONS Although transfusion therapy is very safe and saves many lives, the main dangers of transfusion include:   Getting an infectious disease.  Developing a transfusion reaction. This is an allergic reaction  to something in the blood you were given. Every precaution is taken to prevent this. The decision to have a blood transfusion has been considered carefully by your caregiver before blood is given. Blood is not given unless the benefits outweigh the risks. AFTER THE TRANSFUSION  Right after receiving a blood transfusion, you will usually feel much better and more energetic. This is especially true if your red blood cells have gotten low (anemic). The transfusion raises the level of the red blood cells which carry oxygen, and this usually causes an energy increase.  The nurse administering the transfusion will monitor you carefully for complications. HOME CARE INSTRUCTIONS  No special instructions are needed after a transfusion. You may find your energy is better. Speak with your caregiver about any limitations on activity for underlying diseases you may have. SEEK MEDICAL CARE IF:   Your condition is not improving after your transfusion.  You develop redness or irritation at the intravenous (IV) site. SEEK IMMEDIATE MEDICAL CARE IF:  Any of the following symptoms occur over the next 12 hours:  Shaking chills.  You have a temperature by mouth above 102 F (38.9 C), not controlled by medicine.  Chest, back, or muscle pain.  People around you feel you are not acting correctly or are confused.  Shortness of breath or difficulty breathing.  Dizziness and fainting.  You get a rash or develop hives.  You have a decrease in urine output.  Your urine turns a dark color or changes to pink, red, or brown. Any of the following symptoms occur over the next 10 days:  You have a temperature by mouth above  102 F (38.9 C), not controlled by medicine.  Shortness of breath.  Weakness after normal activity.  The white part of the eye turns yellow (jaundice).  You have a decrease in the amount of urine or are urinating less often.  Your urine turns a dark color or changes to pink, red, or brown. Document Released: 05/14/2000 Document Revised: 08/09/2011 Document Reviewed: 01/01/2008 [facility] Patient Information 2014 [place], [place].  _______________________________________________________________________

## 2016-06-15 ENCOUNTER — Encounter (HOSPITAL_COMMUNITY)
Admission: RE | Admit: 2016-06-15 | Discharge: 2016-06-15 | Disposition: A | Discharge status: Home / Self Care | Payer: Medicare Other | Source: Ambulatory Visit | Attending: Orthopedic Surgery | Admitting: Orthopedic Surgery

## 2016-06-15 ENCOUNTER — Other Ambulatory Visit: Payer: Self-pay

## 2016-06-15 ENCOUNTER — Encounter (INDEPENDENT_AMBULATORY_CARE_PROVIDER_SITE_OTHER): Payer: Self-pay

## 2016-06-15 ENCOUNTER — Encounter (HOSPITAL_COMMUNITY): Payer: Self-pay

## 2016-06-15 ENCOUNTER — Ambulatory Visit: Payer: Self-pay | Admitting: Hematology

## 2016-06-15 DIAGNOSIS — Z01812 Encounter for preprocedural laboratory examination: Secondary | ICD-10-CM | POA: Diagnosis not present

## 2016-06-15 DIAGNOSIS — M1711 Unilateral primary osteoarthritis, right knee: Secondary | ICD-10-CM | POA: Diagnosis not present

## 2016-06-15 HISTORY — DX: Hypothyroidism, unspecified: E03.9

## 2016-06-15 LAB — BASIC METABOLIC PANEL
Anion gap: 8 (ref 5–15)
BUN: 9 mg/dL (ref 6–20)
CHLORIDE: 102 mmol/L (ref 101–111)
CO2: 30 mmol/L (ref 22–32)
Calcium: 9.5 mg/dL (ref 8.9–10.3)
Creatinine, Ser: 0.62 mg/dL (ref 0.44–1.00)
GFR calc non Af Amer: >60 mL/min (ref >60)
GLUCOSE: 84 mg/dL (ref 65–99)
Potassium: 4.2 mmol/L (ref 3.5–5.1)
SODIUM: 140 mmol/L (ref 135–145)

## 2016-06-15 LAB — SURGICAL PCR SCREEN
MRSA, PCR: NEGATIVE
STAPHYLOCOCCUS AUREUS: NEGATIVE

## 2016-06-15 LAB — CBC
HEMATOCRIT: 38.6 % (ref 36.0–46.0)
HEMOGLOBIN: 12.6 g/dL (ref 12.0–15.0)
MCH: 31 pg (ref 26.0–34.0)
MCHC: 32.6 g/dL (ref 30.0–36.0)
MCV: 94.8 fL (ref 78.0–100.0)
Platelets: 333 10*3/uL (ref 150–400)
RBC: 4.07 MIL/uL (ref 3.87–5.11)
RDW: 14.7 % (ref 11.5–15.5)
WBC: 5.7 10*3/uL (ref 4.0–10.5)

## 2016-06-15 LAB — ABO/RH: ABO/RH(D): O POS

## 2016-06-15 NOTE — Progress Notes (Signed)
04/29/2016- office visit and Pre-operative clearance from Dr. [name] on chart. 12/24/2015- Office visit from Dr. [name] and labs on chart-HGA1c, Vit-25, Lipid panel, CBC, CMP 09/28/2013- ekg from [facility] On chart.

## 2016-06-21 ENCOUNTER — Inpatient Hospital Stay (HOSPITAL_COMMUNITY)
Admission: RE | Admit: 2016-06-21 | Discharge: 2016-06-23 | DRG: 470 | Disposition: A | Discharge status: Home / Self Care | Payer: Medicare Other | Source: Ambulatory Visit | Attending: Orthopedic Surgery | Admitting: Orthopedic Surgery

## 2016-06-21 ENCOUNTER — Encounter (HOSPITAL_COMMUNITY)
Admission: RE | Disposition: A | Discharge status: Home / Self Care | Payer: Self-pay | Source: Ambulatory Visit | Attending: Orthopedic Surgery

## 2016-06-21 ENCOUNTER — Inpatient Hospital Stay (HOSPITAL_COMMUNITY): Payer: Medicare Other | Admitting: Anesthesiology

## 2016-06-21 ENCOUNTER — Encounter (HOSPITAL_COMMUNITY): Payer: Self-pay | Admitting: *Deleted

## 2016-06-21 DIAGNOSIS — G8918 Other acute postprocedural pain: Secondary | ICD-10-CM | POA: Diagnosis not present

## 2016-06-21 DIAGNOSIS — Z87891 Personal history of nicotine dependence: Secondary | ICD-10-CM | POA: Diagnosis not present

## 2016-06-21 DIAGNOSIS — Z853 Personal history of malignant neoplasm of breast: Secondary | ICD-10-CM | POA: Diagnosis not present

## 2016-06-21 DIAGNOSIS — Z96659 Presence of unspecified artificial knee joint: Secondary | ICD-10-CM

## 2016-06-21 DIAGNOSIS — M65861 Other synovitis and tenosynovitis, right lower leg: Secondary | ICD-10-CM | POA: Diagnosis present

## 2016-06-21 DIAGNOSIS — Z8249 Family history of ischemic heart disease and other diseases of the circulatory system: Secondary | ICD-10-CM | POA: Diagnosis not present

## 2016-06-21 DIAGNOSIS — E039 Hypothyroidism, unspecified: Secondary | ICD-10-CM | POA: Diagnosis present

## 2016-06-21 DIAGNOSIS — M069 Rheumatoid arthritis, unspecified: Secondary | ICD-10-CM | POA: Diagnosis present

## 2016-06-21 DIAGNOSIS — E663 Overweight: Secondary | ICD-10-CM | POA: Diagnosis present

## 2016-06-21 DIAGNOSIS — Z6829 Body mass index (BMI) 29.0-29.9, adult: Secondary | ICD-10-CM

## 2016-06-21 DIAGNOSIS — Z981 Arthrodesis status: Secondary | ICD-10-CM | POA: Diagnosis not present

## 2016-06-21 DIAGNOSIS — M1711 Unilateral primary osteoarthritis, right knee: Principal | ICD-10-CM | POA: Diagnosis present

## 2016-06-21 DIAGNOSIS — E079 Disorder of thyroid, unspecified: Secondary | ICD-10-CM | POA: Diagnosis not present

## 2016-06-21 DIAGNOSIS — Z96651 Presence of right artificial knee joint: Secondary | ICD-10-CM

## 2016-06-21 HISTORY — DX: Other seasonal allergic rhinitis: J30.2

## 2016-06-21 HISTORY — PX: TOTAL KNEE ARTHROPLASTY: SHX125

## 2016-06-21 HISTORY — DX: Cough, unspecified: R05.9

## 2016-06-21 HISTORY — DX: Nasal congestion: R09.81

## 2016-06-21 HISTORY — DX: Cough: R05

## 2016-06-21 LAB — TYPE AND SCREEN
ABO/RH(D): O POS
ANTIBODY SCREEN: NEGATIVE

## 2016-06-21 SURGERY — ARTHROPLASTY, KNEE, TOTAL
Anesthesia: General | Site: Knee | Laterality: Right

## 2016-06-21 MED ORDER — METOCLOPRAMIDE HCL 5 MG/ML IJ SOLN: 5.0000 mg | Freq: Three times a day (TID) | INTRAMUSCULAR | Status: DC | PRN

## 2016-06-21 MED ORDER — MAGNESIUM CITRATE PO SOLN: 1.0000 | Freq: Once | ORAL | Status: DC | PRN

## 2016-06-21 MED ORDER — FENTANYL CITRATE (PF) 100 MCG/2ML IJ SOLN
INTRAMUSCULAR | Status: AC
  Filled 2016-06-21: qty 2

## 2016-06-21 MED ORDER — PROPOFOL 10 MG/ML IV BOLUS
INTRAVENOUS | Status: AC
  Filled 2016-06-21: qty 20

## 2016-06-21 MED ORDER — BUPIVACAINE HCL (PF) 0.5 % IJ SOLN
INTRAMUSCULAR | Status: AC
  Filled 2016-06-21: qty 30

## 2016-06-21 MED ORDER — PHENYLEPHRINE HCL 10 MG/ML IJ SOLN
INTRAVENOUS | Status: DC | PRN
  Administered 2016-06-21: 25 ug/min via INTRAVENOUS

## 2016-06-21 MED ORDER — CELECOXIB 200 MG PO CAPS
200.0000 mg | ORAL_CAPSULE | Freq: Two times a day (BID) | ORAL | Status: DC
  Administered 2016-06-21 – 2016-06-23 (×4): 200 mg via ORAL
  Filled 2016-06-21 (×4): qty 1

## 2016-06-21 MED ORDER — CHLORHEXIDINE GLUCONATE 4 % EX LIQD: 60.0000 mL | Freq: Once | CUTANEOUS | Status: DC

## 2016-06-21 MED ORDER — HYDROCODONE-ACETAMINOPHEN 7.5-325 MG PO TABS
1.0000 | ORAL_TABLET | ORAL | Status: DC
  Administered 2016-06-21 (×2): 1 via ORAL
  Administered 2016-06-22 (×4): 2 via ORAL
  Administered 2016-06-22 (×2): 1 via ORAL
  Administered 2016-06-23 (×3): 2 via ORAL
  Administered 2016-06-23: 1 via ORAL
  Filled 2016-06-21: qty 1
  Filled 2016-06-21 (×6): qty 2
  Filled 2016-06-21: qty 1
  Filled 2016-06-21 (×3): qty 2

## 2016-06-21 MED ORDER — CEFAZOLIN SODIUM-DEXTROSE 2-4 GM/100ML-% IV SOLN
2.0000 g | Freq: Four times a day (QID) | INTRAVENOUS | Status: AC
  Administered 2016-06-21 – 2016-06-22 (×2): 2 g via INTRAVENOUS
  Filled 2016-06-21 (×2): qty 100

## 2016-06-21 MED ORDER — BISACODYL 10 MG RE SUPP: 10.0000 mg | Freq: Every day | RECTAL | Status: DC | PRN

## 2016-06-21 MED ORDER — SODIUM CHLORIDE 0.9 % IJ SOLN
INTRAMUSCULAR | Status: DC | PRN
  Administered 2016-06-21: 30 mL

## 2016-06-21 MED ORDER — LACTATED RINGERS IV SOLN
INTRAVENOUS | Status: DC
  Administered 2016-06-21 (×3): via INTRAVENOUS

## 2016-06-21 MED ORDER — HYDROMORPHONE HCL 1 MG/ML IJ SOLN
0.5000 mg | INTRAMUSCULAR | Status: DC | PRN
  Administered 2016-06-22: 1 mg via INTRAVENOUS
  Filled 2016-06-21: qty 1

## 2016-06-21 MED ORDER — BUPIVACAINE HCL (PF) 0.25 % IJ SOLN
INTRAMUSCULAR | Status: AC
  Filled 2016-06-21: qty 30

## 2016-06-21 MED ORDER — LEVOTHYROXINE SODIUM 88 MCG PO TABS
88.0000 ug | ORAL_TABLET | Freq: Every day | ORAL | Status: DC
  Administered 2016-06-22 – 2016-06-23 (×2): 88 ug via ORAL
  Filled 2016-06-21 (×2): qty 1

## 2016-06-21 MED ORDER — DEXTROSE 5 % IV SOLN
500.0000 mg | Freq: Four times a day (QID) | INTRAVENOUS | Status: DC | PRN
  Administered 2016-06-21: 500 mg via INTRAVENOUS
  Filled 2016-06-21 (×2): qty 5

## 2016-06-21 MED ORDER — LACTATED RINGERS IR SOLN
Status: DC | PRN
  Administered 2016-06-21: 1000 mL

## 2016-06-21 MED ORDER — KETOROLAC TROMETHAMINE 30 MG/ML IJ SOLN
INTRAMUSCULAR | Status: DC | PRN
  Administered 2016-06-21: 30 mg

## 2016-06-21 MED ORDER — DIPHENHYDRAMINE HCL 25 MG PO CAPS: 25.0000 mg | ORAL_CAPSULE | Freq: Four times a day (QID) | ORAL | Status: DC | PRN

## 2016-06-21 MED ORDER — PHENYLEPHRINE 40 MCG/ML (10ML) SYRINGE FOR IV PUSH (FOR BLOOD PRESSURE SUPPORT)
PREFILLED_SYRINGE | INTRAVENOUS | Status: AC
  Filled 2016-06-21: qty 10

## 2016-06-21 MED ORDER — DOCUSATE SODIUM 100 MG PO CAPS
100.0000 mg | ORAL_CAPSULE | Freq: Two times a day (BID) | ORAL | Status: DC
  Administered 2016-06-21 – 2016-06-23 (×4): 100 mg via ORAL
  Filled 2016-06-21 (×4): qty 1

## 2016-06-21 MED ORDER — PHENYLEPHRINE HCL 10 MG/ML IJ SOLN
INTRAMUSCULAR | Status: DC | PRN
  Administered 2016-06-21 (×2): 80 ug via INTRAVENOUS
  Administered 2016-06-21: 120 ug via INTRAVENOUS
  Administered 2016-06-21 (×2): 80 ug via INTRAVENOUS

## 2016-06-21 MED ORDER — ALUM & MAG HYDROXIDE-SIMETH 200-200-20 MG/5ML PO SUSP: 30.0000 mL | ORAL | Status: DC | PRN

## 2016-06-21 MED ORDER — METHOCARBAMOL 500 MG PO TABS: 500.0000 mg | ORAL_TABLET | Freq: Four times a day (QID) | ORAL | 0 refills | Status: DC | PRN

## 2016-06-21 MED ORDER — HYDROMORPHONE HCL 1 MG/ML IJ SOLN: 0.2500 mg | INTRAMUSCULAR | Status: DC | PRN

## 2016-06-21 MED ORDER — METOCLOPRAMIDE HCL 5 MG PO TABS: 5.0000 mg | ORAL_TABLET | Freq: Three times a day (TID) | ORAL | Status: DC | PRN

## 2016-06-21 MED ORDER — CEFAZOLIN SODIUM-DEXTROSE 2-4 GM/100ML-% IV SOLN
2.0000 g | INTRAVENOUS | Status: AC
  Administered 2016-06-21: 2 g via INTRAVENOUS
  Filled 2016-06-21: qty 100

## 2016-06-21 MED ORDER — SODIUM CHLORIDE 0.9 % IV SOLN
INTRAVENOUS | Status: DC
  Administered 2016-06-21: 22:00:00 via INTRAVENOUS
  Filled 2016-06-21 (×6): qty 1000

## 2016-06-21 MED ORDER — MENTHOL 3 MG MT LOZG: 1.0000 | LOZENGE | OROMUCOSAL | Status: DC | PRN

## 2016-06-21 MED ORDER — EPINEPHRINE PF 1 MG/ML IJ SOLN
INTRAMUSCULAR | Status: AC
  Filled 2016-06-21: qty 1

## 2016-06-21 MED ORDER — CEFAZOLIN SODIUM-DEXTROSE 2-4 GM/100ML-% IV SOLN
INTRAVENOUS | Status: AC
  Filled 2016-06-21: qty 100

## 2016-06-21 MED ORDER — METHOCARBAMOL 500 MG PO TABS
500.0000 mg | ORAL_TABLET | Freq: Four times a day (QID) | ORAL | Status: DC | PRN
  Administered 2016-06-22 – 2016-06-23 (×3): 500 mg via ORAL
  Filled 2016-06-21 (×3): qty 1

## 2016-06-21 MED ORDER — SODIUM CHLORIDE 0.9 % IJ SOLN
INTRAMUSCULAR | Status: AC
  Filled 2016-06-21: qty 50

## 2016-06-21 MED ORDER — POLYETHYLENE GLYCOL 3350 17 G PO PACK
17.0000 g | PACK | Freq: Two times a day (BID) | ORAL | Status: DC
  Administered 2016-06-21 – 2016-06-23 (×4): 17 g via ORAL
  Filled 2016-06-21 (×4): qty 1

## 2016-06-21 MED ORDER — ASPIRIN 81 MG PO CHEW
81.0000 mg | CHEWABLE_TABLET | Freq: Two times a day (BID) | ORAL | Status: DC
  Administered 2016-06-22 – 2016-06-23 (×3): 81 mg via ORAL
  Filled 2016-06-21 (×3): qty 1

## 2016-06-21 MED ORDER — PROPOFOL 500 MG/50ML IV EMUL
INTRAVENOUS | Status: DC | PRN
  Administered 2016-06-21: 75 ug/kg/min via INTRAVENOUS

## 2016-06-21 MED ORDER — MIDAZOLAM HCL 2 MG/2ML IJ SOLN
1.0000 mg | INTRAMUSCULAR | Status: DC | PRN
  Administered 2016-06-21: 2 mg via INTRAVENOUS

## 2016-06-21 MED ORDER — 0.9 % SODIUM CHLORIDE (POUR BTL) OPTIME
TOPICAL | Status: DC | PRN
  Administered 2016-06-21: 1000 mL

## 2016-06-21 MED ORDER — ONDANSETRON HCL 4 MG/2ML IJ SOLN
4.0000 mg | Freq: Four times a day (QID) | INTRAMUSCULAR | Status: DC | PRN
  Administered 2016-06-22: 4 mg via INTRAVENOUS
  Filled 2016-06-21: qty 2

## 2016-06-21 MED ORDER — BUPIVACAINE HCL (PF) 0.75 % IJ SOLN
INTRAMUSCULAR | Status: DC | PRN
  Administered 2016-06-21: 2 mL via INTRATHECAL

## 2016-06-21 MED ORDER — DEXAMETHASONE SODIUM PHOSPHATE 10 MG/ML IJ SOLN
10.0000 mg | Freq: Once | INTRAMUSCULAR | Status: AC
  Administered 2016-06-21: 10 mg via INTRAVENOUS

## 2016-06-21 MED ORDER — PROPOFOL 10 MG/ML IV BOLUS
INTRAVENOUS | Status: AC
  Filled 2016-06-21: qty 60

## 2016-06-21 MED ORDER — LORATADINE 10 MG PO TABS
10.0000 mg | ORAL_TABLET | Freq: Every day | ORAL | Status: DC
  Administered 2016-06-22 – 2016-06-23 (×2): 10 mg via ORAL
  Filled 2016-06-21 (×2): qty 1

## 2016-06-21 MED ORDER — CELECOXIB 200 MG PO CAPS: 200.0000 mg | ORAL_CAPSULE | Freq: Two times a day (BID) | ORAL | 0 refills | Status: DC

## 2016-06-21 MED ORDER — PHENOL 1.4 % MT LIQD
1.0000 | OROMUCOSAL | Status: DC | PRN
Start: 2016-06-21 — End: 2016-06-23

## 2016-06-21 MED ORDER — BUPIVACAINE-EPINEPHRINE (PF) 0.25% -1:200000 IJ SOLN
INTRAMUSCULAR | Status: DC | PRN
  Administered 2016-06-21: 20 mL

## 2016-06-21 MED ORDER — FENTANYL CITRATE (PF) 100 MCG/2ML IJ SOLN
50.0000 ug | INTRAMUSCULAR | Status: DC | PRN
  Administered 2016-06-21 (×2): 100 ug via INTRAVENOUS

## 2016-06-21 MED ORDER — STERILE WATER FOR IRRIGATION IR SOLN
Status: DC | PRN
  Administered 2016-06-21: 2000 mL

## 2016-06-21 MED ORDER — TRANEXAMIC ACID 1000 MG/10ML IV SOLN
1000.0000 mg | INTRAVENOUS | Status: AC
  Administered 2016-06-21: 1000 mg via INTRAVENOUS
  Filled 2016-06-21: qty 10

## 2016-06-21 MED ORDER — DEXAMETHASONE SODIUM PHOSPHATE 10 MG/ML IJ SOLN: 10.0000 mg | Freq: Once | INTRAMUSCULAR | Status: DC

## 2016-06-21 MED ORDER — HYDROCODONE-ACETAMINOPHEN 7.5-325 MG PO TABS: 1.0000 | ORAL_TABLET | ORAL | 0 refills | Status: DC | PRN

## 2016-06-21 MED ORDER — MIDAZOLAM HCL 2 MG/2ML IJ SOLN
INTRAMUSCULAR | Status: AC
  Filled 2016-06-21: qty 2

## 2016-06-21 MED ORDER — FERROUS SULFATE 325 (65 FE) MG PO TABS
325.0000 mg | ORAL_TABLET | Freq: Three times a day (TID) | ORAL | Status: DC
  Administered 2016-06-22 – 2016-06-23 (×4): 325 mg via ORAL
  Filled 2016-06-21 (×4): qty 1

## 2016-06-21 MED ORDER — ASPIRIN 81 MG PO CHEW
81.0000 mg | CHEWABLE_TABLET | Freq: Two times a day (BID) | ORAL | 0 refills | Status: DC
Start: 2016-06-21 — End: 2017-06-14

## 2016-06-21 MED ORDER — ONDANSETRON HCL 4 MG PO TABS: 4.0000 mg | ORAL_TABLET | Freq: Four times a day (QID) | ORAL | Status: DC | PRN

## 2016-06-21 MED ORDER — KETOROLAC TROMETHAMINE 30 MG/ML IJ SOLN
INTRAMUSCULAR | Status: AC
  Filled 2016-06-21: qty 1

## 2016-06-21 MED ORDER — PROPOFOL 500 MG/50ML IV EMUL
INTRAVENOUS | Status: DC | PRN
  Administered 2016-06-21: 30 mg via INTRAVENOUS

## 2016-06-21 MED ORDER — LIDOCAINE HCL (CARDIAC) 20 MG/ML IV SOLN
INTRAVENOUS | Status: DC | PRN
  Administered 2016-06-21: 50 mg via INTRAVENOUS

## 2016-06-21 MED ORDER — ANASTROZOLE 1 MG PO TABS
1.0000 mg | ORAL_TABLET | Freq: Every day | ORAL | Status: DC
  Administered 2016-06-22 – 2016-06-23 (×2): 1 mg via ORAL
  Filled 2016-06-21 (×2): qty 1

## 2016-06-21 SURGICAL SUPPLY — 47 items
ADH SKN CLS APL DERMABOND .7 (GAUZE/BANDAGES/DRESSINGS) ×1
BAG DECANTER FOR FLEXI CONT (MISCELLANEOUS) IMPLANT
BAG SPEC THK2 15X12 ZIP CLS (MISCELLANEOUS)
BAG ZIPLOCK 12X15 (MISCELLANEOUS) IMPLANT
BANDAGE ACE 6X5 VEL STRL LF (GAUZE/BANDAGES/DRESSINGS) ×2 IMPLANT
BLADE SAW SGTL 11.0X1.19X90.0M (BLADE) ×1 IMPLANT
BLADE SAW SGTL 13.0X1.19X90.0M (BLADE) ×2 IMPLANT
BOWL SMART MIX CTS (DISPOSABLE) ×2 IMPLANT
CAPT KNEE TOTAL 3 ATTUNE ×1 IMPLANT
CEMENT HV SMART SET (Cement) ×2 IMPLANT
CLOTH BEACON ORANGE TIMEOUT ST (SAFETY) ×2 IMPLANT
CUFF TOURN SGL QUICK 34 (TOURNIQUET CUFF) ×2
CUFF TRNQT CYL 34X4X40X1 (TOURNIQUET CUFF) ×1 IMPLANT
DECANTER SPIKE VIAL GLASS SM (MISCELLANEOUS) ×2 IMPLANT
DERMABOND ADVANCED (GAUZE/BANDAGES/DRESSINGS) ×1
DERMABOND ADVANCED .7 DNX12 (GAUZE/BANDAGES/DRESSINGS) ×1 IMPLANT
DRAPE U-SHAPE 47X51 STRL (DRAPES) ×2 IMPLANT
DRESSING AQUACEL AG SP 3.5X10 (GAUZE/BANDAGES/DRESSINGS) ×1 IMPLANT
DRSG AQUACEL AG SP 3.5X10 (GAUZE/BANDAGES/DRESSINGS) ×2
DURAPREP 26ML APPLICATOR (WOUND CARE) ×4 IMPLANT
ELECT REM PT RETURN 9FT ADLT (ELECTROSURGICAL) ×2
ELECTRODE REM PT RTRN 9FT ADLT (ELECTROSURGICAL) ×1 IMPLANT
GLOVE BIOGEL M 7.0 STRL (GLOVE) IMPLANT
GLOVE BIOGEL PI IND STRL 7.5 (GLOVE) ×1 IMPLANT
GLOVE BIOGEL PI IND STRL 8.5 (GLOVE) ×1 IMPLANT
GLOVE BIOGEL PI INDICATOR 7.5 (GLOVE) ×1
GLOVE BIOGEL PI INDICATOR 8.5 (GLOVE) ×1
GLOVE ECLIPSE 8.0 STRL XLNG CF (GLOVE) ×2 IMPLANT
GLOVE ORTHO TXT STRL SZ7.5 (GLOVE) ×4 IMPLANT
GOWN STRL REUS W/TWL LRG LVL3 (GOWN DISPOSABLE) ×2 IMPLANT
GOWN STRL REUS W/TWL XL LVL3 (GOWN DISPOSABLE) ×2 IMPLANT
HANDPIECE INTERPULSE COAX TIP (DISPOSABLE) ×2
MANIFOLD NEPTUNE II (INSTRUMENTS) ×2 IMPLANT
PACK TOTAL KNEE CUSTOM (KITS) ×2 IMPLANT
POSITIONER SURGICAL ARM (MISCELLANEOUS) ×2 IMPLANT
SET HNDPC FAN SPRY TIP SCT (DISPOSABLE) ×1 IMPLANT
SET PAD KNEE POSITIONER (MISCELLANEOUS) ×2 IMPLANT
SUT MNCRL AB 4-0 PS2 18 (SUTURE) ×2 IMPLANT
SUT VIC AB 1 CT1 36 (SUTURE) ×2 IMPLANT
SUT VIC AB 2-0 CT1 27 (SUTURE) ×6
SUT VIC AB 2-0 CT1 TAPERPNT 27 (SUTURE) ×3 IMPLANT
SUT VLOC 180 0 24IN GS25 (SUTURE) ×2 IMPLANT
SYR 50ML LL SCALE MARK (SYRINGE) ×2 IMPLANT
SYRINGE 60CC LL (MISCELLANEOUS) ×1 IMPLANT
TRAY FOLEY W/METER SILVER 16FR (SET/KITS/TRAYS/PACK) ×2 IMPLANT
WRAP KNEE MAXI GEL POST OP (GAUZE/BANDAGES/DRESSINGS) ×2 IMPLANT
YANKAUER SUCT BULB TIP 10FT TU (MISCELLANEOUS) ×2 IMPLANT

## 2016-06-21 NOTE — Anesthesia Preprocedure Evaluation (Addendum)
Anesthesia Evaluation  Patient identified by MRN, date of birth, ID band Patient awake    Reviewed: Allergy & Precautions, NPO status , Patient's Chart, lab work & pertinent test results  History of Anesthesia Complications (+) PONV  Airway Mallampati: II  TM Distance: >3 FB     Dental   Pulmonary former smoker,    breath sounds clear to auscultation       Cardiovascular negative cardio ROS   Rhythm:Regular Rate:Normal     Neuro/Psych    GI/Hepatic Neg liver ROS,   Endo/Other  Hypothyroidism   Renal/GU negative Renal ROS     Musculoskeletal  (+) Arthritis ,   Abdominal   Peds  Hematology   Anesthesia Other Findings   Reproductive/Obstetrics                             Anesthesia Physical Anesthesia Plan  ASA: III  Anesthesia Plan: General   Post-op Pain Management:  Regional for Post-op pain   Induction:   Airway Management Planned: Oral ETT  Additional Equipment:   Intra-op Plan:   Post-operative Plan: Extubation in OR  Informed Consent: I have reviewed the patients History and Physical, chart, labs and discussed the procedure including the risks, benefits and alternatives for the proposed anesthesia with the patient or authorized representative who has indicated his/her understanding and acceptance.   Dental advisory given  Plan Discussed with: CRNA and Anesthesiologist  Anesthesia Plan Comments:         Anesthesia Quick Evaluation

## 2016-06-21 NOTE — Discharge Instructions (Signed)

## 2016-06-21 NOTE — Anesthesia Postprocedure Evaluation (Signed)
Anesthesia Post Note  Patient: Danyah Stuckwisch [facility]  Procedure(s) Performed: Procedure(s) (LRB): RIGHT TOTAL KNEE ARTHROPLASTY (Right)  Patient location during evaluation: PACU Anesthesia Type: Spinal Level of consciousness: awake Pain management: pain level controlled Vital Signs Assessment: post-procedure vital signs reviewed and stable Respiratory status: spontaneous breathing Cardiovascular status: stable Postop Assessment: spinal receding Anesthetic complications: no       Last Vitals:  Vitals:   06/21/16 1830 06/21/16 1845  BP: 107/70 121/80  Pulse: 80 72  Resp: 18 15  Temp:      Last Pain:  Vitals:   06/21/16 1845  TempSrc:   PainSc: Asleep    LLE Motor Response: Non-purposeful movement (06/21/16 1845) LLE Sensation: Numbness;No pain;No tingling (06/21/16 1845) RLE Motor Response: Non-purposeful movement (06/21/16 1845) RLE Sensation: Numbness;No pain;No tingling (06/21/16 1845) L Sensory Level: L3-Anterior knee, lower leg (06/21/16 1845) R Sensory Level: L3-Anterior knee, lower leg (06/21/16 1845)  Quaran Kedzierski

## 2016-06-21 NOTE — Transfer of Care (Signed)
Immediate Anesthesia Transfer of Care Note  Patient: Evelyn Orr [facility]  Procedure(s) Performed: Procedure(s): RIGHT TOTAL KNEE ARTHROPLASTY (Right)  Patient Location: PACU  Anesthesia Type:Spinal  Level of Consciousness: sedated, patient cooperative and responds to stimulation  Airway & Oxygen Therapy: Patient Spontanous Breathing and Patient connected to face mask oxygen  Post-op Assessment: Report given to RN and Post -op Vital signs reviewed and stable  Post vital signs: Reviewed and stable  Last Vitals:  Vitals:   06/21/16 1618 06/21/16 1820  BP:    Pulse: 88 89  Resp: 20 17  Temp:      Last Pain:  Vitals:   06/21/16 1426  TempSrc:   PainSc: 5       Patients Stated Pain Goal: 2 ([id number])  Complications: No apparent anesthesia complications

## 2016-06-21 NOTE — Anesthesia Procedure Notes (Signed)
Anesthesia Regional Block:  Adductor canal block  Pre-Anesthetic Checklist: ,, timeout performed, Correct Patient, Correct Site, Correct Laterality, Correct Procedure, Correct Position, site marked, Risks and benefits discussed, Surgical consent,  Pre-op evaluation,  Post-op pain management  Laterality: Right  Prep: chloraprep       Needles:   Needle Type: Stimulator Needle - 80          Additional Needles:  Procedures: Doppler guided and ultrasound guided (picture in chart) Adductor canal block Narrative:  Injection made incrementally with aspirations every 5 mL.  Performed by: Personally  Anesthesiologist: Belinda Block

## 2016-06-21 NOTE — Progress Notes (Signed)
Assisted Dr. Nyoka Cowden with right adductor canal block. Side rails up, monitors on throughout procedure. See vital signs in flow sheet. Tolerated Procedure well.

## 2016-06-21 NOTE — Interval H&P Note (Signed)
History and Physical Interval Note:  06/21/2016 2:15 PM  [name]  has presented today for surgery, with the diagnosis of Right knee osteoarthritis   The various methods of treatment have been discussed with the patient and family. After consideration of risks, benefits and other options for treatment, the patient has consented to  Procedure(s): RIGHT TOTAL KNEE ARTHROPLASTY (Right) as a surgical intervention .  The patient's history has been reviewed, patient examined, no change in status, stable for surgery.  I have reviewed the patient's chart and labs.  Questions were answered to the patient's satisfaction.     Evelyn Orr

## 2016-06-21 NOTE — Op Note (Signed)
NAME:  Evelyn Orr [facility]                      MEDICAL RECORD NO.:  [id number]                             FACILITY:  [facility]      PHYSICIAN:  Pietro Cassis. Alvan Dame, M.D.  DATE OF BIRTH:  [birth year]      DATE OF PROCEDURE:  06/21/2016                                     OPERATIVE REPORT         PREOPERATIVE DIAGNOSIS:  Right knee osteoarthritis.      POSTOPERATIVE DIAGNOSIS:  Right knee osteoarthritis.      FINDINGS:  The patient was noted to have complete loss of cartilage and   bone-on-bone arthritis with associated osteophytes in the patellofemoral and lateral compartments of   the knee with a significant synovitis and associated effusion.      PROCEDURE:  Right total knee replacement.      COMPONENTS USED:  DePuy Attune rotating platform posterior stabilized knee   system, a size 3 femur, 3 tibia, size 7 PS insert, and 32 anatomic patellar   button.      SURGEON:  Pietro Cassis. Alvan Dame, M.D.      ASSISTANT:  Danae Orleans, PA-C.      ANESTHESIA:  Regional and Spinal.      SPECIMENS:  None.      COMPLICATION:  None.      DRAINS:  None.  EBL: <100 cc      TOURNIQUET TIME:   28 min at 250 mmHg      The patient was stable to the recovery room.      INDICATION FOR PROCEDURE:  Evelyn Orr is a 71 y.o. female patient of   mine.  The patient had been seen, evaluated, and treated conservatively in the   office with medication, activity modification, and injections.  The patient had   radiographic changes of bone-on-bone arthritis with endplate sclerosis and osteophytes noted.      The patient failed conservative measures including medication, injections, and activity modification, and at this point was ready for more definitive measures.   Based on the radiographic changes and failed conservative measures, the patient   decided to proceed with total knee replacement.  Risks of infection,   DVT, component failure, need for revision surgery, postop course, and   expectations  were all   discussed and reviewed.  Consent was obtained for benefit of pain   relief.      PROCEDURE IN DETAIL:  The patient was brought to the operative theater.   Once adequate anesthesia, preoperative antibiotics, 2 gm of Ancef, 1 gm of Tranexamic Acid, and 10 mg of Decadron administered, the patient was positioned supine with the right thigh tourniquet placed.  The  right lower extremity was prepped and draped in sterile fashion.  A time-   out was performed identifying the patient, planned procedure, and   extremity.      The right lower extremity was placed in the [facility] leg holder.  The leg was   exsanguinated, tourniquet elevated to 250 mmHg.  A midline incision was   made followed by median parapatellar arthrotomy.  Following initial   exposure,  attention was first directed to the patella.  Precut   measurement was noted to be 20 mm.  I resected down to 13 mm and used a   32 anatomic patellar button to restore patellar height as well as cover the cut   surface.      The lug holes were drilled and a metal shim was placed to protect the   patella from retractors and saw blades.      At this point, attention was now directed to the femur.  The femoral   canal was opened with a drill, irrigated to try to prevent fat emboli.  An   intramedullary rod was passed at 3 degrees valgus, 9 mm of bone was   resected off the distal femur.  Following this resection, the tibia was   subluxated anteriorly.  Using the extramedullary guide, 2 mm of bone was resected off   the proximal medial tibia.  We confirmed the gap would be   stable medially and laterally with a size 5 spacer block as well as confirmed   the cut was perpendicular in the coronal plane, checking with an alignment rod.      Once this was done, I sized the femur to be a size 3 in the anterior-   posterior dimension, chose a standard component based on medial and   lateral dimension.  The size 3 rotation block was then pinned  in   position anterior referenced using the C-clamp to set rotation.  The   anterior, posterior, and  chamfer cuts were made without difficulty nor   notching making certain that I was along the anterior cortex to help   with flexion gap stability.      The final box cut was made off the lateral aspect of distal femur.      At this point, the tibia was sized to be a size 3, the size 3 tray was   then pinned in position through the medial third of the tubercle,   drilled, and keel punched.  Trial reduction was now carried with a 3 femur,  3 tibia, a size 6 then 7 PS insert, and the 32 anatomic patella button.  The knee was brought to   extension, full extension with good flexion stability with the patella   tracking through the trochlea without application of pressure.  Given   all these findings the femoral lug holes were drilled and then the trial components removed.  Final components were   opened and cement was mixed.  The knee was irrigated with normal saline   solution and pulse lavage.  The synovial lining was   then injected with 20 cc of 0.25% Marcaine without epinephrine and 1 cc of Toradol,   total of 61 cc.      The knee was irrigated.  Final implants were then cemented onto clean and   dried cut surfaces of bone with the knee brought to extension with a size 7 PS trial insert.      Once the cement had fully cured, the excess cement was removed   throughout the knee.  I confirmed I was satisfied with the range of   motion and stability, and the final size 7 AOX PS insert was chosen.  It was   placed into the knee.      The tourniquet had been let down at 28 minutes.  No significant   hemostasis required.  The   extensor mechanism was then reapproximated  using #1 Vicryl and #0 V-lock sutures with the knee   in flexion.  The   remaining wound was closed with 2-0 Vicryl and running 4-0 Monocryl.   The knee was cleaned, dried, dressed sterilely using Dermabond and   Aquacel  dressing.  The patient was then   brought to recovery room in stable condition, tolerating the procedure   well.   Please note that Physician Assistant, Danae Orleans, PA-C, was present for the entirety of the case, and was utilized for pre-operative positioning, peri-operative retractor management, general facilitation of the procedure.  He was also utilized for primary wound closure at the end of the case.              Pietro Cassis Alvan Dame, M.D.    06/21/2016 4:35 PM

## 2016-06-21 NOTE — Anesthesia Procedure Notes (Signed)
Spinal  Start time: 06/21/2016 4:25 PM End time: 06/21/2016 4:31 PM Staffing Resident/CRNA: Gean Maidens Performed: resident/CRNA  Preanesthetic Checklist Completed: patient identified, site marked, surgical consent, pre-op evaluation, timeout performed, IV checked, risks and benefits discussed and monitors and equipment checked Spinal Block Patient position: sitting Prep: Betadine Patient monitoring: heart rate, continuous pulse ox and blood pressure Approach: midline Location: L4-5 Needle Needle type: Pencan  Needle gauge: 24 G Needle length: 9 cm Needle insertion depth: 7 cm Additional Notes Pt sitting position, sterile prep and drape, negative paresthesia, negative heme.

## 2016-06-22 ENCOUNTER — Encounter (HOSPITAL_COMMUNITY): Payer: Self-pay | Admitting: Orthopedic Surgery

## 2016-06-22 LAB — CBC
HCT: 36.9 % (ref 36.0–46.0)
Hemoglobin: 12.1 g/dL (ref 12.0–15.0)
MCH: 30.5 pg (ref 26.0–34.0)
MCHC: 32.8 g/dL (ref 30.0–36.0)
MCV: 92.9 fL (ref 78.0–100.0)
Platelets: 326 10*3/uL (ref 150–400)
RBC: 3.97 MIL/uL (ref 3.87–5.11)
RDW: 14.3 % (ref 11.5–15.5)
WBC: 9.7 10*3/uL (ref 4.0–10.5)

## 2016-06-22 LAB — BASIC METABOLIC PANEL
ANION GAP: 9 (ref 5–15)
BUN: 10 mg/dL (ref 6–20)
CALCIUM: 9.2 mg/dL (ref 8.9–10.3)
CO2: 24 mmol/L (ref 22–32)
Chloride: 105 mmol/L (ref 101–111)
Creatinine, Ser: 0.69 mg/dL (ref 0.44–1.00)
GFR calc non Af Amer: >60 mL/min (ref >60)
GLUCOSE: 132 mg/dL — AB (ref 65–99)
Potassium: 4.3 mmol/L (ref 3.5–5.1)
Sodium: 138 mmol/L (ref 135–145)

## 2016-06-22 NOTE — Evaluation (Signed)
Occupational Therapy Evaluation Patient Details Name: Evelyn Orr MRN: [id number] DOB: Oct 01, 1945 Today's Date: 06/22/2016    History of Present Illness Pt is a 71 year old female s/p R TKA   Clinical Impression   Pt was admitted for the above sx. She will benefit from one more session of OT to further assess DME and educate on shower transfer    Follow Up Recommendations  No OT follow up    Equipment Recommendations   (likely none, to be further assessed)    Recommendations for Other Services       Precautions / Restrictions Precautions Precautions: Knee Restrictions Other Position/Activity Restrictions: WBAT      Mobility Bed Mobility Overal bed mobility: Needs Assistance Bed Mobility: Sit to Supine       Sit to supine: Min assist   General bed mobility comments: light min A for RLE  Transfers Overall transfer level: Needs assistance Equipment used: Rolling walker (2 wheeled) Transfers: Sit to/from Stand Sit to Stand: Min guard         General transfer comment: verbal cues for UE and LE positioning    Balance                                            ADL Overall ADL's : Needs assistance/impaired     Grooming: Wash/dry hands;Supervision/safety;Standing       Lower Body Bathing: Minimal assistance;Sit to/from stand       Lower Body Dressing: Minimal assistance;Sit to/from stand   Toilet Transfer: Min guard;Ambulation;Comfort height toilet;RW;Grab bars             General ADL Comments: Reviewed knee precautions and sidestepping through tight spaces.  Pt not ready to practice shower transfer today:  will address tomorrow  Pt has access to AE, but she also has help as needed. (from family)     Vision     Perception     Praxis      Pertinent Vitals/Pain Pain Assessment: 0-10 Pain Score: 6  Pain Location: R knee Pain Descriptors / Indicators: Aching Pain Intervention(s): Limited activity within  patient's tolerance;Monitored during session;Premedicated before session;Repositioned;Ice applied     Hand Dominance     Extremity/Trunk Assessment Upper Extremity Assessment Upper Extremity Assessment: Overall WFL for tasks assessed   Lower Extremity Assessment Lower Extremity Assessment: RLE deficits/detail RLE Deficits / Details: fair quad contraction, ROM TBA       Communication Communication Communication: No difficulties   Cognition Arousal/Alertness: Awake/alert Behavior During Therapy: WFL for tasks assessed/performed Overall Cognitive Status: Within Functional Limits for tasks assessed                     General Comments       Exercises       Shoulder Instructions      Home Living Family/patient expects to be discharged to:: Private residence Living Arrangements: Spouse/significant other Available Help at Discharge: Family Type of Home: House Home Access: Stairs to enter Technical brewer of Steps: 3 Entrance Stairs-Rails: Right Home Layout: One level     Bathroom Shower/Tub: Occupational psychologist: Handicapped height     Home Equipment: Environmental consultant - 2 wheels          Prior Functioning/Environment Level of Independence: Independent  OT Problem List: Decreased strength;Decreased activity tolerance;Decreased knowledge of use of DME or AE;Pain   OT Treatment/Interventions: Self-care/ADL training;DME and/or AE instruction;Patient/family education    OT Goals(Current goals can be found in the care plan section) Acute Rehab OT Goals Patient Stated Goal: home OT Goal Formulation: With patient Time For Goal Achievement: 06/29/16 Potential to Achieve Goals: Good ADL Goals Pt Will Transfer to Toilet: with supervision;ambulating (comfort height commode) Pt Will Perform Tub/Shower Transfer: Shower transfer;with min guard assist;ambulating;3 in 1  OT Frequency: Min 2X/week   Barriers to D/C:             Co-evaluation              End of Session    Activity Tolerance: Patient limited by pain Patient left: in bed;with call bell/phone within reach;with family/visitor present   Time: [id number] OT Time Calculation (min): 10 min Charges:  OT General Charges $OT Visit: 1 Procedure OT Evaluation $OT Eval Low Complexity: 1 Procedure G-Codes:    Xavien Dauphinais 28-Jun-2016, 2:32 PM Lesle Chris, OTR/L [phone] 06-28-16

## 2016-06-22 NOTE — Evaluation (Signed)
Physical Therapy Evaluation Patient Details Name: Evelyn Orr MRN: [id number] DOB: 1945-06-04 Today's Date: 06/22/2016   History of Present Illness  Pt is a 71 year old female s/p R TKA  Clinical Impression  Pt is s/p TKA resulting in the deficits listed below (see PT Problem List).  Pt will benefit from skilled PT to increase their independence and safety with mobility to allow discharge to the venue listed below.  Pt up in recliner upon arrival, just bathed and dressed.  Pt reports 7/10 pain in R knee which did not alleviate so ambulated to tolerance and then returned to supine.  Pt plans to d/c home likely tomorrow with assist from spouse.     Follow Up Recommendations Home health PT (or per surgeon)    Equipment Recommendations  None recommended by PT    Recommendations for Other Services       Precautions / Restrictions Precautions Precautions: Knee Restrictions Other Position/Activity Restrictions: WBAT      Mobility  Bed Mobility Overal bed mobility: Needs Assistance Bed Mobility: Sit to Supine       Sit to supine: Min guard   General bed mobility comments: verbal cues for technique  Transfers Overall transfer level: Needs assistance Equipment used: Rolling walker (2 wheeled) Transfers: Sit to/from Stand Sit to Stand: Min guard         General transfer comment: verbal cues for UE and LE positioning  Ambulation/Gait Ambulation/Gait assistance: Min guard Ambulation Distance (Feet): 60 Feet Assistive device: Rolling walker (2 wheeled) Gait Pattern/deviations: Step-to pattern;Decreased stance time - right;Antalgic     General Gait Details: verbal cues for sequence, RW positioning, step length  Stairs            Wheelchair Mobility    Modified Rankin (Stroke Patients Only)       Balance                                             Pertinent Vitals/Pain Pain Assessment: 0-10 Pain Score: 7  Pain Location: R  knee Pain Descriptors / Indicators: Aching Pain Intervention(s): Limited activity within patient's tolerance;Monitored during session;Repositioned    Home Living Family/patient expects to be discharged to:: Private residence Living Arrangements: Spouse/significant other   Type of Home: House Home Access: Stairs to enter Entrance Stairs-Rails: Right Entrance Stairs-Number of Steps: 3 Home Layout: One level Home Equipment: Environmental consultant - 2 wheels      Prior Function Level of Independence: Independent               Hand Dominance        Extremity/Trunk Assessment        Lower Extremity Assessment Lower Extremity Assessment: RLE deficits/detail RLE Deficits / Details: fair quad contraction, ROM TBA       Communication   Communication: No difficulties  Cognition Arousal/Alertness: Awake/alert Behavior During Therapy: WFL for tasks assessed/performed Overall Cognitive Status: Within Functional Limits for tasks assessed                      General Comments      Exercises     Assessment/Plan    PT Assessment Patient needs continued PT services  PT Problem List Decreased range of motion;Decreased strength;Decreased mobility;Decreased knowledge of use of DME;Pain;Decreased knowledge of precautions          PT Treatment Interventions Gait  training;DME instruction;Therapeutic activities;Functional mobility training;Therapeutic exercise;Patient/family education;Stair training    PT Goals (Current goals can be found in the Care Plan section)  Acute Rehab PT Goals PT Goal Formulation: With patient Time For Goal Achievement: 06/25/16 Potential to Achieve Goals: Good    Frequency 7X/week   Barriers to discharge        Co-evaluation               End of Session   Activity Tolerance: Patient tolerated treatment well Patient left: in bed;with call bell/phone within reach;with family/visitor present           Time: 1052-1106 PT Time  Calculation (min) (ACUTE ONLY): 14 min   Charges:   PT Evaluation $PT Eval Low Complexity: 1 Procedure     PT G Codes:        Garcia Dalzell,[name] 06/22/2016, 1:02 PM [name], PT, DPT 06/22/2016 Pager: [phone]

## 2016-06-22 NOTE — Care Management Note (Signed)
Case Management Note  Patient Details  Name: KIELA SHISLER MRN: [id number] Date of Birth: Jul 11, 1945  Subjective/Objective:                  RIGHT TOTAL KNEE ARTHROPLASTY (Right) Action/Plan: Discharge planning Expected Discharge Date:  06/23/16               Expected Discharge Plan:  Home/Self Care  In-House Referral:     Discharge planning Services  CM Consult  Post Acute Care Choice:  Home Health Choice offered to:  Patient  DME Arranged:  N/A DME Agency:  NA  HH Arranged:  NA HH Agency:  NA  Status of Service:  Completed, signed off  If discussed at [place] of Stay Meetings, dates discussed:    Additional Comments: CM met with pt in room to offer choice but pt states she is going to outpt PT this week.  Pt also states she has all DME needed at home.  No other CM needs were communicated. Dellie Catholic, RN 06/22/2016, 2:30 PM

## 2016-06-22 NOTE — Progress Notes (Signed)
   06/22/16 1531  PT Visit Information  Last PT Received On 06/22/16  Assistance Needed +1  History of Present Illness Pt is a 71 year old female s/p R TKA  Precautions  Precautions Knee  Restrictions  Other Position/Activity Restrictions WBAT  Pain Assessment  Pain Assessment 0-10  Pain Score 4  Pain Location R knee  Pain Descriptors / Indicators Aching;Sore  Pain Intervention(s) Monitored during session;Limited activity within patient's tolerance;Repositioned  Cognition  Arousal/Alertness Awake/alert  Behavior During Therapy WFL for tasks assessed/performed  Overall Cognitive Status Within Functional Limits for tasks assessed  Bed Mobility  Overal bed mobility Needs Assistance  Bed Mobility Supine to Sit;Sit to Supine  Supine to sit Supervision  Sit to supine Supervision  Transfers  Overall transfer level Needs assistance  Equipment used Rolling walker (2 wheeled)  Transfers Sit to/from Stand  Sit to Stand Min guard  General transfer comment verbal cues for UE and LE positioning  Ambulation/Gait  Ambulation/Gait assistance Min guard  Ambulation Distance (Feet) 60 Feet  Assistive device Rolling walker (2 wheeled)  Gait Pattern/deviations Step-to pattern;Decreased stance time - right;Antalgic  General Gait Details verbal cues for sequence, RW positioning, step length  Exercises  Exercises Total Joint  Total Joint Exercises  Ankle Circles/Pumps AROM;Both;10 reps  Quad Sets AROM;Right;10 reps  Short Arc Quad AROM;Right;10 reps  Heel Slides AAROM;Right;10 reps  Hip ABduction/ADduction AROM;Right;10 reps  Straight Leg Raises AROM;Right;10 reps  Goniometric ROM R knee flexion AAROM 45*  PT - End of Session  Activity Tolerance Patient tolerated treatment well  Patient left in bed;with call bell/phone within reach;with family/visitor present  PT - Assessment/Plan  PT Plan Current plan remains appropriate  PT Frequency (ACUTE ONLY) 7X/week  Follow Up Recommendations Home  health PT  PT equipment None recommended by PT  PT Goal Progression  Progress towards PT goals Progressing toward goals  PT Time Calculation  PT Start Time (ACUTE ONLY) 1430  PT Stop Time (ACUTE ONLY) 1448  PT Time Calculation (min) (ACUTE ONLY) 18 min  PT General Charges  $$ ACUTE PT VISIT 1 Procedure  PT Treatments  $Therapeutic Exercise 8-22 mins   [name], PT, DPT 06/22/2016 Pager: [phone]

## 2016-06-22 NOTE — Progress Notes (Signed)
Patient ID: Evelyn Orr, female   DOB: 1946-05-21, 71 y.o.   MRN: [id number] Subjective: 1 Day Post-Op Procedure(s) (LRB): RIGHT TOTAL KNEE ARTHROPLASTY (Right)    Patient reports pain as moderate.  Not much activity yesterday post-operatively.  Trying to get breakfast ordered this am.  Goal at discharge, home discharge  Objective:   VITALS:   Vitals:   06/22/16 0240 06/22/16 0622  BP: 106/66 (!) 145/88  Pulse: 78 73  Resp: 16 16  Temp: 97.6 F (36.4 C) 97.7 F (36.5 C)    Neurovascular intact Incision: dressing C/D/I  LABS  Recent Labs  06/22/16 0426  HGB 12.1  HCT 36.9  WBC 9.7  PLT 326     Recent Labs  06/22/16 0426  NA 138  K 4.3  BUN 10  CREATININE 0.69  GLUCOSE 132*    No results for input(s): LABPT, INR in the last 72 hours.   Assessment/Plan: 1 Day Post-Op Procedure(s) (LRB): RIGHT TOTAL KNEE ARTHROPLASTY (Right)   Advance diet Up with therapy Plan for discharge tomorrow   Plan is for home discharge Will need in-patient PT to help with preliminary strength and mobility training Work on pain control plan Follow fluid intact and output

## 2016-06-22 NOTE — Addendum Note (Signed)
Addendum  created 06/22/16 0754 by Lollie Sails, CRNA   Charge Capture section accepted

## 2016-06-23 DIAGNOSIS — E663 Overweight: Secondary | ICD-10-CM | POA: Diagnosis present

## 2016-06-23 LAB — BASIC METABOLIC PANEL
ANION GAP: 8 (ref 5–15)
BUN: 12 mg/dL (ref 6–20)
CHLORIDE: 103 mmol/L (ref 101–111)
CO2: 26 mmol/L (ref 22–32)
Calcium: 8.9 mg/dL (ref 8.9–10.3)
Creatinine, Ser: 0.91 mg/dL (ref 0.44–1.00)
GFR calc Af Amer: >60 mL/min (ref >60)
Glucose, Bld: 105 mg/dL — ABNORMAL HIGH (ref 65–99)
POTASSIUM: 3.7 mmol/L (ref 3.5–5.1)
SODIUM: 137 mmol/L (ref 135–145)

## 2016-06-23 LAB — CBC
HCT: 34.6 % — ABNORMAL LOW (ref 36.0–46.0)
HEMOGLOBIN: 11.4 g/dL — AB (ref 12.0–15.0)
MCH: 30.5 pg (ref 26.0–34.0)
MCHC: 32.9 g/dL (ref 30.0–36.0)
MCV: 92.5 fL (ref 78.0–100.0)
PLATELETS: 365 10*3/uL (ref 150–400)
RBC: 3.74 MIL/uL — AB (ref 3.87–5.11)
RDW: 14.5 % (ref 11.5–15.5)
WBC: 12.3 10*3/uL — AB (ref 4.0–10.5)

## 2016-06-23 MED ORDER — DOCUSATE SODIUM 100 MG PO CAPS: 100.0000 mg | ORAL_CAPSULE | Freq: Two times a day (BID) | ORAL | 0 refills | Status: DC

## 2016-06-23 MED ORDER — POLYETHYLENE GLYCOL 3350 17 G PO PACK: 17.0000 g | PACK | Freq: Two times a day (BID) | ORAL | 0 refills | Status: DC

## 2016-06-23 MED ORDER — FERROUS SULFATE 325 (65 FE) MG PO TABS: 325.0000 mg | ORAL_TABLET | Freq: Three times a day (TID) | ORAL | 3 refills | Status: DC

## 2016-06-23 NOTE — Progress Notes (Signed)
Physical Therapy Treatment Patient Details Name: Evelyn Orr MRN: [id number] DOB: 05-30-1946 Today's Date: 2016/06/30    History of Present Illness Pt is a 71 year old female s/p R TKA    PT Comments    Pt ambulated in hallway and practiced safe stair technique.  Pt would like PM session prior to d/c today.  Follow Up Recommendations  Home health PT     Equipment Recommendations  None recommended by PT    Recommendations for Other Services       Precautions / Restrictions Precautions Precautions: Knee Restrictions Other Position/Activity Restrictions: WBAT    Mobility  Bed Mobility Overal bed mobility: Needs Assistance Bed Mobility: Supine to Sit;Sit to Supine     Supine to sit: Supervision Sit to supine: Supervision   General bed mobility comments: pt assisted RLE with UEs as needed  Transfers Overall transfer level: Needs assistance Equipment used: Rolling walker (2 wheeled) Transfers: Sit to/from Stand Sit to Stand: Min guard         General transfer comment: verbal cues for UE and LE positioning  Ambulation/Gait Ambulation/Gait assistance: Min guard Ambulation Distance (Feet): 100 Feet Assistive device: Rolling walker (2 wheeled) Gait Pattern/deviations: Step-to pattern;Decreased stance time - right;Antalgic     General Gait Details: verbal cues for sequence, RW positioning, step length   Stairs Stairs: Yes   Stair Management: One rail Right;Sideways;Forwards;Step to pattern Number of Stairs: 2 General stair comments: verbal cues for safety, sequence, technique  Wheelchair Mobility    Modified Rankin (Stroke Patients Only)       Balance                                    Cognition Arousal/Alertness: Awake/alert Behavior During Therapy: WFL for tasks assessed/performed Overall Cognitive Status: Within Functional Limits for tasks assessed                      Exercises      General Comments         Pertinent Vitals/Pain Pain Assessment: 0-10 Pain Score: 4  Pain Location: R knee Pain Descriptors / Indicators: Aching;Sore Pain Intervention(s): Limited activity within patient's tolerance;Repositioned;Monitored during session    Home Living                      Prior Function            PT Goals (current goals can now be found in the care plan section) Progress towards PT goals: Progressing toward goals    Frequency    7X/week      PT Plan Current plan remains appropriate    Co-evaluation             End of Session   Activity Tolerance: Patient tolerated treatment well Patient left: in bed;with call bell/phone within reach;with family/visitor present     Time: [id number] PT Time Calculation (min) (ACUTE ONLY): 10 min  Charges:  $Gait Training: 8-22 mins                    G Codes:      [name],[name] June 30, 2016, 9:24 AM [name], PT, DPT 06/30/16 Pager: [phone]

## 2016-06-23 NOTE — Progress Notes (Signed)
     Subjective: 2 Days Post-Op Procedure(s) (LRB): RIGHT TOTAL KNEE ARTHROPLASTY (Right)   Patient reports pain as mild, pain controlled. A little more pain today as compared to yesterday, most likely from the increased activity with PT.  No events throughout the night.  Ready to be discharged home.   Objective:   VITALS:   Vitals:   06/22/16 2304 06/23/16 0618  BP: (!) 149/79 (!) 141/80  Pulse: 85 94  Resp: 16 16  Temp: 98.3 F (36.8 C) 98.6 F (37 C)    Dorsiflexion/Plantar flexion intact Incision: dressing C/D/I No cellulitis present Compartment soft  LABS  Recent Labs  06/22/16 0426 06/23/16 0422  HGB 12.1 11.4*  HCT 36.9 34.6*  WBC 9.7 12.3*  PLT 326 365     Recent Labs  06/22/16 0426 06/23/16 0422  NA 138 137  K 4.3 3.7  BUN 10 12  CREATININE 0.69 0.91  GLUCOSE 132* 105*     Assessment/Plan: 2 Days Post-Op Procedure(s) (LRB): RIGHT TOTAL KNEE ARTHROPLASTY (Right) Up with therapy Discharge home with OPPT, already scheduled Follow up in 2 weeks at [facility]. Follow up with OLIN,Bayley Hurn D in 2 weeks.  Contact information:  [facility] [address] [place] [id number]    Overweight (BMI 25-29.9) Estimated body mass index is 29.45 kg/m as calculated from the following:   Height as of this encounter: 5\' 2"  (1.575 m).   Weight as of this encounter: 73 kg (161 lb). Patient also counseled that weight may inhibit the healing process Patient counseled that losing weight will help with future health issues          [place] Pugh. Dajon Lazar   PAC  06/23/2016, 8:22 AM

## 2016-06-23 NOTE — Progress Notes (Signed)
Pt now requests a 3n1. Cm notified [facility] DME rep, Brad to please deliver the 3n1 prior to discharge. No other CM needs were communicated.

## 2016-06-23 NOTE — Progress Notes (Signed)
Occupational Therapy Treatment Patient Details Name: Evelyn Orr MRN: [id number] DOB: 08/07/1945 Today's Date: 06/23/2016    History of present illness Pt is a 71 year old female s/p R TKA   OT comments  All education was completed this session  Follow Up Recommendations  No OT follow up    Equipment Recommendations  None recommended by OT    Recommendations for Other Services      Precautions / Restrictions Precautions Precautions: Knee Restrictions Other Position/Activity Restrictions: WBAT       Mobility Bed Mobility         Supine to sit: Supervision Sit to supine: Supervision   General bed mobility comments: pt assisted RLE with UEs as needed  Transfers   Equipment used: Rolling walker (2 wheeled)   Sit to Stand: Supervision         General transfer comment: cues for UE/LE placement    Balance                                   ADL                                   Tub/ Shower Transfer: Walk-in shower;Min guard;Ambulation;Shower seat     General ADL Comments: educated on and practiced shower transfer. Pt reports she got up from comfort height commode without use of bar.  Reinforced safety of pushing up from toilet seat with one hand and not pulling up on RW. Reviewed knee precautions      Vision                     Perception     Praxis      Cognition   Behavior During Therapy: WFL for tasks assessed/performed Overall Cognitive Status: Within Functional Limits for tasks assessed                       Extremity/Trunk Assessment               Exercises     Shoulder Instructions       General Comments      Pertinent Vitals/ Pain       Pain Score: 6  Pain Location: R knee Pain Descriptors / Indicators: Aching;Sore Pain Intervention(s): Limited activity within patient's tolerance;Monitored during session;Premedicated before session;Repositioned;Ice applied  Home Living                                           Prior Functioning/Environment              Frequency           Progress Toward Goals  OT Goals(current goals can now be found in the care plan section)  Progress towards OT goals: Progressing toward goals (no further OT is needed.  Pt verbalizes understanding of all)     Plan      Co-evaluation                 End of Session     Activity Tolerance Patient tolerated treatment well   Patient Left in bed;with call bell/phone within reach   Nurse Communication          Time:  [id number] OT Time Calculation (min): 13 min  Charges: OT General Charges $OT Visit: 1 Procedure OT Treatments $Self Care/Home Management : 8-22 mins  Evelyn Orr 06/23/2016, 8:17 AM  Lesle Chris, OTR/L [phone] 06/23/2016

## 2016-06-23 NOTE — Progress Notes (Signed)
   06/23/16 1400  PT Visit Information  Last PT Received On 06/23/16  Assistance Needed +1  History of Present Illness Pt is a 71 year old female s/p R TKA  Subjective Data  Subjective Pt ambulated again in hallway and performed well.  Pt also performed LE exercises and eager to d/c home asap.  Pt had no further questions and plans to start outpatient therapy tomorrow.  Precautions  Precautions Knee  Restrictions  Other Position/Activity Restrictions WBAT  Pain Assessment  Pain Assessment 0-10  Pain Score 3  Pain Location R knee  Pain Descriptors / Indicators Aching;Sore  Pain Intervention(s) Limited activity within patient's tolerance;Monitored during session;Premedicated before session;Repositioned  Cognition  Arousal/Alertness Awake/alert  Behavior During Therapy WFL for tasks assessed/performed  Overall Cognitive Status Within Functional Limits for tasks assessed  Bed Mobility  Overal bed mobility Needs Assistance  Bed Mobility Supine to Sit;Sit to Supine  Supine to sit Supervision  Sit to supine Supervision  Transfers  Overall transfer level Needs assistance  Equipment used Rolling walker (2 wheeled)  Transfers Sit to/from Stand  Sit to Stand Supervision  Ambulation/Gait  Ambulation/Gait assistance Supervision  Ambulation Distance (Feet) 75 Feet  Assistive device Rolling walker (2 wheeled)  Gait Pattern/deviations Step-through pattern;Decreased stance time - right;Antalgic  General Gait Details verbal cues for sequence, RW positioning, step length  Exercises  Exercises Total Joint  Total Joint Exercises  Ankle Circles/Pumps AROM;Both;10 reps  Quad Sets AROM;Right;10 reps  Short Arc Quad AROM;Right;10 reps  Heel Slides AAROM;Right;10 reps;Seated  Hip ABduction/ADduction AROM;Right;10 reps  Straight Leg Raises AROM;Right;10 reps  PT - End of Session  Activity Tolerance Patient tolerated treatment well  Patient left in bed;with call bell/phone within reach  PT -  Assessment/Plan  PT Plan Current plan remains appropriate  PT Frequency (ACUTE ONLY) 7X/week  Follow Up Recommendations Home health PT  PT equipment None recommended by PT  PT Goal Progression  Progress towards PT goals Progressing toward goals  PT Time Calculation  PT Start Time (ACUTE ONLY) 1353  PT Stop Time (ACUTE ONLY) 1407  PT Time Calculation (min) (ACUTE ONLY) 14 min  PT General Charges  $$ ACUTE PT VISIT 1 Procedure  PT Treatments  $Therapeutic Exercise 8-22 mins   [name], PT, DPT 06/23/2016 Pager: [phone]

## 2016-06-24 DIAGNOSIS — M25661 Stiffness of right knee, not elsewhere classified: Secondary | ICD-10-CM | POA: Diagnosis not present

## 2016-06-26 NOTE — Discharge Summary (Signed)
Physician Discharge Summary  Patient ID: Evelyn Orr [facility] MRN: [id number] DOB/AGE: [id number] 69 y.o.  Admit date: 06/21/2016 Discharge date: 06/23/2016   Procedures:  Procedure(s) (LRB): RIGHT TOTAL KNEE ARTHROPLASTY (Right)  Attending Physician:  Dr. Paralee Cancel   Admission Diagnoses:   Right knee primary OA / pain  Discharge Diagnoses:  Principal Problem:   S/P right TKA Active Problems:   Overweight (BMI 25.0-29.9)  Past Medical History:  Diagnosis Date  . Arthritis   . Breast cancer ([place])   . Breast cancer of upper-outer quadrant of right female breast ([place]) 03/20/2015  . Cough   . Hypothyroidism   . Nasal congestion   . PONV (postoperative nausea and vomiting)   . Rheumatoid arthritis(714.0)   . Seasonal allergies   . Thyroid disease    hypothyroidism    HPI:    Zanaiya W Freundlich, 71 y.o. female, has a history of pain and functional disability in the right knee due to arthritis and has failed non-surgical conservative treatments for greater than 12 weeks to include NSAID's and/or analgesics, corticosteriod injections and activity modification.  Onset of symptoms was gradual, starting 2+ years ago with gradually worsening course since that time. The patient noted prior procedures on the knee to include  arthroscopy on the right knee(s).  Patient currently rates pain in the right knee(s) at 8 out of 10 with activity. Patient has night pain, worsening of pain with activity and weight bearing, pain that interferes with activities of daily living, pain with passive range of motion, crepitus and joint swelling.  Patient has evidence of periarticular osteophytes and joint space narrowing by imaging studies.  There is no active infection.  Risks, benefits and expectations were discussed with the patient.  Risks including but not limited to the risk of anesthesia, blood clots, nerve damage, blood vessel damage, failure of the prosthesis, infection and up to and including  death.  Patient understand the risks, benefits and expectations and wishes to proceed with surgery.   PCP: Maximino Greenland, MD   Discharged Condition: good  Hospital Course:  Patient underwent the above stated procedure on 06/21/2016. Patient tolerated the procedure well and brought to the recovery room in good condition and subsequently to the floor.  POD #1 BP: 145/88 ; Pulse: 73 ; Temp: 97.7 F (36.5 C) ; Resp: 16 Patient reports pain as moderate.  Not much activity yesterday post-operatively.  Trying to get breakfast ordered this am.  Goal at discharge, home discharge. Dorsiflexion/plantar flexion intact, incision: dressing C/D/I, no cellulitis present and compartment soft.   LABS  Basename    HGB     12.1  HCT     36.9   POD #2  BP: 141/80 ; Pulse: 94 ; Temp: 98.6 F (37 C) ; Resp: 16 Patient reports pain as mild, pain controlled. A little more pain today as compared to yesterday, most likely from the increased activity with PT.  No events throughout the night.  Ready to be discharged home.  Dorsiflexion/plantar flexion intact, incision: dressing C/D/I, no cellulitis present and compartment soft.   LABS  Basename    HGB     11.4  HCT     34.6     Discharge Exam: General appearance: alert, cooperative and no distress Extremities: Homans sign is negative, no sign of DVT, no edema, redness or tenderness in the calves or thighs and no ulcers, gangrene or trophic changes  Disposition: Home with follow up in 2 weeks   Follow-up  Information    Mauri Pole, MD. Schedule an appointment as soon as possible for a visit in 2 week(s).   Specialty:  Orthopedic Surgery Contact information: [address] [place] [address] [id number]           Discharge Instructions    Call MD / Call 911    Complete by:  As directed    If you experience chest pain or shortness of breath, CALL 911 and be transported to the hospital emergency room.  If you develope a  fever above 101 F, pus (white drainage) or increased drainage or redness at the wound, or calf pain, call your surgeon's office.   Change dressing    Complete by:  As directed    Maintain surgical dressing until follow up in the clinic. If the edges start to pull up, may reinforce with tape. If the dressing is no longer working, may remove and cover with gauze and tape, but must keep the area dry and clean.  Call with any questions or concerns.   Constipation Prevention    Complete by:  As directed    Drink plenty of fluids.  Prune juice may be helpful.  You may use a stool softener, such as Colace (over the counter) 100 mg twice a day.  Use MiraLax (over the counter) for constipation as needed.   Diet - low sodium heart healthy    Complete by:  As directed    Discharge instructions    Complete by:  As directed    Maintain surgical dressing until follow up in the clinic. If the edges start to pull up, may reinforce with tape. If the dressing is no longer working, may remove and cover with gauze and tape, but must keep the area dry and clean.  Follow [facility]. Call with any questions or concerns.   Increase activity slowly as tolerated    Complete by:  As directed    Weight bearing as tolerated with assist device (walker, cane, etc) as directed, use it as long as suggested by your surgeon or therapist, typically at least 4-6 weeks.   TED hose    Complete by:  As directed    Use stockings (TED hose) for 2 weeks on both leg(s).  You may remove them at night for sleeping.      Allergies as of 06/23/2016   No Known Allergies     Medication List    STOP taking these medications   methotrexate 2.5 MG tablet Commonly known as:  RHEUMATREX   naproxen sodium 220 MG tablet Commonly known as:  ANAPROX     TAKE these medications   anastrozole 1 MG tablet Commonly known as:  ARIMIDEX Take 1 tablet (1 mg total) by mouth daily.   aspirin 81 MG chewable  tablet Chew 1 tablet (81 mg total) by mouth 2 (two) times daily. Take for 4 weeks, then resume regular dose.   Azelastine HCl 0.15 % Soln 1 SPRAY EACH NOSTRIL DAILY AS NEEDED FOR NASAL CONGESTION   benzonatate 200 MG capsule Commonly known as:  TESSALON TAKE 1 CAPSULE BY ORAL ROUTE 2 TIMES EVERY DAY AS NEEDED FOR COUGH   celecoxib 200 MG capsule Commonly known as:  CELEBREX Take 1 capsule (200 mg total) by mouth 2 (two) times daily.   cetirizine 10 MG tablet Commonly known as:  ZYRTEC Take 10 mg by mouth daily as needed for allergies.   docusate sodium 100 MG  capsule Commonly known as:  COLACE Take 1 capsule (100 mg total) by mouth 2 (two) times daily.   ferrous sulfate 325 (65 FE) MG tablet Take 1 tablet (325 mg total) by mouth 3 (three) times daily after meals.   HYDROcodone-acetaminophen 7.5-325 MG tablet Commonly known as:  NORCO Take 1-2 tablets by mouth every 4 (four) hours as needed for moderate pain.   levothyroxine 88 MCG tablet Commonly known as:  SYNTHROID, LEVOTHROID Take 88 mcg by mouth daily.   methocarbamol 500 MG tablet Commonly known as:  ROBAXIN Take 1 tablet (500 mg total) by mouth every 6 (six) hours as needed for muscle spasms.   polyethylene glycol packet Commonly known as:  MIRALAX / GLYCOLAX Take 17 g by mouth 2 (two) times daily.   Vitamin D-3 5000 units Tabs Take 5,000 Units by mouth daily.        Signed: [place] Pugh. Alessander Sikorski   PA-C  06/26/2016, 9:56 AM

## 2016-06-28 DIAGNOSIS — M25661 Stiffness of right knee, not elsewhere classified: Secondary | ICD-10-CM | POA: Diagnosis not present

## 2016-06-30 DIAGNOSIS — M25661 Stiffness of right knee, not elsewhere classified: Secondary | ICD-10-CM | POA: Diagnosis not present

## 2016-07-02 DIAGNOSIS — M25661 Stiffness of right knee, not elsewhere classified: Secondary | ICD-10-CM | POA: Diagnosis not present

## 2016-07-05 DIAGNOSIS — M25661 Stiffness of right knee, not elsewhere classified: Secondary | ICD-10-CM | POA: Diagnosis not present

## 2016-07-07 DIAGNOSIS — Z96651 Presence of right artificial knee joint: Secondary | ICD-10-CM | POA: Diagnosis not present

## 2016-07-07 DIAGNOSIS — M25661 Stiffness of right knee, not elsewhere classified: Secondary | ICD-10-CM | POA: Diagnosis not present

## 2016-07-07 DIAGNOSIS — Z471 Aftercare following joint replacement surgery: Secondary | ICD-10-CM | POA: Diagnosis not present

## 2016-07-09 DIAGNOSIS — M25661 Stiffness of right knee, not elsewhere classified: Secondary | ICD-10-CM | POA: Diagnosis not present

## 2016-07-13 DIAGNOSIS — M25661 Stiffness of right knee, not elsewhere classified: Secondary | ICD-10-CM | POA: Diagnosis not present

## 2016-07-15 DIAGNOSIS — M25661 Stiffness of right knee, not elsewhere classified: Secondary | ICD-10-CM | POA: Diagnosis not present

## 2016-07-20 DIAGNOSIS — M25661 Stiffness of right knee, not elsewhere classified: Secondary | ICD-10-CM | POA: Diagnosis not present

## 2016-07-22 DIAGNOSIS — M25661 Stiffness of right knee, not elsewhere classified: Secondary | ICD-10-CM | POA: Diagnosis not present

## 2016-07-23 DIAGNOSIS — M542 Cervicalgia: Secondary | ICD-10-CM | POA: Diagnosis not present

## 2016-07-23 DIAGNOSIS — K219 Gastro-esophageal reflux disease without esophagitis: Secondary | ICD-10-CM | POA: Diagnosis not present

## 2016-07-27 DIAGNOSIS — M25661 Stiffness of right knee, not elsewhere classified: Secondary | ICD-10-CM | POA: Diagnosis not present

## 2016-07-29 DIAGNOSIS — M25661 Stiffness of right knee, not elsewhere classified: Secondary | ICD-10-CM | POA: Diagnosis not present

## 2016-07-29 DIAGNOSIS — H40023 Open angle with borderline findings, high risk, bilateral: Secondary | ICD-10-CM | POA: Diagnosis not present

## 2016-07-29 DIAGNOSIS — H35033 Hypertensive retinopathy, bilateral: Secondary | ICD-10-CM | POA: Diagnosis not present

## 2016-07-29 DIAGNOSIS — H35363 Drusen (degenerative) of macula, bilateral: Secondary | ICD-10-CM | POA: Diagnosis not present

## 2016-07-29 DIAGNOSIS — Z961 Presence of intraocular lens: Secondary | ICD-10-CM | POA: Diagnosis not present

## 2016-08-03 DIAGNOSIS — M25661 Stiffness of right knee, not elsewhere classified: Secondary | ICD-10-CM | POA: Diagnosis not present

## 2016-08-03 DIAGNOSIS — Z96651 Presence of right artificial knee joint: Secondary | ICD-10-CM | POA: Diagnosis not present

## 2016-08-03 DIAGNOSIS — Z471 Aftercare following joint replacement surgery: Secondary | ICD-10-CM | POA: Diagnosis not present

## 2016-08-18 ENCOUNTER — Other Ambulatory Visit: Payer: Self-pay | Admitting: *Deleted

## 2016-08-18 MED ORDER — ANASTROZOLE 1 MG PO TABS: 1.0000 mg | ORAL_TABLET | Freq: Every day | ORAL | 3 refills | Status: DC

## 2016-10-14 ENCOUNTER — Ambulatory Visit (INDEPENDENT_AMBULATORY_CARE_PROVIDER_SITE_OTHER): Payer: Medicare Other | Admitting: Podiatry

## 2016-10-14 ENCOUNTER — Encounter: Payer: Self-pay | Admitting: Podiatry

## 2016-10-14 DIAGNOSIS — M7742 Metatarsalgia, left foot: Secondary | ICD-10-CM | POA: Diagnosis not present

## 2016-10-14 DIAGNOSIS — M722 Plantar fascial fibromatosis: Secondary | ICD-10-CM | POA: Diagnosis not present

## 2016-10-14 DIAGNOSIS — M21962 Unspecified acquired deformity of left lower leg: Secondary | ICD-10-CM

## 2016-10-14 NOTE — Progress Notes (Signed)
Subjective: 71 year old female presents complaining of pain under the ball of left foot duration of a couple of weeks.  Also having some pain under right heel.   Objective: Dermatologic: Pre ulcerative callus under 5th MPJ left foot. Hypermobile first and 5th ray right. No edema or erythema at the right heel. Positive of mild erythema under the 5th MPJ right. Subjective pain at plantar aspect right heel.   Assessment: Plantar fasciitis right heel. Plantar flexed 5th metatarsal left. Metatarsalgia left 5th MPJ.                Pre ulcerative callus with erythema at base 5th MPJ left.  Plan: Reviewed all findings and available options, injection, new orthotics, NSAIA. Debrided lesion and padded. Patient is to keep the pad for a week. Patient wish to return for injection on right heel.

## 2016-10-14 NOTE — Patient Instructions (Signed)
Seen for pain under the ball of left foot. Noted of thick callus with bruise under the callus. Lesion debrided and padded. Keep the pad for a week. Return as needed.

## 2016-10-18 ENCOUNTER — Ambulatory Visit: Payer: BC Managed Care – PPO | Admitting: Podiatry

## 2016-10-20 ENCOUNTER — Ambulatory Visit: Payer: BC Managed Care – PPO | Admitting: Podiatry

## 2016-11-11 ENCOUNTER — Other Ambulatory Visit: Payer: Self-pay

## 2016-11-11 ENCOUNTER — Encounter: Payer: Self-pay | Admitting: Hematology

## 2016-11-13 NOTE — Progress Notes (Signed)
This encounter was created in error - please disregard.

## 2016-11-25 DIAGNOSIS — Z13820 Encounter for screening for osteoporosis: Secondary | ICD-10-CM | POA: Diagnosis not present

## 2016-11-25 DIAGNOSIS — M0589 Other rheumatoid arthritis with rheumatoid factor of multiple sites: Secondary | ICD-10-CM | POA: Diagnosis not present

## 2016-11-25 DIAGNOSIS — Z79899 Other long term (current) drug therapy: Secondary | ICD-10-CM | POA: Diagnosis not present

## 2016-12-13 NOTE — Progress Notes (Signed)
[place]  [phone] [phone]  Clinic follow Up Note   Patient Care Team: [name], MD as PCP - General (Internal Medicine) Earnstine Regal, PA-C as Physician Assistant (Obstetrics and Gynecology) Rolm Bookbinder, MD as Consulting Physician (General Surgery) Truitt Merle, MD as Consulting Physician (Hematology) Thea Silversmith, MD (Inactive) as Consulting Physician (Radiation Oncology) Mauro Kaufmann, RN as Registered Nurse Rockwell Germany, RN as Registered Nurse Sylvan Cheese, NP as Nurse Practitioner (Hematology and Oncology) 12/16/2016  CHIEF COMPLAINTS:  Follow up right breast DCIS  Oncology History   Breast cancer of upper-outer quadrant of right female breast [facility])   Staging form: Breast, AJCC 7th Edition     Clinical stage from 03/26/2015: Stage 0 (Tis (DCIS), N0, M0) - Unsigned       Breast cancer of upper-outer quadrant of right female breast ([place])   03/13/2015 Mammogram    7 mm oval mass in the right breast is suspicious for malignancy.      03/20/2015 Initial Biopsy    Breast, right, needle core biopsy DUCTAL CARCINOMA IN SITU IN PAPILLOMA, LOW GRADE      03/20/2015 Receptors her2    Estrogen Receptor: 95%, POSITIVE, STRONG STAINING INTENSITY Progesterone Receptor: 95%, POSITIVE, STRONG STAINING INTENSITY      03/20/2015 Clinical Stage    Stage 0: Tis N0      04/09/2015 Surgery    Right breast lumpectomy      04/09/2015 Pathology Results    Right breast lumpectomy showed low-grade DCIS, margins were negative. Fibrocystic changes with adenosis.      04/09/2015 Pathologic Stage    Stage 0: Tis N0      04/11/2015 Procedure    Breast/Ovarian panel (GeneDX) revealed variant of uncertain significance called "c.704C>A (p.Thr235Lys; otherwise negative at ATM, BARD1, BRCA1, BRCA2, BRIP1, CDH1, CHEK2, FANCC, MLH1, MSH2, MSH6, NBN, PALB2, PMS2, PTEN, RAD51C, RAD51D, TP53, and XRCC2.       06/20/2015  -  Anti-estrogen oral therapy    anastrozole 1 mg once daily      08/21/2015 Survivorship    Survivorship care plan completed and copy given to patient       HISTORY OF PRESENTING ILLNESS:  Evelyn Orr 71 y.o. female is here because of her newly diagnosed right breast DCIS. She presents to our multidisciplinary breast clinic today with her husband and other family members.  This was discovered by screening mammogram. She did not have any palpable breast or axillary masses. She denies any new symptoms. She feels well, no complains, she is physically active, she exercises 4 time a week, lives with her husband.  She had bilateral breast reduction in 2003. She has multiple family members who had history of breast cancer.  CURRENT THERAPY: Anastrozole 1 mg once daily, started on 06/20/2015  INTERIM HISTORY Ms. Wigington returns for follow-up. She is doing very well overall and denies any changes since our last visit.She did have knee replacement 1/22 which helped her pain. She is tolerating Anastrozole well. She denies any new pains or concerns.   MEDICAL HISTORY:  Past Medical History:  Diagnosis Date  . Arthritis   . Breast cancer ([place])   . Breast cancer of upper-outer quadrant of right female breast ([place]) 03/20/2015  . Cough   . Hypothyroidism   . Nasal congestion   . PONV (postoperative nausea and vomiting)   . Rheumatoid arthritis(714.0)   . Seasonal allergies   . Thyroid disease    hypothyroidism  SURGICAL HISTORY: Past Surgical History:  Procedure Laterality Date  . BREAST LUMPECTOMY WITH RADIOACTIVE SEED LOCALIZATION Right 04/09/2015   Procedure: BREAST LUMPECTOMY WITH RADIOACTIVE SEED LOCALIZATION;  Surgeon: Rolm Bookbinder, MD;  Location: [place];  Service: General;  Laterality: Right;  . BREAST REDUCTION SURGERY  2001  . CALCANEAL OSTEOTOMY Left 10/11/2013   Procedure: LEFT CALCANEAL OSTEOTOMY;  Surgeon: Wylene Simmer, MD;  Location: [place];  Service: Orthopedics;  Laterality: Left;  . CARPAL TUNNEL RELEASE  2006   rt  . CARPAL TUNNEL RELEASE     left  . CERVICAL FUSION  2009  . COLONOSCOPY     2002,2005,2008 w/[facility]  . GASTROCNEMIUS RECESSION Left 10/11/2013   Procedure: LEFT GASTROC RECESSION; LEFT POSTERIOR TIBIAL TENOLYSIS;  Surgeon: Wylene Simmer, MD;  Location: [place];  Service: Orthopedics;  Laterality: Left;  . KNEE ARTHROSCOPY  2004   right  . POLYPECTOMY    . REMOVAL OF IMPLANT Left 02/14/2014   Procedure: LEFT FOOT REMOVAL OF DEEP IMPLANT;  Surgeon: Wylene Simmer, MD;  Location: [place];  Service: Orthopedics;  Laterality: Left;  . SHOULDER ARTHROSCOPY W/ ROTATOR CUFF REPAIR  2006   right  . THYROIDECTOMY     age 45  . TONSILLECTOMY    . TOTAL KNEE ARTHROPLASTY Right 06/21/2016   Procedure: RIGHT TOTAL KNEE ARTHROPLASTY;  Surgeon: Paralee Cancel, MD;  Location: [facility];  Service: Orthopedics;  Laterality: Right;  . TUBAL LIGATION    . WRIST ARTHRODESIS  2003   left    GYN HISTORY  Menarchal: 14 LMP: 47  Contraceptive: 3-4 years  HRT: no  G2P2: 2 children 41 daughter and 36 son, no breast feeding    SOCIAL HISTORY: Social History   Social History  . Marital status: Married    Spouse name: N/A  . Number of children: N/A  . Years of education: N/A   Occupational History  . Not on file.   Social History Main Topics  . Smoking status: Former Smoker    Packs/day: 0.25    Years: 21.00    Types: Cigarettes    Quit date: 02/13/1984  . Smokeless tobacco: Never Used  . Alcohol use No  . Drug use: No  . Sexual activity: Yes    Birth control/ protection: Post-menopausal   Other Topics Concern  . Not on file   Social History Narrative  . No narrative on file    FAMILY HISTORY: Family History  Problem Relation Age of Onset  . Heart disease Father   . Heart attack Father   . Hypertension Mother   . Breast cancer Paternal Aunt        dx. 7  or younger  . Colon cancer Paternal Uncle   . Prostate cancer Maternal Uncle   . Other Sister        high breast density  . Prostate cancer Brother 38       unknown Gleason  . Prostate cancer Maternal Grandfather        dx. older age  . Colon polyps Son 70       colonoscopy for unspecified reason; found some colon polyps, has not had one since  . Prostate cancer Maternal Uncle   . Prostate cancer Maternal Uncle   . Colon cancer Other   . Cancer Other        MGM's sister  . Breast cancer Cousin        4 maternal  cousin had breast cancer   . Rectal cancer Neg Hx   . Stomach cancer Neg Hx     ALLERGIES:  has No Known Allergies.  MEDICATIONS:  Current Outpatient Prescriptions  Medication Sig Dispense Refill  . anastrozole (ARIMIDEX) 1 MG tablet Take 1 tablet (1 mg total) by mouth daily. 90 tablet 3  . aspirin 81 MG chewable tablet Chew 1 tablet (81 mg total) by mouth 2 (two) times daily. Take for 4 weeks, then resume regular dose. 60 tablet 0  . Azelastine HCl 0.15 % SOLN 1 SPRAY EACH NOSTRIL DAILY AS NEEDED FOR NASAL CONGESTION  1  . benzonatate (TESSALON) 200 MG capsule TAKE 1 CAPSULE BY ORAL ROUTE 2 TIMES EVERY DAY AS NEEDED FOR COUGH  1  . celecoxib (CELEBREX) 200 MG capsule Take 1 capsule (200 mg total) by mouth 2 (two) times daily. 60 capsule 0  . cetirizine (ZYRTEC) 10 MG tablet Take 10 mg by mouth daily as needed for allergies.    . Cholecalciferol (VITAMIN D-3) 5000 units TABS Take 5,000 Units by mouth daily.    Marland Kitchen docusate sodium (COLACE) 100 MG capsule Take 1 capsule (100 mg total) by mouth 2 (two) times daily. 10 capsule 0  . ferrous sulfate 325 (65 FE) MG tablet Take 1 tablet (325 mg total) by mouth 3 (three) times daily after meals.  3  . HYDROcodone-acetaminophen (NORCO) 7.5-325 MG tablet Take 1-2 tablets by mouth every 4 (four) hours as needed for moderate pain. 100 tablet 0  . levothyroxine (SYNTHROID, LEVOTHROID) 88 MCG tablet Take 88 mcg by mouth daily.      .  methocarbamol (ROBAXIN) 500 MG tablet Take 1 tablet (500 mg total) by mouth every 6 (six) hours as needed for muscle spasms. 50 tablet 0  . polyethylene glycol (MIRALAX / GLYCOLAX) packet Take 17 g by mouth 2 (two) times daily. 14 each 0   No current facility-administered medications for this visit.     REVIEW OF SYSTEMS:   Constitutional: Denies fevers, chills or abnormal night sweats Eyes: Denies blurriness of vision, double vision or watery eyes Ears, nose, mouth, throat, and face: Denies mucositis or sore throat Respiratory: Denies cough, dyspnea or wheezes Cardiovascular: Denies palpitation, chest discomfort or lower extremity swelling Gastrointestinal:  Denies nausea, heartburn or change in bowel habits Skin: Denies abnormal skin rashes Lymphatics: Denies new lymphadenopathy or easy bruising Neurological:Denies numbness, tingling or new weaknesses Behavioral/Psych: Mood is stable, no new changes  All other systems were reviewed with the patient and are negative.  PHYSICAL EXAMINATION:  ECOG PERFORMANCE STATUS: 0 - Asymptomatic  Vitals:   12/16/16 1334  BP: 122/70  Pulse: 79  Resp: 18  Temp: 98.4 F (36.9 C)   Filed Weights   12/16/16 1334  Weight: 164 lb 12.8 oz (74.8 kg)    GENERAL:alert, no distress and comfortable SKIN: skin color, texture, turgor are normal, no rashes or significant lesions EYES: normal, conjunctiva are pink and non-injected, sclera clear OROPHARYNX:no exudate, no erythema and lips, buccal mucosa, and tongue normal  NECK: supple, thyroid normal size, non-tender, without nodularity LYMPH:  no palpable lymphadenopathy in the cervical, axillary or inguinal LUNGS: clear to auscultation and percussion with normal breathing effort HEART: regular rate & rhythm and no murmurs and no lower extremity edema ABDOMEN:abdomen soft, non-tender and normal bowel sounds Musculoskeletal:no cyanosis of digits and no clubbing  PSYCH: alert & oriented x 3 with fluent  speech NEURO: no focal motor/sensory deficits Breasts: Breast inspection showed them to  be symmetrical with no nipple discharge.  The incision in the right breast is healing very well. Palpation of both breasts and axilla revealed no obvious mass that I could appreciate.   LABORATORY DATA:  I have reviewed the data as listed Lab Results  Component Value Date   WBC 5.6 12/16/2016   HGB 12.6 12/16/2016   HCT 38.2 12/16/2016   MCV 93.4 12/16/2016   PLT 317 12/16/2016    Recent Labs  06/15/16 0933 06/22/16 0426 06/23/16 0422 12/16/16 1245  NA 140 138 137 139  K 4.2 4.3 3.7 3.9  CL 102 105 103  --   CO2 30 24 26 26   GLUCOSE 84 132* 105* 83  BUN 9 10 12  9.3  CREATININE 0.62 0.69 0.91 0.9  CALCIUM 9.5 9.2 8.9 10.0  GFRNONAA >60 >60 >60  --   GFRAA >60 >60 >60  --   PROT  --   --   --  8.3  ALBUMIN  --   --   --  3.8  AST  --   --   --  27  ALT  --   --   --  15  ALKPHOS  --   --   --  116  BILITOT  --   --   --  0.44    Pathology report  Diagnosis 04/09/2015 1. Breast, lumpectomy, right - COMPLEX SCLEROSING LESION SHOWING FIBROCYSTIC CHANGES WITH ADENOSIS, USUAL DUCTAL HYPERPLASIA AND LOW GRADE DUCTAL CARCINOMA IN SITU. - MARGINS ARE NEGATIVE. - SEE ONCOLOGY TEMPLATE. 2. Breast, excision, right medial margin - COMPLEX SCLEROSING LESION WITH FIBROCYSTIC CHANGES. - NO ATYPIA, HYPERPLASIA, OR MALIGNANCY IDENTIFIED. Marland Kitchen 3. Breast, excision, right superior margin - COMPLEX SCLEROSING LESION SHOWING FIBROCYSTIC CHANGES WITH ADENOSIS AND USUAL DUCTAL HYPERPLASIA. - NO ATYPIA OR MALIGNANCY IDENTIFIED. Microscopic Comment 1. BREAST, IN SITU CARCINOMA Specimen, including laterality: Right partial breast with additional right medial and right superior margins. Procedure (include lymph node sampling sentinel-non-sentinel): Right breast lumpectomy with additional right medial and right superior margin excisions. Grade of carcinoma: Low grade. Necrosis: No. Estimated tumor size:  (glass slide measurement): Largest residual focus of ductal carcinoma in situ measures 0.3 cm. Treatment effect: Not applicable. Distance to closest margin: Ductal carcinoma in situ is at least 0.4 cm from all margins (closest to medial margin). Breast prognostic profile: Performed on previous case, [phone]. Estrogen receptor: 95%, positive. Progesterone receptor: 95%, positive. Lymph nodes: No lymph nodes received. TNM: pTis, pNX.   RADIOGRAPHIC STUDIES: I have personally reviewed the radiological images as listed and agreed with the findings in the report.  DEXA 05/29/2015: OSTEOPENIA 10 year risk of fracture: major osteoprotic 5.9%, hip 0.4%  ASSESSMENT & PLAN:  71 y.o. postmenopausal woman, presented with screening discovered DCIS.  1. Right breast DCIS, low grade, ER and PR strongly positive - I discussed her surgical pathology result with her in details.  It showed low-grade DCIS, no evidence of invasive carcinoma. Her surgical margins were negative. -Her DCIS is cured by complete surgical resection. Any form of adjuvant therapy is preventive. -adjuvant irradiation was not recommended. -she is currently on anastrozole to prevent future breast cancer. Tolerating well, we'll continue,  plan for a total of 5 years -We discussed breast cancer surveillance after she completes treatment, Including annual mammogram, monthly self breast exam, and breast exam by a physician every 6-12 months. - Labs reviewed and all in normal ranges, she is clinically doing well. - She is due for mammogram and bone density  scan. I will order today.  2. Osteopenia -Her bone density scan in December 2016 showed osteopenia -I encouraged her to take calcium and vitamin D. Her risk of fracture is not high, no need for biphosphonate.  3. Hyperthyroidism and rheumatoid arthritis -She will continue follow-up with her primary care physician. -She is on weekly methotrexate, and her rheumatoid arthritis  has been well-controlled.  Plan -continue Anastrozole  - order mammogram in 02/2017 and bone density scan in 04/2017 at [facility] -lab and f/u in 6 months    All questions were answered. The patient knows to call the clinic with any problems, questions or concerns. I spent 15 minutes counseling the patient face to face. The total time spent in the appointment was 20 minutes and more than 50% was on counseling.  This document serves as a record of services personally performed by Truitt Merle, MD. It was created on her behalf by Brandt Loosen, a trained medical scribe. The creation of this record is based on the scribe's personal observations and the provider's statements to them. This document has been checked and approved by the attending provider.     Truitt Merle, MD 12/16/2016 1:39 PM

## 2016-12-16 ENCOUNTER — Ambulatory Visit (HOSPITAL_BASED_OUTPATIENT_CLINIC_OR_DEPARTMENT_OTHER): Payer: Medicare Other | Admitting: Hematology

## 2016-12-16 ENCOUNTER — Other Ambulatory Visit (HOSPITAL_BASED_OUTPATIENT_CLINIC_OR_DEPARTMENT_OTHER): Payer: BC Managed Care – PPO

## 2016-12-16 VITALS — BP 122/70 | HR 79 | Temp 98.4°F | Resp 18 | Ht 62.0 in | Wt 164.8 lb

## 2016-12-16 DIAGNOSIS — Z170 Estrogen receptor positive status [ER+]: Principal | ICD-10-CM

## 2016-12-16 DIAGNOSIS — C50411 Malignant neoplasm of upper-outer quadrant of right female breast: Secondary | ICD-10-CM | POA: Diagnosis not present

## 2016-12-16 DIAGNOSIS — M8580 Other specified disorders of bone density and structure, unspecified site: Secondary | ICD-10-CM

## 2016-12-16 DIAGNOSIS — D0511 Intraductal carcinoma in situ of right breast: Secondary | ICD-10-CM

## 2016-12-16 DIAGNOSIS — R946 Abnormal results of thyroid function studies: Secondary | ICD-10-CM | POA: Diagnosis not present

## 2016-12-16 DIAGNOSIS — E2839 Other primary ovarian failure: Secondary | ICD-10-CM

## 2016-12-16 DIAGNOSIS — Z79811 Long term (current) use of aromatase inhibitors: Secondary | ICD-10-CM | POA: Diagnosis not present

## 2016-12-16 LAB — CBC WITH DIFFERENTIAL/PLATELET
BASO%: 0.9 % (ref 0.0–2.0)
BASOS ABS: 0 10*3/uL (ref 0.0–0.1)
EOS%: 1.9 % (ref 0.0–7.0)
Eosinophils Absolute: 0.1 10*3/uL (ref 0.0–0.5)
HCT: 38.2 % (ref 34.8–46.6)
HGB: 12.6 g/dL (ref 11.6–15.9)
LYMPH%: 52.1 % — AB (ref 14.0–49.7)
MCH: 30.8 pg (ref 25.1–34.0)
MCHC: 32.9 g/dL (ref 31.5–36.0)
MCV: 93.4 fL (ref 79.5–101.0)
MONO#: 0.2 10*3/uL (ref 0.1–0.9)
MONO%: 4.5 % (ref 0.0–14.0)
NEUT#: 2.3 10*3/uL (ref 1.5–6.5)
NEUT%: 40.6 % (ref 38.4–76.8)
Platelets: 317 10*3/uL (ref 145–400)
RBC: 4.09 10*6/uL (ref 3.70–5.45)
RDW: 16.2 % — ABNORMAL HIGH (ref 11.2–14.5)
WBC: 5.6 10*3/uL (ref 3.9–10.3)
lymph#: 2.9 10*3/uL (ref 0.9–3.3)

## 2016-12-16 LAB — COMPREHENSIVE METABOLIC PANEL
ALT: 15 U/L (ref 0–55)
AST: 27 U/L (ref 5–34)
Albumin: 3.8 g/dL (ref 3.5–5.0)
Alkaline Phosphatase: 116 U/L (ref 40–150)
Anion Gap: 10 mEq/L (ref 3–11)
BUN: 9.3 mg/dL (ref 7.0–26.0)
CHLORIDE: 103 meq/L (ref 98–109)
CO2: 26 mEq/L (ref 22–29)
Calcium: 10 mg/dL (ref 8.4–10.4)
Creatinine: 0.9 mg/dL (ref 0.6–1.1)
EGFR: 76 mL/min/{1.73_m2} — AB (ref >90)
Glucose: 83 mg/dl (ref 70–140)
POTASSIUM: 3.9 meq/L (ref 3.5–5.1)
SODIUM: 139 meq/L (ref 136–145)
Total Bilirubin: 0.44 mg/dL (ref 0.20–1.20)
Total Protein: 8.3 g/dL (ref 6.4–8.3)

## 2016-12-19 ENCOUNTER — Encounter: Payer: Self-pay | Admitting: Hematology

## 2017-01-17 DIAGNOSIS — R05 Cough: Secondary | ICD-10-CM | POA: Diagnosis not present

## 2017-01-17 DIAGNOSIS — R7309 Other abnormal glucose: Secondary | ICD-10-CM | POA: Diagnosis not present

## 2017-01-17 DIAGNOSIS — Z0000 Encounter for general adult medical examination without abnormal findings: Secondary | ICD-10-CM | POA: Diagnosis not present

## 2017-01-17 DIAGNOSIS — E2839 Other primary ovarian failure: Secondary | ICD-10-CM | POA: Diagnosis not present

## 2017-01-17 DIAGNOSIS — E559 Vitamin D deficiency, unspecified: Secondary | ICD-10-CM | POA: Diagnosis not present

## 2017-01-17 DIAGNOSIS — E039 Hypothyroidism, unspecified: Secondary | ICD-10-CM | POA: Diagnosis not present

## 2017-02-24 DIAGNOSIS — H40023 Open angle with borderline findings, high risk, bilateral: Secondary | ICD-10-CM | POA: Diagnosis not present

## 2017-02-24 DIAGNOSIS — H04123 Dry eye syndrome of bilateral lacrimal glands: Secondary | ICD-10-CM | POA: Diagnosis not present

## 2017-02-24 DIAGNOSIS — E0500 Thyrotoxicosis with diffuse goiter without thyrotoxic crisis or storm: Secondary | ICD-10-CM | POA: Diagnosis not present

## 2017-02-25 DIAGNOSIS — Z79899 Other long term (current) drug therapy: Secondary | ICD-10-CM | POA: Diagnosis not present

## 2017-02-25 DIAGNOSIS — M0589 Other rheumatoid arthritis with rheumatoid factor of multiple sites: Secondary | ICD-10-CM | POA: Diagnosis not present

## 2017-02-25 DIAGNOSIS — Z13820 Encounter for screening for osteoporosis: Secondary | ICD-10-CM | POA: Diagnosis not present

## 2017-03-03 ENCOUNTER — Ambulatory Visit: Payer: BC Managed Care – PPO | Admitting: Podiatry

## 2017-03-03 DIAGNOSIS — R0689 Other abnormalities of breathing: Secondary | ICD-10-CM | POA: Diagnosis not present

## 2017-03-03 DIAGNOSIS — R05 Cough: Secondary | ICD-10-CM | POA: Diagnosis not present

## 2017-03-07 ENCOUNTER — Encounter: Payer: Self-pay | Admitting: Podiatry

## 2017-03-07 ENCOUNTER — Ambulatory Visit (INDEPENDENT_AMBULATORY_CARE_PROVIDER_SITE_OTHER): Payer: Medicare Other | Admitting: Podiatry

## 2017-03-07 DIAGNOSIS — M21962 Unspecified acquired deformity of left lower leg: Secondary | ICD-10-CM

## 2017-03-07 DIAGNOSIS — B351 Tinea unguium: Secondary | ICD-10-CM

## 2017-03-07 DIAGNOSIS — M7742 Metatarsalgia, left foot: Secondary | ICD-10-CM | POA: Diagnosis not present

## 2017-03-07 NOTE — Progress Notes (Signed)
Subjective: 71 y.o. year old female patient presents complaining of painful nails and calluses with weight bearing.  Patient requests toe nails and calluses trimmed. Patient denies any other problem.  Objective: Dermatologic: Thick yellow deformed nails x 10.   Symptomatic plantar callus under 5th MPJ left foot. Vascular: Pedal pulses are all palpable. Orthopedic: Contracted lesser digits  Plantar flexed 5th metatarsal left foot. Neurologic: All epicritic and tactile sensations grossly intact.  Assessment: Dystrophic mycotic nails x 10. Plantar flexed 5th met with plantar callus left foot.  Treatment: All mycotic nails and calluses debrided.  Return in 3 months or as needed.

## 2017-03-07 NOTE — Patient Instructions (Signed)
Seen for hypertrophic calluses and nails. All nails and calluses debrided. Return in 3 months or as needed.

## 2017-03-16 ENCOUNTER — Encounter: Payer: Self-pay | Admitting: Podiatry

## 2017-03-16 ENCOUNTER — Ambulatory Visit (INDEPENDENT_AMBULATORY_CARE_PROVIDER_SITE_OTHER): Payer: Medicare Other | Admitting: Podiatry

## 2017-03-16 DIAGNOSIS — M778 Other enthesopathies, not elsewhere classified: Secondary | ICD-10-CM

## 2017-03-16 DIAGNOSIS — M7751 Other enthesopathy of right foot: Secondary | ICD-10-CM | POA: Diagnosis not present

## 2017-03-16 DIAGNOSIS — M79671 Pain in right foot: Secondary | ICD-10-CM

## 2017-03-16 DIAGNOSIS — M779 Enthesopathy, unspecified: Principal | ICD-10-CM

## 2017-03-16 DIAGNOSIS — M21961 Unspecified acquired deformity of right lower leg: Secondary | ICD-10-CM

## 2017-03-16 NOTE — Progress Notes (Signed)
Subjective: 71 y.o. year old female patient presents complaining of pain under the ball of right foot near 4th and 5th MPJ area duration of 2-3 days. Denies any injury.  Patient is wearing flip flap.   Objective: Dermatologic: No abnormal skin lesions noted. Vascular: Pedal pulses are all palpable. Positive of increased temperature with mild localized edema under the 4th and 5th MPJ area right foot. Orthopedic: Plantar flexed 5th MPJ left. Excess sagittal plane motion of the first ray bilateral. Neurologic: All epicritic and tactile sensations grossly intact. Radiographic examination show no acute changes in affected area of the right foot.  IN AP view short first metatarsal, lateral deviation of 5th digit. Lateral view reveal presence of plantar and posterior heel spur.  Assessment: Capsulitis 4th and 5th MPJ right.  Pain and swelling 4th and 5th MPJ right. Hypermobile first ray with lateral weight shifting right.  Treatment: Reviewed findings and available treatment options. Instructed to change shoes to well supported lace up shoe with custom or OTC orthotics. May take Advil as needed. Patient rejected injection therapy. Return if pain continues.

## 2017-03-16 NOTE — Patient Instructions (Signed)
Seen for pain in right under the ball of lesser metatarsal joints. X-ray show no abnormal findings. Need to have well supported tennis shoes with orthotics, OTC or custom. May benefit from injection. May take Advil as needed. Return if pain continues.

## 2017-03-23 DIAGNOSIS — R928 Other abnormal and inconclusive findings on diagnostic imaging of breast: Secondary | ICD-10-CM | POA: Diagnosis not present

## 2017-03-23 DIAGNOSIS — Z853 Personal history of malignant neoplasm of breast: Secondary | ICD-10-CM | POA: Diagnosis not present

## 2017-03-28 ENCOUNTER — Telehealth: Payer: Self-pay | Admitting: Hematology

## 2017-03-28 NOTE — Telephone Encounter (Signed)
FAXED LABS TO DR [name]

## 2017-04-19 DIAGNOSIS — N952 Postmenopausal atrophic vaginitis: Secondary | ICD-10-CM | POA: Diagnosis not present

## 2017-04-19 DIAGNOSIS — M859 Disorder of bone density and structure, unspecified: Secondary | ICD-10-CM | POA: Diagnosis not present

## 2017-04-19 DIAGNOSIS — Z6829 Body mass index (BMI) 29.0-29.9, adult: Secondary | ICD-10-CM | POA: Diagnosis not present

## 2017-04-19 DIAGNOSIS — E039 Hypothyroidism, unspecified: Secondary | ICD-10-CM | POA: Diagnosis not present

## 2017-04-19 DIAGNOSIS — Z853 Personal history of malignant neoplasm of breast: Secondary | ICD-10-CM | POA: Diagnosis not present

## 2017-04-19 DIAGNOSIS — Z124 Encounter for screening for malignant neoplasm of cervix: Secondary | ICD-10-CM | POA: Diagnosis not present

## 2017-04-20 DIAGNOSIS — M25561 Pain in right knee: Secondary | ICD-10-CM | POA: Diagnosis not present

## 2017-04-20 DIAGNOSIS — E039 Hypothyroidism, unspecified: Secondary | ICD-10-CM | POA: Diagnosis not present

## 2017-05-19 ENCOUNTER — Ambulatory Visit: Payer: BC Managed Care – PPO | Admitting: Podiatry

## 2017-05-19 ENCOUNTER — Ambulatory Visit (INDEPENDENT_AMBULATORY_CARE_PROVIDER_SITE_OTHER): Payer: Medicare Other | Admitting: Podiatry

## 2017-05-19 DIAGNOSIS — M7741 Metatarsalgia, right foot: Secondary | ICD-10-CM | POA: Diagnosis not present

## 2017-05-19 DIAGNOSIS — M21969 Unspecified acquired deformity of unspecified lower leg: Secondary | ICD-10-CM | POA: Diagnosis not present

## 2017-05-19 DIAGNOSIS — M7742 Metatarsalgia, left foot: Secondary | ICD-10-CM

## 2017-05-19 NOTE — Progress Notes (Signed)
  Subjective: 71 y.o. year old female patient presents complaining of pain under the 5th MPJ area left foot with thick callus formation. Patient request for surgical intervention.  Objective: Dermatologic: Painful plantar callus under 5th MPJ left. Vascular: Pedal pulses are all palpable. Orthopedic: Hypermobile first ray with excess pronation left. Lateral weight shifting due to unstable first ray. Neurologic: All epicritic and tactile sensations grossly intact. Radiographic examination reveal enlarged medial eminence of the first metatarsal head in AP view with dorsally elevated first ray L>R in lateral view.  Assessment: Painful plantar callus sub 5 left. Hypermobile first ray L>R. Lateral weight shifting with increased STJ pronation left.  Treatment: Reviewed clinical findings and available treatment options. All painful calluses debrided. Patient wished to avoid recurring pain and request for definitive solution. As per request, surgical consent form reviewed for Lapidus fusion of the first MCJ left foot.

## 2017-05-20 ENCOUNTER — Encounter: Payer: Self-pay | Admitting: Podiatry

## 2017-05-20 NOTE — Patient Instructions (Signed)
Seen for pain under the 5th MPJ left foot. Reviewed clinical findings and available treatment options. All painful calluses debrided.  As per request, surgical consent form reviewed for Lapidus fusion of the first MCJ left foot.

## 2017-05-27 ENCOUNTER — Telehealth: Payer: Self-pay | Admitting: *Deleted

## 2017-05-27 DIAGNOSIS — Z79899 Other long term (current) drug therapy: Secondary | ICD-10-CM | POA: Diagnosis not present

## 2017-05-27 DIAGNOSIS — M0589 Other rheumatoid arthritis with rheumatoid factor of multiple sites: Secondary | ICD-10-CM | POA: Diagnosis not present

## 2017-05-27 DIAGNOSIS — Z13820 Encounter for screening for osteoporosis: Secondary | ICD-10-CM | POA: Diagnosis not present

## 2017-05-27 NOTE — Telephone Encounter (Signed)
05/27/17 Patient called this morning and said her foot has been hurting since she was in here on 05/19/17. She still has the patch on Dr.Sheard put on. Her foot is throbbing like a toothache. She is scheduled for surgery on the 11th of January but says she doesn't want to do this until her foot feels better. Please advise.

## 2017-05-30 DIAGNOSIS — M8589 Other specified disorders of bone density and structure, multiple sites: Secondary | ICD-10-CM | POA: Diagnosis not present

## 2017-06-05 NOTE — Telephone Encounter (Signed)
Lvm advising appt chgd from 1/17 to 1/15 at 8am.

## 2017-06-13 NOTE — Progress Notes (Signed)
[place]  Telephone:([phone]) [phone] Fax:([phone]) [phone]  Clinic follow Up Note   Patient Care Team: [name], MD as PCP - General (Internal Medicine) Earnstine Regal, PA-C as Physician Assistant (Obstetrics and Gynecology) Rolm Bookbinder, MD as Consulting Physician (General Surgery) Truitt Merle, MD as Consulting Physician (Hematology) Thea Silversmith, MD (Inactive) as Consulting Physician (Radiation Oncology) Mauro Kaufmann, RN as Registered Nurse Rockwell Germany, RN as Registered Nurse Sylvan Cheese, NP as Nurse Practitioner (Hematology and Oncology) 06/14/2017  CHIEF COMPLAINTS:  Follow up right breast DCIS  Oncology History   Breast cancer of upper-outer quadrant of right female breast [facility])   Staging form: Breast, AJCC 7th Edition     Clinical stage from 03/26/2015: Stage 0 (Tis (DCIS), N0, M0) - Unsigned       Breast cancer of upper-outer quadrant of right female breast ([place])   03/13/2015 Mammogram    7 mm oval mass in the right breast is suspicious for malignancy.      03/20/2015 Initial Biopsy    Breast, right, needle core biopsy DUCTAL CARCINOMA IN SITU IN PAPILLOMA, LOW GRADE      03/20/2015 Receptors her2    Estrogen Receptor: 95%, POSITIVE, STRONG STAINING INTENSITY Progesterone Receptor: 95%, POSITIVE, STRONG STAINING INTENSITY      03/20/2015 Clinical Stage    Stage 0: Tis N0      04/09/2015 Surgery    Right breast lumpectomy      04/09/2015 Pathology Results    Right breast lumpectomy showed low-grade DCIS, margins were negative. Fibrocystic changes with adenosis.      04/09/2015 Pathologic Stage    Stage 0: Tis N0      04/11/2015 Procedure    Breast/Ovarian panel (Gene[facility]) revealed variant of uncertain significance called "c.704C>A (p.Thr235Lys; otherwise negative at ATM, BARD1, BRCA1, BRCA2, BRIP1, CDH1, CHEK2, FANCC, MLH1, MSH2, MSH6, NBN, PALB2, PMS2, PTEN, RAD51C, RAD51D, TP53, and XRCC2.       06/20/2015  -  Anti-estrogen oral therapy    anastrozole 1 mg once daily      08/21/2015 Survivorship    Survivorship care plan completed and copy given to patient      03/23/2017 Mammogram    IMPRESSION: No evidence of malignancy       HISTORY OF PRESENTING ILLNESS:  Evelyn Orr 72 y.o. female is here because of her newly diagnosed right breast DCIS. She presents to our multidisciplinary breast clinic today with her husband and other family members.  This was discovered by screening mammogram. She did not have any palpable breast or axillary masses. She denies any new symptoms. She feels well, no complains, she is physically active, she exercises 4 time a week, lives with her husband.  She had bilateral breast reduction in 2003. She has multiple family members who had history of breast cancer.  CURRENT THERAPY: Anastrozole 1 mg once daily, started on 06/20/2015  INTERIM HISTORY [name] returns for follow-up. She is doing very well overall and denies any changes since our last visit. She is tolerating Anastrozole well. She takes a Vitamin D supplement regularly and does not take a Calcium supplement.   Of note since the patient last visit, she has had a mammogram completed on 03/23/17 with results revealing no evidence of malignancy. She had a bone density scan recently at [facility] and is awaiting her results.  On review of systems, pt reports mild hot flashes that are manageable. Pt denies joint pain or any other complaints at this  time. Pertinent positives are listed and detailed within the above HPI.    MEDICAL HISTORY:  Past Medical History:  Diagnosis Date  . Arthritis   . Breast cancer ([place])   . Breast cancer of upper-outer quadrant of right female breast ([place]) 03/20/2015  . Cough   . Hypothyroidism   . Nasal congestion   . PONV (postoperative nausea and vomiting)   . Rheumatoid arthritis(714.0)   . Seasonal allergies   . Thyroid disease    hypothyroidism     SURGICAL HISTORY: Past Surgical History:  Procedure Laterality Date  . BREAST LUMPECTOMY WITH RADIOACTIVE SEED LOCALIZATION Right 04/09/2015   Procedure: BREAST LUMPECTOMY WITH RADIOACTIVE SEED LOCALIZATION;  Surgeon: Rolm Bookbinder, MD;  Location: [place];  Service: General;  Laterality: Right;  . BREAST REDUCTION SURGERY  2001  . CALCANEAL OSTEOTOMY Left 10/11/2013   Procedure: LEFT CALCANEAL OSTEOTOMY;  Surgeon: Wylene Simmer, MD;  Location: [place];  Service: Orthopedics;  Laterality: Left;  . CARPAL TUNNEL RELEASE  2006   rt  . CARPAL TUNNEL RELEASE     left  . CERVICAL FUSION  2009  . COLONOSCOPY     2002,2005,2008 w/[facility]  . GASTROCNEMIUS RECESSION Left 10/11/2013   Procedure: LEFT GASTROC RECESSION; LEFT POSTERIOR TIBIAL TENOLYSIS;  Surgeon: Wylene Simmer, MD;  Location: [place];  Service: Orthopedics;  Laterality: Left;  . KNEE ARTHROSCOPY  2004   right  . POLYPECTOMY    . REMOVAL OF IMPLANT Left 02/14/2014   Procedure: LEFT FOOT REMOVAL OF DEEP IMPLANT;  Surgeon: Wylene Simmer, MD;  Location: [place];  Service: Orthopedics;  Laterality: Left;  . SHOULDER ARTHROSCOPY W/ ROTATOR CUFF REPAIR  2006   right  . THYROIDECTOMY     age 20  . TONSILLECTOMY    . TOTAL KNEE ARTHROPLASTY Right 06/21/2016   Procedure: RIGHT TOTAL KNEE ARTHROPLASTY;  Surgeon: Paralee Cancel, MD;  Location: [facility];  Service: Orthopedics;  Laterality: Right;  . TUBAL LIGATION    . WRIST ARTHRODESIS  2003   left    GYN HISTORY  Menarchal: 14 LMP: 47  Contraceptive: 3-4 years  HRT: no  G2P2: 2 children 54 daughter and 32 son, no breast feeding    SOCIAL HISTORY: Social History   Socioeconomic History  . Marital status: Married    Spouse name: Not on file  . Number of children: Not on file  . Years of education: Not on file  . Highest education level: Not on file  Social Needs  . Financial resource strain: Not on file  .  Food insecurity - worry: Not on file  . Food insecurity - inability: Not on file  . Transportation needs - medical: Not on file  . Transportation needs - non-medical: Not on file  Occupational History  . Not on file  Tobacco Use  . Smoking status: Former Smoker    Packs/day: 0.25    Years: 21.00    Pack years: 5.25    Types: Cigarettes    Last attempt to quit: 02/13/1984    Years since quitting: 33.3  . Smokeless tobacco: Never Used  Substance and Sexual Activity  . Alcohol use: No  . Drug use: No  . Sexual activity: Yes    Birth control/protection: Post-menopausal  Other Topics Concern  . Not on file  Social History Narrative  . Not on file    FAMILY HISTORY: Family History  Problem Relation Age of Onset  . Heart  disease Father   . Heart attack Father   . Hypertension Mother   . Breast cancer Paternal Aunt        dx. 68 or younger  . Colon cancer Paternal Uncle   . Prostate cancer Maternal Uncle   . Other Sister        high breast density  . Prostate cancer Brother 75       unknown Gleason  . Prostate cancer Maternal Grandfather        dx. older age  . Colon polyps Son 25       colonoscopy for unspecified reason; found some colon polyps, has not had one since  . Prostate cancer Maternal Uncle   . Prostate cancer Maternal Uncle   . Colon cancer Other   . Cancer Other        MGM's sister  . Breast cancer Cousin        4 maternal cousin had breast cancer   . Rectal cancer Neg Hx   . Stomach cancer Neg Hx     ALLERGIES:  has No Known Allergies.  MEDICATIONS:  Current Outpatient Medications  Medication Sig Dispense Refill  . anastrozole (ARIMIDEX) 1 MG tablet Take 1 tablet (1 mg total) by mouth daily. 90 tablet 3  . Azelastine HCl 0.15 % SOLN 1 SPRAY EACH NOSTRIL DAILY AS NEEDED FOR NASAL CONGESTION  1  . benzonatate (TESSALON) 200 MG capsule TAKE 1 CAPSULE BY ORAL ROUTE 2 TIMES EVERY DAY AS NEEDED FOR COUGH  1  . cetirizine (ZYRTEC) 10 MG tablet Take 10 mg  by mouth daily as needed for allergies.    . Cholecalciferol (VITAMIN D-3) 5000 units TABS Take 5,000 Units by mouth daily.    Marland Kitchen docusate sodium (COLACE) 100 MG capsule Take 1 capsule (100 mg total) by mouth 2 (two) times daily. 10 capsule 0  . ferrous sulfate 325 (65 FE) MG tablet Take 1 tablet (325 mg total) by mouth 3 (three) times daily after meals.  3  . levothyroxine (SYNTHROID, LEVOTHROID) 88 MCG tablet Take 88 mcg by mouth daily.      . polyethylene glycol (MIRALAX / GLYCOLAX) packet Take 17 g by mouth 2 (two) times daily. 14 each 0   No current facility-administered medications for this visit.     REVIEW OF SYSTEMS:   Constitutional: Denies fevers, chills or abnormal night sweats (+) mild hot flash Eyes: Denies blurriness of vision, double vision or watery eyes Ears, nose, mouth, throat, and face: Denies mucositis or sore throat Respiratory: Denies cough, dyspnea or wheezes Cardiovascular: Denies palpitation, chest discomfort or lower extremity swelling Gastrointestinal:  Denies nausea, heartburn or change in bowel habits Skin: Denies abnormal skin rashes Lymphatics: Denies new lymphadenopathy or easy bruising Neurological:Denies numbness, tingling or new weaknesses Behavioral/Psych: Mood is stable, no new changes  All other systems were reviewed with the patient and are negative.  PHYSICAL EXAMINATION:  ECOG PERFORMANCE STATUS: 0 - Asymptomatic  Vitals:   06/14/17 0829  BP: (!) 148/78  Pulse: 79  Resp: 20  Temp: 98.4 F (36.9 C)  SpO2: 100%   Filed Weights   06/14/17 0829  Weight: 166 lb 11.2 oz (75.6 kg)    GENERAL:alert, no distress and comfortable SKIN: skin color, texture, turgor are normal, no rashes or significant lesions EYES: normal, conjunctiva are pink and non-injected, sclera clear OROPHARYNX:no exudate, no erythema and lips, buccal mucosa, and tongue normal  NECK: supple, thyroid normal size, non-tender, without nodularity LYMPH:  no palpable  lymphadenopathy in the cervical, axillary or inguinal LUNGS: clear to auscultation and percussion with normal breathing effort HEART: regular rate & rhythm and no murmurs and no lower extremity edema ABDOMEN:abdomen soft, non-tender and normal bowel sounds Musculoskeletal:no cyanosis of digits and no clubbing  PSYCH: alert & oriented x 3 with fluent speech NEURO: no focal motor/sensory deficits Breasts: Breast inspection showed them to be symmetrical with no nipple discharge.  The incision in the right breast is healing very well. Palpation of both breasts and axilla revealed no obvious mass that I could appreciate.   LABORATORY DATA:  I have reviewed the data as listed Lab Results  Component Value Date   WBC 6.0 06/14/2017   HGB 12.4 06/14/2017   HCT 37.9 06/14/2017   MCV 94.9 06/14/2017   PLT 317 06/14/2017   Recent Labs    06/22/16 0426 06/23/16 0422 12/16/16 1245 06/14/17 0753  NA 138 137 139 141  K 4.3 3.7 3.9 3.7  CL 105 103  --  105  CO2 24 26 26 27   GLUCOSE 132* 105* 83 83  BUN 10 12 9.3 9  CREATININE 0.69 0.91 0.9 0.81  CALCIUM 9.2 8.9 10.0 9.5  GFRNONAA >60 >60  --  >60  GFRAA >60 >60  --  >60  PROT  --   --  8.3 7.8  ALBUMIN  --   --  3.8 3.7  AST  --   --  27 19  ALT  --   --  15 12  ALKPHOS  --   --  116 107  BILITOT  --   --  0.44 0.5    Pathology report  Diagnosis 04/09/2015 1. Breast, lumpectomy, right - COMPLEX SCLEROSING LESION SHOWING FIBROCYSTIC CHANGES WITH ADENOSIS, USUAL DUCTAL HYPERPLASIA AND LOW GRADE DUCTAL CARCINOMA IN SITU. - MARGINS ARE NEGATIVE. - SEE ONCOLOGY TEMPLATE. 2. Breast, excision, right medial margin - COMPLEX SCLEROSING LESION WITH FIBROCYSTIC CHANGES. - NO ATYPIA, HYPERPLASIA, OR MALIGNANCY IDENTIFIED. Marland Kitchen 3. Breast, excision, right superior margin - COMPLEX SCLEROSING LESION SHOWING FIBROCYSTIC CHANGES WITH ADENOSIS AND USUAL DUCTAL HYPERPLASIA. - NO ATYPIA OR MALIGNANCY IDENTIFIED. Microscopic Comment 1. BREAST, IN  SITU CARCINOMA Specimen, including laterality: Right partial breast with additional right medial and right superior margins. Procedure (include lymph node sampling sentinel-non-sentinel): Right breast lumpectomy with additional right medial and right superior margin excisions. Grade of carcinoma: Low grade. Necrosis: No. Estimated tumor size: (glass slide measurement): Largest residual focus of ductal carcinoma in situ measures 0.3 cm. Treatment effect: Not applicable. Distance to closest margin: Ductal carcinoma in situ is at least 0.4 cm from all margins (closest to medial margin). Breast prognostic profile: Performed on previous case, [phone]. Estrogen receptor: 95%, positive. Progesterone receptor: 95%, positive. Lymph nodes: No lymph nodes received. TNM: pTis, pNX.   RADIOGRAPHIC STUDIES: I have personally reviewed the radiological images as listed and agreed with the findings in the report.  DEXA 05/30/17: T Score = -2.4  Diagnostic Mammogram 03/23/17 IMPRESSION: No evidence of malignancy  DEXA 05/29/2015: OSTEOPENIA 10 year risk of fracture: major osteoprotic 5.9%, hip 0.4%  ASSESSMENT & PLAN:  72 y.o. postmenopausal woman, presented with screening discovered DCIS.  1. Right breast DCIS, low grade, ER and PR strongly positive - I discussed her surgical pathology result with her in details.  It showed low-grade DCIS, no evidence of invasive carcinoma. Her surgical margins were negative. -Her DCIS is cured by complete surgical resection. Any form of adjuvant therapy is preventive. -adjuvant irradiation was not  recommended. -she is currently on anastrozole to prevent future breast cancer. Tolerating well, we'll continue,  plan for a total of 5 years -We discussed breast cancer surveillance after she completes treatment, Including annual mammogram, monthly self breast exam, and breast exam by a physician every 6-12 months. - Labs reviewed and all in normal ranges, she  is clinically doing well.  No clinical concern for breast cancer recurrence. -Mammogram on 03/27/17 revealed no evidence on malignancy.  -She will continue to follow up with Dr. Donne Hazel yearly.   2. Osteopenia -Her bone density scan in December 2016 showed osteopenia -I encouraged her to take calcium and vitamin D. Her risk of fracture is not high, no need for biphosphonate. -DEXA on 05/30/17 had a T score of -2.4, unchanged from previous scan   3. Hyperthyroidism and rheumatoid arthritis -She will continue follow-up with her primary care physician. -She is on weekly methotrexate, and her rheumatoid arthritis has been well-controlled.  Plan -continue Anastrozole for 3 more years. Refilled today -F/u in 6 months with lab, will see her yearly after next visit    All questions were answered. The patient knows to call the clinic with any problems, questions or concerns. I spent 15 minutes counseling the patient face to face. The total time spent in the appointment was 20 minutes and more than 50% was on counseling.  This document serves as a record of services personally performed by Truitt Merle, MD. It was created on her behalf by Theresia Bough, a trained medical scribe. The creation of this record is based on the scribe's personal observations and the provider's statements to them.   .yrs     Truitt Merle, MD 06/14/2017 9:33 AM

## 2017-06-14 ENCOUNTER — Inpatient Hospital Stay (HOSPITAL_BASED_OUTPATIENT_CLINIC_OR_DEPARTMENT_OTHER): Payer: Medicare Other | Admitting: Hematology

## 2017-06-14 ENCOUNTER — Telehealth: Payer: Self-pay | Admitting: Hematology

## 2017-06-14 ENCOUNTER — Telehealth: Payer: Self-pay | Admitting: *Deleted

## 2017-06-14 ENCOUNTER — Encounter: Payer: Self-pay | Admitting: Hematology

## 2017-06-14 ENCOUNTER — Inpatient Hospital Stay: Payer: Medicare Other | Attending: Hematology

## 2017-06-14 VITALS — BP 148/78 | HR 79 | Temp 98.4°F | Resp 20 | Ht 62.0 in | Wt 166.7 lb

## 2017-06-14 DIAGNOSIS — Z8042 Family history of malignant neoplasm of prostate: Secondary | ICD-10-CM | POA: Diagnosis not present

## 2017-06-14 DIAGNOSIS — C50411 Malignant neoplasm of upper-outer quadrant of right female breast: Secondary | ICD-10-CM

## 2017-06-14 DIAGNOSIS — Z803 Family history of malignant neoplasm of breast: Secondary | ICD-10-CM | POA: Diagnosis not present

## 2017-06-14 DIAGNOSIS — M069 Rheumatoid arthritis, unspecified: Secondary | ICD-10-CM

## 2017-06-14 DIAGNOSIS — Z8371 Family history of colonic polyps: Secondary | ICD-10-CM

## 2017-06-14 DIAGNOSIS — M8580 Other specified disorders of bone density and structure, unspecified site: Secondary | ICD-10-CM | POA: Insufficient documentation

## 2017-06-14 DIAGNOSIS — Z800 Family history of malignant neoplasm of digestive organs: Secondary | ICD-10-CM

## 2017-06-14 DIAGNOSIS — D0511 Intraductal carcinoma in situ of right breast: Secondary | ICD-10-CM

## 2017-06-14 DIAGNOSIS — Z79899 Other long term (current) drug therapy: Secondary | ICD-10-CM | POA: Insufficient documentation

## 2017-06-14 DIAGNOSIS — E039 Hypothyroidism, unspecified: Secondary | ICD-10-CM

## 2017-06-14 DIAGNOSIS — Z79811 Long term (current) use of aromatase inhibitors: Secondary | ICD-10-CM | POA: Insufficient documentation

## 2017-06-14 DIAGNOSIS — Z87891 Personal history of nicotine dependence: Secondary | ICD-10-CM | POA: Insufficient documentation

## 2017-06-14 DIAGNOSIS — Z170 Estrogen receptor positive status [ER+]: Secondary | ICD-10-CM | POA: Diagnosis not present

## 2017-06-14 LAB — CBC WITH DIFFERENTIAL/PLATELET
BASOS ABS: 0 10*3/uL (ref 0.0–0.1)
Basophils Relative: 0 %
EOS PCT: 3 %
Eosinophils Absolute: 0.2 10*3/uL (ref 0.0–0.5)
HCT: 37.9 % (ref 34.8–46.6)
HEMOGLOBIN: 12.4 g/dL (ref 11.6–15.9)
LYMPHS PCT: 52 %
Lymphs Abs: 3.1 10*3/uL (ref 0.9–3.3)
MCH: 31 pg (ref 25.1–34.0)
MCHC: 32.6 g/dL (ref 31.5–36.0)
MCV: 94.9 fL (ref 79.5–101.0)
Monocytes Absolute: 0.3 10*3/uL (ref 0.1–0.9)
Monocytes Relative: 5 %
NEUTROS PCT: 40 %
Neutro Abs: 2.4 10*3/uL (ref 1.5–6.5)
PLATELETS: 317 10*3/uL (ref 145–400)
RBC: 3.99 MIL/uL (ref 3.70–5.45)
RDW: 15.7 % (ref 11.2–16.1)
WBC: 6 10*3/uL (ref 3.9–10.3)

## 2017-06-14 LAB — COMPREHENSIVE METABOLIC PANEL
ALT: 12 U/L (ref 0–55)
AST: 19 U/L (ref 5–34)
Albumin: 3.7 g/dL (ref 3.5–5.0)
Alkaline Phosphatase: 107 U/L (ref 40–150)
Anion gap: 9 (ref 3–11)
BUN: 9 mg/dL (ref 7–26)
CHLORIDE: 105 mmol/L (ref 98–109)
CO2: 27 mmol/L (ref 22–29)
CREATININE: 0.81 mg/dL (ref 0.60–1.10)
Calcium: 9.5 mg/dL (ref 8.4–10.4)
GFR calc Af Amer: >60 mL/min (ref >60)
Glucose, Bld: 83 mg/dL (ref 70–140)
Potassium: 3.7 mmol/L (ref 3.3–4.7)
SODIUM: 141 mmol/L (ref 136–145)
Total Bilirubin: 0.5 mg/dL (ref 0.2–1.2)
Total Protein: 7.8 g/dL (ref 6.4–8.3)

## 2017-06-14 MED ORDER — ANASTROZOLE 1 MG PO TABS: 1.0000 mg | ORAL_TABLET | Freq: Every day | ORAL | 3 refills | Status: DC

## 2017-06-14 NOTE — Telephone Encounter (Signed)
Called pt & informed of DEXA scan report.  Per Dr Burr Medico, pt has osteopenia.  Informed pt & instructed to cont. Vit D & make sure she gets calcium in her diet.

## 2017-06-14 NOTE — Telephone Encounter (Signed)
Gave avs and calendar for july °

## 2017-06-16 ENCOUNTER — Ambulatory Visit: Payer: Self-pay | Admitting: Hematology

## 2017-06-16 ENCOUNTER — Other Ambulatory Visit: Payer: Medicare Other

## 2017-06-22 ENCOUNTER — Encounter: Payer: BC Managed Care – PPO | Admitting: Podiatry

## 2017-07-20 DIAGNOSIS — R05 Cough: Secondary | ICD-10-CM | POA: Diagnosis not present

## 2017-07-20 DIAGNOSIS — R7309 Other abnormal glucose: Secondary | ICD-10-CM | POA: Diagnosis not present

## 2017-07-20 DIAGNOSIS — E559 Vitamin D deficiency, unspecified: Secondary | ICD-10-CM | POA: Diagnosis not present

## 2017-07-20 DIAGNOSIS — E039 Hypothyroidism, unspecified: Secondary | ICD-10-CM | POA: Diagnosis not present

## 2017-07-20 DIAGNOSIS — M069 Rheumatoid arthritis, unspecified: Secondary | ICD-10-CM | POA: Diagnosis not present

## 2017-08-22 ENCOUNTER — Institutional Professional Consult (permissible substitution): Payer: Self-pay | Admitting: Internal Medicine

## 2017-08-24 DIAGNOSIS — J029 Acute pharyngitis, unspecified: Secondary | ICD-10-CM | POA: Diagnosis not present

## 2017-08-24 DIAGNOSIS — Z961 Presence of intraocular lens: Secondary | ICD-10-CM | POA: Diagnosis not present

## 2017-08-24 DIAGNOSIS — H9202 Otalgia, left ear: Secondary | ICD-10-CM | POA: Diagnosis not present

## 2017-08-24 DIAGNOSIS — H35363 Drusen (degenerative) of macula, bilateral: Secondary | ICD-10-CM | POA: Diagnosis not present

## 2017-08-24 DIAGNOSIS — H40023 Open angle with borderline findings, high risk, bilateral: Secondary | ICD-10-CM | POA: Diagnosis not present

## 2017-08-24 DIAGNOSIS — H04123 Dry eye syndrome of bilateral lacrimal glands: Secondary | ICD-10-CM | POA: Diagnosis not present

## 2017-08-25 DIAGNOSIS — Z13820 Encounter for screening for osteoporosis: Secondary | ICD-10-CM | POA: Diagnosis not present

## 2017-08-25 DIAGNOSIS — M0589 Other rheumatoid arthritis with rheumatoid factor of multiple sites: Secondary | ICD-10-CM | POA: Diagnosis not present

## 2017-08-25 DIAGNOSIS — Z79899 Other long term (current) drug therapy: Secondary | ICD-10-CM | POA: Diagnosis not present

## 2017-08-29 ENCOUNTER — Institutional Professional Consult (permissible substitution): Payer: Self-pay | Admitting: Internal Medicine

## 2017-09-01 DIAGNOSIS — Z79899 Other long term (current) drug therapy: Secondary | ICD-10-CM | POA: Diagnosis not present

## 2017-09-01 DIAGNOSIS — M0589 Other rheumatoid arthritis with rheumatoid factor of multiple sites: Secondary | ICD-10-CM | POA: Diagnosis not present

## 2017-09-20 ENCOUNTER — Encounter: Payer: Self-pay | Admitting: Podiatry

## 2017-09-20 ENCOUNTER — Ambulatory Visit (INDEPENDENT_AMBULATORY_CARE_PROVIDER_SITE_OTHER): Payer: Medicare Other | Admitting: Podiatry

## 2017-09-20 DIAGNOSIS — M21969 Unspecified acquired deformity of unspecified lower leg: Secondary | ICD-10-CM

## 2017-09-20 DIAGNOSIS — Q828 Other specified congenital malformations of skin: Secondary | ICD-10-CM | POA: Diagnosis not present

## 2017-09-20 NOTE — Progress Notes (Signed)
Patient presents the office with chief complaint of a painful callus on the outside of her left forefoot.  She says this callus has been present for months and is causing pain and discomfort.  She says it is painful walking and wearing her shoes.  She has previously been seen by another podiatrist who has to provided a callus.  She says that she is tired of having her callus to provided and desires more permanent correction.  She presents the office today for an evaluation and treatment of her painful left forefoot.    General Appearance  Alert, conversant and in no acute stress.  Vascular  Dorsalis pedis and posterior tibial  pulses are palpable  bilaterally.  Capillary return is within normal limits  bilaterally. Temperature is within normal limits  bilaterally.  Neurologic  Senn-Weinstein monofilament wire test within normal limits  bilaterally. Muscle power within normal limits bilaterally.  Nails normal nails noted with no evidence of any fungal or bacterial infection.  Orthopedic  No limitations of motion of motion feet .  No crepitus or effusions noted.  HAV  B/L.  Plantar flexed fifth metatarsal left foot.  Skin  normotropic skin  noted bilaterally.  No signs of infections or ulcers noted.  Porokeratosis  Sub 5th metatarsal left foot.  Porokeratosis sub 5th metatarsal left foot.  IE  Debride porokeratosis.   Discussed this condition with this patient.  She is interested in permanent correction and not just debridement of the porokeratotic lesion.  This patient told me she was going to check on her insurance coverage and get back to me'  I will then refer her to one of the doctors performing surgery.   Gardiner Barefoot DPM

## 2017-09-22 ENCOUNTER — Encounter: Payer: Self-pay | Admitting: Internal Medicine

## 2017-09-22 ENCOUNTER — Ambulatory Visit (INDEPENDENT_AMBULATORY_CARE_PROVIDER_SITE_OTHER)
Admission: RE | Admit: 2017-09-22 | Discharge: 2017-09-22 | Disposition: A | Discharge status: Home / Self Care | Payer: Medicare Other | Source: Ambulatory Visit | Attending: Internal Medicine | Admitting: Internal Medicine

## 2017-09-22 ENCOUNTER — Ambulatory Visit (INDEPENDENT_AMBULATORY_CARE_PROVIDER_SITE_OTHER): Payer: Medicare Other | Admitting: Internal Medicine

## 2017-09-22 ENCOUNTER — Other Ambulatory Visit (INDEPENDENT_AMBULATORY_CARE_PROVIDER_SITE_OTHER): Payer: Medicare Other

## 2017-09-22 VITALS — BP 124/76 | HR 92 | Ht 64.0 in | Wt 164.0 lb

## 2017-09-22 DIAGNOSIS — R05 Cough: Secondary | ICD-10-CM

## 2017-09-22 DIAGNOSIS — R058 Other specified cough: Secondary | ICD-10-CM

## 2017-09-22 LAB — CBC WITH DIFFERENTIAL/PLATELET
Basophils Absolute: 0.1 10*3/uL (ref 0.0–0.1)
Basophils Relative: 1.3 % (ref 0.0–3.0)
EOS PCT: 1 % (ref 0.0–5.0)
Eosinophils Absolute: 0.1 10*3/uL (ref 0.0–0.7)
HCT: 39.8 % (ref 36.0–46.0)
Hemoglobin: 13.2 g/dL (ref 12.0–15.0)
Lymphocytes Relative: 54.1 % — ABNORMAL HIGH (ref 12.0–46.0)
Lymphs Abs: 3.3 10*3/uL (ref 0.7–4.0)
MCHC: 33.3 g/dL (ref 30.0–36.0)
MCV: 95 fl (ref 78.0–100.0)
MONOS PCT: 5.6 % (ref 3.0–12.0)
Monocytes Absolute: 0.3 10*3/uL (ref 0.1–1.0)
NEUTROS PCT: 38 % — AB (ref 43.0–77.0)
Neutro Abs: 2.3 10*3/uL (ref 1.4–7.7)
Platelets: 274 10*3/uL (ref 150.0–400.0)
RBC: 4.19 Mil/uL (ref 3.87–5.11)
RDW: 14.7 % (ref 11.5–15.5)
WBC: 6.1 10*3/uL (ref 4.0–10.5)

## 2017-09-22 MED ORDER — FAMOTIDINE 20 MG PO TABS: ORAL_TABLET | ORAL | 11 refills | Status: DC

## 2017-09-22 MED ORDER — BENZONATATE 200 MG PO CAPS: ORAL_CAPSULE | ORAL | 2 refills | Status: DC

## 2017-09-22 MED ORDER — PANTOPRAZOLE SODIUM 40 MG PO TBEC: 40.0000 mg | DELAYED_RELEASE_TABLET | Freq: Every day | ORAL | 2 refills | Status: DC

## 2017-09-22 NOTE — Progress Notes (Signed)
Subjective:     Patient ID: Evelyn Orr, female   DOB: December 01, 1945,    MRN: [id number]  HPI  89 yobf quit smoking 1985 and no sequelae with one episode of cough/ wheezing while working at [employer] ? Early 2000s saw ent ? Who > ? Dx ? Treatment and resolved  and started coughing again around early 2018 indolent onset persistent daily so referred to pulmonary clinic 09/22/2017 by Dr   Johnnye Lana    09/22/2017 1st [facility] office visit/ Wert   Chief Complaint  Patient presents with  . Pulmonary Consult    Referred by Dr. [name]. Pt c/o cough for the past year- occ prod with yellow sputum and worse when she lies down, occ wakes her up.    tessalon helps some/ so does zyrtec never takes both, both work about 75%  Perfume makes it worse Seems worse at hs also but rarely wakes her  MMRC1 = can walk nl pace, flat grade, can't hurry or go uphills or steps s sob  - can walk 3 miles w/out stoppin  RA good shape       Kouffman Reflux v Neurogenic Cough Differentiator Reflux Comments  Do you awaken from a sound sleep coughing violently?                            With trouble breathing? Yes no   Do you have choking episodes when you cannot  Get enough air, gasping for air ?              Yes   Do you usually cough when you lie down into  The bed, or when you just lie down to rest ?                          Yes   Do you usually cough after meals or eating?         sometimes   Do you cough when (or after) you bend over?    No   GERD SCORE     Kouffman Reflux v Neurogenic Cough Differentiator Neurogenic   Do you more-or-less cough all day long? Sporadic    Does change of temperature make you cough? no   Does laughing or chuckling cause you to cough? yes   Do fumes (perfume, automobile fumes, burned  Toast, etc.,) cause you to cough ?      yes   Does speaking, singing, or talking on the phone cause you to cough   ?               yes   Neurogenic/Airway score        No obvious  day to day or daytime variability or assoc excess/ purulent sputum or mucus plugs or hemoptysis or cp or chest tightness, subjective wheeze or overt sinus or hb symptoms. No unusual exposure hx or h/o childhood pna/ asthma or knowledge of premature birth.   Also denies any obvious fluctuation of symptoms with weather or environmental changes or other aggravating or alleviating factors except as outlined above   Current Allergies, Complete Past Medical History, Past Surgical History, Family History, and Social History were reviewed in [employer] record.  ROS  The following are not active complaints unless bolded Hoarseness, sore throat, dysphagia, dental problems, itching, sneezing,  nasal congestion or discharge of excess mucus or purulent secretions, ear ache,  fever, chills, sweats, unintended wt loss or wt gain, classically pleuritic or exertional cp,  orthopnea pnd or arm/hand swelling  or leg swelling, presyncope, palpitations, abdominal pain, anorexia, nausea, vomiting, diarrhea  or change in bowel habits or change in bladder habits, change in stools or change in urine, dysuria, hematuria,  rash, arthralgias, visual complaints, headache, numbness, weakness or ataxia or problems with walking or coordination,  change in mood or  memory.            Current Meds - unable to verify   Medication Sig  . anastrozole (ARIMIDEX) 1 MG tablet Take 1 tablet (1 mg total) by mouth daily.  . Azelastine HCl 0.15 % SOLN 1 SPRAY EACH NOSTRIL DAILY AS NEEDED FOR NASAL CONGESTION  . benzonatate (TESSALON) 200 MG capsule TAKE 1 CAPSULE BY ORAL ROUTE 2 TIMES EVERY DAY AS NEEDED FOR COUGH  . cetirizine (ZYRTEC) 10 MG tablet Take 10 mg by mouth daily as needed for allergies.  . Cholecalciferol (VITAMIN D-3) 5000 units TABS Take 5,000 Units by mouth daily.  Marland Kitchen levothyroxine (SYNTHROID, LEVOTHROID) 88 MCG tablet Take 88 mcg by mouth daily.           Review of Systems     Objective:    Physical Exam    amb bf chewing spearmint gum    . Wt Readings from Last 3 Encounters:  09/22/17 164 lb (74.4 kg)  06/14/17 166 lb 11.2 oz (75.6 kg)  12/16/16 164 lb 12.8 oz (74.8 kg)     Vital signs reviewed - Note on arrival 02 sats  96% on RA    HEENT: nl dentition, turbinates bilaterally, and oropharynx. Nl external ear canals without cough reflex   NECK :  without JVD/Nodes/TM/ nl carotid upstrokes bilaterally   LUNGS: no acc muscle use,  Nl contour chest which is clear to A and P bilaterally without cough on insp or exp maneuvers   CV:  RRR  no s3 or murmur or increase in P2, and no edema   ABD:  soft and nontender with nl inspiratory excursion in the supine position. No bruits or organomegaly appreciated, bowel sounds nl  MS:  Nl gait/ ext warm without deformities, calf tenderness, cyanosis or clubbing No obvious joint restrictions   SKIN: warm and dry without lesions    NEURO:  alert, approp, nl sensorium with  no motor or cerebellar deficits apparent.     CXR PA and Lateral:   09/22/2017 :    I personally reviewed images and agree with radiology impression as follows:   The heart size and mediastinal contours are within normal limits. Both lungs are clear. The visualized skeletal structures are unremarkable.     Labs ordered 09/22/2017  Allergy profile     Assessment:

## 2017-09-22 NOTE — Assessment & Plan Note (Addendum)
Allergy profile 09/22/2017 >  Eos 0.1 /  IgE   - max gerd rx and 1st gen H1 blockers per guidelines  09/22/2017 >>>   Upper airway cough syndrome (previously labeled PNDS),  is so named because it's frequently impossible to sort out how much is  CR/sinusitis with freq throat clearing (which can be related to primary GERD)   vs  causing  secondary (" extra esophageal")  GERD from wide swings in gastric pressure that occur with throat clearing, often  promoting self use of mint and menthol lozenges that reduce the lower esophageal sphincter tone and exacerbate the problem further in a cyclical fashion.   These are the same pts (now being labeled as having "irritable larynx syndrome" by some cough centers) who not infrequently have a history of having failed to tolerate ace inhibitors,  dry powder inhalers or biphosphonates or report having atypical/extraesophageal reflux symptoms that don't respond to standard doses of PPI  and are easily confused as having aecopd or asthma flares by even experienced allergists/ pulmonologists (myself included).   Of the three most common causes of  Sub-acute / recurrent or chronic cough, only one (GERD)  can actually contribute to/ trigger  the other two (asthma and post nasal drip syndrome)  and perpetuate the cylce of cough.  While not intuitively obvious, many patients with chronic low grade reflux do not cough until there is a primary insult that disturbs the protective epithelial barrier and exposes sensitive nerve endings.   This is typically viral but can due to PNDS and  either may apply here.   The point is that once this occurs, it is difficult to eliminate the cycle  using anything but a maximally effective acid suppression regimen at least in the short run, accompanied by an appropriate diet to address non acid GERD and control / eliminate the cough itself for at least 3 days.    Try max rx as above then return in 4 weeks with all meds in hand using a trust but  verify approach to confirm accurate Medication  Reconciliation The principal here is that until we are certain that the  patients are doing what we've asked, it makes no sense to ask them to do more.    Advised: The standardized cough guidelines published in Chest by Lissa Morales in 2006 are still the best available and consist of a multiple step process (up to 12!) , not a single office visit,  and are intended  to address this problem logically,  with an alogrithm dependent on response to empiric treatment at  each progressive step  to determine a specific diagnosis with  minimal addtional testing needed. Therefore if adherence is an issue or can't be accurately verified,  it's very unlikely the standard evaluation and treatment will be successful here.    Furthermore, response to therapy (other than acute cough suppression, which should only be used short term with avoidance of narcotic containing cough syrups if possible), can be a gradual process for which the patient is not likely to  perceive immediate benefit.  Unlike going to an eye doctor where the best perscription is almost always the first one and is immediately effective, this is almost never the case in the management of chronic cough syndromes. Therefore the patient needs to commit up front to consistently adhere to recommendations  for up to 6 weeks of therapy directed at the likely underlying problem(s) before the response can be reasonably evaluated.    Total  time devoted to counseling  > 50 % of initial 60 min office visit:  review case with pt/ discussion of options/alternatives/ personally creating written customized instructions  in presence of pt  then going over those specific  Instructions directly with the pt including how to use all of the meds but in particular covering each new medication in detail and the difference between the maintenance= "automatic" meds and the prns using an action plan format for the latter (If this  problem/symptom => do that organization reading Left to right).  Please see AVS from this visit for a full list of these instructions which I personally wrote for this pt and  are unique to this visit.

## 2017-09-22 NOTE — Patient Instructions (Signed)
Pantoprazole (protonix) 40 mg   Take  30-60 min before first meal of the day and Pepcid (famotidine)  20 mg one @  bedtime until return to office - this is the best way to tell whether stomach acid is contributing to your problem.    GERD (REFLUX)  is an extremely common cause of respiratory symptoms just like yours , many times with no obvious heartburn at all.    It can be treated with medication, but also with lifestyle changes including elevation of the head of your bed (ideally with 6 inch  bed blocks),  Smoking cessation, avoidance of late meals, excessive alcohol, and avoid fatty foods, chocolate, peppermint, colas, red wine, and acidic juices such as orange juice.  NO MINT OR MENTHOL PRODUCTS SO NO COUGH DROPS   USE SUGARLESS CANDY INSTEAD (Jolley ranchers or Stover's or Life Savers) or even ice chips will also do - the key is to swallow to prevent all throat clearing. NO OIL BASED VITAMINS - use powdered substitutes.  For drainage / throat tickle try take CHLORPHENIRAMINE  4 mg - take one every 4 hours as needed - available over the counter- may cause drowsiness so start with just a bedtime dose or two and see how you tolerate it before trying in daytime    Bedtime = one hour before    Continue Tessalon up to every 6-8 hours as needed with the goal to stop all coughing    Please remember to go to the lab and x-ray department downstairs in the basement  for your tests - we will call you with the results when they are available.     See if you can find out who you saw for your throat previously and get the name or note he wrote      Please schedule a follow up office visit in 4 weeks, sooner if needed  with all medications /inhalers/ solutions in hand so we can verify exactly what you are taking. This includes all medications from all doctors and over the counters  - PFTs on return

## 2017-09-23 ENCOUNTER — Encounter: Payer: Self-pay | Admitting: Internal Medicine

## 2017-09-23 DIAGNOSIS — M67911 Unspecified disorder of synovium and tendon, right shoulder: Secondary | ICD-10-CM | POA: Diagnosis not present

## 2017-09-23 DIAGNOSIS — M25511 Pain in right shoulder: Secondary | ICD-10-CM | POA: Diagnosis not present

## 2017-09-23 LAB — RESPIRATORY ALLERGY PROFILE REGION II ~~LOC~~
Allergen, Cedar tree, t12: <0.1 kU/L
Allergen, Cottonwood, t14: <0.1 kU/L
Allergen, D pternoyssinus,d7: 0.1 kU/L — ABNORMAL HIGH
Allergen, Mulberry, t76: <0.1 kU/L
Allergen, Oak,t7: <0.1 kU/L
Allergen, P. notatum, m1: <0.1 kU/L
Bermuda Grass: <0.1 kU/L
CLADOSPORIUM HERBARUM (M2) IGE: <0.1 kU/L
CLASS: 0
CLASS: 0
CLASS: 0
CLASS: 0
CLASS: 0
CLASS: 0
CLASS: 0
Cat Dander: <0.1 kU/L
Class: 0
Class: 0
Class: 0
Class: 0
Class: 0
Class: 0
Class: 0
Class: 0
Class: 0
Class: 0
Class: 0
Class: 0
Class: 0
Class: 0
Class: 0
Class: 0
Class: 0
Cockroach: <0.1 kU/L
Dog Dander: <0.1 kU/L
IgE (Immunoglobulin E), Serum: 47 kU/L (ref <=114)
Rough Pigweed  IgE: <0.1 kU/L
Timothy Grass: <0.1 kU/L

## 2017-09-23 LAB — INTERPRETATION:

## 2017-10-20 ENCOUNTER — Ambulatory Visit (INDEPENDENT_AMBULATORY_CARE_PROVIDER_SITE_OTHER): Payer: Medicare Other | Admitting: Internal Medicine

## 2017-10-20 ENCOUNTER — Encounter: Payer: Self-pay | Admitting: Internal Medicine

## 2017-10-20 VITALS — BP 126/74 | HR 83 | Ht 64.0 in | Wt 162.0 lb

## 2017-10-20 DIAGNOSIS — R058 Other specified cough: Secondary | ICD-10-CM

## 2017-10-20 DIAGNOSIS — R05 Cough: Secondary | ICD-10-CM

## 2017-10-20 NOTE — Patient Instructions (Signed)
Call for follow up appt  if you find you need more tessalon than you do now  But try not to use it at night    For drainage / throat tickle try take CHLORPHENIRAMINE  4 mg - take one up to every 4 hours as needed - available over the counter- may cause drowsiness so start with just a bedtime dose or two and see how you tolerate it before trying in daytime   Follow up here is as needed

## 2017-10-20 NOTE — Progress Notes (Signed)
Subjective:     Patient ID: Evelyn Orr, female   DOB: [birth year],    MRN: [id number]    Brief patient profile:  3 yobf quit smoking 1985 and no sequelae with one episode of cough/ wheezing while working at [employer] ? Early 2000s saw ent ? Who > ? Dx ? Treatment and resolved  and started coughing again around early 2018 indolent onset persistent daily so referred to pulmonary clinic 09/22/2017 by Dr   Johnnye Lana     History of Present Illness  09/22/2017 1st [facility] office visit/ [name]   Chief Complaint  Patient presents with  . Pulmonary Consult    Referred by Dr. [name]. Pt c/o cough for the past year- occ prod with yellow sputum and worse when she lies down, occ wakes her up.    tessalon helps some/ so does zyrtec never takes both, both work about 75%  Perfume makes it worse Seems worse at hs also but rarely wakes her  MMRC1 = can walk nl pace, flat grade, can't hurry or go uphills or steps s sob  - can walk 3 miles w/out stopping  RA good shape   Kouffman Reflux v Neurogenic Cough Differentiator Reflux Comments  Do you awaken from a sound sleep coughing violently?                            With trouble breathing? Yes no   Do you have choking episodes when you cannot  Get enough air, gasping for air ?              Yes   Do you usually cough when you lie down into  The bed, or when you just lie down to rest ?                          Yes   Do you usually cough after meals or eating?         sometimes   Do you cough when (or after) you bend over?    No   GERD SCORE     Kouffman Reflux v Neurogenic Cough Differentiator Neurogenic   Do you more-or-less cough all day long? Sporadic    Does change of temperature make you cough? no   Does laughing or chuckling cause you to cough? yes   Do fumes (perfume, automobile fumes, burned  Toast, etc.,) cause you to cough ?      yes   Does speaking, singing, or talking on the phone cause you to cough   ?               yes    Neurogenic/Airway score        rec Pantoprazole (protonix) 40 mg   Take  30-60 min before first meal of the day and Pepcid (famotidine)  20 mg one @  bedtime until return to office - this is the best way to tell whether stomach acid is contributing to your problem.   GERD diet  For drainage / throat tickle try take CHLORPHENIRAMINE  4 mg - take one every 4 hours as needed - available over the counter- may cause drowsiness so start with just a bedtime dose or two and see how you tolerate it before trying in daytime   Bedtime = one hour before  Continue Tessalon up to every 6-8 hours as needed with the goal to  stop all coughing  see if you can find out who you saw for your throat previously and get the name or note he wrote      Please schedule a follow up office visit in 4 weeks, sooner if needed  with all medications /inhalers/ solutions in hand so we can verify exactly what you are taking. This includes all medications from all doctors and over the counters  - PFTs on return    10/20/2017  f/u ov/Clorinda Wyble re:  [facility]/  Chief Complaint  Patient presents with  . Follow-up    Cough has improved some, esp at night and waking her up less. She is coughing up minimal clear sputum.   Dyspnea:  MMRC1 = can walk nl pace, flat grade, can't hurry or go uphills or steps s sob   Cough: very sensitive to perfume/ lotions rare tessalon Sleep: fine p h1 and tessalon and does not wish for more rx at this point   No obvious day to day or daytime variability or assoc excess/ purulent sputum or mucus plugs or hemoptysis or cp or chest tightness, subjective wheeze or overt sinus or hb symptoms. No unusual exposure hx or h/o childhood pna/ asthma or knowledge of premature birth.  Sleeping better now   without nocturnal  or early am exacerbation  of respiratory  c/o's or need for noct saba. Also denies any obvious fluctuation of symptoms with weather or environmental changes or other aggravating or alleviating  factors except as outlined above   Current Allergies, Complete Past Medical History, Past Surgical History, Family History, and Social History were reviewed in [employer] record.  ROS  The following are not active complaints unless bolded Hoarseness, sore throat, dysphagia, dental problems, itching, sneezing,  nasal congestion or discharge of excess mucus or purulent secretions, ear ache,   fever, chills, sweats, unintended wt loss or wt gain, classically pleuritic or exertional cp,  orthopnea pnd or arm/hand swelling  or leg swelling, presyncope, palpitations, abdominal pain, anorexia, nausea, vomiting, diarrhea  or change in bowel habits or change in bladder habits, change in stools or change in urine, dysuria, hematuria,  rash, arthralgias, visual complaints, headache, numbness, weakness or ataxia or problems with walking or coordination,  change in mood or  memory.        Current Meds  Medication Sig  . anastrozole (ARIMIDEX) 1 MG tablet Take 1 tablet (1 mg total) by mouth daily.  . Azelastine HCl 0.15 % SOLN 1 SPRAY EACH NOSTRIL DAILY AS NEEDED FOR NASAL CONGESTION  . benzonatate (TESSALON) 200 MG capsule Take 30-60 min before first meal of the day  . Cholecalciferol (VITAMIN D-3) 5000 units TABS Take 5,000 Units by mouth daily.  . famotidine (PEPCID) 20 MG tablet One at bedtime  . levothyroxine (SYNTHROID, LEVOTHROID) 88 MCG tablet Take 88 mcg by mouth daily.    . pantoprazole (PROTONIX) 40 MG tablet Take 1 tablet (40 mg total) by mouth daily. Take 30-60 min before first meal of the day                          Objective:   Physical Exam  amb bf all smiles but gets tickle and starts coughing during speech/ urge to clear throat clearly still present     .  10/20/2017       162  09/22/17 164 lb (74.4 kg)  06/14/17 166 lb 11.2 oz (75.6 kg)  12/16/16 164 lb 12.8  oz (74.8 kg)     Vital signs reviewed - Note on arrival 02 sats  98% on  RA        HEENT: nl dentition, turbinates bilaterally, and oropharynx which is pristine . Nl external ear canals without cough reflex   NECK :  without JVD/Nodes/TM/ nl carotid upstrokes bilaterally   LUNGS: no acc muscle use,  Nl contour chest which is clear to A and P bilaterally without cough on insp or exp maneuvers   CV:  RRR  no s3 or murmur or increase in P2, and no edema   ABD:  Moderately obese soft and nontender with nl inspiratory excursion in the supine position. No bruits or organomegaly appreciated, bowel sounds nl  MS:  Nl gait/ ext warm without deformities, calf tenderness, cyanosis or clubbing No obvious joint restrictions   SKIN: warm and dry without lesions    NEURO:  alert, approp, nl sensorium with  no motor or cerebellar deficits apparent.  .      Assessment:

## 2017-10-21 ENCOUNTER — Encounter: Payer: Self-pay | Admitting: Internal Medicine

## 2017-10-21 NOTE — Assessment & Plan Note (Signed)
Allergy profile 09/22/2017 >  Eos 0.1 /  IgE  47 RAST neg - max gerd rx and 1st gen H1 blockers per guidelines  09/22/2017 >>> improved 10/20/2017    Feels much better than baseline and does not want to pursue w/u further but I would have a low threshold to refer to Dr Bettina Gavia at [facility] if not well controlled going forward as she appears to have classic irritable larynx syndrome  and would be good candidate for speech therapy/ trial of gabapentin   Each maintenance medication was reviewed in detail including most importantly the difference between maintenance and as needed and under what circumstances the prns are to be used.  Please see AVS for specific  Instructions which are unique to this visit and I personally typed out  which were reviewed in detail in writing with the patient and a copy provided.  t  Pulmonary f/u is prn

## 2017-11-08 ENCOUNTER — Ambulatory Visit (INDEPENDENT_AMBULATORY_CARE_PROVIDER_SITE_OTHER): Payer: Medicare Other | Admitting: Podiatry

## 2017-11-08 ENCOUNTER — Encounter: Payer: Self-pay | Admitting: Podiatry

## 2017-11-08 DIAGNOSIS — B070 Plantar wart: Secondary | ICD-10-CM | POA: Diagnosis not present

## 2017-11-08 DIAGNOSIS — M21969 Unspecified acquired deformity of unspecified lower leg: Secondary | ICD-10-CM | POA: Diagnosis not present

## 2017-11-08 DIAGNOSIS — M79672 Pain in left foot: Secondary | ICD-10-CM

## 2017-11-08 DIAGNOSIS — Q828 Other specified congenital malformations of skin: Secondary | ICD-10-CM | POA: Diagnosis not present

## 2017-11-08 NOTE — Patient Instructions (Signed)
Seen for painful callus. Noted of new lesion, plantar wart on 5th toe left. Callus debrided. May require wart removal if becomes symptomatic. Return as needed.

## 2017-11-08 NOTE — Progress Notes (Signed)
Subjective: 72 y.o. year old female patient presents complaining of painful callus on left foot.  Objective: Dermatologic: Broad keratoma sub 5 left. New lesion, verruca plantar surface 5th toe left. Vascular: Pedal pulses are all palpable. Orthopedic: Contracted lesser digits bilateral. Neurologic: All epicritic and tactile sensations grossly intact.  Assessment: Plantar keratosis sub 5 left. Plantar wart 5th toe left. Pain in left foot.  Treatment: Painful calluses debrided left foot. Reviewed treatment options for the plantar wart. Return as needed.

## 2017-11-25 DIAGNOSIS — M0589 Other rheumatoid arthritis with rheumatoid factor of multiple sites: Secondary | ICD-10-CM | POA: Diagnosis not present

## 2017-11-25 DIAGNOSIS — Z79899 Other long term (current) drug therapy: Secondary | ICD-10-CM | POA: Diagnosis not present

## 2017-12-06 DIAGNOSIS — M940 Chondrocostal junction syndrome [Tietze]: Secondary | ICD-10-CM | POA: Diagnosis not present

## 2017-12-09 NOTE — Progress Notes (Signed)
[facility]  [phone] Fax:([phone]) [phone]  Clinic follow Up Note   Patient Care Team: [name], MD as PCP - General (Internal Medicine) Earnstine Regal, PA-C as Physician Assistant (Obstetrics and Gynecology) Rolm Bookbinder, MD as Consulting Physician (General Surgery) Truitt Merle, MD as Consulting Physician (Hematology) Thea Silversmith, MD as Consulting Physician (Radiation Oncology) Mauro Kaufmann, RN as Registered Nurse Rockwell Germany, RN as Registered Nurse Sylvan Cheese, NP as Nurse Practitioner (Hematology and Oncology)   Date of Service:  12/12/2017  CHIEF COMPLAINTS:  Follow up right breast DCIS  Oncology History   Breast cancer of upper-outer quadrant of right female breast [facility])   Staging form: Breast, AJCC 7th Edition     Clinical stage from 03/26/2015: Stage 0 (Tis (DCIS), N0, M0) - Unsigned       Breast cancer of upper-outer quadrant of right female breast ([place])   03/13/2015 Mammogram    7 mm oval mass in the right breast is suspicious for malignancy.      03/20/2015 Initial Biopsy    Breast, right, needle core biopsy DUCTAL CARCINOMA IN SITU IN PAPILLOMA, LOW GRADE      03/20/2015 Receptors her2    Estrogen Receptor: 95%, POSITIVE, STRONG STAINING INTENSITY Progesterone Receptor: 95%, POSITIVE, STRONG STAINING INTENSITY      03/20/2015 Clinical Stage    Stage 0: Tis N0      04/09/2015 Surgery    Right breast lumpectomy      04/09/2015 Pathology Results    Right breast lumpectomy showed low-grade DCIS, margins were negative. Fibrocystic changes with adenosis.      04/09/2015 Pathologic Stage    Stage 0: Tis N0      04/11/2015 Procedure    Breast/Ovarian panel ([facility]) revealed variant of uncertain significance called "c.704C>A (p.Thr235Lys; otherwise negative at ATM, BARD1, BRCA1, BRCA2, BRIP1, CDH1, CHEK2, FANCC, MLH1, MSH2, MSH6, NBN, PALB2, PMS2, PTEN, RAD51C, RAD51D, TP53, and XRCC2.       06/20/2015 -  Anti-estrogen oral therapy    anastrozole 1 mg once daily      08/21/2015 Survivorship    Survivorship care plan completed and copy given to patient      03/23/2017 Mammogram    IMPRESSION: No evidence of malignancy       HISTORY OF PRESENTING ILLNESS:  Evelyn Orr 72 y.o. female is here because of her newly diagnosed right breast DCIS. She presents to our multidisciplinary breast clinic today with her husband and other family members.  This was discovered by screening mammogram. She did not have any palpable breast or axillary masses. She denies any new symptoms. She feels well, no complains, she is physically active, she exercises 4 time a week, lives with her husband.  She had bilateral breast reduction in 2003. She has multiple family members who had history of breast cancer.  CURRENT THERAPY: Anastrozole 1 mg once daily, started on 06/20/2015  INTERIM HISTORY  REXANNE INOCENCIO returns for follow-up of her right breast cancer. She was last seen by me 6 months ago. She presents to the clinic today by herself. She notes she is doing well overall with stable arthritis pain. She is taking anastrozole and tolerating well.  She reviewed her medication list. She is on tessalon perls given by her pulmonologist for her recent cough. She is also taking Pepcid and Protonix. She is fine to just take one of them. She takes Vit D but not calcium. She notes she recently saw her RA  and continues to take  Methotrexate 6 pills once a week.      MEDICAL HISTORY:  Past Medical History:  Diagnosis Date  . Arthritis   . Breast cancer ([place])   . Breast cancer of upper-outer quadrant of right female breast ([place]) 03/20/2015  . Cough   . Hypothyroidism   . Nasal congestion   . PONV (postoperative nausea and vomiting)   . Rheumatoid arthritis(714.0)   . Seasonal allergies   . Thyroid disease    hypothyroidism    SURGICAL HISTORY: Past Surgical History:  Procedure  Laterality Date  . BREAST LUMPECTOMY WITH RADIOACTIVE SEED LOCALIZATION Right 04/09/2015   Procedure: BREAST LUMPECTOMY WITH RADIOACTIVE SEED LOCALIZATION;  Surgeon: Rolm Bookbinder, MD;  Location: [place];  Service: General;  Laterality: Right;  . BREAST REDUCTION SURGERY  2001  . CALCANEAL OSTEOTOMY Left 10/11/2013   Procedure: LEFT CALCANEAL OSTEOTOMY;  Surgeon: Wylene Simmer, MD;  Location: [place];  Service: Orthopedics;  Laterality: Left;  . CARPAL TUNNEL RELEASE  2006   rt  . CARPAL TUNNEL RELEASE     left  . CERVICAL FUSION  2009  . COLONOSCOPY     2002,2005,2008 w/[facility]  . GASTROCNEMIUS RECESSION Left 10/11/2013   Procedure: LEFT GASTROC RECESSION; LEFT POSTERIOR TIBIAL TENOLYSIS;  Surgeon: Wylene Simmer, MD;  Location: [place];  Service: Orthopedics;  Laterality: Left;  . KNEE ARTHROSCOPY  2004   right  . POLYPECTOMY    . REMOVAL OF IMPLANT Left 02/14/2014   Procedure: LEFT FOOT REMOVAL OF DEEP IMPLANT;  Surgeon: Wylene Simmer, MD;  Location: [place];  Service: Orthopedics;  Laterality: Left;  . SHOULDER ARTHROSCOPY W/ ROTATOR CUFF REPAIR  2006   right  . THYROIDECTOMY     age 72  . TONSILLECTOMY    . TOTAL KNEE ARTHROPLASTY Right 06/21/2016   Procedure: RIGHT TOTAL KNEE ARTHROPLASTY;  Surgeon: Paralee Cancel, MD;  Location: [facility];  Service: Orthopedics;  Laterality: Right;  . TUBAL LIGATION    . WRIST ARTHRODESIS  2003   left    GYN HISTORY  Menarchal: 14 LMP: 47  Contraceptive: 3-4 years  HRT: no  G2P2: 2 children 28 daughter and 57 son, no breast feeding    SOCIAL HISTORY: Social History   Socioeconomic History  . Marital status: Married    Spouse name: Not on file  . Number of children: Not on file  . Years of education: Not on file  . Highest education level: Not on file  Occupational History  . Not on file  Social Needs  . Financial resource strain: Not on file  . Food insecurity:     Worry: Not on file    Inability: Not on file  . Transportation needs:    Medical: Not on file    Non-medical: Not on file  Tobacco Use  . Smoking status: Former Smoker    Packs/day: 0.25    Years: 21.00    Pack years: 5.25    Types: Cigarettes    Last attempt to quit: 02/13/1984    Years since quitting: 33.8  . Smokeless tobacco: Never Used  Substance and Sexual Activity  . Alcohol use: No  . Drug use: No  . Sexual activity: Yes    Birth control/protection: Post-menopausal  Lifestyle  . Physical activity:    Days per week: Not on file    Minutes per session: Not on file  . Stress: Not on file  Relationships  .  Social connections:    Talks on phone: Not on file    Gets together: Not on file    Attends religious service: Not on file    Active member of club or organization: Not on file    Attends meetings of clubs or organizations: Not on file    Relationship status: Not on file  . Intimate partner violence:    Fear of current or ex partner: Not on file    Emotionally abused: Not on file    Physically abused: Not on file    Forced sexual activity: Not on file  Other Topics Concern  . Not on file  Social History Narrative  . Not on file    FAMILY HISTORY: Family History  Problem Relation Age of Onset  . Heart disease Father   . Heart attack Father   . Hypertension Mother   . Breast cancer Paternal Aunt        dx. 1 or younger  . Colon cancer Paternal Uncle   . Prostate cancer Maternal Uncle   . Other Sister        high breast density  . Prostate cancer Brother 12       unknown Gleason  . Prostate cancer Maternal Grandfather        dx. older age  . Colon polyps Son 48       colonoscopy for unspecified reason; found some colon polyps, has not had one since  . Prostate cancer Maternal Uncle   . Prostate cancer Maternal Uncle   . Colon cancer Other   . Cancer Other        MGM's sister  . Breast cancer Cousin        4 maternal cousin had breast cancer   .  Rectal cancer Neg Hx   . Stomach cancer Neg Hx     ALLERGIES:  has No Known Allergies.  MEDICATIONS:  Current Outpatient Medications  Medication Sig Dispense Refill  . anastrozole (ARIMIDEX) 1 MG tablet Take 1 tablet (1 mg total) by mouth daily. 90 tablet 3  . Azelastine HCl 0.15 % SOLN 1 SPRAY EACH NOSTRIL DAILY AS NEEDED FOR NASAL CONGESTION  1  . benzonatate (TESSALON) 200 MG capsule Take 30-60 min before first meal of the day 30 capsule 2  . Cholecalciferol (VITAMIN D-3) 5000 units TABS Take 5,000 Units by mouth daily.    . famotidine (PEPCID) 20 MG tablet One at bedtime 30 tablet 11  . levothyroxine (SYNTHROID, LEVOTHROID) 88 MCG tablet Take 88 mcg by mouth daily.      . methotrexate (RHEUMATREX) 2.5 MG tablet Take 2.5 mg by mouth once a week. 6 tablets weekly  1  . pantoprazole (PROTONIX) 40 MG tablet Take 1 tablet (40 mg total) by mouth daily. Take 30-60 min before first meal of the day 30 tablet 2   No current facility-administered medications for this visit.     REVIEW OF SYSTEMS:   Constitutional: Denies fevers, chills or abnormal night sweats   Eyes: Denies blurriness of vision, double vision or watery eyes Ears, nose, mouth, throat, and face: Denies mucositis or sore throat Respiratory: Denies cough, dyspnea or wheezes Cardiovascular: Denies palpitation, chest discomfort or lower extremity swelling Gastrointestinal:  Denies nausea, heartburn or change in bowel habits Skin: Denies abnormal skin rashes MSK: (+) Rheumatoid Arthritis, controlled with methotrexate Lymphatics: Denies new lymphadenopathy or easy bruising Neurological:Denies numbness, tingling or new weaknesses Behavioral/Psych: Mood is stable, no new changes  All other systems were  reviewed with the patient and are negative.  PHYSICAL EXAMINATION:  ECOG PERFORMANCE STATUS: 0 - Asymptomatic  Vitals:   12/12/17 0831  BP: 138/76  Pulse: 72  Resp: 17  Temp: 98.7 F (37.1 C)  SpO2: 99%   Filed Weights     12/12/17 0831  Weight: 160 lb 4.8 oz (72.7 kg)    GENERAL:alert, no distress and comfortable SKIN: skin color, texture, turgor are normal, no rashes or significant lesions EYES: normal, conjunctiva are pink and non-injected, sclera clear OROPHARYNX:no exudate, no erythema and lips, buccal mucosa, and tongue normal  NECK: supple, thyroid normal size, non-tender, without nodularity LYMPH:  no palpable lymphadenopathy in the cervical, axillary or inguinal LUNGS: clear to auscultation and percussion with normal breathing effort HEART: regular rate & rhythm and no murmurs and no lower extremity edema ABDOMEN:abdomen soft, non-tender and normal bowel sounds Musculoskeletal:no cyanosis of digits and no clubbing  PSYCH: alert & oriented x 3 with fluent speech NEURO: no focal motor/sensory deficits Breasts: Breast inspection showed them to be symmetrical with no nipple discharge.  (+) S/p right breast lumpectomy: The incision in the right breast is healing very well. Palpation of both breasts and axilla revealed no obvious mass that I could appreciate.   LABORATORY DATA:  I have reviewed the data as listed Lab Results  Component Value Date   WBC 5.7 12/12/2017   HGB 12.5 12/12/2017   HCT 38.5 12/12/2017   MCV 92.9 12/12/2017   PLT 308 12/12/2017   Recent Labs    12/16/16 1245 06/14/17 0753 12/12/17 0809  NA 139 141 139  K 3.9 3.7 4.1  CL  --  105 104  CO2 26 27 26   GLUCOSE 83 83 94  BUN 9.3 9 8   CREATININE 0.9 0.81 0.77  CALCIUM 10.0 9.5 9.6  GFRNONAA  --  >60 >60  GFRAA  --  >60 >60  PROT 8.3 7.8 8.0  ALBUMIN 3.8 3.7 3.8  AST 27 19 19   ALT 15 12 13   ALKPHOS 116 107 108  BILITOT 0.44 0.5 0.5    Pathology report  Diagnosis 04/09/2015 1. Breast, lumpectomy, right - COMPLEX SCLEROSING LESION SHOWING FIBROCYSTIC CHANGES WITH ADENOSIS, USUAL DUCTAL HYPERPLASIA AND LOW GRADE DUCTAL CARCINOMA IN SITU. - MARGINS ARE NEGATIVE. - SEE ONCOLOGY TEMPLATE. 2. Breast, excision,  right medial margin - COMPLEX SCLEROSING LESION WITH FIBROCYSTIC CHANGES. - NO ATYPIA, HYPERPLASIA, OR MALIGNANCY IDENTIFIED. Marland Kitchen 3. Breast, excision, right superior margin - COMPLEX SCLEROSING LESION SHOWING FIBROCYSTIC CHANGES WITH ADENOSIS AND USUAL DUCTAL HYPERPLASIA. - NO ATYPIA OR MALIGNANCY IDENTIFIED. Microscopic Comment 1. BREAST, IN SITU CARCINOMA Specimen, including laterality: Right partial breast with additional right medial and right superior margins. Procedure (include lymph node sampling sentinel-non-sentinel): Right breast lumpectomy with additional right medial and right superior margin excisions. Grade of carcinoma: Low grade. Necrosis: No. Estimated tumor size: (glass slide measurement): Largest residual focus of ductal carcinoma in situ measures 0.3 cm. Treatment effect: Not applicable. Distance to closest margin: Ductal carcinoma in situ is at least 0.4 cm from all margins (closest to medial margin). Breast prognostic profile: Performed on previous case, [phone]. Estrogen receptor: 95%, positive. Progesterone receptor: 95%, positive. Lymph nodes: No lymph nodes received. TNM: pTis, pNX.   RADIOGRAPHIC STUDIES: I have personally reviewed the radiological images as listed and agreed with the findings in the report.  DEXA 05/30/17: T Score = -2.4  Diagnostic Mammogram 03/23/17 IMPRESSION: No evidence of malignancy  DEXA 05/29/2015: OSTEOPENIA 10 year risk of fracture: major  osteoprotic 5.9%, hip 0.4%  ASSESSMENT & PLAN:  72 y.o. postmenopausal woman, presented with screening discovered DCIS.  1. Right breast DCIS, low grade, ER and PR strongly positive - I previously discussed her surgical pathology result with her in details.  It showed low-grade DCIS, no evidence of invasive carcinoma. Her surgical margins were negative. -Her DCIS is cured by complete surgical resection from 04/09/15 right breast lumpectomy. Any form of adjuvant therapy is  preventive. -adjuvant irradiation was not recommended. -She is currently on anastrozole since 06/2015 to prevent future breast cancer. Tolerating well, we'll continue,  plan for a total of 5 years -We previously discussed breast cancer surveillance after she completes treatment, Including annual mammogram, monthly self breast exam, and breast exam by a physician every 6-12 months. -She is clinically doing well. Lab reviewed, her CBC and CMP are within normal limits. Her physical exam and her 02/2017 mammogram were unremarkable. There is no clinical concern for recurrence. -Next Mammogram 02/2018 -Continue anastrozole  -F/u in 1 year as she is regularly seen by her other physicians.    2. Osteopenia -Her bone density scan in December 2016 showed osteopenia -Her risk of fracture is not high, no need for biphosphonate. -DEXA on 05/30/17 had a T score of -2.4 of AP, unchanged from previous scan  -I discussed VitD alone is not enough, I encouraged her to take calcium supplement as well and to remain active with walking often.    3. Hyperthyroidism and rheumatoid arthritis -She will continue follow-up with her primary care physician. -She is on weekly methotrexate, and her rheumatoid arthritis has been well-controlled.  Plan -continue Anastrozole  -F/u in 1 year    All questions were answered. The patient knows to call the clinic with any problems, questions or concerns. I spent 15 minutes counseling the patient face to face. The total time spent in the appointment was 20 minutes and more than 50% was on counseling.  Oneal Deputy, am acting as scribe for Truitt Merle, MD.   I have reviewed the above documentation for accuracy and completeness, and I agree with the above.      Truitt Merle, MD 12/12/2017 9:11 AM

## 2017-12-12 ENCOUNTER — Telehealth: Payer: Self-pay | Admitting: Hematology

## 2017-12-12 ENCOUNTER — Inpatient Hospital Stay: Payer: Medicare Other

## 2017-12-12 ENCOUNTER — Encounter: Payer: Self-pay | Admitting: Hematology

## 2017-12-12 ENCOUNTER — Inpatient Hospital Stay: Payer: Medicare Other | Attending: Hematology | Admitting: Hematology

## 2017-12-12 VITALS — BP 138/76 | HR 72 | Temp 98.7°F | Resp 17 | Ht 64.0 in | Wt 160.3 lb

## 2017-12-12 DIAGNOSIS — M8580 Other specified disorders of bone density and structure, unspecified site: Secondary | ICD-10-CM

## 2017-12-12 DIAGNOSIS — E0590 Thyrotoxicosis, unspecified without thyrotoxic crisis or storm: Secondary | ICD-10-CM | POA: Diagnosis not present

## 2017-12-12 DIAGNOSIS — D0511 Intraductal carcinoma in situ of right breast: Secondary | ICD-10-CM | POA: Diagnosis not present

## 2017-12-12 DIAGNOSIS — Z79811 Long term (current) use of aromatase inhibitors: Secondary | ICD-10-CM | POA: Diagnosis not present

## 2017-12-12 DIAGNOSIS — M069 Rheumatoid arthritis, unspecified: Secondary | ICD-10-CM | POA: Diagnosis not present

## 2017-12-12 DIAGNOSIS — Z87891 Personal history of nicotine dependence: Secondary | ICD-10-CM

## 2017-12-12 DIAGNOSIS — C50411 Malignant neoplasm of upper-outer quadrant of right female breast: Secondary | ICD-10-CM | POA: Diagnosis not present

## 2017-12-12 DIAGNOSIS — Z803 Family history of malignant neoplasm of breast: Secondary | ICD-10-CM

## 2017-12-12 DIAGNOSIS — Z170 Estrogen receptor positive status [ER+]: Secondary | ICD-10-CM | POA: Insufficient documentation

## 2017-12-12 DIAGNOSIS — Z800 Family history of malignant neoplasm of digestive organs: Secondary | ICD-10-CM

## 2017-12-12 DIAGNOSIS — Z79899 Other long term (current) drug therapy: Secondary | ICD-10-CM | POA: Insufficient documentation

## 2017-12-12 LAB — COMPREHENSIVE METABOLIC PANEL
ALK PHOS: 108 U/L (ref 38–126)
ALT: 13 U/L (ref 0–44)
AST: 19 U/L (ref 15–41)
Albumin: 3.8 g/dL (ref 3.5–5.0)
Anion gap: 9 (ref 5–15)
BILIRUBIN TOTAL: 0.5 mg/dL (ref 0.3–1.2)
BUN: 8 mg/dL (ref 8–23)
CO2: 26 mmol/L (ref 22–32)
CREATININE: 0.77 mg/dL (ref 0.44–1.00)
Calcium: 9.6 mg/dL (ref 8.9–10.3)
Chloride: 104 mmol/L (ref 98–111)
GFR calc Af Amer: >60 mL/min (ref >60)
Glucose, Bld: 94 mg/dL (ref 70–99)
Potassium: 4.1 mmol/L (ref 3.5–5.1)
Sodium: 139 mmol/L (ref 135–145)
TOTAL PROTEIN: 8 g/dL (ref 6.5–8.1)

## 2017-12-12 LAB — CBC WITH DIFFERENTIAL/PLATELET
BASOS ABS: 0 10*3/uL (ref 0.0–0.1)
Basophils Relative: 1 %
Eosinophils Absolute: 0.1 10*3/uL (ref 0.0–0.5)
Eosinophils Relative: 2 %
HEMATOCRIT: 38.5 % (ref 34.8–46.6)
Hemoglobin: 12.5 g/dL (ref 11.6–15.9)
LYMPHS ABS: 3.1 10*3/uL (ref 0.9–3.3)
LYMPHS PCT: 54 %
MCH: 30.3 pg (ref 25.1–34.0)
MCHC: 32.6 g/dL (ref 31.5–36.0)
MCV: 92.9 fL (ref 79.5–101.0)
MONO ABS: 0.3 10*3/uL (ref 0.1–0.9)
Monocytes Relative: 5 %
Neutro Abs: 2.2 10*3/uL (ref 1.5–6.5)
Neutrophils Relative %: 38 %
Platelets: 308 10*3/uL (ref 145–400)
RBC: 4.14 MIL/uL (ref 3.70–5.45)
RDW: 14.7 % — ABNORMAL HIGH (ref 11.2–14.5)
WBC: 5.7 10*3/uL (ref 3.9–10.3)

## 2017-12-12 NOTE — Telephone Encounter (Signed)
Gave patient avs and calendar of upcoming July 2020 appts. Patient scheduled at [facility] at end of oct.

## 2017-12-17 ENCOUNTER — Other Ambulatory Visit: Payer: Self-pay | Admitting: Internal Medicine

## 2017-12-17 DIAGNOSIS — R05 Cough: Secondary | ICD-10-CM

## 2017-12-17 DIAGNOSIS — R058 Other specified cough: Secondary | ICD-10-CM

## 2017-12-22 DIAGNOSIS — M0589 Other rheumatoid arthritis with rheumatoid factor of multiple sites: Secondary | ICD-10-CM | POA: Diagnosis not present

## 2017-12-22 DIAGNOSIS — Z79899 Other long term (current) drug therapy: Secondary | ICD-10-CM | POA: Diagnosis not present

## 2017-12-22 DIAGNOSIS — Z13820 Encounter for screening for osteoporosis: Secondary | ICD-10-CM | POA: Diagnosis not present

## 2017-12-28 DIAGNOSIS — M25552 Pain in left hip: Secondary | ICD-10-CM | POA: Diagnosis not present

## 2017-12-30 ENCOUNTER — Other Ambulatory Visit: Payer: Self-pay

## 2018-01-06 ENCOUNTER — Other Ambulatory Visit: Payer: Self-pay | Admitting: Hematology

## 2018-01-24 DIAGNOSIS — M25552 Pain in left hip: Secondary | ICD-10-CM | POA: Diagnosis not present

## 2018-01-26 DIAGNOSIS — Z0000 Encounter for general adult medical examination without abnormal findings: Secondary | ICD-10-CM | POA: Diagnosis not present

## 2018-01-26 LAB — BASIC METABOLIC PANEL
BUN: 11 (ref 4–21)
Creatinine: 0.7 (ref <=1.1)
Potassium: 4.5 (ref 3.4–5.3)
Sodium: 142 (ref 137–147)

## 2018-01-26 LAB — TSH: TSH: 0.48 (ref 0.41–5.90)

## 2018-01-26 LAB — HEMOGLOBIN A1C: Hemoglobin A1C: 5.6

## 2018-02-21 ENCOUNTER — Ambulatory Visit (INDEPENDENT_AMBULATORY_CARE_PROVIDER_SITE_OTHER): Payer: Medicare Other | Admitting: Podiatry

## 2018-02-21 DIAGNOSIS — M79672 Pain in left foot: Secondary | ICD-10-CM | POA: Diagnosis not present

## 2018-02-21 DIAGNOSIS — M21969 Unspecified acquired deformity of unspecified lower leg: Secondary | ICD-10-CM | POA: Diagnosis not present

## 2018-02-21 DIAGNOSIS — Q828 Other specified congenital malformations of skin: Secondary | ICD-10-CM

## 2018-02-21 DIAGNOSIS — H40023 Open angle with borderline findings, high risk, bilateral: Secondary | ICD-10-CM | POA: Diagnosis not present

## 2018-02-21 DIAGNOSIS — H04123 Dry eye syndrome of bilateral lacrimal glands: Secondary | ICD-10-CM | POA: Diagnosis not present

## 2018-02-21 DIAGNOSIS — H1013 Acute atopic conjunctivitis, bilateral: Secondary | ICD-10-CM | POA: Diagnosis not present

## 2018-02-21 NOTE — Patient Instructions (Signed)
Seen for painful lesion left foot. Lesion debrided. Pain relieved. Return as needed.

## 2018-02-26 ENCOUNTER — Encounter: Payer: Self-pay | Admitting: Podiatry

## 2018-02-26 NOTE — Progress Notes (Signed)
Subjective: 72 y.o. year old female patient presents complaining of painful callus on left foot.  Objective: Dermatologic: Broad keratoma sub 5 left. Vascular: Pedal pulses are all palpable. Orthopedic: Contracted lesser digits bilateral. Neurologic: All epicritic and tactile sensations grossly intact.  Assessment: Plantar keratosis sub 5 left. Pain in left foot.  Treatment: Painful calluses debrided left foot. Return as needed.

## 2018-03-01 ENCOUNTER — Ambulatory Visit (INDEPENDENT_AMBULATORY_CARE_PROVIDER_SITE_OTHER): Payer: Medicare Other | Admitting: Internal Medicine

## 2018-03-01 ENCOUNTER — Encounter: Payer: Self-pay | Admitting: Internal Medicine

## 2018-03-01 ENCOUNTER — Telehealth: Payer: Self-pay | Admitting: Internal Medicine

## 2018-03-01 ENCOUNTER — Ambulatory Visit
Admission: RE | Admit: 2018-03-01 | Discharge: 2018-03-01 | Disposition: A | Discharge status: Home / Self Care | Payer: BC Managed Care – PPO | Source: Ambulatory Visit | Attending: Internal Medicine | Admitting: Internal Medicine

## 2018-03-01 VITALS — BP 120/78 | HR 71 | Temp 98.3°F | Wt 161.2 lb

## 2018-03-01 DIAGNOSIS — R059 Cough, unspecified: Secondary | ICD-10-CM

## 2018-03-01 DIAGNOSIS — J189 Pneumonia, unspecified organism: Secondary | ICD-10-CM

## 2018-03-01 DIAGNOSIS — R05 Cough: Secondary | ICD-10-CM

## 2018-03-01 DIAGNOSIS — J40 Bronchitis, not specified as acute or chronic: Secondary | ICD-10-CM | POA: Diagnosis not present

## 2018-03-01 MED ORDER — ALBUTEROL SULFATE HFA 108 (90 BASE) MCG/ACT IN AERS: 2.0000 | INHALATION_SPRAY | RESPIRATORY_TRACT | Status: DC

## 2018-03-01 MED ORDER — IPRATROPIUM-ALBUTEROL 0.5-2.5 (3) MG/3ML IN SOLN
3.0000 mL | Freq: Once | RESPIRATORY_TRACT | Status: AC
  Administered 2018-03-01: 3 mL via RESPIRATORY_TRACT

## 2018-03-01 MED ORDER — AZITHROMYCIN 250 MG PO TABS: ORAL_TABLET | ORAL | 0 refills | Status: AC

## 2018-03-01 MED ORDER — IPRATROPIUM-ALBUTEROL 0.5-2.5 (3) MG/3ML IN SOLN: 3.0000 mL | Freq: Once | RESPIRATORY_TRACT | Status: DC

## 2018-03-01 MED ORDER — CEFTRIAXONE SODIUM 1 G IJ SOLR
1.0000 g | Freq: Once | INTRAMUSCULAR | Status: AC
  Administered 2018-03-01: 1 g via INTRAMUSCULAR

## 2018-03-01 NOTE — Patient Instructions (Signed)
Get Evelyn Orr saline rinse kit and use it twice a day but not at bed time.  Do not use tap water. Only distilled or boiled.

## 2018-03-01 NOTE — Telephone Encounter (Signed)
This was a phone note only.

## 2018-03-01 NOTE — Progress Notes (Addendum)
Subjective:     Patient ID: Evelyn Orr, female   DOB: 1945-10-11, 72 y.o.   MRN: [id number]  Onset of ST 5 days ago which lasted 2 days, then developed post nasal drainage and cough. Now her chest hurts to cough. Cough is with occasional clear to yellow. Saw a tinge of blood Monday, but none since. Denies fever or night sweats. Has been chilling. No HA or body aches. ST better today. Has taken allergy pill last night which helped the post nasal drainage. She has been having a lot of cough attacks and her lower anterior ribs are very sore.   No Known Allergies  MEDS Azelastine HCl azithromycin (ZITHROMAX) tablet benzonatate (TESSALON) capsule levothyroxine (SYNTHROID, LEVOTHROID) tablet methotrexate tablet pantoprazole (PROTONIX) EC tablet Vitamin D-3  Pepsid 20 mg qhs  Past Medical History:  Diagnosis Date  . Arthritis   . Breast cancer ([place])   . Breast cancer of upper-outer quadrant of right female breast ([place]) 03/20/2015  . Cough   . Hypothyroidism   . Nasal congestion   . PONV (postoperative nausea and vomiting)   . Rheumatoid arthritis(714.0)   . Seasonal allergies   . Thyroid disease    hypothyroidism    Review of Systems  Constitutional: Positive for chills. Negative for appetite change, diaphoresis and fever.  HENT: Positive for congestion and rhinorrhea. Negative for ear discharge, ear pain, facial swelling, hearing loss, nosebleeds, postnasal drip, sinus pressure, sore throat, trouble swallowing and voice change.   Eyes: Positive for itching. Negative for discharge.  Respiratory: Positive for cough and shortness of breath. Negative for apnea and wheezing.   Cardiovascular: Negative for chest pain and leg swelling.  Gastrointestinal: Negative for diarrhea and nausea.  Genitourinary: Negative for dysuria.  Musculoskeletal: Negative for arthralgias, gait problem, neck pain and neck stiffness.  Skin: Negative for rash.  Allergic/Immunologic: Negative for  environmental allergies.  Neurological: Negative for dizziness and weakness.       Objective:   Physical Exam  Constitutional: She is oriented to person, place, and time. She appears well-developed and well-nourished. No distress.  HENT:  Head: Normocephalic and atraumatic.  Right Ear: External ear normal.  Left Ear: External ear normal.  Mouth/Throat: Oropharynx is clear and moist. No oropharyngeal exudate.  Has pale pink mucosa with clear mucous  Eyes: Conjunctivae are normal.  Neck: Normal range of motion. Neck supple. No thyromegaly present.  Cardiovascular: Normal rate, regular rhythm and intact distal pulses.  No murmur heard. Pulmonary/Chest: Effort normal. No stridor. She has no wheezes. She exhibits tenderness.  Has a lot of coughing with deep breathing and exhalation Lower anterior ribs are tender to palpation.   Musculoskeletal: She exhibits no edema.  Lymphadenopathy:    She has no cervical adenopathy.  Neurological: She is alert and oriented to person, place, and time.  Skin: Skin is warm and dry. Capillary refill takes less than 2 seconds. No rash noted. She is not diaphoretic. No erythema. No pallor.  Psychiatric: She has a normal mood and affect. Her behavior is normal. Judgment and thought content normal.  Vitals reviewed.    Vitals:   03/01/18 0937  BP: 120/78  Pulse: 71  Temp: 98.3 F (36.8 C)  SpO2: 96%   Vitals:   03/01/18 0937  Weight: 161 lb 3.2 oz (73.1 kg)       Assessment:    Cough   Bronchitis Plan:     CXR ordered- negative Rocephin 1G IM given before she left the office.  She was placed on Zpack  I placed her on Proventil inhaler 2 puffs qid x 2-3 days for cough. I showed her a video how to do Netie pot rinses and advised to do this bid for  Few days.  Fu if she gets worse.

## 2018-03-11 ENCOUNTER — Encounter: Payer: Self-pay | Admitting: Internal Medicine

## 2018-03-11 DIAGNOSIS — Z0000 Encounter for general adult medical examination without abnormal findings: Secondary | ICD-10-CM | POA: Insufficient documentation

## 2018-03-13 ENCOUNTER — Ambulatory Visit (INDEPENDENT_AMBULATORY_CARE_PROVIDER_SITE_OTHER): Payer: Medicare Other | Admitting: Internal Medicine

## 2018-03-13 ENCOUNTER — Encounter: Payer: Self-pay | Admitting: Internal Medicine

## 2018-03-13 VITALS — BP 114/80 | HR 83 | Temp 98.2°F | Ht 60.5 in | Wt 159.2 lb

## 2018-03-13 DIAGNOSIS — E7800 Pure hypercholesterolemia, unspecified: Secondary | ICD-10-CM

## 2018-03-13 DIAGNOSIS — Z0000 Encounter for general adult medical examination without abnormal findings: Secondary | ICD-10-CM | POA: Diagnosis not present

## 2018-03-13 DIAGNOSIS — E559 Vitamin D deficiency, unspecified: Secondary | ICD-10-CM

## 2018-03-13 DIAGNOSIS — J301 Allergic rhinitis due to pollen: Secondary | ICD-10-CM

## 2018-03-13 DIAGNOSIS — E039 Hypothyroidism, unspecified: Secondary | ICD-10-CM

## 2018-03-13 LAB — POCT URINALYSIS DIPSTICK
Bilirubin, UA: NEGATIVE
GLUCOSE UA: NEGATIVE
Ketones, UA: NEGATIVE
Nitrite, UA: NEGATIVE
PROTEIN UA: NEGATIVE
RBC UA: NEGATIVE
Spec Grav, UA: 1.015 (ref 1.010–1.025)
Urobilinogen, UA: 0.2 E.U./dL
pH, UA: 7 (ref 5.0–8.0)

## 2018-03-13 NOTE — Progress Notes (Signed)
Subjective:     Patient ID: Evelyn Orr , female    DOB: 19-Jun-1945 , 72 y.o.   MRN: [id number]   SHE IS HERE TODAY FOR A FULL PHYSICAL EXAMINATION.  SHE HAS NO SPECIFIC CONCERNS OR COMPLAINTS AT THIS TIME.   Thyroid Problem  Presents for follow-up visit. Patient reports no constipation, heat intolerance, hoarse voice, leg swelling, palpitations, tremors or visual change. The symptoms have been stable.     Past Medical History:  Diagnosis Date  . Arthritis   . Breast cancer ([place])   . Breast cancer of upper-outer quadrant of right female breast ([place]) 03/20/2015  . Cough   . Hypothyroidism   . Nasal congestion   . PONV (postoperative nausea and vomiting)   . Rheumatoid arthritis(714.0)   . Seasonal allergies   . Thyroid disease    hypothyroidism      Current Outpatient Medications:  .  anastrozole (ARIMIDEX) 1 MG tablet, Take 1 mg by mouth daily., Disp: , Rfl:  .  Azelastine HCl 0.15 % SOLN, 1 SPRAY EACH NOSTRIL DAILY AS NEEDED FOR NASAL CONGESTION, Disp: , Rfl: 1 .  benzonatate (TESSALON) 100 MG capsule, Take by mouth 3 (three) times daily as needed for cough., Disp: , Rfl:  .  Cholecalciferol (VITAMIN D-3) 5000 units TABS, Take 5,000 Units by mouth daily., Disp: , Rfl:  .  dexlansoprazole (DEXILANT) 60 MG capsule, Take 60 mg by mouth daily., Disp: , Rfl:  .  levothyroxine (SYNTHROID, LEVOTHROID) 88 MCG tablet, Take 88 mcg by mouth daily.  , Disp: , Rfl:  .  methotrexate (RHEUMATREX) 2.5 MG tablet, Take 2.5 mg by mouth once a week. 6 tablets weekly, Disp: , Rfl: 1 .  pantoprazole (PROTONIX) 40 MG tablet, TAKE 1 TABLET (40 MG TOTAL) BY MOUTH DAILY. TAKE 30-60 MIN BEFORE FIRST MEAL., Disp: 90 tablet, Rfl: 0   No Known Allergies   Review of Systems  Constitutional: Negative.   HENT: Negative.  Negative for hoarse voice.   Respiratory: Negative.   Cardiovascular: Negative.  Negative for palpitations.  Gastrointestinal: Negative.  Negative for constipation.  Endocrine:  Negative.  Negative for heat intolerance.  Genitourinary: Negative.   Neurological: Negative.  Negative for tremors.  Psychiatric/Behavioral: Negative.   All other systems reviewed and are negative.    Today's Vitals   03/13/18 0909  BP: 114/80  Pulse: 83  Temp: 98.2 F (36.8 C)  Weight: 159 lb 3.2 oz (72.2 kg)  Height: 5' 0.5" (1.537 m)  PainSc: 0-No pain   Body mass index is 30.58 kg/m.   Objective:  Physical Exam  Constitutional: She is oriented to person, place, and time. She appears well-developed and well-nourished.  HENT:  Head: Normocephalic and atraumatic.  Right Ear: External ear normal.  Left Ear: External ear normal.  Nose: Nose normal.  Mouth/Throat: Oropharynx is clear and moist.  Eyes: Pupils are equal, round, and reactive to light. Conjunctivae and EOM are normal.  Neck: Normal range of motion. Neck supple.  Cardiovascular: Normal rate, regular rhythm, normal heart sounds and intact distal pulses.  Pulmonary/Chest: Effort normal and breath sounds normal. Right breast exhibits no inverted nipple, no mass, no nipple discharge, no skin change and no tenderness. Left breast exhibits no inverted nipple, no mass, no nipple discharge, no skin change and no tenderness.    Abdominal: Soft. Bowel sounds are normal.  Musculoskeletal: Normal range of motion.  Neurological: She is alert and oriented to person, place, and time.  Skin: Skin is  warm and dry.  Psychiatric: She has a normal mood and affect.  Nursing note and vitals reviewed.       Assessment And Plan:     1. Primary hypothyroidism  I WILL CHECK A THYROID PANEL AND ADJUST MEDS AS NEEDED.   2. Vitamin D deficiency disease  I WILL CHECK A VITAMIN D LEVEL AND SUPPLEMENT AS NEEDED.   3. High cholesterol  WE DISCUSSED IMPORTANCE OF FOLLOWING A HIGH FIBER DIET.SHE IS ALSO ENCOURAGED TO AVOID FRIED FOODS, CONTINUE WITH HER REGULAR EXERCISE REGIMEN AND TO EAT FISH TWICE WEEKLY.   4. Non-seasonal  allergic rhinitis due to pollen  SHE WILL CONTINUE WITH CURRENT MEDS.   5. Routine general medical examination at health care facility  A FULL EXAM WAS PERFORMED.  IMPORTANCE OF MONTHLY SELF BREAST EXAMS WAS DISCUSSED WITH THE PATIENT. PATIENT HAS BEEN ADVISED TO GET 30-45 MINUTES REGULAR EXERCISE NO LESS THAN FOUR TO FIVE DAYS PER WEEK - BOTH WEIGHTBEARING EXERCISES AND AEROBIC ARE RECOMMENDED.  SHE IS ADVISED TO FOLLOW A HEALTHY DIET WITH AT LEAST SIX FRUITS/VEGGIES PER DAY, DECREASE INTAKE OF RED MEAT, AND TO INCREASE FISH INTAKE TO TWO DAYS PER WEEK.  MEATS/FISH SHOULD NOT BE FRIED, BAKED OR BROILED IS PREFERABLE.  I SUGGEST WEARING SPF 50 SUNSCREEN ON EXPOSED PARTS AND ESPECIALLY WHEN IN THE DIRECT SUNLIGHT FOR AN EXTENDED PERIOD OF TIME.  PLEASE AVOID FAST FOOD RESTAURANTS AND INCREASE YOUR WATER INTAKE.    Maximino Greenland, MD

## 2018-03-13 NOTE — Patient Instructions (Addendum)
Hypothyroidism Hypothyroidism is a disorder of the thyroid. The thyroid is a large gland that is located in the lower front of the neck. The thyroid releases hormones that control how the body works. With hypothyroidism, the thyroid does not make enough of these hormones. What are the causes? Causes of hypothyroidism may include:  Viral infections.  Pregnancy.  Your own defense system (immune system) attacking your thyroid.  Certain medicines.  Birth defects.  Past radiation treatments to your head or neck.  Past treatment with radioactive iodine.  Past surgical removal of part or all of your thyroid.  Problems with the gland that is located in the center of your brain (pituitary).  What are the signs or symptoms? Signs and symptoms of hypothyroidism may include:  Feeling as though you have no energy (lethargy).  Inability to tolerate cold.  Weight gain that is not explained by a change in diet or exercise habits.  Dry skin.  Coarse hair.  Menstrual irregularity.  Slowing of thought processes.  Constipation.  Sadness or depression.  How is this diagnosed? Your health care provider may diagnose hypothyroidism with blood tests and ultrasound tests. How is this treated? Hypothyroidism is treated with medicine that replaces the hormones that your body does not make. After you begin treatment, it may take several weeks for symptoms to go away. Follow these instructions at home:  Take medicines only as directed by your health care provider.  If you start taking any new medicines, tell your health care provider.  Keep all follow-up visits as directed by your health care provider. This is important. As your condition improves, your dosage needs may change. You will need to have blood tests regularly so that your health care provider can watch your condition. Contact a health care provider if:  Your symptoms do not get better with treatment.  You are taking thyroid  replacement medicine and: ? You sweat excessively. ? You have tremors. ? You feel anxious. ? You lose weight rapidly. ? You cannot tolerate heat. ? You have emotional swings. ? You have diarrhea. ? You feel weak. Get help right away if:  You develop chest pain.  You develop an irregular heartbeat.  You develop a rapid heartbeat. This information is not intended to replace advice given to you by your health care provider. Make sure you discuss any questions you have with your health care provider. Document Released: 05/17/2005 Document Revised: 10/23/2015 Document Reviewed: 10/02/2013 Elsevier Interactive Patient Education  2018 [place].   High-Fiber Diet Fiber, also called dietary fiber, is a type of carbohydrate found in fruits, vegetables, whole grains, and beans. A high-fiber diet can have many health benefits. Your health care provider may recommend a high-fiber diet to help:  Prevent constipation. Fiber can make your bowel movements more regular.  Lower your cholesterol.  Relieve hemorrhoids, uncomplicated diverticulosis, or irritable bowel syndrome.  Prevent overeating as part of a weight-loss plan.  Prevent heart disease, type 2 diabetes, and certain cancers.  What is my plan? The recommended daily intake of fiber includes:  38 grams for men under age 24.  50 grams for men over age 64.  69 grams for women under age 16.  33 grams for women over age 3.  You can get the recommended daily intake of dietary fiber by eating a variety of fruits, vegetables, grains, and beans. Your health care provider may also recommend a fiber supplement if it is not possible to get enough fiber through your diet. What  do I need to know about a high-fiber diet?  Fiber supplements have not been widely studied for their effectiveness, so it is better to get fiber through food sources.  Always check the fiber content on thenutrition facts label of any prepackaged food. Look for  foods that contain at least 5 grams of fiber per serving.  Ask your dietitian if you have questions about specific foods that are related to your condition, especially if those foods are not listed in the following section.  Increase your daily fiber consumption gradually. Increasing your intake of dietary fiber too quickly may cause bloating, cramping, or gas.  Drink plenty of water. Water helps you to digest fiber. What foods can I eat? Grains Whole-grain breads. Multigrain cereal. Oats and oatmeal. Brown rice. Barley. Bulgur wheat. [place]. Bran muffins. Popcorn. Rye wafer crackers. Vegetables Sweet potatoes. Spinach. Kale. Artichokes. Cabbage. Broccoli. Green peas. Carrots. Squash. Fruits Berries. Pears. Apples. Oranges. Avocados. Prunes and raisins. Dried figs. Meats and Other Protein Sources Navy, kidney, pinto, and soy beans. Split peas. Lentils. Nuts and seeds. Dairy Fiber-fortified yogurt. Beverages Fiber-fortified soy milk. Fiber-fortified orange juice. Other Fiber bars. The items listed above may not be a complete list of recommended foods or beverages. Contact your dietitian for more options. What foods are not recommended? Grains White bread. Pasta made with refined flour. White rice. Vegetables Fried potatoes. Canned vegetables. Well-cooked vegetables. Fruits Fruit juice. Cooked, strained fruit. Meats and Other Protein Sources Fatty cuts of meat. Fried Sales executive or fried fish. Dairy Milk. Yogurt. Cream cheese. Sour cream. Beverages Soft drinks. Other Cakes and pastries. Butter and oils. The items listed above may not be a complete list of foods and beverages to avoid. Contact your dietitian for more information. What are some tips for including high-fiber foods in my diet?  Eat a wide variety of high-fiber foods.  Make sure that half of all grains consumed each day are whole grains.  Replace breads and cereals made from refined flour or white flour with  whole-grain breads and cereals.  Replace white rice with brown rice, bulgur wheat, or millet.  Start the day with a breakfast that is high in fiber, such as a cereal that contains at least 5 grams of fiber per serving.  Use beans in place of meat in soups, salads, or pasta.  Eat high-fiber snacks, such as berries, raw vegetables, nuts, or popcorn. This information is not intended to replace advice given to you by your health care provider. Make sure you discuss any questions you have with your health care provider. Document Released: 05/17/2005 Document Revised: 10/23/2015 Document Reviewed: 10/30/2013 Elsevier Interactive Patient Education  [employer].

## 2018-03-14 LAB — CBC
Hematocrit: 37.9 % (ref 34.0–46.6)
Hemoglobin: 12.5 g/dL (ref 11.1–15.9)
MCH: 30.6 pg (ref 26.6–33.0)
MCHC: 33 g/dL (ref 31.5–35.7)
MCV: 93 fL (ref 79–97)
PLATELETS: 371 10*3/uL (ref 150–450)
RBC: 4.09 x10E6/uL (ref 3.77–5.28)
RDW: 14.6 % (ref 12.3–15.4)
WBC: 6.2 10*3/uL (ref 3.4–10.8)

## 2018-03-14 LAB — TSH: TSH: 1.44 u[IU]/mL (ref 0.450–4.500)

## 2018-03-14 LAB — CMP14+EGFR
ALT: 12 IU/L (ref 0–32)
AST: 18 IU/L (ref 0–40)
Albumin/Globulin Ratio: 1.3 (ref 1.2–2.2)
Albumin: 4.5 g/dL (ref 3.5–4.8)
Alkaline Phosphatase: 110 IU/L (ref 39–117)
BILIRUBIN TOTAL: 0.4 mg/dL (ref 0.0–1.2)
BUN/Creatinine Ratio: 10 — ABNORMAL LOW (ref 12–28)
BUN: 7 mg/dL — AB (ref 8–27)
CO2: 23 mmol/L (ref 20–29)
CREATININE: 0.73 mg/dL (ref 0.57–1.00)
Calcium: 9.6 mg/dL (ref 8.7–10.3)
Chloride: 100 mmol/L (ref 96–106)
GFR calc non Af Amer: 84 mL/min/{1.73_m2} (ref >59)
GFR, EST AFRICAN AMERICAN: 96 mL/min/{1.73_m2} (ref >59)
GLUCOSE: 71 mg/dL (ref 65–99)
Globulin, Total: 3.4 g/dL (ref 1.5–4.5)
Potassium: 3.8 mmol/L (ref 3.5–5.2)
Sodium: 142 mmol/L (ref 134–144)
TOTAL PROTEIN: 7.9 g/dL (ref 6.0–8.5)

## 2018-03-14 LAB — LIPID PANEL
CHOL/HDL RATIO: 3.7 ratio (ref 0.0–4.4)
Cholesterol, Total: 234 mg/dL — ABNORMAL HIGH (ref 100–199)
HDL: 63 mg/dL (ref >39)
LDL Calculated: 144 mg/dL — ABNORMAL HIGH (ref 0–99)
Triglycerides: 135 mg/dL (ref 0–149)
VLDL Cholesterol Cal: 27 mg/dL (ref 5–40)

## 2018-03-14 LAB — VITAMIN D 25 HYDROXY (VIT D DEFICIENCY, FRACTURES): Vit D, 25-Hydroxy: 32.9 ng/mL (ref 30.0–100.0)

## 2018-03-14 LAB — T4, FREE: Free T4: 1.09 ng/dL (ref 0.82–1.77)

## 2018-03-15 NOTE — Progress Notes (Signed)
Your blood count is normal.  Your thyroid function is normal. Your LDL, bad cholesterol is 144. Ideally, this should be less than 100.  I would like to start you on medication while you work on diet and exercise. Be sure to avoid fried foods and exercise no less than five days weekly.  Your liver and kidney function are stable. Your vitamin D level is on the low end. Please start vit d3 2000 unit capsules once daily. (kw-pls send rx - be sure in gel cap form).  If you are already taking vit d, what is the dose?

## 2018-03-19 ENCOUNTER — Other Ambulatory Visit: Payer: Self-pay | Admitting: Nurse Practitioner

## 2018-03-27 ENCOUNTER — Other Ambulatory Visit: Payer: Self-pay | Admitting: Nurse Practitioner

## 2018-03-28 DIAGNOSIS — Z86000 Personal history of in-situ neoplasm of breast: Secondary | ICD-10-CM | POA: Diagnosis not present

## 2018-03-28 DIAGNOSIS — Z803 Family history of malignant neoplasm of breast: Secondary | ICD-10-CM | POA: Diagnosis not present

## 2018-03-30 DIAGNOSIS — M79641 Pain in right hand: Secondary | ICD-10-CM | POA: Diagnosis not present

## 2018-04-10 DIAGNOSIS — M0589 Other rheumatoid arthritis with rheumatoid factor of multiple sites: Secondary | ICD-10-CM | POA: Diagnosis not present

## 2018-04-10 DIAGNOSIS — Z79899 Other long term (current) drug therapy: Secondary | ICD-10-CM | POA: Diagnosis not present

## 2018-04-10 DIAGNOSIS — Z13820 Encounter for screening for osteoporosis: Secondary | ICD-10-CM | POA: Diagnosis not present

## 2018-04-14 ENCOUNTER — Other Ambulatory Visit: Payer: Self-pay

## 2018-04-14 ENCOUNTER — Other Ambulatory Visit: Payer: Self-pay | Admitting: Internal Medicine

## 2018-04-14 ENCOUNTER — Encounter: Payer: Self-pay | Admitting: Internal Medicine

## 2018-04-21 DIAGNOSIS — M859 Disorder of bone density and structure, unspecified: Secondary | ICD-10-CM | POA: Diagnosis not present

## 2018-04-21 DIAGNOSIS — Z6829 Body mass index (BMI) 29.0-29.9, adult: Secondary | ICD-10-CM | POA: Diagnosis not present

## 2018-04-21 DIAGNOSIS — B354 Tinea corporis: Secondary | ICD-10-CM | POA: Diagnosis not present

## 2018-04-21 DIAGNOSIS — Z01419 Encounter for gynecological examination (general) (routine) without abnormal findings: Secondary | ICD-10-CM | POA: Diagnosis not present

## 2018-05-11 ENCOUNTER — Ambulatory Visit (INDEPENDENT_AMBULATORY_CARE_PROVIDER_SITE_OTHER): Payer: Medicare Other | Admitting: Podiatry

## 2018-05-11 ENCOUNTER — Encounter: Payer: Self-pay | Admitting: Podiatry

## 2018-05-11 DIAGNOSIS — L84 Corns and callosities: Secondary | ICD-10-CM

## 2018-05-11 DIAGNOSIS — M79672 Pain in left foot: Secondary | ICD-10-CM

## 2018-05-11 NOTE — Progress Notes (Signed)
Subjective: Evelyn Orr presents today with painful callus left foot that she cannot trim  and which interferes with daily activities.  Pain is aggravated when weightbearing with and without shoe gear.   Current Outpatient Medications:  .  anastrozole (ARIMIDEX) 1 MG tablet, Take 1 mg by mouth daily., Disp: , Rfl:  .  Azelastine HCl 0.15 % SOLN, 1 SPRAY EACH NOSTRIL DAILY AS NEEDED FOR NASAL CONGESTION, Disp: , Rfl: 1 .  benzonatate (TESSALON) 100 MG capsule, TAKE 1 CAPSULE BY MOUTH TWICE A DAY, Disp: 30 capsule, Rfl: 1 .  Cholecalciferol (VITAMIN D) 10 MCG/ML LIQD, Vitamin D, Disp: , Rfl:  .  Cholecalciferol (VITAMIN D-3) 5000 units TABS, Take 5,000 Units by mouth daily., Disp: , Rfl:  .  clotrimazole-betamethasone (LOTRISONE) cream, clotrimazole-betamethasone 1 %-0.05 % topical cream  APPLY TO AFFECTED AND SURROUNDING AREAS OF SKIN 2 TIMES A DAY, MORNING AND EVENING FOR 2 WEEKS, Disp: , Rfl:  .  dexlansoprazole (DEXILANT) 60 MG capsule, Take 60 mg by mouth daily., Disp: , Rfl:  .  levothyroxine (SYNTHROID, LEVOTHROID) 88 MCG tablet, TAKE 1 TABLET (88MCG) BY ORAL ROUTE EVERY DAY, Disp: 90 tablet, Rfl: 2 .  methotrexate (RHEUMATREX) 2.5 MG tablet, Take 2.5 mg by mouth once a week. 6 tablets weekly, Disp: , Rfl: 1 .  pantoprazole (PROTONIX) 40 MG tablet, TAKE 1 TABLET (40 MG TOTAL) BY MOUTH DAILY. TAKE 30-60 MIN BEFORE FIRST MEAL., Disp: 90 tablet, Rfl: 0  No Known Allergies  Objective:  Vascular Examination: Capillary refill time immediate x 10 digits Dorsalis pedis and Posterior tibial pulses palpable b/l Digital hair present x 10 digits Skin temperature gradient WNL b/l  Dermatological Examination: Skin with normal turgor, texture and tone b/l  Toenails 1-5 b/l well maintained.  Hyperkeratotic lesion noted submetatarsal head 5 left foot.  No open wounds.  No interdigital macerations.  Musculoskeletal: Muscle strength 5/5 to all LE muscle  groups  Neurological: Sensation intact with 10 gram monofilament. Vibratory sensation intact.  Assessment: Painful callus submet head 5 left foot  Plan: 1. Callus pared submet head 5 left foot without incident. Smoothed with burr. 2. Patient to continue soft, supportive shoe gear 3. Patient to report any pedal injuries to medical professional immediately. 4. Follow up 3 months. Patient/POA to call should there be a concern in the interim.

## 2018-05-11 NOTE — Patient Instructions (Signed)
Corns and Calluses Corns are small areas of thickened skin that occur on the top, sides, or tip of a toe. They contain a cone-shaped core with a point that can press on a nerve below. This causes pain. Calluses are areas of thickened skin that can occur anywhere on the body including hands, fingers, palms, soles of the feet, and heels.Calluses are usually larger than corns. What are the causes? Corns and calluses are caused by rubbing (friction) or pressure, such as from shoes that are too tight or do not fit properly. What increases the risk? Corns are more likely to develop in people who have toe deformities, such as hammer toes. Since calluses can occur with friction to any area of the skin, calluses are more likely to develop in people who:  Work with their hands.  Wear shoes that fit poorly, shoes that are too tight, or shoes that are high-heeled.  Have toes deformities.  What are the signs or symptoms? Symptoms of a corn or callus include:  A hard growth on the skin.  Pain or tenderness under the skin.  Redness and swelling.  Increased discomfort while wearing tight-fitting shoes.  How is this diagnosed? Corns and calluses may be diagnosed with a medical history and physical exam. How is this treated? Corns and calluses may be treated with:  Removing the cause of the friction or pressure. This may include: ? Changing your shoes. ? Wearing shoe inserts (orthotics) or other protective layers in your shoes, such as a corn pad. ? Wearing gloves.  Medicines to help soften skin in the hardened, thickened areas.  Reducing the size of the corn or callus by removing the dead layers of skin.  Antibiotic medicines to treat infection.  Surgery, if a toe deformity is the cause.  Follow these instructions at home:  Take medicines only as directed by your health care provider.  If you were prescribed an antibiotic, finish all of it even if you start to feel better.  Wear  shoes that fit well. Avoid wearing high-heeled shoes and shoes that are too tight or too loose.  Wear any padding, protective layers, gloves, or orthotics as directed by your health care provider.  Soak your hands or feet and then use a file or pumice stone to soften your corn or callus. Do this as directed by your health care provider.  Check your corn or callus every day for signs of infection. Watch for: ? Redness, swelling, or pain. ? Fluid, blood, or pus. Contact a health care provider if:  Your symptoms do not improve with treatment.  You have increased redness, swelling, or pain at the site of your corn or callus.  You have fluid, blood, or pus coming from your corn or callus.  You have new symptoms. This information is not intended to replace advice given to you by your health care provider. Make sure you discuss any questions you have with your health care provider. Document Released: 02/21/2004 Document Revised: 12/05/2015 Document Reviewed: 05/13/2014 Elsevier Interactive Patient Education  2018 [employer] Inc[employer]  

## 2018-05-31 DIAGNOSIS — M25562 Pain in left knee: Secondary | ICD-10-CM | POA: Diagnosis not present

## 2018-06-06 DIAGNOSIS — D0510 Intraductal carcinoma in situ of unspecified breast: Secondary | ICD-10-CM | POA: Diagnosis not present

## 2018-06-08 DIAGNOSIS — M8589 Other specified disorders of bone density and structure, multiple sites: Secondary | ICD-10-CM | POA: Diagnosis not present

## 2018-06-08 DIAGNOSIS — Z853 Personal history of malignant neoplasm of breast: Secondary | ICD-10-CM | POA: Diagnosis not present

## 2018-06-08 DIAGNOSIS — Z96651 Presence of right artificial knee joint: Secondary | ICD-10-CM | POA: Diagnosis not present

## 2018-06-08 LAB — HM DEXA SCAN: HM DEXA SCAN: POSITIVE

## 2018-06-14 ENCOUNTER — Telehealth: Payer: Self-pay

## 2018-06-14 NOTE — Telephone Encounter (Signed)
-----   Message from [name], MD sent at 06/13/2018  7:52 PM EST ----- Pls let her know dexa scan showed Osteopenia - thinning of the bone. Important ot exercise 3-4 days per week and to take calcium/vit d supplements.

## 2018-06-14 NOTE — Telephone Encounter (Signed)
Left the pt message to call the office back.

## 2018-06-14 NOTE — Telephone Encounter (Signed)
Patient notified of dexascan results. 

## 2018-06-16 ENCOUNTER — Encounter: Payer: Self-pay | Admitting: Internal Medicine

## 2018-06-21 DIAGNOSIS — H43391 Other vitreous opacities, right eye: Secondary | ICD-10-CM | POA: Diagnosis not present

## 2018-06-21 DIAGNOSIS — H353121 Nonexudative age-related macular degeneration, left eye, early dry stage: Secondary | ICD-10-CM | POA: Diagnosis not present

## 2018-06-21 DIAGNOSIS — H43811 Vitreous degeneration, right eye: Secondary | ICD-10-CM | POA: Diagnosis not present

## 2018-06-21 DIAGNOSIS — Z9889 Other specified postprocedural states: Secondary | ICD-10-CM | POA: Diagnosis not present

## 2018-07-03 ENCOUNTER — Ambulatory Visit (INDEPENDENT_AMBULATORY_CARE_PROVIDER_SITE_OTHER): Payer: Medicare Other | Admitting: Internal Medicine

## 2018-07-03 ENCOUNTER — Encounter: Payer: Self-pay | Admitting: Internal Medicine

## 2018-07-03 ENCOUNTER — Other Ambulatory Visit: Payer: Self-pay

## 2018-07-03 VITALS — BP 118/62 | HR 87 | Temp 99.1°F | Ht 61.2 in | Wt 164.0 lb

## 2018-07-03 DIAGNOSIS — J029 Acute pharyngitis, unspecified: Secondary | ICD-10-CM | POA: Diagnosis not present

## 2018-07-03 DIAGNOSIS — M25541 Pain in joints of right hand: Secondary | ICD-10-CM | POA: Diagnosis not present

## 2018-07-03 DIAGNOSIS — M25571 Pain in right ankle and joints of right foot: Secondary | ICD-10-CM

## 2018-07-03 LAB — POCT RAPID STREP A (OFFICE): Rapid Strep A Screen: NEGATIVE

## 2018-07-03 MED ORDER — PREDNISONE 10 MG PO TABS: ORAL_TABLET | ORAL | 0 refills | Status: DC

## 2018-07-03 NOTE — Progress Notes (Signed)
Subjective:     Patient ID: Evelyn Orr , female    DOB: [birth year] , 73 y.o.   MRN: [id number]   Chief Complaint  Patient presents with  . Sore Throat    week/no cough/ trouble swollowing   . Joint Swelling    right hand and right foot     HPI 1- Pt is here due to having a ST and no other respiratory symptoms x 1 week off and on and moves from side to side. She is just dealing with her allergies and has post nasal drainage and has been taking her allergy medication. Denies rhinitis or cough. She has not done any saline nose rinses, only throat. Also has felt pain in her L ear when she swallows only.   2- Has been having R hand and R foot swelling x 2 days ago. Thinks it's her arthritis. She had another flair in December, but prior to that it had been a long time.       Past Medical History:  Diagnosis Date  . Arthritis   . Breast cancer ([place])   . Breast cancer of upper-outer quadrant of right female breast ([place]) 03/20/2015  . Cough   . Hypothyroidism   . Nasal congestion   . PONV (postoperative nausea and vomiting)   . Rheumatoid arthritis(714.0)   . Seasonal allergies   . Thyroid disease    hypothyroidism     Family History  Problem Relation Age of Onset  . Heart disease Father   . Heart attack Father   . Hypertension Mother   . Breast cancer Paternal Aunt        dx. 61 or younger  . Colon cancer Paternal Uncle   . Prostate cancer Maternal Uncle   . Other Sister        high breast density  . Prostate cancer Brother 59       unknown Gleason  . Prostate cancer Maternal Grandfather        dx. older age  . Colon polyps Son 8       colonoscopy for unspecified reason; found some colon polyps, has not had one since  . Prostate cancer Maternal Uncle   . Prostate cancer Maternal Uncle   . Colon cancer Other   . Cancer Other        MGM's sister  . Breast cancer Cousin        4 maternal cousin had breast cancer   . Rectal cancer Neg Hx   . Stomach cancer Neg  Hx      Current Outpatient Medications:  .  anastrozole (ARIMIDEX) 1 MG tablet, Take 1 mg by mouth daily., Disp: , Rfl:  .  Azelastine HCl 0.15 % SOLN, 1 SPRAY EACH NOSTRIL DAILY AS NEEDED FOR NASAL CONGESTION, Disp: , Rfl: 1 .  benzonatate (TESSALON) 100 MG capsule, TAKE 1 CAPSULE BY MOUTH TWICE A DAY, Disp: 30 capsule, Rfl: 1 .  Cholecalciferol (VITAMIN D-3) 5000 units TABS, Take 5,000 Units by mouth daily., Disp: , Rfl:  .  clotrimazole-betamethasone (LOTRISONE) cream, clotrimazole-betamethasone 1 %-0.05 % topical cream  APPLY TO AFFECTED AND SURROUNDING AREAS OF SKIN 2 TIMES A DAY, MORNING AND EVENING FOR 2 WEEKS, Disp: , Rfl:  .  levothyroxine (SYNTHROID, LEVOTHROID) 88 MCG tablet, TAKE 1 TABLET (88MCG) BY ORAL ROUTE EVERY DAY, Disp: 90 tablet, Rfl: 2 .  methotrexate (RHEUMATREX) 2.5 MG tablet, Take 2.5 mg by mouth once a week. 6 tablets weekly, Disp: , Rfl: 1 .  pantoprazole (PROTONIX) 40 MG tablet, TAKE 1 TABLET (40 MG TOTAL) BY MOUTH DAILY. TAKE 30-60 MIN BEFORE FIRST MEAL., Disp: 90 tablet, Rfl: 0 .  Cholecalciferol (VITAMIN D) 10 MCG/ML LIQD, Vitamin D, Disp: , Rfl:  .  dexlansoprazole (DEXILANT) 60 MG capsule, Take 60 mg by mouth daily., Disp: , Rfl:    No Known Allergies   Review of Systems  Constitutional: Negative for chills, diaphoresis and fever.  HENT: Positive for ear pain and sore throat. Negative for congestion, ear discharge, facial swelling, sinus pressure, sinus pain, trouble swallowing and voice change.   Eyes: Negative for discharge.  Respiratory: Negative for cough, shortness of breath and wheezing.   Musculoskeletal: Positive for arthralgias and joint swelling.       See HPI  Skin: Negative for rash.  Neurological: Negative for dizziness.  Hematological: Negative for adenopathy.    Today's Vitals   07/03/18 1001  BP: 118/62  Pulse: 87  Temp: 99.1 F (37.3 C)  TempSrc: Oral  SpO2: 97%  Weight: 164 lb (74.4 kg)  Height: 5' 1.2" (1.554 m)   Body mass  index is 30.79 kg/m.   Objective:  Physical Exam Vitals signs and nursing note reviewed.  Constitutional:      General: She is not in acute distress.    Appearance: She is well-developed. She is not toxic-appearing.     Comments: Who clears her throat several times.   HENT:     Head: Normocephalic.     Right Ear: Tympanic membrane and ear canal normal. No drainage, swelling or tenderness.     Left Ear: Tympanic membrane and ear canal normal. No drainage, swelling or tenderness.     Nose: No congestion or rhinorrhea.     Mouth/Throat:     Mouth: Mucous membranes are moist. No oral lesions.     Pharynx: Uvula midline. No pharyngeal swelling, oropharyngeal exudate, posterior oropharyngeal erythema or uvula swelling.  Eyes:     Conjunctiva/sclera: Conjunctivae normal.  Neck:     Musculoskeletal: Neck supple.  Cardiovascular:     Rate and Rhythm: Normal rate and regular rhythm.     Pulses:          Dorsalis pedis pulses are 2+ on the right side.       Posterior tibial pulses are 2+ on the right side.  Pulmonary:     Effort: Pulmonary effort is normal.     Breath sounds: Normal breath sounds.  Musculoskeletal:     Right hand: She exhibits tenderness, bony tenderness and swelling.       Hands:     Right foot: Normal range of motion. No bunion.       Feet:  Feet:     Right foot:     Amputation: (Has soft tissue swelling anterior to the medial malleolus which is tender. )    Skin integrity: Skin integrity normal.     Toenail Condition: Right toenails are normal.  Lymphadenopathy:     Cervical: No cervical adenopathy.  Skin:    General: Skin is warm and dry.     Capillary Refill: Capillary refill takes less than 2 seconds.     Findings: No rash.  Neurological:     General: No focal deficit present.     Mental Status: She is alert and oriented to person, place, and time.  Psychiatric:        Mood and Affect: Mood normal.        Behavior: Behavior normal.  Assessment  And Plan:    1. Pharyngitis, unspecified etiology- seems from PND. She was advised to so saline nose rinses. FU if she gets worse.  - POCT rapid strep A- negative  2. Arthralgia of hand, right- acute. I will have her try short course of prednisone as noted.   3. Arthralgia of right foot- acute.  I will have her try short course of prednisone as noted.  She has not seen her Rheumatologist in a long time and is planning to make an apt.      Garima Chronis RODRIGUEZ-SOUTHWORTH, PA-C

## 2018-07-03 NOTE — Patient Instructions (Signed)
Do saline rinses with Netie pot twice a day for 5- 7 days. Seems the post nasal drainage is irritating your throat.    Pharyngitis  Pharyngitis is a sore throat (pharynx). This is when there is redness, pain, and swelling in your throat. Most of the time, this condition gets better on its own. In some cases, you may need medicine. Follow these instructions at home:  Take over-the-counter and prescription medicines only as told by your doctor. ? If you were prescribed an antibiotic medicine, take it as told by your doctor. Do not stop taking the antibiotic even if you start to feel better. ? Do not give children aspirin. Aspirin has been linked to Reye syndrome.  Drink enough water and fluids to keep your pee (urine) clear or pale yellow.  Get a lot of rest.  Rinse your mouth (gargle) with a salt-water mixture 3-4 times a day or as needed. To make a salt-water mixture, completely dissolve -1 tsp of salt in 1 cup of warm water.  If your doctor approves, you may use throat lozenges or sprays to soothe your throat. Contact a doctor if:  You have large, tender lumps in your neck.  You have a rash.  You cough up green, yellow-brown, or bloody spit. Get help right away if:  You have a stiff neck.  You drool or cannot swallow liquids.  You cannot drink or take medicines without throwing up.  You have very bad pain that does not go away with medicine.  You have problems breathing, and it is not from a stuffy nose.  You have new pain and swelling in your knees, ankles, wrists, or elbows. Summary  Pharyngitis is a sore throat (pharynx). This is when there is redness, pain, and swelling in your throat.  If you were prescribed an antibiotic medicine, take it as told by your doctor. Do not stop taking the antibiotic even if you start to feel better.  Most of the time, pharyngitis gets better on its own. Sometimes, you may need medicine. This information is not intended to replace  advice given to you by your health care provider. Make sure you discuss any questions you have with your health care provider. Document Released: 11/03/2007 Document Revised: 06/22/2016 Document Reviewed: 06/22/2016 Elsevier Interactive Patient Education  2019 [employer].

## 2018-07-05 DIAGNOSIS — M25471 Effusion, right ankle: Secondary | ICD-10-CM | POA: Diagnosis not present

## 2018-07-05 DIAGNOSIS — M25571 Pain in right ankle and joints of right foot: Secondary | ICD-10-CM | POA: Diagnosis not present

## 2018-07-05 DIAGNOSIS — M0589 Other rheumatoid arthritis with rheumatoid factor of multiple sites: Secondary | ICD-10-CM | POA: Diagnosis not present

## 2018-07-05 DIAGNOSIS — Z13820 Encounter for screening for osteoporosis: Secondary | ICD-10-CM | POA: Diagnosis not present

## 2018-07-05 DIAGNOSIS — M79643 Pain in unspecified hand: Secondary | ICD-10-CM | POA: Diagnosis not present

## 2018-07-05 DIAGNOSIS — M130 Polyarthritis, unspecified: Secondary | ICD-10-CM | POA: Diagnosis not present

## 2018-07-05 DIAGNOSIS — Z79899 Other long term (current) drug therapy: Secondary | ICD-10-CM | POA: Diagnosis not present

## 2018-07-05 DIAGNOSIS — M25539 Pain in unspecified wrist: Secondary | ICD-10-CM | POA: Diagnosis not present

## 2018-07-05 DIAGNOSIS — M25439 Effusion, unspecified wrist: Secondary | ICD-10-CM | POA: Diagnosis not present

## 2018-07-06 DIAGNOSIS — M0589 Other rheumatoid arthritis with rheumatoid factor of multiple sites: Secondary | ICD-10-CM | POA: Diagnosis not present

## 2018-07-06 DIAGNOSIS — Z79899 Other long term (current) drug therapy: Secondary | ICD-10-CM | POA: Diagnosis not present

## 2018-07-07 ENCOUNTER — Other Ambulatory Visit: Payer: Self-pay | Admitting: Internal Medicine

## 2018-07-20 DIAGNOSIS — N9089 Other specified noninflammatory disorders of vulva and perineum: Secondary | ICD-10-CM | POA: Diagnosis not present

## 2018-07-20 DIAGNOSIS — L309 Dermatitis, unspecified: Secondary | ICD-10-CM | POA: Diagnosis not present

## 2018-08-07 ENCOUNTER — Other Ambulatory Visit: Payer: Self-pay | Admitting: Internal Medicine

## 2018-08-10 ENCOUNTER — Ambulatory Visit (INDEPENDENT_AMBULATORY_CARE_PROVIDER_SITE_OTHER): Payer: Medicare Other | Admitting: Podiatry

## 2018-08-10 ENCOUNTER — Encounter: Payer: Self-pay | Admitting: Podiatry

## 2018-08-10 DIAGNOSIS — L84 Corns and callosities: Secondary | ICD-10-CM | POA: Diagnosis not present

## 2018-08-10 NOTE — Progress Notes (Signed)
Subjective:  Patient presents to clinic with cc of  painful callus plantar aspect of the left foot which is aggravated when weightbearing with and without shoe gear.  This pain limits her daily activities. Pain symptoms resolve with periodic professional debridement.  [name], MD is her PCP.  Last visit March 13, 2018.   Current Outpatient Medications:  .  anastrozole (ARIMIDEX) 1 MG tablet, Take 1 mg by mouth daily., Disp: , Rfl:  .  Azelastine HCl 0.15 % SOLN, 1 SPRAY EACH NOSTRIL DAILY AS NEEDED FOR NASAL CONGESTION, Disp: , Rfl: 1 .  benzonatate (TESSALON) 100 MG capsule, TAKE 1 CAPSULE BY MOUTH TWICE A DAY, Disp: 30 capsule, Rfl: 1 .  Cholecalciferol (VITAMIN D) 10 MCG/ML LIQD, Vitamin D, Disp: , Rfl:  .  Cholecalciferol (VITAMIN D-3) 5000 units TABS, Take 5,000 Units by mouth daily., Disp: , Rfl:  .  clotrimazole-betamethasone (LOTRISONE) cream, clotrimazole-betamethasone 1 %-0.05 % topical cream  APPLY TO AFFECTED AND SURROUNDING AREAS OF SKIN 2 TIMES A DAY, MORNING AND EVENING FOR 2 WEEKS, Disp: , Rfl:  .  dexlansoprazole (DEXILANT) 60 MG capsule, Take 60 mg by mouth daily., Disp: , Rfl:  .  levothyroxine (SYNTHROID, LEVOTHROID) 88 MCG tablet, TAKE 1 TABLET (88MCG) BY ORAL ROUTE EVERY DAY, Disp: 90 tablet, Rfl: 2 .  methotrexate (RHEUMATREX) 2.5 MG tablet, Take 2.5 mg by mouth once a week. 6 tablets weekly, Disp: , Rfl: 1 .  pantoprazole (PROTONIX) 40 MG tablet, TAKE 1 TABLET (40 MG TOTAL) BY MOUTH DAILY. TAKE 30-60 MIN BEFORE FIRST MEAL., Disp: 90 tablet, Rfl: 0 .  predniSONE (DELTASONE) 10 MG tablet, 20 qd x 3 days, then 1 qd x 3 days, then 1/2 qd  til cone., Disp: 15 tablet, Rfl: 0 .  Vitamin D, Ergocalciferol, (DRISDOL) 1.25 MG (50000 UT) CAPS capsule, TAKE 1 CAPSULE BY MOUTH 2 TIMES EVERY WEEK, Disp: 8 capsule, Rfl: 0   No Known Allergies   Objective:  Physical Examination: Neurovascular status intact b/l feet.  Muscle strength 5/5 to all muscle groups b/l LE     Pedal skin with normal turgor, texture, and tone bilaterally. Hyperkeratotic lesion noted submetatarsal head 5 left foot.  No erythema, no edema, no drainage, no flocculence noted.  Assessment: Callus submetatarsal head 5 left foot  Plan: Painful hyperkeratotic lesion submetatarsal head 5 left foot pared with sterile scalpel blade without incident. Continue soft, supportive shoe gear daily. Report any pedal injuries to medical professional Follow up 3 months. Call our office should there be any questions/concerns in the interim.

## 2018-08-10 NOTE — Patient Instructions (Signed)

## 2018-09-22 ENCOUNTER — Other Ambulatory Visit: Payer: Self-pay

## 2018-09-22 ENCOUNTER — Ambulatory Visit (INDEPENDENT_AMBULATORY_CARE_PROVIDER_SITE_OTHER): Payer: BC Managed Care – PPO | Admitting: Podiatry

## 2018-09-22 ENCOUNTER — Encounter: Payer: Self-pay | Admitting: Podiatry

## 2018-09-22 VITALS — Temp 97.2°F

## 2018-09-22 DIAGNOSIS — L84 Corns and callosities: Secondary | ICD-10-CM

## 2018-09-22 NOTE — Progress Notes (Signed)
Subjective:  Patient presents to clinic with cc of  painful callus left foot which is aggravated when weightbearing with and without shoe gear.  This pain limits her daily activities such as exercising. She walks about 3 miles/day.  Pain symptoms resolve with periodic professional debridement.  [name], MD is her PCP. Last visit was 03/13/2018.   Current Outpatient Medications:  .  anastrozole (ARIMIDEX) 1 MG tablet, Take 1 mg by mouth daily., Disp: , Rfl:  .  Azelastine HCl 0.15 % SOLN, 1 SPRAY EACH NOSTRIL DAILY AS NEEDED FOR NASAL CONGESTION, Disp: , Rfl: 1 .  benzonatate (TESSALON) 100 MG capsule, TAKE 1 CAPSULE BY MOUTH TWICE A DAY, Disp: 30 capsule, Rfl: 1 .  Cholecalciferol (VITAMIN D) 10 MCG/ML LIQD, Vitamin D, Disp: , Rfl:  .  Cholecalciferol (VITAMIN D-3) 5000 units TABS, Take 5,000 Units by mouth daily., Disp: , Rfl:  .  clotrimazole-betamethasone (LOTRISONE) cream, clotrimazole-betamethasone 1 %-0.05 % topical cream  APPLY TO AFFECTED AND SURROUNDING AREAS OF SKIN 2 TIMES A DAY, MORNING AND EVENING FOR 2 WEEKS, Disp: , Rfl:  .  dexlansoprazole (DEXILANT) 60 MG capsule, Take 60 mg by mouth daily., Disp: , Rfl:  .  levothyroxine (SYNTHROID, LEVOTHROID) 88 MCG tablet, TAKE 1 TABLET (88MCG) BY ORAL ROUTE EVERY DAY, Disp: 90 tablet, Rfl: 2 .  methotrexate (RHEUMATREX) 2.5 MG tablet, Take 2.5 mg by mouth once a week. 6 tablets weekly, Disp: , Rfl: 1 .  pantoprazole (PROTONIX) 40 MG tablet, TAKE 1 TABLET (40 MG TOTAL) BY MOUTH DAILY. TAKE 30-60 MIN BEFORE FIRST MEAL., Disp: 90 tablet, Rfl: 0 .  predniSONE (DELTASONE) 10 MG tablet, 20 qd x 3 days, then 1 qd x 3 days, then 1/2 qd  til cone., Disp: 15 tablet, Rfl: 0 .  valACYclovir (VALTREX) 500 MG tablet, valacyclovir 500 mg tablet  TAKE 1 TABLET BY MOUTH TWICE A DAY FOR 5 DAYS, Disp: , Rfl:  .  Vitamin D, Ergocalciferol, (DRISDOL) 1.25 MG (50000 UT) CAPS capsule, TAKE 1 CAPSULE BY MOUTH 2 TIMES EVERY WEEK, Disp: 8 capsule, Rfl: 0    No Known Allergies   Objective:  Physical Examination: Neurovascular status intact b/l feet.  Muscle strength 5/5 to all muscle groups b/l LE.  Pedal skin with normal turgor, texture and tone b/l.  Toenails adequate length and well maintained b/l 1-5.  Hyperkeratotic lesion noted submetatarsal head 5 left foot. No erythema, no edema, no drainage and no flocculence noted.  Assessment: Painful callus submetatarsal head 5 left foot  Plan: 1.  Calluses pared submetatarsal head(s) 5 left foot utilizing sterile scalpel blade without incident. 2.  Continue soft, supportive shoe gear daily. 3.  Report any pedal injuries to medical professional. 4.  Follow up 3 months. 5.  Call our office should there be any questions/concerns in the interim.

## 2018-09-22 NOTE — Patient Instructions (Signed)
Onychomycosis/Fungal Toenails  WHAT IS IT? An infection that lies within the keratin of your nail plate that is caused by a fungus.  WHY ME? Fungal infections affect all ages, sexes, races, and creeds.  There may be many factors that predispose you to a fungal infection such as age, coexisting medical conditions such as diabetes, or an autoimmune disease; stress, medications, fatigue, genetics, etc.  Bottom line: fungus thrives in a warm, moist environment and your shoes offer such a location.  IS IT CONTAGIOUS? Theoretically, yes.  You do not want to share shoes, nail clippers or files with someone who has fungal toenails.  Walking around barefoot in the same room or sleeping in the same bed is unlikely to transfer the organism.  It is important to realize, however, that fungus can spread easily from one nail to the next on the same foot.  HOW DO WE TREAT THIS?  There are several ways to treat this condition.  Treatment may depend on many factors such as age, medications, pregnancy, liver and kidney conditions, etc.  It is best to ask your doctor which options are available to you.  1. No treatment.   Unlike many other medical concerns, you can live with this condition.  However for many people this can be a painful condition and may lead to ingrown toenails or a bacterial infection.  It is recommended that you keep the nails cut short to help reduce the amount of fungal nail. 2. Topical treatment.  These range from herbal remedies to prescription strength nail lacquers.  About 40-50% effective, topicals require twice daily application for approximately 9 to 12 months or until an entirely new nail has grown out.  The most effective topicals are medical grade medications available through physicians offices. 3. Oral antifungal medications.  With an 80-90% cure rate, the most common oral medication requires 3 to 4 months of therapy and stays in your system for a year as the new nail grows out.  Oral  antifungal medications do require blood work to make sure it is a safe drug for you.  A liver function panel will be performed prior to starting the medication and after the first month of treatment.  It is important to have the blood work performed to avoid any harmful side effects.  In general, this medication safe but blood work is required. 4. Laser Therapy.  This treatment is performed by applying a specialized laser to the affected nail plate.  This therapy is noninvasive, fast, and non-painful.  It is not covered by insurance and is therefore, out of pocket.  The results have been very good with a 80-95% cure rate.  [facility] is the only practice in the area to offer this therapy. 5. Permanent Nail Avulsion.  Removing the entire nail so that a new nail will not grow back.  Corns and Calluses Corns are small areas of thickened skin that occur on the top, sides, or tip of a toe. They contain a cone-shaped core with a point that can press on a nerve below. This causes pain.  Calluses are areas of thickened skin that can occur anywhere on the body, including the hands, fingers, palms, soles of the feet, and heels. Calluses are usually larger than corns. What are the causes? Corns and calluses are caused by rubbing (friction) or pressure, such as from shoes that are too tight or do not fit properly. What increases the risk? Corns are more likely to develop in people   who have misshapen toes (toe deformities), such as hammer toes. Calluses can occur with friction to any area of the skin. They are more likely to develop in people who:  Work with their hands.  Wear shoes that fit poorly, are too tight, or are high-heeled.  Have toe deformities. What are the signs or symptoms? Symptoms of a corn or callus include:  A hard growth on the skin.  Pain or tenderness under the skin.  Redness and swelling.  Increased discomfort while wearing tight-fitting shoes, if your feet are  affected. If a corn or callus becomes infected, symptoms may include:  Redness and swelling that gets worse.  Pain.  Fluid, blood, or pus draining from the corn or callus. How is this diagnosed? Corns and calluses may be diagnosed based on your symptoms, your medical history, and a physical exam. How is this treated? Treatment for corns and calluses may include:  Removing the cause of the friction or pressure. This may involve: ? Changing your shoes. ? Wearing shoe inserts (orthotics) or other protective layers in your shoes, such as a corn pad. ? Wearing gloves.  Applying medicine to the skin (topical medicine) to help soften skin in the hardened, thickened areas.  Removing layers of dead skin with a file to reduce the size of the corn or callus.  Removing the corn or callus with a scalpel or laser.  Taking antibiotic medicines, if your corn or callus is infected.  Having surgery, if a toe deformity is the cause. Follow these instructions at home:   Take over-the-counter and prescription medicines only as told by your health care provider.  If you were prescribed an antibiotic, take it as told by your health care provider. Do not stop taking it even if your condition starts to improve.  Wear shoes that fit well. Avoid wearing high-heeled shoes and shoes that are too tight or too loose.  Wear any padding, protective layers, gloves, or orthotics as told by your health care provider.  Soak your hands or feet and then use a file or pumice stone to soften your corn or callus. Do this as told by your health care provider.  Check your corn or callus every day for symptoms of infection. Contact a health care provider if you:  Notice that your symptoms do not improve with treatment.  Have redness or swelling that gets worse.  Notice that your corn or callus becomes painful.  Have fluid, blood, or pus coming from your corn or callus.  Have new symptoms. Summary  Corns are  small areas of thickened skin that occur on the top, sides, or tip of a toe.  Calluses are areas of thickened skin that can occur anywhere on the body, including the hands, fingers, palms, and soles of the feet. Calluses are usually larger than corns.  Corns and calluses are caused by rubbing (friction) or pressure, such as from shoes that are too tight or do not fit properly.  Treatment may include wearing any padding, protective layers, gloves, or orthotics as told by your health care provider. This information is not intended to replace advice given to you by your health care provider. Make sure you discuss any questions you have with your health care provider. Document Released: 02/21/2004 Document Revised: 03/30/2017 Document Reviewed: 03/30/2017 Elsevier Interactive Patient Education  2019 [employer]  

## 2018-09-24 ENCOUNTER — Encounter: Payer: Self-pay | Admitting: Podiatry

## 2018-09-27 DIAGNOSIS — Z13820 Encounter for screening for osteoporosis: Secondary | ICD-10-CM | POA: Diagnosis not present

## 2018-09-27 DIAGNOSIS — M25471 Effusion, right ankle: Secondary | ICD-10-CM | POA: Diagnosis not present

## 2018-09-27 DIAGNOSIS — M0589 Other rheumatoid arthritis with rheumatoid factor of multiple sites: Secondary | ICD-10-CM | POA: Diagnosis not present

## 2018-09-27 DIAGNOSIS — M130 Polyarthritis, unspecified: Secondary | ICD-10-CM | POA: Diagnosis not present

## 2018-09-27 DIAGNOSIS — Z79899 Other long term (current) drug therapy: Secondary | ICD-10-CM | POA: Diagnosis not present

## 2018-09-27 DIAGNOSIS — M79643 Pain in unspecified hand: Secondary | ICD-10-CM | POA: Diagnosis not present

## 2018-09-27 DIAGNOSIS — M25439 Effusion, unspecified wrist: Secondary | ICD-10-CM | POA: Diagnosis not present

## 2018-09-27 DIAGNOSIS — M25571 Pain in right ankle and joints of right foot: Secondary | ICD-10-CM | POA: Diagnosis not present

## 2018-09-27 DIAGNOSIS — M25539 Pain in unspecified wrist: Secondary | ICD-10-CM | POA: Diagnosis not present

## 2018-11-09 ENCOUNTER — Ambulatory Visit: Payer: Medicare Other | Admitting: Podiatry

## 2018-11-15 ENCOUNTER — Other Ambulatory Visit: Payer: Self-pay | Admitting: Internal Medicine

## 2018-11-15 NOTE — Telephone Encounter (Signed)
Benzonatate refill

## 2018-11-23 ENCOUNTER — Other Ambulatory Visit: Payer: Self-pay | Admitting: Internal Medicine

## 2018-11-24 DIAGNOSIS — M0589 Other rheumatoid arthritis with rheumatoid factor of multiple sites: Secondary | ICD-10-CM | POA: Diagnosis not present

## 2018-11-24 DIAGNOSIS — Z79899 Other long term (current) drug therapy: Secondary | ICD-10-CM | POA: Diagnosis not present

## 2018-11-24 DIAGNOSIS — Z13820 Encounter for screening for osteoporosis: Secondary | ICD-10-CM | POA: Diagnosis not present

## 2018-11-27 DIAGNOSIS — H04123 Dry eye syndrome of bilateral lacrimal glands: Secondary | ICD-10-CM | POA: Diagnosis not present

## 2018-11-27 DIAGNOSIS — H40023 Open angle with borderline findings, high risk, bilateral: Secondary | ICD-10-CM | POA: Diagnosis not present

## 2018-11-27 DIAGNOSIS — H0520 Unspecified exophthalmos: Secondary | ICD-10-CM | POA: Diagnosis not present

## 2018-11-27 DIAGNOSIS — E0500 Thyrotoxicosis with diffuse goiter without thyrotoxic crisis or storm: Secondary | ICD-10-CM | POA: Diagnosis not present

## 2018-11-27 DIAGNOSIS — M0589 Other rheumatoid arthritis with rheumatoid factor of multiple sites: Secondary | ICD-10-CM | POA: Diagnosis not present

## 2018-11-27 DIAGNOSIS — Z79899 Other long term (current) drug therapy: Secondary | ICD-10-CM | POA: Diagnosis not present

## 2018-11-27 LAB — HM DIABETES EYE EXAM

## 2018-12-06 ENCOUNTER — Ambulatory Visit (INDEPENDENT_AMBULATORY_CARE_PROVIDER_SITE_OTHER): Payer: Medicare Other

## 2018-12-06 ENCOUNTER — Ambulatory Visit (INDEPENDENT_AMBULATORY_CARE_PROVIDER_SITE_OTHER): Payer: Medicare Other | Admitting: Podiatry

## 2018-12-06 ENCOUNTER — Encounter: Payer: Self-pay | Admitting: Podiatry

## 2018-12-06 ENCOUNTER — Other Ambulatory Visit: Payer: Self-pay

## 2018-12-06 ENCOUNTER — Other Ambulatory Visit: Payer: Self-pay | Admitting: Podiatry

## 2018-12-06 VITALS — Temp 97.8°F

## 2018-12-06 DIAGNOSIS — M7672 Peroneal tendinitis, left leg: Secondary | ICD-10-CM

## 2018-12-06 DIAGNOSIS — M79672 Pain in left foot: Secondary | ICD-10-CM

## 2018-12-06 DIAGNOSIS — Q828 Other specified congenital malformations of skin: Secondary | ICD-10-CM

## 2018-12-06 DIAGNOSIS — L84 Corns and callosities: Secondary | ICD-10-CM

## 2018-12-06 NOTE — Progress Notes (Addendum)
Subjective: Evelyn Orr presents to clinic with cc of painful callus left foot which is aggravated when weightbearing with and without shoe gear.  This pain limits her daily activities. Pain symptoms resolve with periodic professional debridement.  Patient has new complaint of ankle pain left foot. Pain is located at the lateral aspect of her ankle. She relates no episode of trauma. Onset was gradual. Duration is about one week. She has done nothing to treat her pain.  [name], MD is her PCP.    Current Outpatient Medications:  .  anastrozole (ARIMIDEX) 1 MG tablet, Take 1 mg by mouth daily., Disp: , Rfl:  .  Azelastine HCl 0.15 % SOLN, 1 SPRAY EACH NOSTRIL DAILY AS NEEDED FOR NASAL CONGESTION, Disp: , Rfl: 1 .  benzonatate (TESSALON) 100 MG capsule, TAKE 1 CAPSULE BY MOUTH TWICE A DAY, Disp: 30 capsule, Rfl: 1 .  Cholecalciferol (VITAMIN D) 10 MCG/ML LIQD, Vitamin D, Disp: , Rfl:  .  Cholecalciferol (VITAMIN D-3) 5000 units TABS, Take 5,000 Units by mouth daily., Disp: , Rfl:  .  clotrimazole-betamethasone (LOTRISONE) cream, clotrimazole-betamethasone 1 %-0.05 % topical cream  APPLY TO AFFECTED AND SURROUNDING AREAS OF SKIN 2 TIMES A DAY, MORNING AND EVENING FOR 2 WEEKS, Disp: , Rfl:  .  dexlansoprazole (DEXILANT) 60 MG capsule, Take 60 mg by mouth daily., Disp: , Rfl:  .  levothyroxine (SYNTHROID) 88 MCG tablet, TAKE 1 TABLET BY MOUTH EVERY DAY, Disp: 90 tablet, Rfl: 2 .  methotrexate (RHEUMATREX) 2.5 MG tablet, Take 2.5 mg by mouth once a week. 6 tablets weekly, Disp: , Rfl: 1 .  pantoprazole (PROTONIX) 40 MG tablet, TAKE 1 TABLET (40 MG TOTAL) BY MOUTH DAILY. TAKE 30-60 MIN BEFORE FIRST MEAL., Disp: 90 tablet, Rfl: 0 .  predniSONE (DELTASONE) 10 MG tablet, 20 qd x 3 days, then 1 qd x 3 days, then 1/2 qd  til cone., Disp: 15 tablet, Rfl: 0 .  valACYclovir (VALTREX) 500 MG tablet, valacyclovir 500 mg tablet  TAKE 1 TABLET BY MOUTH TWICE A DAY FOR 5 DAYS, Disp: , Rfl:  .  Vitamin  D, Ergocalciferol, (DRISDOL) 1.25 MG (50000 UT) CAPS capsule, TAKE 1 CAPSULE BY MOUTH 2 TIMES EVERY WEEK, Disp: 8 capsule, Rfl: 0   No Known Allergies   Objective: Vitals:   12/06/18 0858  Temp: 97.8 F (36.6 C)  Physical Examination:  Vascular  Examination: Capillary refill time immediate x 10 digits.  Palpable DP/PT pulses b/l.  Digital hair present b/l.  No edema noted b/l.  Skin temperature gradient WNL b/l.  Dermatological Examination: Skin with normal turgor, texture and tone b/l.  No open wounds b/l.  No interdigital macerations noted b/l.  Elongated, thick, discolored brittle toenails with subungual debris and pain on dorsal palpation of nailbeds 1-5 b/l.  Hyperkeratotic lesion submet head 5 left foot with tenderness to palpation. No edema, no erythema, no drainage, no flocculence.  Musculoskeletal Examination: Muscle strength 5/5 to all muscle groups b/l.  Pain noted along peroneal tendon LLE.   No pain, crepitus or joint discomfort with active/passive ROM.  Neurological Examination: Sensation intact 5/5 b/l with 10 gram monofilament.  Vibratory sensation intact b/l.  Assessment: 1.  Callus submet head 5 left foot 2.  Peroneal tendonitis left foot  Plan: 1.  Callus pared submetatarsal head 5 left foot utilizing sterile scalpel blade without incident. 2.  Dispensed trilock ankle brace for tendinitis. Recommended change in shoe gear to [employer], 600 series or higher, or  Brooks sneaksers.  3.  Continue soft, supportive shoe gear daily. 4.  Report any pedal injuries to medical professional. 5.  Follow up 3 months. 5.  Patient/POA to call should there be a question/concern in there interim.

## 2018-12-06 NOTE — Patient Instructions (Signed)

## 2018-12-07 NOTE — Progress Notes (Signed)
[place]   [phone]) [phone] [phone]) [phone]   Clinic Follow up Note   Patient Care Team: [name], MD as PCP - General (Internal Medicine) Earnstine Regal, PA-C as Physician Assistant (Obstetrics and Gynecology) Rolm Bookbinder, MD as Consulting Physician (General Surgery) Truitt Merle, MD as Consulting Physician (Hematology) Thea Silversmith, MD as Consulting Physician (Radiation Oncology) Mauro Kaufmann, RN as Registered Nurse Rockwell Germany, RN as Registered Nurse Sylvan Cheese, NP as Nurse Practitioner (Hematology and Oncology)  Date of Service:  12/11/2018  CHIEF COMPLAINT: Follow up right breast DCIS  SUMMARY OF ONCOLOGIC HISTORY: Oncology History Overview Note  Breast cancer of upper-outer quadrant of right female breast [facility])   Staging form: Breast, AJCC 7th Edition     Clinical stage from 03/26/2015: Stage 0 (Tis (DCIS), N0, M0) - Unsigned     Breast cancer of upper-outer quadrant of right female breast ([place])  03/13/2015 Mammogram   7 mm oval mass in the right breast is suspicious for malignancy.   03/20/2015 Initial Biopsy   Breast, right, needle core biopsy DUCTAL CARCINOMA IN SITU IN PAPILLOMA, LOW GRADE   03/20/2015 Receptors her2   Estrogen Receptor: 95%, POSITIVE, STRONG STAINING INTENSITY Progesterone Receptor: 95%, POSITIVE, STRONG STAINING INTENSITY   03/20/2015 Clinical Stage   Stage 0: Tis N0   04/09/2015 Surgery   Right breast lumpectomy   04/09/2015 Pathology Results   Right breast lumpectomy showed low-grade DCIS, margins were negative. Fibrocystic changes with adenosis.   04/09/2015 Pathologic Stage   Stage 0: Tis N0   04/11/2015 Procedure   Breast/Ovarian panel (GeneDX) revealed variant of uncertain significance called "c.704C>A (p.Thr235Lys; otherwise negative at ATM, BARD1, BRCA1, BRCA2, BRIP1, CDH1, CHEK2, FANCC, MLH1, MSH2, MSH6, NBN, PALB2, PMS2, PTEN, RAD51C, RAD51D, TP53, and XRCC2.     06/20/2015 -  Anti-estrogen oral therapy   anastrozole 1 mg once daily   08/21/2015 Survivorship   Survivorship care plan completed and copy given to patient   03/23/2017 Mammogram   IMPRESSION: No evidence of malignancy      CURRENT THERAPY:  Anastrozole 1 mg once daily, started on 06/20/2015  INTERVAL HISTORY:  Evelyn Orr is here for a follow up right breast DCIS. She was last seen by me 1 year ago. She presents to the clinic alone. She notes she is doing well. She denies any issues with Anastrozole except manageable hot flashes. She notes she was started on Acyclovir due to herpes complex outbreak on her stomach. She denies anymore pain. She notes she plans to see her PCP next month and sees them yearly. She also sees her RA.     REVIEW OF SYSTEMS:   Constitutional: Denies fevers, chills or abnormal weight loss (+) Manageable hot flashes  Eyes: Denies blurriness of vision Ears, nose, mouth, throat, and face: Denies mucositis or sore throat Respiratory: Denies cough, dyspnea or wheezes Cardiovascular: Denies palpitation, chest discomfort or lower extremity swelling Gastrointestinal:  Denies nausea, heartburn or change in bowel habits Skin: Denies abnormal skin rashes Lymphatics: Denies new lymphadenopathy or easy bruising Neurological:Denies numbness, tingling or new weaknesses Behavioral/Psych: Mood is stable, no new changes  All other systems were reviewed with the patient and are negative.  MEDICAL HISTORY:  Past Medical History:  Diagnosis Date  . Arthritis   . Breast cancer ([place])   . Breast cancer of upper-outer quadrant of right female breast ([place]) 03/20/2015  . Cough   . Hypothyroidism   . Nasal congestion   . PONV (  postoperative nausea and vomiting)   . Rheumatoid arthritis(714.0)   . Seasonal allergies   . Thyroid disease    hypothyroidism    SURGICAL HISTORY: Past Surgical History:  Procedure Laterality Date  . BREAST LUMPECTOMY WITH RADIOACTIVE  SEED LOCALIZATION Right 04/09/2015   Procedure: BREAST LUMPECTOMY WITH RADIOACTIVE SEED LOCALIZATION;  Surgeon: Rolm Bookbinder, MD;  Location: [facility];  Service: General;  Laterality: Right;  . BREAST REDUCTION SURGERY  2001  . CALCANEAL OSTEOTOMY Left 10/11/2013   Procedure: LEFT CALCANEAL OSTEOTOMY;  Surgeon: Wylene Simmer, MD;  Location: [facility];  Service: Orthopedics;  Laterality: Left;  . CARPAL TUNNEL RELEASE  2006   rt  . CARPAL TUNNEL RELEASE     left  . CERVICAL FUSION  2009  . COLONOSCOPY     2002,2005,2008 w/[place]  . GASTROCNEMIUS RECESSION Left 10/11/2013   Procedure: LEFT GASTROC RECESSION; LEFT POSTERIOR TIBIAL TENOLYSIS;  Surgeon: Wylene Simmer, MD;  Location: [facility];  Service: Orthopedics;  Laterality: Left;  . KNEE ARTHROSCOPY  2004   right  . POLYPECTOMY    . REMOVAL OF IMPLANT Left 02/14/2014   Procedure: LEFT FOOT REMOVAL OF DEEP IMPLANT;  Surgeon: Wylene Simmer, MD;  Location: [place];  Service: Orthopedics;  Laterality: Left;  . SHOULDER ARTHROSCOPY W/ ROTATOR CUFF REPAIR  2006   right  . THYROIDECTOMY     age 69  . TONSILLECTOMY    . TOTAL KNEE ARTHROPLASTY Right 06/21/2016   Procedure: RIGHT TOTAL KNEE ARTHROPLASTY;  Surgeon: Paralee Cancel, MD;  Location: [facility];  Service: Orthopedics;  Laterality: Right;  . TUBAL LIGATION    . WRIST ARTHRODESIS  2003   left     I have reviewed the social history and family history with the patient and they are unchanged from previous note.  ALLERGIES:  has No Known Allergies.  MEDICATIONS:  Current Outpatient Medications  Medication Sig Dispense Refill  . anastrozole (ARIMIDEX) 1 MG tablet Take 1 mg by mouth daily.    . Azelastine HCl 0.15 % SOLN 1 SPRAY EACH NOSTRIL DAILY AS NEEDED FOR NASAL CONGESTION  1  . benzonatate (TESSALON) 100 MG capsule TAKE 1 CAPSULE BY MOUTH TWICE A DAY 30 capsule 1  . Cholecalciferol (VITAMIN D) 10 MCG/ML LIQD Vitamin D     . Cholecalciferol (VITAMIN D-3) 5000 units TABS Take 5,000 Units by mouth daily.    . clotrimazole-betamethasone (LOTRISONE) cream clotrimazole-betamethasone 1 %-0.05 % topical cream  APPLY TO AFFECTED AND SURROUNDING AREAS OF SKIN 2 TIMES A DAY, MORNING AND EVENING FOR 2 WEEKS    . dexlansoprazole (DEXILANT) 60 MG capsule Take 60 mg by mouth daily.    Marland Kitchen levothyroxine (SYNTHROID) 88 MCG tablet TAKE 1 TABLET BY MOUTH EVERY DAY 90 tablet 2  . methotrexate (RHEUMATREX) 2.5 MG tablet Take 2.5 mg by mouth once a week. 6 tablets weekly  1  . pantoprazole (PROTONIX) 40 MG tablet TAKE 1 TABLET (40 MG TOTAL) BY MOUTH DAILY. TAKE 30-60 MIN BEFORE FIRST MEAL. 90 tablet 0  . predniSONE (DELTASONE) 10 MG tablet 20 qd x 3 days, then 1 qd x 3 days, then 1/2 qd  til cone. 15 tablet 0  . valACYclovir (VALTREX) 500 MG tablet valacyclovir 500 mg tablet  TAKE 1 TABLET BY MOUTH TWICE A DAY FOR 5 DAYS    . Vitamin D, Ergocalciferol, (DRISDOL) 1.25 MG (50000 UT) CAPS capsule TAKE 1 CAPSULE BY MOUTH 2 TIMES EVERY WEEK 8 capsule  0   No current facility-administered medications for this visit.     PHYSICAL EXAMINATION: ECOG PERFORMANCE STATUS: 1 - Symptomatic but completely ambulatory  Vitals:   12/11/18 0925  BP: (!) 151/85  Pulse: 81  Resp: 17  Temp: 99.4 F (37.4 C)  SpO2: 98%   Filed Weights   12/11/18 0925  Weight: 165 lb 4.8 oz (75 kg)    GENERAL:alert, no distress and comfortable SKIN: skin color, texture, turgor are normal, no rashes or significant lesions EYES: normal, Conjunctiva are pink and non-injected, sclera clear  NECK: supple, thyroid normal size, non-tender, without nodularity LYMPH:  no palpable lymphadenopathy in the cervical, axillary  LUNGS: clear to auscultation and percussion with normal breathing effort HEART: regular rate & rhythm and no murmurs and no lower extremity edema ABDOMEN:abdomen soft, non-tender and normal bowel sounds Musculoskeletal:no cyanosis of digits and no  clubbing  NEURO: alert & oriented x 3 with fluent speech, no focal motor/sensory deficits BREAST: S/p right lumpectomy and breast reduction: Surgical incision healed well (+) No palpable mass or adenopathy   LABORATORY DATA:  I have reviewed the data as listed CBC Latest Ref Rng & Units 12/11/2018 03/13/2018 12/12/2017  WBC 4.0 - 10.5 K/uL 7.4 6.2 5.7  Hemoglobin 12.0 - 15.0 g/dL 13.0 12.5 12.5  Hematocrit 36.0 - 46.0 % 39.9 37.9 38.5  Platelets 150 - 400 K/uL 351 371 308     CMP Latest Ref Rng & Units 12/11/2018 03/13/2018 01/26/2018  Glucose 70 - 99 mg/dL 95 71 -  BUN 8 - 23 mg/dL 8 7(L) 11  Creatinine 0.44 - 1.00 mg/dL 0.77 0.73 0.7  Sodium 135 - 145 mmol/L 142 142 142  Potassium 3.5 - 5.1 mmol/L 3.7 3.8 4.5  Chloride 98 - 111 mmol/L 104 100 -  CO2 22 - 32 mmol/L 27 23 -  Calcium 8.9 - 10.3 mg/dL 9.6 9.6 -  Total Protein 6.5 - 8.1 g/dL 8.3(H) 7.9 -  Total Bilirubin 0.3 - 1.2 mg/dL 0.3 0.4 -  Alkaline Phos 38 - 126 U/L 103 110 -  AST 15 - 41 U/L 19 18 -  ALT 0 - 44 U/L 8 12 -      RADIOGRAPHIC STUDIES: I have personally reviewed the radiological images as listed and agreed with the findings in the report. No results found.   ASSESSMENT & PLAN:  ALIXANDRA ALFIERI is a 73 y.o. female with   1. Right breast DCIS, low grade, ER and PR strongly positive -She was diagnosed in 03/2015. She is s/p right lumpectomy with complete resection. Any form of adjuvant therapy is preventive. -adjuvant irradiation was not recommended. -She is currently on anastrozole since 06/2015 to prevent future breast cancer. Tolerating well with manageable hot flashes, we'll continue, plan for a total of 5 years -We previously discussed breast cancer surveillance after she completes treatment, Including annual mammogram, monthly self breast exam, and breast exam by a physician every 6-12 months. -She is clinically doing well. Lab reviewed, her CBC and CMP are within normal limits. Her physical exam and  her 02/2018 mammogram were unremarkable. There is no clinical concern for recurrence. -Continue surveillance, next mammogram in 03/2019 -Continue Anastrozole until 05/2020  -F/u in 1 year as she is regularly seen by her other physicians.    2. Osteopenia -Her bone density scan in December 2016 showed osteopenia -Her risk of fracture is not high, no need for bisphosphonate. -DEXA on 05/30/17 had a T score of -2.4 of AP, unchanged from  previous scan  -05/2018 DEXA shows stable AP spine, but decrease in b/l hips, she remains osteopenic.  -I have previously encouraged her to take VitD and calcium supplement as well and to remain active with walking often.    3. Hyperthyroidism and rheumatoid arthritis -She will continue follow-up with her primary care physician. -She is on weekly methotrexate, and her rheumatoid arthritis has been well-controlled.  Plan -She is clinically doing well  -I will copy her labs to her PCP Dr. Baird Cancer.  -continue Anastrozole  -[facility] in 03/2019 [facility]  -F/u in 1 year    No problem-specific Assessment & Plan notes found for this encounter.   Orders Placed This Encounter  Procedures  . MM DIAG BREAST TOMO BILATERAL    Standing Status:   Future    Standing Expiration Date:   12/11/2019    Scheduling Instructions:     [facility]    Order Specific Question:   Reason for Exam (SYMPTOM  OR DIAGNOSIS REQUIRED)    Answer:   screening    Order Specific Question:   Preferred imaging location?    Answer:   External   All questions were answered. The patient knows to call the clinic with any problems, questions or concerns. No barriers to learning was detected. I spent 15 minutes counseling the patient face to face. The total time spent in the appointment was 20 minutes and more than 50% was on counseling and review of test results     Truitt Merle, MD 12/11/2018   I, Joslyn Devon, am acting as scribe for Truitt Merle, MD.   I have reviewed the above  documentation for accuracy and completeness, and I agree with the above.

## 2018-12-11 ENCOUNTER — Telehealth: Payer: Self-pay | Admitting: Hematology

## 2018-12-11 ENCOUNTER — Inpatient Hospital Stay: Payer: Medicare Other | Attending: Hematology

## 2018-12-11 ENCOUNTER — Other Ambulatory Visit: Payer: Self-pay

## 2018-12-11 ENCOUNTER — Inpatient Hospital Stay (HOSPITAL_BASED_OUTPATIENT_CLINIC_OR_DEPARTMENT_OTHER): Payer: Medicare Other | Admitting: Hematology

## 2018-12-11 ENCOUNTER — Encounter: Payer: Self-pay | Admitting: Hematology

## 2018-12-11 VITALS — BP 151/85 | HR 81 | Temp 99.4°F | Resp 17 | Ht 61.2 in | Wt 165.3 lb

## 2018-12-11 DIAGNOSIS — M8580 Other specified disorders of bone density and structure, unspecified site: Secondary | ICD-10-CM | POA: Diagnosis not present

## 2018-12-11 DIAGNOSIS — C50411 Malignant neoplasm of upper-outer quadrant of right female breast: Secondary | ICD-10-CM

## 2018-12-11 DIAGNOSIS — K219 Gastro-esophageal reflux disease without esophagitis: Secondary | ICD-10-CM

## 2018-12-11 DIAGNOSIS — D0511 Intraductal carcinoma in situ of right breast: Secondary | ICD-10-CM | POA: Diagnosis not present

## 2018-12-11 DIAGNOSIS — M069 Rheumatoid arthritis, unspecified: Secondary | ICD-10-CM

## 2018-12-11 DIAGNOSIS — E039 Hypothyroidism, unspecified: Secondary | ICD-10-CM

## 2018-12-11 DIAGNOSIS — E0590 Thyrotoxicosis, unspecified without thyrotoxic crisis or storm: Secondary | ICD-10-CM

## 2018-12-11 DIAGNOSIS — R232 Flushing: Secondary | ICD-10-CM

## 2018-12-11 DIAGNOSIS — Z79811 Long term (current) use of aromatase inhibitors: Secondary | ICD-10-CM

## 2018-12-11 DIAGNOSIS — Z79899 Other long term (current) drug therapy: Secondary | ICD-10-CM

## 2018-12-11 DIAGNOSIS — Z170 Estrogen receptor positive status [ER+]: Secondary | ICD-10-CM

## 2018-12-11 LAB — CBC WITH DIFFERENTIAL/PLATELET
Abs Immature Granulocytes: 0.01 10*3/uL (ref 0.00–0.07)
Basophils Absolute: 0.1 10*3/uL (ref 0.0–0.1)
Basophils Relative: 1 %
Eosinophils Absolute: 0.1 10*3/uL (ref 0.0–0.5)
Eosinophils Relative: 1 %
HCT: 39.9 % (ref 36.0–46.0)
Hemoglobin: 13 g/dL (ref 12.0–15.0)
Immature Granulocytes: 0 %
Lymphocytes Relative: 48 %
Lymphs Abs: 3.6 10*3/uL (ref 0.7–4.0)
MCH: 30.4 pg (ref 26.0–34.0)
MCHC: 32.6 g/dL (ref 30.0–36.0)
MCV: 93.2 fL (ref 80.0–100.0)
Monocytes Absolute: 0.4 10*3/uL (ref 0.1–1.0)
Monocytes Relative: 5 %
Neutro Abs: 3.4 10*3/uL (ref 1.7–7.7)
Neutrophils Relative %: 45 %
Platelets: 351 10*3/uL (ref 150–400)
RBC: 4.28 MIL/uL (ref 3.87–5.11)
RDW: 13.4 % (ref 11.5–15.5)
WBC: 7.4 10*3/uL (ref 4.0–10.5)
nRBC: 0 % (ref 0.0–0.2)

## 2018-12-11 LAB — COMPREHENSIVE METABOLIC PANEL
ALT: 8 U/L (ref 0–44)
AST: 19 U/L (ref 15–41)
Albumin: 3.8 g/dL (ref 3.5–5.0)
Alkaline Phosphatase: 103 U/L (ref 38–126)
Anion gap: 11 (ref 5–15)
BUN: 8 mg/dL (ref 8–23)
CO2: 27 mmol/L (ref 22–32)
Calcium: 9.6 mg/dL (ref 8.9–10.3)
Chloride: 104 mmol/L (ref 98–111)
Creatinine, Ser: 0.77 mg/dL (ref 0.44–1.00)
GFR calc Af Amer: >60 mL/min (ref >60)
GFR calc non Af Amer: >60 mL/min (ref >60)
Glucose, Bld: 95 mg/dL (ref 70–99)
Potassium: 3.7 mmol/L (ref 3.5–5.1)
Sodium: 142 mmol/L (ref 135–145)
Total Bilirubin: 0.3 mg/dL (ref 0.3–1.2)
Total Protein: 8.3 g/dL — ABNORMAL HIGH (ref 6.5–8.1)

## 2018-12-11 NOTE — Telephone Encounter (Signed)
Scheduled appt per 7/13 los. °Printed and mailed appt calendar. °

## 2018-12-15 ENCOUNTER — Encounter: Payer: Self-pay | Admitting: Internal Medicine

## 2018-12-22 ENCOUNTER — Ambulatory Visit: Payer: BC Managed Care – PPO | Admitting: Podiatry

## 2018-12-28 ENCOUNTER — Telehealth: Payer: Self-pay | Admitting: Hematology

## 2018-12-28 NOTE — Telephone Encounter (Signed)
Returned patient's phone call regarding rescheduling 12/10/19 appointment time, the time has been changed per patient's request.

## 2019-01-12 ENCOUNTER — Other Ambulatory Visit: Payer: Self-pay | Admitting: Internal Medicine

## 2019-01-15 ENCOUNTER — Other Ambulatory Visit: Payer: Self-pay

## 2019-01-15 MED ORDER — BENZONATATE 100 MG PO CAPS: 100.0000 mg | ORAL_CAPSULE | Freq: Two times a day (BID) | ORAL | 1 refills | Status: DC

## 2019-01-17 DIAGNOSIS — M25462 Effusion, left knee: Secondary | ICD-10-CM | POA: Diagnosis not present

## 2019-01-17 DIAGNOSIS — M25562 Pain in left knee: Secondary | ICD-10-CM | POA: Diagnosis not present

## 2019-02-06 ENCOUNTER — Ambulatory Visit (INDEPENDENT_AMBULATORY_CARE_PROVIDER_SITE_OTHER): Payer: Medicare Other | Admitting: Sports Medicine

## 2019-02-06 ENCOUNTER — Other Ambulatory Visit: Payer: Self-pay

## 2019-02-06 ENCOUNTER — Encounter: Payer: Self-pay | Admitting: Sports Medicine

## 2019-02-06 DIAGNOSIS — M7672 Peroneal tendinitis, left leg: Secondary | ICD-10-CM

## 2019-02-06 DIAGNOSIS — M25572 Pain in left ankle and joints of left foot: Secondary | ICD-10-CM

## 2019-02-06 MED ORDER — TRIAMCINOLONE ACETONIDE 10 MG/ML IJ SUSP
10.0000 mg | Freq: Once | INTRAMUSCULAR | Status: AC
  Administered 2019-02-06: 10 mg

## 2019-02-06 NOTE — Progress Notes (Signed)
Subjective:  Evelyn Orr is a 73 y.o. female patient who presents to office for evaluation of Left ankle pain. Patient complains of continued pain in the ankle. Patient has tried trilock with some relief but still hurts and swell at Left lateral foot/ankle, pain after standing/walking or 30 mins 6-7/10 sharp in nature. Patient denies any other pedal complaints. Denies new injury/trip/fall/sprain/any causative factors.   Patient Active Problem List   Diagnosis Date Noted  . Routine general medical examination at a health care facility 03/11/2018  . Upper airway cough syndrome 09/22/2017  . Osteopenia 12/16/2016  . Overweight (BMI 25.0-29.9) 06/23/2016  . S/P right TKA 06/21/2016  . Genetic testing 04/14/2015  . Family history of breast cancer 04/01/2015  . Breast cancer of upper-outer quadrant of right female breast ([place]) 03/20/2015  . Intraductal carcinoma in situ of breast 03/20/2015  . Flatfoot 10/11/2013  . Tendonitis, Achilles, right 07/31/2013  . Metatarsal deformity 07/31/2013  . Pain in lower limb 07/31/2013  . Rheumatoid arthritis ([place]) 03/17/2012  . Thyroid disease 03/17/2012    Current Outpatient Medications on File Prior to Visit  Medication Sig Dispense Refill  . anastrozole (ARIMIDEX) 1 MG tablet Take 1 mg by mouth daily.    . Azelastine HCl 0.15 % SOLN 1 SPRAY EACH NOSTRIL DAILY AS NEEDED FOR NASAL CONGESTION  1  . benzonatate (TESSALON) 100 MG capsule Take 1 capsule (100 mg total) by mouth 2 (two) times daily. 30 capsule 1  . chlorhexidine (PERIDEX) 0.12 % solution RINSE WITH 1 CAPFUL FOR 30 SECONDS AM & PM. SPIT AFTER RINSING, DO NOT SWALLOW.    . Cholecalciferol (VITAMIN D) 10 MCG/ML LIQD Vitamin D    . Cholecalciferol (VITAMIN D-3) 5000 units TABS Take 5,000 Units by mouth daily.    . clotrimazole-betamethasone (LOTRISONE) cream clotrimazole-betamethasone 1 %-0.05 % topical cream  APPLY TO AFFECTED AND SURROUNDING AREAS OF SKIN 2 TIMES A DAY, MORNING AND  EVENING FOR 2 WEEKS    . dexlansoprazole (DEXILANT) 60 MG capsule Take 60 mg by mouth daily.    Marland Kitchen HYDROcodone-acetaminophen (NORCO/VICODIN) 5-325 MG tablet     . levothyroxine (SYNTHROID) 88 MCG tablet TAKE 1 TABLET BY MOUTH EVERY DAY 90 tablet 2  . methotrexate (RHEUMATREX) 2.5 MG tablet Take 2.5 mg by mouth once a week. 6 tablets weekly  1  . pantoprazole (PROTONIX) 40 MG tablet TAKE 1 TABLET (40 MG TOTAL) BY MOUTH DAILY. TAKE 30-60 MIN BEFORE FIRST MEAL. 90 tablet 0  . predniSONE (DELTASONE) 10 MG tablet 20 qd x 3 days, then 1 qd x 3 days, then 1/2 qd  til cone. 15 tablet 0  . valACYclovir (VALTREX) 500 MG tablet valacyclovir 500 mg tablet  TAKE 1 TABLET BY MOUTH TWICE A DAY FOR 5 DAYS    . Vitamin D, Ergocalciferol, (DRISDOL) 1.25 MG (50000 UT) CAPS capsule TAKE 1 CAPSULE BY MOUTH 2 TIMES EVERY WEEK 8 capsule 0   No current facility-administered medications on file prior to visit.     No Known Allergies  Objective:  General: Alert and oriented x3 in no acute distress  Dermatology: No open lesions bilateral lower extremities, no webspace macerations, no ecchymosis bilateral, all nails x 10 are well manicured.  Vascular: Dorsalis Pedis and Posterior Tibial pedal pulses palpable, Capillary Fill Time 3 seconds,(+) pedal hair growth bilateral, no edema bilateral lower extremities, Temperature gradient within normal limits.  Neurology: Johney Maine sensation intact via light touch bilateral.   Musculoskeletal: Mild tenderness with palpation at left lateral  ankle, Negative talar tilt, Negative tib-fib stress, No instability. No pain with calf compression bilateral. Range of motion within normal limits with mild guarding on Left ankle at sinus tarsi>peroneal. Strength within normal limits in all groups bilateral.   Assessment and Plan: Problem List Items Addressed This Visit    None    Visit Diagnoses    Sinus tarsi syndrome of left foot    -  Primary   Peroneal tendinitis of left lower  extremity       Left ankle pain, unspecified chronicity           -Complete examination performed -Xrays reviewed -Discussed treatement options -After oral consent and aseptic prep, injected a mixture containing 1 ml of 2%  plain lidocaine, 1 ml 0.5% plain marcaine, 0.5 ml of kenalog 10 and 0.5 ml of dexamethasone phosphate into Left sinus tarsi without complication. Post-injection care discussed with patient.  -Recommend continue with trilock and good supportive shoes  -Patient to return to office as needed or sooner if condition worsens. If no better will order MRI and put in CAM boot.  Landis Martins, DPM

## 2019-02-11 ENCOUNTER — Other Ambulatory Visit: Payer: Self-pay | Admitting: Nurse Practitioner

## 2019-02-26 ENCOUNTER — Ambulatory Visit: Payer: BC Managed Care – PPO | Admitting: Podiatry

## 2019-02-26 DIAGNOSIS — M79646 Pain in unspecified finger(s): Secondary | ICD-10-CM | POA: Diagnosis not present

## 2019-02-26 DIAGNOSIS — Z79899 Other long term (current) drug therapy: Secondary | ICD-10-CM | POA: Diagnosis not present

## 2019-02-26 DIAGNOSIS — M0589 Other rheumatoid arthritis with rheumatoid factor of multiple sites: Secondary | ICD-10-CM | POA: Diagnosis not present

## 2019-02-26 DIAGNOSIS — Z13820 Encounter for screening for osteoporosis: Secondary | ICD-10-CM | POA: Diagnosis not present

## 2019-03-02 ENCOUNTER — Ambulatory Visit (INDEPENDENT_AMBULATORY_CARE_PROVIDER_SITE_OTHER): Payer: Medicare Other | Admitting: Podiatry

## 2019-03-02 ENCOUNTER — Encounter

## 2019-03-02 ENCOUNTER — Other Ambulatory Visit: Payer: Self-pay

## 2019-03-02 ENCOUNTER — Encounter: Payer: Self-pay | Admitting: Podiatry

## 2019-03-02 DIAGNOSIS — L84 Corns and callosities: Secondary | ICD-10-CM

## 2019-03-02 NOTE — Patient Instructions (Signed)
Corns and Calluses Corns are small areas of thickened skin that occur on the top, sides, or tip of a toe. They contain a cone-shaped core with a point that can press on a nerve below. This causes pain.  Calluses are areas of thickened skin that can occur anywhere on the body, including the hands, fingers, palms, soles of the feet, and heels. Calluses are usually larger than corns. What are the causes? Corns and calluses are caused by rubbing (friction) or pressure, such as from shoes that are too tight or do not fit properly. What increases the risk? Corns are more likely to develop in people who have misshapen toes (toe deformities), such as hammer toes. Calluses can occur with friction to any area of the skin. They are more likely to develop in people who:  Work with their hands.  Wear shoes that fit poorly, are too tight, or are high-heeled.  Have toe deformities. What are the signs or symptoms? Symptoms of a corn or callus include:  A hard growth on the skin.  Pain or tenderness under the skin.  Redness and swelling.  Increased discomfort while wearing tight-fitting shoes, if your feet are affected. If a corn or callus becomes infected, symptoms may include:  Redness and swelling that gets worse.  Pain.  Fluid, blood, or pus draining from the corn or callus. How is this diagnosed? Corns and calluses may be diagnosed based on your symptoms, your medical history, and a physical exam. How is this treated? Treatment for corns and calluses may include:  Removing the cause of the friction or pressure. This may involve: ? Changing your shoes. ? Wearing shoe inserts (orthotics) or other protective layers in your shoes, such as a corn pad. ? Wearing gloves.  Applying medicine to the skin (topical medicine) to help soften skin in the hardened, thickened areas.  Removing layers of dead skin with a file to reduce the size of the corn or callus.  Removing the corn or callus with a  scalpel or laser.  Taking antibiotic medicines, if your corn or callus is infected.  Having surgery, if a toe deformity is the cause. Follow these instructions at home:   Take over-the-counter and prescription medicines only as told by your health care provider.  If you were prescribed an antibiotic, take it as told by your health care provider. Do not stop taking it even if your condition starts to improve.  Wear shoes that fit well. Avoid wearing high-heeled shoes and shoes that are too tight or too loose.  Wear any padding, protective layers, gloves, or orthotics as told by your health care provider.  Soak your hands or feet and then use a file or pumice stone to soften your corn or callus. Do this as told by your health care provider.  Check your corn or callus every day for symptoms of infection. Contact a health care provider if you:  Notice that your symptoms do not improve with treatment.  Have redness or swelling that gets worse.  Notice that your corn or callus becomes painful.  Have fluid, blood, or pus coming from your corn or callus.  Have new symptoms. Summary  Corns are small areas of thickened skin that occur on the top, sides, or tip of a toe.  Calluses are areas of thickened skin that can occur anywhere on the body, including the hands, fingers, palms, and soles of the feet. Calluses are usually larger than corns.  Corns and calluses are caused by   rubbing (friction) or pressure, such as from shoes that are too tight or do not fit properly.  Treatment may include wearing any padding, protective layers, gloves, or orthotics as told by your health care provider. This information is not intended to replace advice given to you by your health care provider. Make sure you discuss any questions you have with your health care provider. Document Released: 02/21/2004 Document Revised: 09/06/2018 Document Reviewed: 03/30/2017 Elsevier Patient Education  2020 Elsevier  Inc.   Onychomycosis/Fungal Toenails  WHAT IS IT? An infection that lies within the keratin of your nail plate that is caused by a fungus.  WHY ME? Fungal infections affect all ages, sexes, races, and creeds.  There may be many factors that predispose you to a fungal infection such as age, coexisting medical conditions such as diabetes, or an autoimmune disease; stress, medications, fatigue, genetics, etc.  Bottom line: fungus thrives in a warm, moist environment and your shoes offer such a location.  IS IT CONTAGIOUS? Theoretically, yes.  You do not want to share shoes, nail clippers or files with someone who has fungal toenails.  Walking around barefoot in the same room or sleeping in the same bed is unlikely to transfer the organism.  It is important to realize, however, that fungus can spread easily from one nail to the next on the same foot.  HOW DO WE TREAT THIS?  There are several ways to treat this condition.  Treatment may depend on many factors such as age, medications, pregnancy, liver and kidney conditions, etc.  It is best to ask your doctor which options are available to you.  1. No treatment.   Unlike many other medical concerns, you can live with this condition.  However for many people this can be a painful condition and may lead to ingrown toenails or a bacterial infection.  It is recommended that you keep the nails cut short to help reduce the amount of fungal nail. 2. Topical treatment.  These range from herbal remedies to prescription strength nail lacquers.  About 40-50% effective, topicals require twice daily application for approximately 9 to 12 months or until an entirely new nail has grown out.  The most effective topicals are medical grade medications available through physicians offices. 3. Oral antifungal medications.  With an 80-90% cure rate, the most common oral medication requires 3 to 4 months of therapy and stays in your system for a year as the new nail grows out.   Oral antifungal medications do require blood work to make sure it is a safe drug for you.  A liver function panel will be performed prior to starting the medication and after the first month of treatment.  It is important to have the blood work performed to avoid any harmful side effects.  In general, this medication safe but blood work is required. 4. Laser Therapy.  This treatment is performed by applying a specialized laser to the affected nail plate.  This therapy is noninvasive, fast, and non-painful.  It is not covered by insurance and is therefore, out of pocket.  The results have been very good with a 80-95% cure rate.  The [facility] is the only practice in the area to offer this therapy. 5. Permanent Nail Avulsion.  Removing the entire nail so that a new nail will not grow back. 

## 2019-03-04 NOTE — Progress Notes (Signed)
Subjective: Evelyn Orr presents to clinic with cc of painful callus left foot which is aggravated when weightbearing with and without shoe gear.  This pain limits her daily activities. Pain symptoms resolve with periodic professional debridement.  She did see Dr. Cannon Orr for sinus tarsi injection left ankle and states it feels better now.   She is concerned about appearance of right great toenail. She denies any trauma to digit.  Evelyn Chard, MD is her PCP.   Current Outpatient Medications on File Prior to Visit  Medication Sig Dispense Refill  . anastrozole (ARIMIDEX) 1 MG tablet TAKE 1 TABLET BY MOUTH EVERY DAY 90 tablet 3  . Azelastine HCl 0.15 % SOLN 1 SPRAY EACH NOSTRIL DAILY AS NEEDED FOR NASAL CONGESTION  1  . benzonatate (TESSALON) 100 MG capsule Take 1 capsule (100 mg total) by mouth 2 (two) times daily. 30 capsule 1  . chlorhexidine (PERIDEX) 0.12 % solution RINSE WITH 1 CAPFUL FOR 30 SECONDS AM & PM. SPIT AFTER RINSING, DO NOT SWALLOW.    . Cholecalciferol (VITAMIN D) 10 MCG/ML LIQD Vitamin D    . Cholecalciferol (VITAMIN D-3) 5000 units TABS Take 5,000 Units by mouth daily.    . clotrimazole-betamethasone (LOTRISONE) cream clotrimazole-betamethasone 1 %-0.05 % topical cream  APPLY TO AFFECTED AND SURROUNDING AREAS OF SKIN 2 TIMES A DAY, MORNING AND EVENING FOR 2 WEEKS    . dexlansoprazole (DEXILANT) 60 MG capsule Take 60 mg by mouth daily.    Marland Kitchen HYDROcodone-acetaminophen (NORCO/VICODIN) 5-325 MG tablet     . levothyroxine (SYNTHROID) 88 MCG tablet TAKE 1 TABLET BY MOUTH EVERY DAY 90 tablet 2  . methotrexate (RHEUMATREX) 2.5 MG tablet Take 2.5 mg by mouth once a week. 6 tablets weekly  1  . pantoprazole (PROTONIX) 40 MG tablet TAKE 1 TABLET (40 MG TOTAL) BY MOUTH DAILY. TAKE 30-60 MIN BEFORE FIRST MEAL. 90 tablet 0  . predniSONE (DELTASONE) 10 MG tablet 20 qd x 3 days, then 1 qd x 3 days, then 1/2 qd  til cone. 15 tablet 0  . valACYclovir (VALTREX) 500 MG tablet  valacyclovir 500 mg tablet  TAKE 1 TABLET BY MOUTH TWICE A DAY FOR 5 DAYS    . Vitamin D, Ergocalciferol, (DRISDOL) 1.25 MG (50000 UT) CAPS capsule TAKE 1 CAPSULE BY MOUTH 2 TIMES EVERY WEEK 8 capsule 0   No current facility-administered medications on file prior to visit.      No Known Allergies   Objective: Physical Examination:  Vascular  Examination: Capillary refill time immediate x 10 digits.  Palpable DP/PT pulses b/l.  Digital hair present b/l.  No edema noted b/l.  Skin temperature gradient WNL b/l.  Dermatological Examination: Skin with normal turgor, texture and tone b/l.  No open wounds b/l.  No interdigital macerations noted b/l.  Toenails 1-5 b/l well manicured with adequate length. Right great toe with old subungual hematoma right hallux. No edema, no erythema, no drainage, no flocculence.  Hyperkeratotic lesion submet head 5 left foot and b/l heels with tenderness to palpation. No edema, no erythema, no drainage, no flocculence.   Musculoskeletal Examination: Muscle strength 5/5 to all muscle groups b/l.  No pain, crepitus or joint discomfort with active/passive ROM.  Neurological Examination: Sensation intact 5/5 b/l with 10 gram monofilament.  Vibratory sensation intact b/l.  Proprioceptive sensation intact b/l.  Assessment: Calluses submet head 5 left and b/l heels Old subungual hematoma, stable right hallux  Plan: Calluses pared submetatarsal head(s) 5 left foot and b/l heels  utilizing sterile scalpel blade without incident. Discussed subungual hematoma right hallux. Stable at present. Monitor for now. Call if she experiences any symptoms. Continue soft, supportive shoe gear daily. Report any pedal injuries to medical professional. Follow up 9 weeks. Patient/POA to call should there be a question/concern in there interim.

## 2019-03-08 DIAGNOSIS — Z03818 Encounter for observation for suspected exposure to other biological agents ruled out: Secondary | ICD-10-CM | POA: Diagnosis not present

## 2019-03-19 ENCOUNTER — Other Ambulatory Visit: Payer: Self-pay

## 2019-03-19 ENCOUNTER — Encounter: Payer: Self-pay | Admitting: Internal Medicine

## 2019-03-19 ENCOUNTER — Ambulatory Visit (INDEPENDENT_AMBULATORY_CARE_PROVIDER_SITE_OTHER): Payer: Medicare Other | Admitting: Internal Medicine

## 2019-03-19 VITALS — BP 126/84 | HR 74 | Temp 98.2°F | Ht 61.4 in | Wt 164.8 lb

## 2019-03-19 DIAGNOSIS — Z0000 Encounter for general adult medical examination without abnormal findings: Secondary | ICD-10-CM | POA: Diagnosis not present

## 2019-03-19 DIAGNOSIS — N898 Other specified noninflammatory disorders of vagina: Secondary | ICD-10-CM | POA: Diagnosis not present

## 2019-03-19 DIAGNOSIS — E6609 Other obesity due to excess calories: Secondary | ICD-10-CM

## 2019-03-19 DIAGNOSIS — Z6830 Body mass index (BMI) 30.0-30.9, adult: Secondary | ICD-10-CM

## 2019-03-19 DIAGNOSIS — E7800 Pure hypercholesterolemia, unspecified: Secondary | ICD-10-CM

## 2019-03-19 DIAGNOSIS — E039 Hypothyroidism, unspecified: Secondary | ICD-10-CM | POA: Diagnosis not present

## 2019-03-19 DIAGNOSIS — Z712 Person consulting for explanation of examination or test findings: Secondary | ICD-10-CM

## 2019-03-19 LAB — POCT URINALYSIS DIPSTICK
Bilirubin, UA: NEGATIVE
Blood, UA: NEGATIVE
Glucose, UA: NEGATIVE
Ketones, UA: NEGATIVE
Nitrite, UA: NEGATIVE
Protein, UA: NEGATIVE
Spec Grav, UA: 1.01 (ref 1.010–1.025)
Urobilinogen, UA: 0.2 E.U./dL
pH, UA: 6 (ref 5.0–8.0)

## 2019-03-19 MED ORDER — BENZONATATE 100 MG PO CAPS: 100.0000 mg | ORAL_CAPSULE | Freq: Two times a day (BID) | ORAL | 1 refills | Status: DC | PRN

## 2019-03-19 NOTE — Patient Instructions (Signed)
Health Maintenance, Female Adopting a healthy lifestyle and getting preventive care are important in promoting health and wellness. Ask your health care provider about:  The right schedule for you to have regular tests and exams.  Things you can do on your own to prevent diseases and keep yourself healthy. What should I know about diet, weight, and exercise? Eat a healthy diet   Eat a diet that includes plenty of vegetables, fruits, low-fat dairy products, and lean protein.  Do not eat a lot of foods that are high in solid fats, added sugars, or sodium. Maintain a healthy weight Body mass index (BMI) is used to identify weight problems. It estimates body fat based on height and weight. Your health care provider can help determine your BMI and help you achieve or maintain a healthy weight. Get regular exercise Get regular exercise. This is one of the most important things you can do for your health. Most adults should:  Exercise for at least 150 minutes each week. The exercise should increase your heart rate and make you sweat (moderate-intensity exercise).  Do strengthening exercises at least twice a week. This is in addition to the moderate-intensity exercise.  Spend less time sitting. Even light physical activity can be beneficial. Watch cholesterol and blood lipids Have your blood tested for lipids and cholesterol at 71 years of age, then have this test every 5 years. Have your cholesterol levels checked more often if:  Your lipid or cholesterol levels are high.  You are older than 71 years of age.  You are at high risk for heart disease. What should I know about cancer screening? Depending on your health history and family history, you may need to have cancer screening at various ages. This may include screening for:  Breast cancer.  Cervical cancer.  Colorectal cancer.  Skin cancer.  Lung cancer. What should I know about heart disease, diabetes, and high blood  pressure? Blood pressure and heart disease  High blood pressure causes heart disease and increases the risk of stroke. This is more likely to develop in people who have high blood pressure readings, are of African descent, or are overweight.  Have your blood pressure checked: ? Every 3-5 years if you are 18-39 years of age. ? Every year if you are 40 years old or older. Diabetes Have regular diabetes screenings. This checks your fasting blood sugar level. Have the screening done:  Once every three years after age 40 if you are at a normal weight and have a low risk for diabetes.  More often and at a younger age if you are overweight or have a high risk for diabetes. What should I know about preventing infection? Hepatitis B If you have a higher risk for hepatitis B, you should be screened for this virus. Talk with your health care provider to find out if you are at risk for hepatitis B infection. Hepatitis C Testing is recommended for:  Everyone born from 1945 through 1965.  Anyone with known risk factors for hepatitis C. Sexually transmitted infections (STIs)  Get screened for STIs, including gonorrhea and chlamydia, if: ? You are sexually active and are younger than 71 years of age. ? You are older than 71 years of age and your health care provider tells you that you are at risk for this type of infection. ? Your sexual activity has changed since you were last screened, and you are at increased risk for chlamydia or gonorrhea. Ask your health care provider if   you are at risk.  Ask your health care provider about whether you are at high risk for HIV. Your health care provider may recommend a prescription medicine to help prevent HIV infection. If you choose to take medicine to prevent HIV, you should first get tested for HIV. You should then be tested every 3 months for as long as you are taking the medicine. Pregnancy  If you are about to stop having your period (premenopausal) and  you may become pregnant, seek counseling before you get pregnant.  Take 400 to 800 micrograms (mcg) of folic acid every day if you become pregnant.  Ask for birth control (contraception) if you want to prevent pregnancy. Osteoporosis and menopause Osteoporosis is a disease in which the bones lose minerals and strength with aging. This can result in bone fractures. If you are 65 years old or older, or if you are at risk for osteoporosis and fractures, ask your health care provider if you should:  Be screened for bone loss.  Take a calcium or vitamin D supplement to lower your risk of fractures.  Be given hormone replacement therapy (HRT) to treat symptoms of menopause. Follow these instructions at home: Lifestyle  Do not use any products that contain nicotine or tobacco, such as cigarettes, e-cigarettes, and chewing tobacco. If you need help quitting, ask your health care provider.  Do not use street drugs.  Do not share needles.  Ask your health care provider for help if you need support or information about quitting drugs. Alcohol use  Do not drink alcohol if: ? Your health care provider tells you not to drink. ? You are pregnant, may be pregnant, or are planning to become pregnant.  If you drink alcohol: ? Limit how much you use to 0-1 drink a day. ? Limit intake if you are breastfeeding.  Be aware of how much alcohol is in your drink. In the [place] one drink equals one 12 oz bottle of beer (355 mL), one 5 oz glass of wine (148 mL), or one 1 oz glass of hard liquor (44 mL). General instructions  Schedule regular health, dental, and eye exams.  Stay current with your vaccines.  Tell your health care provider if: ? You often feel depressed. ? You have ever been abused or do not feel safe at home. Summary  Adopting a healthy lifestyle and getting preventive care are important in promoting health and wellness.  Follow your health care provider's instructions about healthy  diet, exercising, and getting tested or screened for diseases.  Follow your health care provider's instructions on monitoring your cholesterol and blood pressure. This information is not intended to replace advice given to you by your health care provider. Make sure you discuss any questions you have with your health care provider. Document Released: 11/30/2010 Document Revised: 05/10/2018 Document Reviewed: 05/10/2018 Elsevier Patient Education  2020 [employer]  

## 2019-03-19 NOTE — Progress Notes (Signed)
Subjective:     Patient ID: Evelyn Orr , female    DOB: [birth year] , 73 y.o.   MRN: [id number]   Chief Complaint  Patient presents with  . Thyroid Problem    HPI  She is here today for f/u hypothyroidism. She would also like to have a physical examination.   Thyroid Problem Presents for follow-up visit. Patient reports no cold intolerance, constipation, depressed mood, diaphoresis, diarrhea, heat intolerance, palpitations or tremors. The symptoms have been stable.     Past Medical History:  Diagnosis Date  . Arthritis   . Breast cancer ([place])   . Breast cancer of upper-outer quadrant of right female breast ([place]) 03/20/2015  . Cough   . Hypothyroidism   . Nasal congestion   . PONV (postoperative nausea and vomiting)   . Rheumatoid arthritis(714.0)   . Seasonal allergies   . Thyroid disease    hypothyroidism     Family History  Problem Relation Age of Onset  . Heart disease Father   . Heart attack Father   . Hypertension Mother   . Breast cancer Paternal Aunt        dx. 78 or younger  . Colon cancer Paternal Uncle   . Prostate cancer Maternal Uncle   . Other Sister        high breast density  . Prostate cancer Brother 51       unknown Gleason  . Prostate cancer Maternal Grandfather        dx. older age  . Colon polyps Son 2       colonoscopy for unspecified reason; found some colon polyps, has not had one since  . Prostate cancer Maternal Uncle   . Prostate cancer Maternal Uncle   . Colon cancer Other   . Cancer Other        MGM's sister  . Breast cancer Cousin        4 maternal cousin had breast cancer   . Rectal cancer Neg Hx   . Stomach cancer Neg Hx      Current Outpatient Medications:  .  anastrozole (ARIMIDEX) 1 MG tablet, TAKE 1 TABLET BY MOUTH EVERY DAY, Disp: 90 tablet, Rfl: 3 .  Azelastine HCl 0.15 % SOLN, 1 SPRAY EACH NOSTRIL DAILY AS NEEDED FOR NASAL CONGESTION, Disp: , Rfl: 1 .  benzonatate (TESSALON) 100 MG capsule, Take 1 capsule  (100 mg total) by mouth 2 (two) times daily as needed for cough., Disp: 30 capsule, Rfl: 1 .  clotrimazole-betamethasone (LOTRISONE) cream, clotrimazole-betamethasone 1 %-0.05 % topical cream  APPLY TO AFFECTED AND SURROUNDING AREAS OF SKIN 2 TIMES A DAY, MORNING AND EVENING FOR 2 WEEKS, Disp: , Rfl:  .  levothyroxine (SYNTHROID) 88 MCG tablet, TAKE 1 TABLET BY MOUTH EVERY DAY, Disp: 90 tablet, Rfl: 2 .  methotrexate (RHEUMATREX) 2.5 MG tablet, Take 2.5 mg by mouth once a week. 8 tablets weekly, Disp: , Rfl: 1 .  Vitamin D, Ergocalciferol, (DRISDOL) 1.25 MG (50000 UT) CAPS capsule, TAKE 1 CAPSULE BY MOUTH 2 TIMES EVERY WEEK, Disp: 8 capsule, Rfl: 0 .  Cholecalciferol (VITAMIN D-3) 5000 units TABS, Take 5,000 Units by mouth daily., Disp: , Rfl:    No Known Allergies   Review of Systems  Constitutional: Negative.  Negative for diaphoresis.  HENT: Negative.   Eyes: Negative.   Respiratory: Negative.   Cardiovascular: Negative.  Negative for palpitations.  Gastrointestinal: Negative for constipation and diarrhea.  Endocrine: Negative.  Negative for cold intolerance and  heat intolerance.  Genitourinary: Negative.        She c/o vaginal itching. She tried an OTC product without any improvement in her symptoms. She denies vaginal discharge.   Musculoskeletal: Negative.   Skin: Negative.   Allergic/Immunologic: Negative.   Neurological: Negative.  Negative for tremors.  Hematological: Negative.   Psychiatric/Behavioral: Negative.      Today's Vitals   03/19/19 0842  BP: 126/84  Pulse: 74  Temp: 98.2 F (36.8 C)  TempSrc: Oral  Weight: 164 lb 12.8 oz (74.8 kg)  Height: 5' 1.4" (1.56 m)  PainSc: 0-No pain   Body mass index is 30.73 kg/m.   Objective:  Physical Exam Vitals signs and nursing note reviewed.  Constitutional:      Appearance: Normal appearance.  HENT:     Head: Normocephalic and atraumatic.     Right Ear: Tympanic membrane, ear canal and external ear normal.      Left Ear: Tympanic membrane, ear canal and external ear normal.     Nose: Nose normal.     Mouth/Throat:     Mouth: Mucous membranes are moist.     Pharynx: Oropharynx is clear.  Eyes:     Extraocular Movements: Extraocular movements intact.     Conjunctiva/sclera: Conjunctivae normal.     Pupils: Pupils are equal, round, and reactive to light.  Neck:     Musculoskeletal: Normal range of motion and neck supple.  Cardiovascular:     Rate and Rhythm: Normal rate and regular rhythm.     Pulses: Normal pulses.     Heart sounds: Normal heart sounds.  Pulmonary:     Effort: Pulmonary effort is normal.     Breath sounds: Normal breath sounds.  Chest:     Breasts: Tanner Score is 5.        Right: Normal.        Left: Normal.     Comments: Healed surgical scars b/l breasts   Abdominal:     General: Abdomen is flat. Bowel sounds are normal.     Palpations: Abdomen is soft.  Genitourinary:    Comments: deferred Musculoskeletal: Normal range of motion.  Skin:    General: Skin is warm and dry.  Neurological:     General: No focal deficit present.     Mental Status: She is alert and oriented to person, place, and time.  Psychiatric:        Mood and Affect: Mood normal.        Behavior: Behavior normal.         Assessment And Plan:     1. Primary hypothyroidism  I will check thyroid panel and adjust meds as needed.  She will rto in six months for re-evaluation.   - TSH - T4, Free - Hepatitis C antibody  2. High cholesterol  Previous results from Oct 2019 were reviewed in full detail. She is aware that optimal LDL is less than 100.  She is encouraged to avoid fried foods, continue her regular exercise and to increase her fiber intake.   - Lipid panel  3. Vaginal itching  I will check her for yeast/BV. Urinalysis will also be performed today as well.   - Urine cytology ancillary only  4. Class 1 obesity due to excess calories with serious comorbidity and body mass index  (BMI) of 30.0 to 30.9 in adult  She was congratulated on her regular walking schedule. She is encouraged to walk at least 30 minutes five days weekly.  5. Routine general medical examination at health care facility  A full exam was performed.  She declined flu vaccine. Importance of monthly self breast exams was discussed with the patient. PATIENT HAS BEEN ADVISED TO GET 30-45 MINUTES REGULAR EXERCISE NO LESS THAN FOUR TO FIVE DAYS PER WEEK - BOTH WEIGHTBEARING EXERCISES AND AEROBIC ARE RECOMMENDED.  SHE WAS ADVISED TO FOLLOW A HEALTHY DIET WITH AT LEAST SIX FRUITS/VEGGIES PER DAY, DECREASE INTAKE OF RED MEAT, AND TO INCREASE FISH INTAKE TO TWO DAYS PER WEEK.  MEATS/FISH SHOULD NOT BE FRIED, BAKED OR BROILED IS PREFERABLE.  I SUGGEST WEARING SPF 50 SUNSCREEN ON EXPOSED PARTS AND ESPECIALLY WHEN IN THE DIRECT SUNLIGHT FOR AN EXTENDED PERIOD OF TIME.  PLEASE AVOID FAST FOOD RESTAURANTS AND INCREASE YOUR WATER INTAKE.   Maximino Greenland, MD    THE PATIENT IS ENCOURAGED TO PRACTICE SOCIAL DISTANCING DUE TO THE COVID-19 PANDEMIC.

## 2019-03-20 LAB — LIPID PANEL
Chol/HDL Ratio: 2.3 ratio (ref 0.0–4.4)
Cholesterol, Total: 129 mg/dL (ref 100–199)
HDL: 56 mg/dL (ref >39)
LDL Chol Calc (NIH): 56 mg/dL (ref 0–99)
Triglycerides: 87 mg/dL (ref 0–149)
VLDL Cholesterol Cal: 17 mg/dL (ref 5–40)

## 2019-03-20 LAB — TSH: TSH: 3.1 u[IU]/mL (ref 0.450–4.500)

## 2019-03-20 LAB — T4, FREE: Free T4: 1.54 ng/dL (ref 0.82–1.77)

## 2019-03-20 LAB — HEPATITIS C ANTIBODY: Hep C Virus Ab: <0.1 s/co ratio (ref 0.0–0.9)

## 2019-03-21 ENCOUNTER — Other Ambulatory Visit: Payer: Self-pay

## 2019-03-21 MED ORDER — VITAMIN D (ERGOCALCIFEROL) 1.25 MG (50000 UNIT) PO CAPS: ORAL_CAPSULE | ORAL | 0 refills | Status: DC

## 2019-03-23 ENCOUNTER — Encounter: Payer: Self-pay | Admitting: Gastroenterology

## 2019-04-05 ENCOUNTER — Encounter: Payer: Self-pay | Admitting: Hematology

## 2019-04-05 DIAGNOSIS — Z86000 Personal history of in-situ neoplasm of breast: Secondary | ICD-10-CM | POA: Diagnosis not present

## 2019-04-05 DIAGNOSIS — Z803 Family history of malignant neoplasm of breast: Secondary | ICD-10-CM | POA: Diagnosis not present

## 2019-04-10 ENCOUNTER — Other Ambulatory Visit: Payer: Self-pay

## 2019-04-10 ENCOUNTER — Ambulatory Visit (INDEPENDENT_AMBULATORY_CARE_PROVIDER_SITE_OTHER): Payer: Medicare Other

## 2019-04-10 VITALS — Temp 97.5°F | Ht 62.0 in | Wt 164.0 lb

## 2019-04-10 DIAGNOSIS — Z0000 Encounter for general adult medical examination without abnormal findings: Secondary | ICD-10-CM | POA: Diagnosis not present

## 2019-04-10 NOTE — Patient Instructions (Signed)
[name] , Thank you for taking time to come for your Medicare Wellness Visit. I appreciate your ongoing commitment to your health goals. Please review the following plan we discussed and let me know if I can assist you in the future.   Screening recommendations/referrals: Colonoscopy: 03/2014 Mammogram: 04/2019 Bone Density: 05/2018 Recommended yearly ophthalmology/optometry visit for glaucoma screening and checkup Recommended yearly dental visit for hygiene and checkup  Vaccinations: Influenza vaccine: declines Pneumococcal vaccine: declines Tdap vaccine: declines Shingles vaccine: declines    Advanced directives: Advance directive discussed with you today.  Conditions/risks identified: obesity  Next appointment: 09/18/2019 at 8:30   Preventive Care 71 Years and Older, Female Preventive care refers to lifestyle choices and visits with your health care provider that can promote health and wellness. What does preventive care include?  A yearly physical exam. This is also called an annual well check.  Dental exams once or twice a year.  Routine eye exams. Ask your health care provider how often you should have your eyes checked.  Personal lifestyle choices, including:  Daily care of your teeth and gums.  Regular physical activity.  Eating a healthy diet.  Avoiding tobacco and drug use.  Limiting alcohol use.  Practicing safe sex.  Taking low-dose aspirin every day.  Taking vitamin and mineral supplements as recommended by your health care provider. What happens during an annual well check? The services and screenings done by your health care provider during your annual well check will depend on your age, overall health, lifestyle risk factors, and family history of disease. Counseling  Your health care provider may ask you questions about your:  Alcohol use.  Tobacco use.  Drug use.  Emotional well-being.  Home and relationship well-being.  Sexual  activity.  Eating habits.  History of falls.  Memory and ability to understand (cognition).  Work and work Statistician.  Reproductive health. Screening  You may have the following tests or measurements:  Height, weight, and BMI.  Blood pressure.  Lipid and cholesterol levels. These may be checked every 5 years, or more frequently if you are over 60 years old.  Skin check.  Lung cancer screening. You may have this screening every year starting at age 59 if you have a 30-pack-year history of smoking and currently smoke or have quit within the past 15 years.  Fecal occult blood test (FOBT) of the stool. You may have this test every year starting at age 38.  Flexible sigmoidoscopy or colonoscopy. You may have a sigmoidoscopy every 5 years or a colonoscopy every 10 years starting at age 45.  Hepatitis C blood test.  Hepatitis B blood test.  Sexually transmitted disease (STD) testing.  Diabetes screening. This is done by checking your blood sugar (glucose) after you have not eaten for a while (fasting). You may have this done every 1-3 years.  Bone density scan. This is done to screen for osteoporosis. You may have this done starting at age 23.  Mammogram. This may be done every 1-2 years. Talk to your health care provider about how often you should have regular mammograms. Talk with your health care provider about your test results, treatment options, and if necessary, the need for more tests. Vaccines  Your health care provider may recommend certain vaccines, such as:  Influenza vaccine. This is recommended every year.  Tetanus, diphtheria, and acellular pertussis (Tdap, Td) vaccine. You may need a Td booster every 10 years.  Zoster vaccine. You may need this after age 28.  Pneumococcal 13-valent conjugate (PCV13) vaccine. One dose is recommended after age 83.  Pneumococcal polysaccharide (PPSV23) vaccine. One dose is recommended after age 48. Talk to your health care  provider about which screenings and vaccines you need and how often you need them. This information is not intended to replace advice given to you by your health care provider. Make sure you discuss any questions you have with your health care provider. Document Released: 06/13/2015 Document Revised: 02/04/2016 Document Reviewed: 03/18/2015 Elsevier Interactive Patient Education  2017 [place] Prevention in the Home Falls can cause injuries. They can happen to people of all ages. There are many things you can do to make your home safe and to help prevent falls. What can I do on the outside of my home?  Regularly fix the edges of walkways and driveways and fix any cracks.  Remove anything that might make you trip as you walk through a door, such as a raised step or threshold.  Trim any bushes or trees on the path to your home.  Use bright outdoor lighting.  Clear any walking paths of anything that might make someone trip, such as rocks or tools.  Regularly check to see if handrails are loose or broken. Make sure that both sides of any steps have handrails.  Any raised decks and porches should have guardrails on the edges.  Have any leaves, snow, or ice cleared regularly.  Use sand or salt on walking paths during winter.  Clean up any spills in your garage right away. This includes oil or grease spills. What can I do in the bathroom?  Use night lights.  Install grab bars by the toilet and in the tub and shower. Do not use towel bars as grab bars.  Use non-skid mats or decals in the tub or shower.  If you need to sit down in the shower, use a plastic, non-slip stool.  Keep the floor dry. Clean up any water that spills on the floor as soon as it happens.  Remove soap buildup in the tub or shower regularly.  Attach bath mats securely with double-sided non-slip rug tape.  Do not have throw rugs and other things on the floor that can make you trip. What can I do in  the bedroom?  Use night lights.  Make sure that you have a light by your bed that is easy to reach.  Do not use any sheets or blankets that are too big for your bed. They should not hang down onto the floor.  Have a firm chair that has side arms. You can use this for support while you get dressed.  Do not have throw rugs and other things on the floor that can make you trip. What can I do in the kitchen?  Clean up any spills right away.  Avoid walking on wet floors.  Keep items that you use a lot in easy-to-reach places.  If you need to reach something above you, use a strong step stool that has a grab bar.  Keep electrical cords out of the way.  Do not use floor polish or wax that makes floors slippery. If you must use wax, use non-skid floor wax.  Do not have throw rugs and other things on the floor that can make you trip. What can I do with my stairs?  Do not leave any items on the stairs.  Make sure that there are handrails on both sides of the stairs and use them.  Fix handrails that are broken or loose. Make sure that handrails are as long as the stairways.  Check any carpeting to make sure that it is firmly attached to the stairs. Fix any carpet that is loose or worn.  Avoid having throw rugs at the top or bottom of the stairs. If you do have throw rugs, attach them to the floor with carpet tape.  Make sure that you have a light switch at the top of the stairs and the bottom of the stairs. If you do not have them, ask someone to add them for you. What else can I do to help prevent falls?  Wear shoes that:  Do not have high heels.  Have rubber bottoms.  Are comfortable and fit you well.  Are closed at the toe. Do not wear sandals.  If you use a stepladder:  Make sure that it is fully opened. Do not climb a closed stepladder.  Make sure that both sides of the stepladder are locked into place.  Ask someone to hold it for you, if possible.  Clearly mark and  make sure that you can see:  Any grab bars or handrails.  First and last steps.  Where the edge of each step is.  Use tools that help you move around (mobility aids) if they are needed. These include:  Canes.  Walkers.  Scooters.  Crutches.  Turn on the lights when you go into a dark area. Replace any light bulbs as soon as they burn out.  Set up your furniture so you have a clear path. Avoid moving your furniture around.  If any of your floors are uneven, fix them.  If there are any pets around you, be aware of where they are.  Review your medicines with your doctor. Some medicines can make you feel dizzy. This can increase your chance of falling. Ask your doctor what other things that you can do to help prevent falls. This information is not intended to replace advice given to you by your health care provider. Make sure you discuss any questions you have with your health care provider. Document Released: 03/13/2009 Document Revised: 10/23/2015 Document Reviewed: 06/21/2014 Elsevier Interactive Patient Education  2017 [employer].

## 2019-04-10 NOTE — Progress Notes (Signed)
Subjective:   Evelyn Orr is a 73 y.o. female who presents for Medicare Annual (Subsequent) preventive examination.  This visit type was conducted due to national recommendations for restrictions regarding the COVID-19 Pandemic (e.g. social distancing). This format is felt to be most appropriate for this patient at this time. All issues noted in this document were discussed and addressed. No physical exam was performed (except for noted visual exam findings with Video Visits). The patient, Evelyn Orr, has given consent to perform this visit via video. Patient had difficulty connecting via video, so visit was completed in audio only. Vital signs may be absent or patient reported.   Patient location:  At home   Nurse location:  [place] office   Review of Systems:  n/a Cardiac Risk Factors include: advanced age (>70men, >56 women);obesity (BMI >30kg/m2)     Objective:     Vitals: Temp (!) 97.5 F (36.4 C) Comment: per patient  Ht 5\' 2"  (1.575 m) Comment: per patient  Wt 164 lb (74.4 kg) Comment: per patient  BMI 30.00 kg/m   Body mass index is 30 kg/m.  Advanced Directives 04/10/2019 06/14/2017 06/21/2016 06/15/2016 07/21/2015 05/22/2015 05/01/2015  Does Patient Have a Medical Advance Directive? No No No No No No No  Would patient like information on creating a medical advance directive? - No - Patient declined No - Patient declined - No - patient declined information - Yes - Scientist, clinical (histocompatibility and immunogenetics) given    Tobacco Social History   Tobacco Use  Smoking Status Former Smoker  . Packs/day: 0.25  . Years: 21.00  . Pack years: 5.25  . Types: Cigarettes  . Quit date: 02/13/1984  . Years since quitting: 35.1  Smokeless Tobacco Never Used     Counseling given: Not Answered   Clinical Intake:  Pre-visit preparation completed: Yes  Pain : No/denies pain     Nutritional Status: BMI > 30  Obese Nutritional Risks: None Diabetes: No  How often do you need to  have someone help you when you read instructions, pamphlets, or other written materials from your doctor or pharmacy?: 1 - Never What is the last grade level you completed in school?: 12th grade  Interpreter Needed?: No  Information entered by :: NAllen LPN  Past Medical History:  Diagnosis Date  . Arthritis   . Breast cancer ([place])   . Breast cancer of upper-outer quadrant of right female breast ([place]) 03/20/2015  . Cough   . Hypothyroidism   . Nasal congestion   . PONV (postoperative nausea and vomiting)   . Rheumatoid arthritis(714.0)   . Seasonal allergies   . Thyroid disease    hypothyroidism   Past Surgical History:  Procedure Laterality Date  . BREAST LUMPECTOMY WITH RADIOACTIVE SEED LOCALIZATION Right 04/09/2015   Procedure: BREAST LUMPECTOMY WITH RADIOACTIVE SEED LOCALIZATION;  Surgeon: Rolm Bookbinder, MD;  Location: [facility];  Service: General;  Laterality: Right;  . BREAST REDUCTION SURGERY  2001  . CALCANEAL OSTEOTOMY Left 10/11/2013   Procedure: LEFT CALCANEAL OSTEOTOMY;  Surgeon: Wylene Simmer, MD;  Location: [facility];  Service: Orthopedics;  Laterality: Left;  . CARPAL TUNNEL RELEASE  2006   rt  . CARPAL TUNNEL RELEASE     left  . CERVICAL FUSION  2009  . COLONOSCOPY     2002,2005,2008 w/[facility]  . GASTROCNEMIUS RECESSION Left 10/11/2013   Procedure: LEFT GASTROC RECESSION; LEFT POSTERIOR TIBIAL TENOLYSIS;  Surgeon: Wylene Simmer, MD;  Location: [facility];  Service: Orthopedics;  Laterality: Left;  . KNEE ARTHROSCOPY  2004   right  . POLYPECTOMY    . REMOVAL OF IMPLANT Left 02/14/2014   Procedure: LEFT FOOT REMOVAL OF DEEP IMPLANT;  Surgeon: Wylene Simmer, MD;  Location: [facility];  Service: Orthopedics;  Laterality: Left;  . SHOULDER ARTHROSCOPY W/ ROTATOR CUFF REPAIR  2006   right  . THYROIDECTOMY     age 46  . TONSILLECTOMY    . TOTAL KNEE ARTHROPLASTY Right 06/21/2016   Procedure: RIGHT TOTAL  KNEE ARTHROPLASTY;  Surgeon: Paralee Cancel, MD;  Location: [facility];  Service: Orthopedics;  Laterality: Right;  . TUBAL LIGATION    . WRIST ARTHRODESIS  2003   left    Family History  Problem Relation Age of Onset  . Heart disease Father   . Heart attack Father   . Hypertension Mother   . Breast cancer Paternal Aunt        dx. 19 or younger  . Colon cancer Paternal Uncle   . Prostate cancer Maternal Uncle   . Other Sister        high breast density  . Prostate cancer Brother 59       unknown Gleason  . Prostate cancer Maternal Grandfather        dx. older age  . Colon polyps Son 8       colonoscopy for unspecified reason; found some colon polyps, has not had one since  . Prostate cancer Maternal Uncle   . Prostate cancer Maternal Uncle   . Colon cancer Other   . Cancer Other        MGM's sister  . Breast cancer Cousin        4 maternal cousin had breast cancer   . Rectal cancer Neg Hx   . Stomach cancer Neg Hx    Social History   Socioeconomic History  . Marital status: Married    Spouse name: Not on file  . Number of children: Not on file  . Years of education: Not on file  . Highest education level: Not on file  Occupational History  . Occupation: retired  Scientific laboratory technician  . Financial resource strain: Not hard at all  . Food insecurity    Worry: Never true    Inability: Never true  . Transportation needs    Medical: No    Non-medical: No  Tobacco Use  . Smoking status: Former Smoker    Packs/day: 0.25    Years: 21.00    Pack years: 5.25    Types: Cigarettes    Quit date: 02/13/1984    Years since quitting: 35.1  . Smokeless tobacco: Never Used  Substance and Sexual Activity  . Alcohol use: No  . Drug use: No  . Sexual activity: Yes    Birth control/protection: Post-menopausal  Lifestyle  . Physical activity    Days per week: 4 days    Minutes per session: 60 min  . Stress: Not at all  Relationships  . Social Herbalist on phone: Not on  file    Gets together: Not on file    Attends religious service: Not on file    Active member of club or organization: Not on file    Attends meetings of clubs or organizations: Not on file    Relationship status: Not on file  Other Topics Concern  . Not on file  Social History Narrative  . Not on file  Outpatient Encounter Medications as of 04/10/2019  Medication Sig  . anastrozole (ARIMIDEX) 1 MG tablet TAKE 1 TABLET BY MOUTH EVERY DAY  . Azelastine HCl 0.15 % SOLN 1 SPRAY EACH NOSTRIL DAILY AS NEEDED FOR NASAL CONGESTION  . benzonatate (TESSALON) 100 MG capsule Take 1 capsule (100 mg total) by mouth 2 (two) times daily as needed for cough.  . clotrimazole-betamethasone (LOTRISONE) cream clotrimazole-betamethasone 1 %-0.05 % topical cream  APPLY TO AFFECTED AND SURROUNDING AREAS OF SKIN 2 TIMES A DAY, MORNING AND EVENING FOR 2 WEEKS  . levothyroxine (SYNTHROID) 88 MCG tablet TAKE 1 TABLET BY MOUTH EVERY DAY  . methotrexate (RHEUMATREX) 2.5 MG tablet Take 2.5 mg by mouth once a week. 8 tablets weekly  . Vitamin D, Ergocalciferol, (DRISDOL) 1.25 MG (50000 UT) CAPS capsule TAKE 1 CAPSULE BY MOUTH 2 TIMES EVERY WEEK  . Cholecalciferol (VITAMIN D-3) 5000 units TABS Take 5,000 Units by mouth daily.   No facility-administered encounter medications on file as of 04/10/2019.     Activities of Daily Living In your present state of health, do you have any difficulty performing the following activities: 04/10/2019  Hearing? N  Vision? N  Difficulty concentrating or making decisions? N  Walking or climbing stairs? Y  Comment due to knees  Dressing or bathing? N  Doing errands, shopping? N  Preparing Food and eating ? N  Using the Toilet? N  In the past six months, have you accidently leaked urine? N  Do you have problems with loss of bowel control? N  Managing your Medications? N  Managing your Finances? N  Housekeeping or managing your Housekeeping? N  Some recent data might be  hidden    Patient Care Team: [name], MD as PCP - General (Internal Medicine) Earnstine Regal, PA-C as Physician Assistant (Obstetrics and Gynecology) Rolm Bookbinder, MD as Consulting Physician (General Surgery) Truitt Merle, MD as Consulting Physician (Hematology) Thea Silversmith, MD as Consulting Physician (Radiation Oncology) Mauro Kaufmann, RN as Registered Nurse Rockwell Germany, RN as Registered Nurse Jake Shark Johny Blamer, NP as Nurse Practitioner (Hematology and Oncology)    Assessment:   This is a routine wellness examination for Evelyn Orr.  Exercise Activities and Dietary recommendations Current Exercise Habits: Home exercise routine, Type of exercise: walking, Time (Minutes): 60, Frequency (Times/Week): 4, Weekly Exercise (Minutes/Week): 240  Goals    . DIET - INCREASE WATER INTAKE     04/10/2019, wants to increase water intake by drinking through out the day       Fall Risk Fall Risk  04/10/2019 03/19/2019 07/03/2018 03/13/2018 03/01/2018  Falls in the past year? 0 0 0 No No  Comment - - - - -  Number falls in past yr: 0 - - - -  Comment - - - - -  Injury with Fall? - - - - -  Risk for fall due to : Medication side effect - - - -  Follow up Falls evaluation completed;Falls prevention discussed - - - -   Is the patient's home free of loose throw rugs in walkways, pet beds, electrical cords, etc?   yes      Grab bars in the bathroom? no      Handrails on the stairs?   n/a      Adequate lighting?   yes  Timed Get Up and Go performed: n/a  Depression Screen PHQ 2/9 Scores 04/10/2019 03/19/2019 07/03/2018 03/13/2018  PHQ - 2 Score 0 0 0 0  PHQ- 9 Score 3 - - -  Cognitive Function     6CIT Screen 04/10/2019  What Year? 0 points  What month? 0 points  What time? 0 points  Count back from 20 0 points  Months in reverse 0 points  Repeat phrase 2 points  Total Score 2     There is no immunization history on file for this patient.  Qualifies  for Shingles Vaccine? yes  Screening Tests Health Maintenance  Topic Date Due  . INFLUENZA VACCINE  08/29/2019 (Originally 12/30/2018)  . TETANUS/TDAP  03/18/2020 (Originally 03/01/1965)  . PNA vac Low Risk Adult (1 of 2 - PCV13) 03/18/2020 (Originally 02/28/2013)  . COLONOSCOPY  03/31/2021 (Originally 04/03/2019)  . MAMMOGRAM  04/04/2021  . DEXA SCAN  Completed  . Hepatitis C Screening  Completed    Cancer Screenings: Lung: Low Dose CT Chest recommended if Age 12-80 years, 30 pack-year currently smoking OR have quit w/in 15years. Patient does not qualify. Breast:  Up to date on Mammogram? Yes   Up to date of Bone Density/Dexa? Yes Colorectal: up to date  Additional Screenings: : Hepatitis C Screening: 03/2019     Plan:    Patient declines all vaccinations. She would like to increase water intake by drinking throughout the day.   I have personally reviewed and noted the following in the patient's chart:   . Medical and social history . Use of alcohol, tobacco or illicit drugs  . Current medications and supplements . Functional ability and status . Nutritional status . Physical activity . Advanced directives . List of other physicians . Hospitalizations, surgeries, and ER visits in previous 12 months . Vitals . Screenings to include cognitive, depression, and falls . Referrals and appointments  In addition, I have reviewed and discussed with patient certain preventive protocols, quality metrics, and best practice recommendations. A written personalized care plan for preventive services as well as general preventive health recommendations were provided to patient.     Kellie Simmering, LPN  [phone]

## 2019-04-14 ENCOUNTER — Other Ambulatory Visit: Payer: Self-pay | Admitting: Internal Medicine

## 2019-04-18 ENCOUNTER — Other Ambulatory Visit: Payer: Self-pay

## 2019-04-18 MED ORDER — MOMETASONE FUROATE 50 MCG/ACT NA SUSP: 2.0000 | Freq: Every day | NASAL | 1 refills | Status: DC

## 2019-04-23 ENCOUNTER — Telehealth: Payer: Self-pay | Admitting: *Deleted

## 2019-04-23 NOTE — Telephone Encounter (Signed)
Pt called requested the name or series of the [employer] shoes Dr. Elisha Ponder preferred.

## 2019-04-23 NOTE — Telephone Encounter (Signed)
I spoke with pt and informed that the high number or running series is preferred due to firmness of the shoe and wider toe box.

## 2019-04-30 DIAGNOSIS — Z01419 Encounter for gynecological examination (general) (routine) without abnormal findings: Secondary | ICD-10-CM | POA: Diagnosis not present

## 2019-04-30 DIAGNOSIS — B354 Tinea corporis: Secondary | ICD-10-CM | POA: Diagnosis not present

## 2019-04-30 DIAGNOSIS — Z6830 Body mass index (BMI) 30.0-30.9, adult: Secondary | ICD-10-CM | POA: Diagnosis not present

## 2019-05-08 ENCOUNTER — Other Ambulatory Visit: Payer: Self-pay

## 2019-05-08 ENCOUNTER — Encounter: Payer: Self-pay | Admitting: Sports Medicine

## 2019-05-08 ENCOUNTER — Ambulatory Visit (INDEPENDENT_AMBULATORY_CARE_PROVIDER_SITE_OTHER): Payer: Medicare Other | Admitting: Sports Medicine

## 2019-05-08 DIAGNOSIS — M79672 Pain in left foot: Secondary | ICD-10-CM | POA: Diagnosis not present

## 2019-05-08 DIAGNOSIS — Q828 Other specified congenital malformations of skin: Secondary | ICD-10-CM | POA: Diagnosis not present

## 2019-05-08 DIAGNOSIS — M216X2 Other acquired deformities of left foot: Secondary | ICD-10-CM | POA: Diagnosis not present

## 2019-05-08 DIAGNOSIS — L819 Disorder of pigmentation, unspecified: Secondary | ICD-10-CM | POA: Diagnosis not present

## 2019-05-08 NOTE — Progress Notes (Signed)
Subjective: Evelyn Orr is a 73 y.o. female patient who presents to office for evaluation of Left foot pain secondary to callus skin that she sees Dr. Adah Perl for and it is hurting. Reports also that she is concerned because she noticed the 2nd toe is discoloration feels numb and looks darker than the others but does not hurt.  Denies any trauma or injury to this toe in particular reports that she soak with Epson salt which seems to help however wanted to have the toe checked glucose is concerned about the slight change in color. Patient denies any other pedal complaints.   Patient Active Problem List   Diagnosis Date Noted  . Routine general medical examination at a health care facility 03/11/2018  . Upper airway cough syndrome 09/22/2017  . Osteopenia 12/16/2016  . Overweight (BMI 25.0-29.9) 06/23/2016  . S/P right TKA 06/21/2016  . Genetic testing 04/14/2015  . Family history of breast cancer 04/01/2015  . Breast cancer of upper-outer quadrant of right female breast ([place]) 03/20/2015  . Intraductal carcinoma in situ of breast 03/20/2015  . Flatfoot 10/11/2013  . Tendonitis, Achilles, right 07/31/2013  . Metatarsal deformity 07/31/2013  . Pain in lower limb 07/31/2013  . Rheumatoid arthritis ([place]) 03/17/2012  . Thyroid disease 03/17/2012    Current Outpatient Medications on File Prior to Visit  Medication Sig Dispense Refill  . anastrozole (ARIMIDEX) 1 MG tablet TAKE 1 TABLET BY MOUTH EVERY DAY 90 tablet 3  . Azelastine HCl 0.15 % SOLN 1 SPRAY EACH NOSTRIL DAILY AS NEEDED FOR NASAL CONGESTION  1  . benzonatate (TESSALON) 100 MG capsule Take 1 capsule (100 mg total) by mouth 2 (two) times daily as needed for cough. 30 capsule 1  . Cholecalciferol (VITAMIN D-3) 5000 units TABS Take 5,000 Units by mouth daily.    . clotrimazole-betamethasone (LOTRISONE) cream clotrimazole-betamethasone 1 %-0.05 % topical cream  APPLY TO AFFECTED AND SURROUNDING AREAS OF SKIN 2 TIMES A DAY, MORNING  AND EVENING FOR 2 WEEKS    . levothyroxine (SYNTHROID) 88 MCG tablet TAKE 1 TABLET BY MOUTH EVERY DAY 90 tablet 2  . methotrexate (RHEUMATREX) 2.5 MG tablet Take 2.5 mg by mouth once a week. 8 tablets weekly  1  . mometasone (NASONEX) 50 MCG/ACT nasal spray Place 2 sprays into the nose daily. 17 g 1  . Vitamin D, Ergocalciferol, (DRISDOL) 1.25 MG (50000 UT) CAPS capsule TAKE 1 CAPSULE BY MOUTH 2 TIMES EVERY WEEK 8 capsule 0   No current facility-administered medications on file prior to visit.     No Known Allergies  Objective:  General: Alert and oriented x3 in no acute distress   Dermatology: Keratotic lesion present submet 5 on left skin lines transversing the lesion, pain is present with direct pressure to the lesion with a central nucleated core noted, no webspace macerations, no ecchymosis bilateral, all nails x 10 are well manicured but are short and thick.  There is no significant concerning color to the left second toe.  Vascular: Dorsalis Pedis and Posterior Tibial pedal pulses 2/4, Capillary Fill Time 3 seconds, + pedal hair growth bilateral, no edema bilateral lower extremities, Temperature gradient within normal limits.  Neurology: Johney Maine sensation intact via light touch bilateral.  Musculoskeletal: Mild tenderness with palpation at the keratotic lesion site on left submet 5 on left with prominent metatarsal head, Muscular strength 5/5 in all groups without pain or limitation on range of motion. No pain with palpation to the left second toe. Assessment and Plan:  Problem List Items Addressed This Visit    None    Visit Diagnoses    Porokeratosis    -  Primary   Prominent metatarsal head of left foot       Discoloration of skin of toe       Left foot pain         -Complete examination performed -Discussed treatment options for painful callus with prominent metatarsal and discoloration at second toe -Advised patient to continue at this time with moderately second toe since  there are no acute findings that suggest any vascular compromise or any acute infection or any acute concerns for the discoloration of which I do not appreciate at today's visit on the left second toe -Parred keratoic lesion using a chisel submet 5 on left without charge and discussed with patient in the future if continues to build up or become painful may benefit from fifth metatarsal head resection -Encouraged daily skin emollients -Encouraged use of pumice stone -Advised good supportive shoes and inserts -Patient to return to office scheduled with Dr. Adah Perl for routine trim advised patient if continues to be painful should consider surgery or sooner if condition worsens.  Landis Martins, DPM

## 2019-05-10 ENCOUNTER — Other Ambulatory Visit: Payer: Self-pay | Admitting: Internal Medicine

## 2019-05-10 DIAGNOSIS — M25462 Effusion, left knee: Secondary | ICD-10-CM | POA: Diagnosis not present

## 2019-05-10 DIAGNOSIS — M25562 Pain in left knee: Secondary | ICD-10-CM | POA: Diagnosis not present

## 2019-05-10 DIAGNOSIS — M1712 Unilateral primary osteoarthritis, left knee: Secondary | ICD-10-CM | POA: Diagnosis not present

## 2019-05-12 ENCOUNTER — Other Ambulatory Visit: Payer: Self-pay | Admitting: Internal Medicine

## 2019-05-16 ENCOUNTER — Other Ambulatory Visit: Payer: Self-pay | Admitting: Internal Medicine

## 2019-05-29 ENCOUNTER — Telehealth (INDEPENDENT_AMBULATORY_CARE_PROVIDER_SITE_OTHER): Payer: Medicare Other | Admitting: Nurse Practitioner

## 2019-05-29 ENCOUNTER — Encounter: Payer: Self-pay | Admitting: Nurse Practitioner

## 2019-05-29 ENCOUNTER — Other Ambulatory Visit: Payer: Self-pay

## 2019-05-29 ENCOUNTER — Telehealth: Payer: Self-pay

## 2019-05-29 VITALS — Wt 164.0 lb

## 2019-05-29 DIAGNOSIS — Z13820 Encounter for screening for osteoporosis: Secondary | ICD-10-CM | POA: Diagnosis not present

## 2019-05-29 DIAGNOSIS — M0589 Other rheumatoid arthritis with rheumatoid factor of multiple sites: Secondary | ICD-10-CM | POA: Diagnosis not present

## 2019-05-29 DIAGNOSIS — J029 Acute pharyngitis, unspecified: Secondary | ICD-10-CM | POA: Diagnosis not present

## 2019-05-29 DIAGNOSIS — Z20828 Contact with and (suspected) exposure to other viral communicable diseases: Secondary | ICD-10-CM | POA: Diagnosis not present

## 2019-05-29 DIAGNOSIS — M79646 Pain in unspecified finger(s): Secondary | ICD-10-CM | POA: Diagnosis not present

## 2019-05-29 DIAGNOSIS — H9202 Otalgia, left ear: Secondary | ICD-10-CM | POA: Diagnosis not present

## 2019-05-29 DIAGNOSIS — Z79899 Other long term (current) drug therapy: Secondary | ICD-10-CM | POA: Diagnosis not present

## 2019-05-29 MED ORDER — AMOXICILLIN 875 MG PO TABS: 875.0000 mg | ORAL_TABLET | Freq: Two times a day (BID) | ORAL | 0 refills | Status: DC

## 2019-05-29 NOTE — Patient Instructions (Signed)
She is advised to self isolate and to go for covid testing

## 2019-05-29 NOTE — Progress Notes (Signed)
Virtual Visit via Video   This visit type was conducted due to national recommendations for restrictions regarding the COVID-19 Pandemic (e.g. social distancing) in an effort to limit this patient's exposure and mitigate transmission in our community.  Due to her co-morbid illnesses, this patient is at least at moderate risk for complications without adequate follow up.  This format is felt to be most appropriate for this patient at this time.  All issues noted in this document were discussed and addressed.  A limited physical exam was performed with this format.    This visit type was conducted due to national recommendations for restrictions regarding the COVID-19 Pandemic (e.g. social distancing) in an effort to limit this patient's exposure and mitigate transmission in our community.  Patients identity confirmed using two different identifiers.  This format is felt to be most appropriate for this patient at this time.  All issues noted in this document were discussed and addressed.  No physical exam was performed (except for noted visual exam findings with Video Visits).    Date:  05/31/2019   ID:  Evelyn Orr, DOB 1946/01/12, MRN [id number]  Patient Location:  Home -spoke with [facility]  Provider location:   Office    Chief Complaint:  Ear pain and cough  History of Present Illness:    Evelyn Orr is a 73 y.o. female who presents via video conferencing for a telehealth visit today.    The patient does have symptoms concerning for COVID-19 infection (fever, chills, cough, or new shortness of breath).   She went to [employer] on Sunday was sitting by a door with a draft.  She had an ear pain left and sore throat.  She has taken 4 amoxicillin tablets.    Otalgia  There is pain in the left ear. This is a recurrent problem. The current episode started in the past 7 days. The problem occurs constantly. There has been no fever. The fever has been present for less  than 1 day. The patient is experiencing no pain. Pertinent negatives include no abdominal pain, coughing or hearing loss. Treatments tried: aspirin. There is no history of a chronic ear infection or hearing loss.  Sore Throat  Associated symptoms include ear pain (left ear pain). Pertinent negatives include no abdominal pain or coughing.     Past Medical History:  Diagnosis Date  . Arthritis   . Breast cancer ([facility])   . Breast cancer of upper-outer quadrant of right female breast ([facility]) 03/20/2015  . Cough   . Hypothyroidism   . Nasal congestion   . PONV (postoperative nausea and vomiting)   . Rheumatoid arthritis(714.0)   . Seasonal allergies   . Thyroid disease    hypothyroidism   Past Surgical History:  Procedure Laterality Date  . BREAST LUMPECTOMY WITH RADIOACTIVE SEED LOCALIZATION Right 04/09/2015   Procedure: BREAST LUMPECTOMY WITH RADIOACTIVE SEED LOCALIZATION;  Surgeon: Matthew Wakefield, MD;  Location: [facility];  Service: General;  Laterality: Right;  . BREAST REDUCTION SURGERY  2001  . CALCANEAL OSTEOTOMY Left 10/11/2013   Procedure: LEFT CALCANEAL OSTEOTOMY;  Surgeon: John Hewitt, MD;  Location: [facility];  Service: Orthopedics;  Laterality: Left;  . CARPAL TUNNEL RELEASE  2006   rt  . CARPAL TUNNEL RELEASE     left  . CERVICAL FUSION  2009  . COLONOSCOPY     20 02,2005,2008 w/Brodie  . GASTROCNEMIUS RECESSION Left 10/11/2013   Procedure: LEFT GASTROC RECESSION; LEFT POSTERIOR TIBIAL  TENOLYSIS;  Surgeon: Wylene Simmer, MD;  Location: [facility];  Service: Orthopedics;  Laterality: Left;  . KNEE ARTHROSCOPY  2004   right  . POLYPECTOMY    . REMOVAL OF IMPLANT Left 02/14/2014   Procedure: LEFT FOOT REMOVAL OF DEEP IMPLANT;  Surgeon: Wylene Simmer, MD;  Location: [facility];  Service: Orthopedics;  Laterality: Left;  . SHOULDER ARTHROSCOPY W/ ROTATOR CUFF REPAIR  2006   right  . THYROIDECTOMY     age 46  .  TONSILLECTOMY    . TOTAL KNEE ARTHROPLASTY Right 06/21/2016   Procedure: RIGHT TOTAL KNEE ARTHROPLASTY;  Surgeon: Paralee Cancel, MD;  Location: [facility];  Service: Orthopedics;  Laterality: Right;  . TUBAL LIGATION    . WRIST ARTHRODESIS  2003   left      Current Meds  Medication Sig  . anastrozole (ARIMIDEX) 1 MG tablet TAKE 1 TABLET BY MOUTH EVERY DAY  . Azelastine HCl 0.15 % SOLN 1 SPRAY EACH NOSTRIL DAILY AS NEEDED FOR NASAL CONGESTION  . benzonatate (TESSALON) 100 MG capsule Take 1 capsule (100 mg total) by mouth 2 (two) times daily as needed for cough.  . clotrimazole-betamethasone (LOTRISONE) cream clotrimazole-betamethasone 1 %-0.05 % topical cream  APPLY TO AFFECTED AND SURROUNDING AREAS OF SKIN 2 TIMES A DAY, MORNING AND EVENING FOR 2 WEEKS  . levothyroxine (SYNTHROID) 88 MCG tablet TAKE 1 TABLET BY MOUTH EVERY DAY  . methotrexate (RHEUMATREX) 2.5 MG tablet Take 2.5 mg by mouth once a week. 8 tablets weekly  . Vitamin D, Ergocalciferol, (DRISDOL) 1.25 MG (50000 UT) CAPS capsule TAKE 1 CAPSULE BY MOUTH 2 TIMES EVERY WEEK     Allergies:   Patient has no known allergies.   Social History   Tobacco Use  . Smoking status: Former Smoker    Packs/day: 0.25    Years: 21.00    Pack years: 5.25    Types: Cigarettes    Quit date: 02/13/1984    Years since quitting: 35.3  . Smokeless tobacco: Never Used  Substance Use Topics  . Alcohol use: No  . Drug use: No     Family Hx: The patient's family history includes Breast cancer in her cousin and paternal aunt; Cancer in an other family member; Colon cancer in her paternal uncle and another family member; Colon polyps (age of onset: 32) in her son; Heart attack in her father; Heart disease in her father; Hypertension in her mother; Other in her sister; Prostate cancer in her maternal grandfather, maternal uncle, maternal uncle, and maternal uncle; Prostate cancer (age of onset: 78) in her brother. There is no history of Rectal cancer or  Stomach cancer.  ROS:   Please see the history of present illness.    Review of Systems  HENT: Positive for ear pain (left ear pain). Negative for hearing loss.   Respiratory: Negative.  Negative for cough.   Cardiovascular: Negative.   Gastrointestinal: Negative for abdominal pain.  Neurological: Negative for dizziness and tingling.  Psychiatric/Behavioral: Negative.     All other systems reviewed and are negative.   Labs/Other Tests and Data Reviewed:    Recent Labs: 12/11/2018: ALT 8; BUN 8; Creatinine, Ser 0.77; Hemoglobin 13.0; Platelets 351; Potassium 3.7; Sodium 142 03/19/2019: TSH 3.100   Recent Lipid Panel Lab Results  Component Value Date/Time   CHOL 129 03/19/2019 10:01 AM   TRIG 87 03/19/2019 10:01 AM   HDL 56 03/19/2019 10:01 AM   CHOLHDL 2.3 03/19/2019 10:01 AM  LDLCALC 56 03/19/2019 10:01 AM    Wt Readings from Last 3 Encounters:  05/29/19 164 lb (74.4 kg)  04/10/19 164 lb (74.4 kg)  03/19/19 164 lb 12.8 oz (74.8 kg)     Exam:    Vital Signs:  Wt 164 lb (74.4 kg)   BMI 30.00 kg/m     Physical Exam  Constitutional: She is oriented to person, place, and time and well-developed, well-nourished, and in no distress. No distress.  Pulmonary/Chest: Effort normal. No respiratory distress.  Neurological: She is alert and oriented to person, place, and time.  Psychiatric: Mood, memory, affect and judgment normal.    ASSESSMENT & PLAN:    1. Left ear pain  She has already taken a few doses of left over amoxicillin discussed the importance of taking an antibiotic until completely gone to decrease risk for resistance  I will provide a total of 7 day course antibiotics in the event she has an infection - amoxicillin (AMOXIL) 875 MG tablet; Take 1 tablet (875 mg total) by mouth 2 (two) times daily.  Dispense: 10 tablet; Refill: 0  2. Sore throat  She is to go to [facility] for coronavirus testing has been in a restaurant in the last 2-3 days and with the current  increase in covid numbers feel is necessary to screen for coronavirus - amoxicillin (AMOXIL) 875 MG tablet; Take 1 tablet (875 mg total) by mouth 2 (two) times daily.  Dispense: 10 tablet; Refill: 0    COVID-19 Education: The signs and symptoms of COVID-19 were discussed with the patient and how to seek care for testing (follow up with PCP or arrange E-visit).  The importance of social distancing was discussed today.  Patient Risk:   After full review of this patients clinical status, I feel that they are at least moderate risk at this time.  Time:   Today, I have spent 10 minutes/ seconds with the patient with telehealth technology discussing above diagnoses.     Medication Adjustments/Labs and Tests Ordered: Current medicines are reviewed at length with the patient today.  Concerns regarding medicines are outlined above.   Tests Ordered: No orders of the defined types were placed in this encounter.   Medication Changes: Meds ordered this encounter  Medications  . amoxicillin (AMOXIL) 875 MG tablet    Sig: Take 1 tablet (875 mg total) by mouth 2 (two) times daily.    Dispense:  10 tablet    Refill:  0    Disposition:  Follow up prn  Signed, Minette Brine, FNP

## 2019-05-29 NOTE — Telephone Encounter (Signed)
SPOKE W/PATIENT APPT SCHEDULED VIRTUALLY.PT CONSENTED TO VIRTUAL VISIT

## 2019-05-30 ENCOUNTER — Ambulatory Visit: Payer: 59 | Attending: Internal Medicine

## 2019-05-30 DIAGNOSIS — Z20828 Contact with and (suspected) exposure to other viral communicable diseases: Secondary | ICD-10-CM | POA: Diagnosis not present

## 2019-05-30 DIAGNOSIS — Z20822 Contact with and (suspected) exposure to covid-19: Secondary | ICD-10-CM

## 2019-05-31 LAB — NOVEL CORONAVIRUS, NAA: SARS-CoV-2, NAA: NOT DETECTED

## 2019-06-08 ENCOUNTER — Ambulatory Visit: Payer: Medicare Other | Admitting: Podiatry

## 2019-06-14 DIAGNOSIS — M25562 Pain in left knee: Secondary | ICD-10-CM | POA: Diagnosis not present

## 2019-06-14 DIAGNOSIS — M1712 Unilateral primary osteoarthritis, left knee: Secondary | ICD-10-CM | POA: Diagnosis not present

## 2019-06-15 DIAGNOSIS — M0589 Other rheumatoid arthritis with rheumatoid factor of multiple sites: Secondary | ICD-10-CM | POA: Diagnosis not present

## 2019-06-18 ENCOUNTER — Encounter: Payer: Self-pay | Admitting: Internal Medicine

## 2019-06-18 ENCOUNTER — Telehealth: Payer: Self-pay

## 2019-06-18 ENCOUNTER — Ambulatory Visit (INDEPENDENT_AMBULATORY_CARE_PROVIDER_SITE_OTHER): Payer: Medicare Other | Admitting: Internal Medicine

## 2019-06-18 ENCOUNTER — Other Ambulatory Visit: Payer: Self-pay

## 2019-06-18 VITALS — BP 118/78 | HR 82 | Temp 98.6°F | Ht 61.0 in | Wt 164.4 lb

## 2019-06-18 DIAGNOSIS — Z6831 Body mass index (BMI) 31.0-31.9, adult: Secondary | ICD-10-CM | POA: Diagnosis not present

## 2019-06-18 DIAGNOSIS — R05 Cough: Secondary | ICD-10-CM

## 2019-06-18 DIAGNOSIS — R0982 Postnasal drip: Secondary | ICD-10-CM

## 2019-06-18 DIAGNOSIS — E6609 Other obesity due to excess calories: Secondary | ICD-10-CM | POA: Diagnosis not present

## 2019-06-18 DIAGNOSIS — E039 Hypothyroidism, unspecified: Secondary | ICD-10-CM

## 2019-06-18 DIAGNOSIS — R058 Other specified cough: Secondary | ICD-10-CM

## 2019-06-18 NOTE — Patient Instructions (Signed)

## 2019-06-18 NOTE — Progress Notes (Signed)
This visit occurred during the SARS-CoV-2 public health emergency.  Safety protocols were in place, including screening questions prior to the visit, additional usage of staff PPE, and extensive cleaning of exam room while observing appropriate contact time as indicated for disinfecting solutions.  Subjective:     Patient ID: Evelyn Orr , female    DOB: 12/07/1945 , 74 y.o.   MRN: [id number]   Chief Complaint  Patient presents with  . Cough    SOB when coughing    HPI  She is here today for evaluation of a cough. She has h/o chronic cough. She denies fever/chills. Recently had COVID test which was negative (about two weeks ago). Had sore throat and ear pain two weeks ago, but this has since resolved. Admits that she has not been taking Zyrtec. This has worked well for her in the past. She scheduled appt b/c she wants nebulizer treatment, this has worked well for her in the past.   Cough This is a recurrent problem. The current episode started 1 to 4 weeks ago. The problem has been unchanged. The problem occurs every few hours. Pertinent negatives include no chills, ear congestion or ear pain.     Past Medical History:  Diagnosis Date  . Arthritis   . Breast cancer ([place])   . Breast cancer of upper-outer quadrant of right female breast ([place]) 03/20/2015  . Cough   . Hypothyroidism   . Nasal congestion   . PONV (postoperative nausea and vomiting)   . Rheumatoid arthritis(714.0)   . Seasonal allergies   . Thyroid disease    hypothyroidism     Family History  Problem Relation Age of Onset  . Heart disease Father   . Heart attack Father   . Hypertension Mother   . Breast cancer Paternal Aunt        dx. 75 or younger  . Colon cancer Paternal Uncle   . Prostate cancer Maternal Uncle   . Other Sister        high breast density  . Prostate cancer Brother 44       unknown Gleason  . Prostate cancer Maternal Grandfather        dx. older age  . Colon polyps Son 50   colonoscopy for unspecified reason; found some colon polyps, has not had one since  . Prostate cancer Maternal Uncle   . Prostate cancer Maternal Uncle   . Colon cancer Other   . Cancer Other        MGM's sister  . Breast cancer Cousin        4 maternal cousin had breast cancer   . Rectal cancer Neg Hx   . Stomach cancer Neg Hx      Current Outpatient Medications:  .  anastrozole (ARIMIDEX) 1 MG tablet, TAKE 1 TABLET BY MOUTH EVERY DAY, Disp: 90 tablet, Rfl: 3 .  Azelastine HCl 0.15 % SOLN, 1 SPRAY EACH NOSTRIL DAILY AS NEEDED FOR NASAL CONGESTION, Disp: , Rfl: 1 .  benzonatate (TESSALON) 100 MG capsule, Take 1 capsule (100 mg total) by mouth 2 (two) times daily as needed for cough., Disp: 30 capsule, Rfl: 1 .  levothyroxine (SYNTHROID) 88 MCG tablet, TAKE 1 TABLET BY MOUTH EVERY DAY, Disp: 90 tablet, Rfl: 2 .  methotrexate (RHEUMATREX) 2.5 MG tablet, Take 2.5 mg by mouth once a week. 8 tablets weekly, Disp: , Rfl: 1 .  mometasone (NASONEX) 50 MCG/ACT nasal spray, PLACE 2 SPRAYS INTO THE NOSE DAILY., Disp:  17 g, Rfl: 2 .  Vitamin D, Ergocalciferol, (DRISDOL) 1.25 MG (50000 UT) CAPS capsule, TAKE 1 CAPSULE BY MOUTH 2 TIMES EVERY WEEK, Disp: 8 capsule, Rfl: 0 .  amoxicillin (AMOXIL) 875 MG tablet, Take 1 tablet (875 mg total) by mouth 2 (two) times daily. (Patient not taking: Reported on 06/18/2019), Disp: 10 tablet, Rfl: 0 .  clotrimazole-betamethasone (LOTRISONE) cream, clotrimazole-betamethasone 1 %-0.05 % topical cream  APPLY TO AFFECTED AND SURROUNDING AREAS OF SKIN 2 TIMES A DAY, MORNING AND EVENING FOR 2 WEEKS, Disp: , Rfl:    No Known Allergies   Review of Systems  Constitutional: Negative.  Negative for chills.  HENT: Negative for ear pain.   Respiratory: Positive for cough.   Cardiovascular: Negative.   Gastrointestinal: Negative.   Neurological: Negative.   Psychiatric/Behavioral: Negative.      Today's Vitals   06/18/19 1035  BP: 118/78  Pulse: 82  Temp: 98.6 F (37  C)  TempSrc: Oral  SpO2: 96%  Weight: 164 lb 6.4 oz (74.6 kg)  Height: 5\' 1"  (1.549 m)   Body mass index is 31.06 kg/m.   Objective:  Physical Exam Vitals and nursing note reviewed.  Constitutional:      Appearance: Normal appearance.  HENT:     Head: Normocephalic and atraumatic.     Nose:     Comments: Deferred, masked    Mouth/Throat:     Comments: Deferred, masked. Cardiovascular:     Rate and Rhythm: Normal rate and regular rhythm.     Heart sounds: Normal heart sounds.  Pulmonary:     Effort: Pulmonary effort is normal.     Comments: Few, scattered rhonchi. No wheezing.  Skin:    General: Skin is warm.  Neurological:     General: No focal deficit present.     Mental Status: She is alert.  Psychiatric:        Mood and Affect: Mood normal.        Behavior: Behavior normal.         Assessment And Plan:     1. Upper airway cough syndrome  Pt advised that nebulizer would not be effective b/c the reason for her cough is not stemming from her lungs. She was adamant that this has helped her in the past, she was given 1/2 dose albuterol nebulizer treatment.   Lungs were clear post treatment. Previous Pulmonary notes reviewed. She may benefit from gabapentin nightly should her sx persist. She will also take Tessalon perles as needed.   2. Postnasal drip  She is advised to start children's Zyrtec, 5mg  nightly.   3. Primary hypothyroidism  Her last labwork was performed Oct 2020; therefore, she is not due for bloodwork until April 2021. TSH was in therapeutic range, so I will recheck in April 2021.   4. Class 1 obesity due to excess calories with serious comorbidity and body mass index (BMI) of 31.0 to 31.9 in adult  She is encouraged to incorporate more exercise into her daily routine once she is over this acute episode of this cough syndrome. She is also encouraged to avoid sugary beverages and processed foods.   Maximino Greenland, MD   This exam room will not be  used for the remainder of the day.  THE PATIENT IS ENCOURAGED TO PRACTICE SOCIAL DISTANCING DUE TO THE COVID-19 PANDEMIC.

## 2019-06-18 NOTE — Telephone Encounter (Signed)
LVM wants to make sure she should get zyrtec from drug store? Per RS yes get children Zyrtec

## 2019-06-18 NOTE — Telephone Encounter (Signed)
Pt stated her Mometasone cost over $100 is there something else you can prescribe her? She said no rush

## 2019-06-19 ENCOUNTER — Telehealth: Payer: Self-pay

## 2019-06-19 NOTE — Telephone Encounter (Signed)
Per RS: pls call to check on pt - how is she feeling? any fever/chills?  Spoke w/pt she stated she felt fine, no fever, no chills and she appreciates the call

## 2019-06-25 DIAGNOSIS — M1712 Unilateral primary osteoarthritis, left knee: Secondary | ICD-10-CM | POA: Diagnosis not present

## 2019-07-02 DIAGNOSIS — M1712 Unilateral primary osteoarthritis, left knee: Secondary | ICD-10-CM | POA: Diagnosis not present

## 2019-08-01 ENCOUNTER — Ambulatory Visit (INDEPENDENT_AMBULATORY_CARE_PROVIDER_SITE_OTHER): Payer: Medicare Other | Admitting: Podiatry

## 2019-08-01 ENCOUNTER — Encounter: Payer: Self-pay | Admitting: Podiatry

## 2019-08-01 ENCOUNTER — Other Ambulatory Visit: Payer: Self-pay

## 2019-08-01 VITALS — Temp 96.6°F

## 2019-08-01 DIAGNOSIS — Q828 Other specified congenital malformations of skin: Secondary | ICD-10-CM

## 2019-08-01 DIAGNOSIS — M79672 Pain in left foot: Secondary | ICD-10-CM | POA: Diagnosis not present

## 2019-08-01 NOTE — Patient Instructions (Signed)

## 2019-08-06 NOTE — Progress Notes (Signed)
Subjective: Evelyn Orr presents today for follow up of follow up painful porokeratotic lesion submet head 5 left foot. She states she had a problem in December when her callus started bothering her. She saw Dr. Cannon Kettle and pain was relieved.   She is here for continued care of chronically painful porokeratotic lesion plantar aspect of left foot.  No Known Allergies   Objective: Vitals:   08/01/19 0913  Temp: (!) 96.6 F (35.9 C)    [name]  in NAD. AAO x 3.   Vascular Examination:  Capillary fill time to digits <3 seconds b/l. Palpable DP pulses b/l. Palpable PT pulses b/l. Pedal hair present b/l. Skin temperature gradient within normal limits b/l.  Dermatological Examination: Pedal skin with normal turgor, texture and tone bilaterally. No open wounds bilaterally. No interdigital macerations bilaterally. Porokeratotic lesion(s) submet head 5 left foot. No erythema, no edema, no drainage, no flocculence.  Musculoskeletal: Normal muscle strength 5/5 to all lower extremity muscle groups bilaterally, no pain crepitus or joint limitation noted with ROM b/l and plantarflexed 5th ray left foot  Neurological: Protective sensation intact 5/5 intact bilaterally with 10g monofilament b/l Vibratory sensation intact b/l  Assessment: 1. Porokeratosis   2. Left foot pain    Plan: -Painful porokeratotic lesion submet head 5 left foot pared and enucleated with sterile scalpel blade without incident. -Patient to continue soft, supportive shoe gear daily. -Patient to report any pedal injuries to medical professional immediately. -Patient/POA to call should there be question/concern in the interim.  Return in about 10 weeks (around 10/10/2019) for callus trim.

## 2019-08-08 DIAGNOSIS — M67912 Unspecified disorder of synovium and tendon, left shoulder: Secondary | ICD-10-CM | POA: Diagnosis not present

## 2019-08-08 DIAGNOSIS — M67911 Unspecified disorder of synovium and tendon, right shoulder: Secondary | ICD-10-CM | POA: Diagnosis not present

## 2019-08-17 ENCOUNTER — Encounter: Payer: Self-pay | Admitting: Internal Medicine

## 2019-09-04 ENCOUNTER — Encounter: Payer: Self-pay | Admitting: Internal Medicine

## 2019-09-04 ENCOUNTER — Ambulatory Visit (INDEPENDENT_AMBULATORY_CARE_PROVIDER_SITE_OTHER): Payer: Medicare Other | Admitting: Internal Medicine

## 2019-09-04 ENCOUNTER — Other Ambulatory Visit: Payer: Self-pay

## 2019-09-04 VITALS — BP 110/72 | HR 89 | Temp 98.9°F | Ht 61.2 in | Wt 160.6 lb

## 2019-09-04 DIAGNOSIS — R05 Cough: Secondary | ICD-10-CM

## 2019-09-04 DIAGNOSIS — R0789 Other chest pain: Secondary | ICD-10-CM

## 2019-09-04 DIAGNOSIS — R519 Headache, unspecified: Secondary | ICD-10-CM

## 2019-09-04 DIAGNOSIS — R059 Cough, unspecified: Secondary | ICD-10-CM

## 2019-09-04 DIAGNOSIS — M542 Cervicalgia: Secondary | ICD-10-CM

## 2019-09-04 MED ORDER — BENZONATATE 100 MG PO CAPS: 100.0000 mg | ORAL_CAPSULE | Freq: Two times a day (BID) | ORAL | 1 refills | Status: DC | PRN

## 2019-09-04 MED ORDER — ALBUTEROL SULFATE HFA 108 (90 BASE) MCG/ACT IN AERS: 2.0000 | INHALATION_SPRAY | Freq: Four times a day (QID) | RESPIRATORY_TRACT | 1 refills | Status: DC | PRN

## 2019-09-04 MED ORDER — MOMETASONE FUROATE 50 MCG/ACT NA SUSP: 2.0000 | Freq: Every day | NASAL | 2 refills | Status: DC

## 2019-09-04 MED ORDER — MONTELUKAST SODIUM 10 MG PO TABS: 10.0000 mg | ORAL_TABLET | Freq: Every day | ORAL | 2 refills | Status: DC

## 2019-09-04 NOTE — Progress Notes (Signed)
This visit occurred during the SARS-CoV-2 public health emergency.  Safety protocols were in place, including screening questions prior to the visit, additional usage of staff PPE, and extensive cleaning of exam room while observing appropriate contact time as indicated for disinfecting solutions.  Subjective:     Patient ID: Evelyn Orr , female    DOB: 28-Jan-1946 , 74 y.o.   MRN: [id number]   Chief Complaint  Patient presents with  . Cough    HPI  She is here today for evaluation of persistent cough. She reports she had "terrible" persistent cough over the weekend. It is non-productive. No associated fever/chills. She admits she has not been taking any allergy medication. She has been seen by Pulmonary for chronic cough in the past. She reports she coughed so much her chest became very sore.   Cough This is a recurrent problem. The current episode started in the past 7 days. Associated symptoms include headaches and myalgias.     Past Medical History:  Diagnosis Date  . Arthritis   . Breast cancer ([place])   . Breast cancer of upper-outer quadrant of right female breast ([place]) 03/20/2015  . Cough   . Hypothyroidism   . Nasal congestion   . PONV (postoperative nausea and vomiting)   . Rheumatoid arthritis(714.0)   . Seasonal allergies   . Thyroid disease    hypothyroidism     Family History  Problem Relation Age of Onset  . Heart disease Father   . Heart attack Father   . Hypertension Mother   . Breast cancer Paternal Aunt        dx. 37 or younger  . Colon cancer Paternal Uncle   . Prostate cancer Maternal Uncle   . Other Sister        high breast density  . Prostate cancer Brother 26       unknown Gleason  . Prostate cancer Maternal Grandfather        dx. older age  . Colon polyps Son 23       colonoscopy for unspecified reason; found some colon polyps, has not had one since  . Prostate cancer Maternal Uncle   . Prostate cancer Maternal Uncle   . Colon  cancer Other   . Cancer Other        MGM's sister  . Breast cancer Cousin        4 maternal cousin had breast cancer   . Rectal cancer Neg Hx   . Stomach cancer Neg Hx      Current Outpatient Medications:  .  anastrozole (ARIMIDEX) 1 MG tablet, TAKE 1 TABLET BY MOUTH EVERY DAY, Disp: 90 tablet, Rfl: 3 .  Azelastine HCl 0.15 % SOLN, 1 SPRAY EACH NOSTRIL DAILY AS NEEDED FOR NASAL CONGESTION, Disp: , Rfl: 1 .  clotrimazole-betamethasone (LOTRISONE) cream, clotrimazole-betamethasone 1 %-0.05 % topical cream  APPLY TO AFFECTED AND SURROUNDING AREAS OF SKIN 2 TIMES A DAY, MORNING AND EVENING FOR 2 WEEKS, Disp: , Rfl:  .  levothyroxine (SYNTHROID) 88 MCG tablet, TAKE 1 TABLET BY MOUTH EVERY DAY, Disp: 90 tablet, Rfl: 2 .  methotrexate (RHEUMATREX) 2.5 MG tablet, Take 2.5 mg by mouth once a week. 8 tablets weekly, Disp: , Rfl: 1 .  mometasone (NASONEX) 50 MCG/ACT nasal spray, Place 2 sprays into the nose daily., Disp: 17 g, Rfl: 2 .  Vitamin D, Ergocalciferol, (DRISDOL) 1.25 MG (50000 UT) CAPS capsule, TAKE 1 CAPSULE BY MOUTH 2 TIMES EVERY WEEK, Disp: 8 capsule,  Rfl: 0 .  albuterol (VENTOLIN HFA) 108 (90 Base) MCG/ACT inhaler, Inhale 2 puffs into the lungs every 6 (six) hours as needed for wheezing or shortness of breath., Disp: 18 g, Rfl: 1 .  benzonatate (TESSALON) 100 MG capsule, Take 1 capsule (100 mg total) by mouth 2 (two) times daily as needed for cough., Disp: 30 capsule, Rfl: 1 .  montelukast (SINGULAIR) 10 MG tablet, Take 1 tablet (10 mg total) by mouth daily., Disp: 30 tablet, Rfl: 2   No Known Allergies   Review of Systems  Constitutional: Negative.   Respiratory: Positive for cough.   Cardiovascular: Negative.   Gastrointestinal: Negative.   Musculoskeletal: Positive for myalgias and neck pain.  Neurological: Positive for headaches.       She c/o headache. Recurrent. Sx started about 2 weeks ago. No visual changes. Unable to determine what triggers her sx. States she also has neck  pain, that radiates upward. Denies UE weakness/paresthesias.   Psychiatric/Behavioral: Negative.      Today's Vitals   09/04/19 1408  BP: 110/72  Pulse: 89  Temp: 98.9 F (37.2 C)  Weight: 160 lb 9.6 oz (72.8 kg)  Height: 5' 1.2" (1.554 m)   Body mass index is 30.15 kg/m.   Objective:  Physical Exam Vitals and nursing note reviewed.  Constitutional:      Appearance: Normal appearance.  HENT:     Head: Normocephalic and atraumatic.  Cardiovascular:     Rate and Rhythm: Normal rate and regular rhythm.     Heart sounds: Normal heart sounds.  Pulmonary:     Effort: Pulmonary effort is normal.     Breath sounds: Normal breath sounds.  Chest:     Chest wall: Tenderness present.     Comments: She has reproducible chest pain with deep palpation of intercostal areas. No rash noted.  Musculoskeletal:     Comments: Trapezius muscles tender to palpation bilaterally.   Skin:    General: Skin is warm.  Neurological:     General: No focal deficit present.     Mental Status: She is alert.  Psychiatric:        Mood and Affect: Mood normal.        Behavior: Behavior normal.         Assessment And Plan:     1. Cough  Chronic, likely due to postnasal drip and allergic rhinitis. She was given rx montelukast, 10mg  daily. Also given refill of Nasonex NS.  2. New onset headache  Likely tension headache. She is advised to take magnesium nightly.   3. Cervicalgia  Advised to apply topical rub nightly to trapezius muscles/shoulders. She does agree to [facility] referral.   - Ambulatory referral to Chiropractic  4. Chest wall pain  Likely secondary to incessant coughing which has since improved. She was given rx tessalon perles to use prn to decrease frequency/severity of coughing spells. She will let me know if her sx persist.       Maximino Greenland, MD    THE PATIENT IS ENCOURAGED TO PRACTICE SOCIAL DISTANCING DUE TO THE COVID-19 PANDEMIC.

## 2019-09-17 DIAGNOSIS — M79643 Pain in unspecified hand: Secondary | ICD-10-CM | POA: Diagnosis not present

## 2019-09-17 DIAGNOSIS — M7989 Other specified soft tissue disorders: Secondary | ICD-10-CM | POA: Diagnosis not present

## 2019-09-17 DIAGNOSIS — M8580 Other specified disorders of bone density and structure, unspecified site: Secondary | ICD-10-CM | POA: Diagnosis not present

## 2019-09-17 DIAGNOSIS — M1990 Unspecified osteoarthritis, unspecified site: Secondary | ICD-10-CM | POA: Diagnosis not present

## 2019-09-17 DIAGNOSIS — M0589 Other rheumatoid arthritis with rheumatoid factor of multiple sites: Secondary | ICD-10-CM | POA: Diagnosis not present

## 2019-09-17 DIAGNOSIS — Z79899 Other long term (current) drug therapy: Secondary | ICD-10-CM | POA: Diagnosis not present

## 2019-09-18 ENCOUNTER — Ambulatory Visit: Payer: Medicare Other

## 2019-09-18 ENCOUNTER — Ambulatory Visit: Payer: BC Managed Care – PPO | Admitting: Internal Medicine

## 2019-10-08 DIAGNOSIS — Z79899 Other long term (current) drug therapy: Secondary | ICD-10-CM | POA: Diagnosis not present

## 2019-10-08 DIAGNOSIS — M0589 Other rheumatoid arthritis with rheumatoid factor of multiple sites: Secondary | ICD-10-CM | POA: Diagnosis not present

## 2019-10-26 ENCOUNTER — Ambulatory Visit (INDEPENDENT_AMBULATORY_CARE_PROVIDER_SITE_OTHER): Payer: Medicare Other | Admitting: Podiatry

## 2019-10-26 ENCOUNTER — Encounter: Payer: Self-pay | Admitting: Podiatry

## 2019-10-26 ENCOUNTER — Other Ambulatory Visit: Payer: Self-pay

## 2019-10-26 DIAGNOSIS — M79672 Pain in left foot: Secondary | ICD-10-CM | POA: Diagnosis not present

## 2019-10-26 DIAGNOSIS — Q828 Other specified congenital malformations of skin: Secondary | ICD-10-CM

## 2019-10-26 NOTE — Patient Instructions (Signed)

## 2019-10-26 NOTE — Progress Notes (Signed)
Subjective: Evelyn Orr is a 74 y.o. female patient seen today painful plantar lesions left foot.  Pain prevents comfortable ambulation and her daily activities such as walking for exercise. Aggravating factor is weightbearing with or without shoegear.  Patient Active Problem List   Diagnosis Date Noted  . Routine general medical examination at a health care facility 03/11/2018  . Upper airway cough syndrome 09/22/2017  . Osteopenia 12/16/2016  . Overweight (BMI 25.0-29.9) 06/23/2016  . S/P right TKA 06/21/2016  . Genetic testing 04/14/2015  . Family history of breast cancer 04/01/2015  . Breast cancer of upper-outer quadrant of right female breast ([place]) 03/20/2015  . Intraductal carcinoma in situ of breast 03/20/2015  . Flatfoot 10/11/2013  . Tendonitis, Achilles, right 07/31/2013  . Metatarsal deformity 07/31/2013  . Pain in lower limb 07/31/2013  . Rheumatoid arthritis ([place]) 03/17/2012  . Thyroid disease 03/17/2012    Current Outpatient Medications on File Prior to Visit  Medication Sig Dispense Refill  . albuterol (VENTOLIN HFA) 108 (90 Base) MCG/ACT inhaler Inhale 2 puffs into the lungs every 6 (six) hours as needed for wheezing or shortness of breath. 18 g 1  . anastrozole (ARIMIDEX) 1 MG tablet TAKE 1 TABLET BY MOUTH EVERY DAY 90 tablet 3  . aspirin 325 MG EC tablet aspirin 325 mg tablet,delayed release  TAKE 1 TABLET (325 MG TOTAL) BY MOUTH DAILY.    Marland Kitchen Azelastine HCl 0.15 % SOLN 1 SPRAY EACH NOSTRIL DAILY AS NEEDED FOR NASAL CONGESTION  1  . benzonatate (TESSALON) 100 MG capsule Take 1 capsule (100 mg total) by mouth 2 (two) times daily as needed for cough. 30 capsule 1  . Betamethasone Sodium Phosphate 6 MG/ML SOLN inject 2 CC'S OF Betamethasone sodium into left BUTTOCK    . clotrimazole-betamethasone (LOTRISONE) cream clotrimazole-betamethasone 1 %-0.05 % topical cream  APPLY TO AFFECTED AND SURROUNDING AREAS OF SKIN 2 TIMES A DAY, MORNING AND EVENING FOR 2 WEEKS     . diclofenac (FLECTOR) 1.3 % PTCH Flector 1.3 % transdermal 12 hour patch    . folic acid (FOLVITE) 1 MG tablet Take 1 mg by mouth daily.    Marland Kitchen levothyroxine (SYNTHROID) 88 MCG tablet TAKE 1 TABLET BY MOUTH EVERY DAY 90 tablet 2  . methotrexate (RHEUMATREX) 2.5 MG tablet Take 2.5 mg by mouth once a week. 8 tablets weekly  1  . mometasone (NASONEX) 50 MCG/ACT nasal spray Place 2 sprays into the nose daily. 17 g 2  . montelukast (SINGULAIR) 10 MG tablet Take 1 tablet (10 mg total) by mouth daily. 30 tablet 2  . nystatin ointment (MYCOSTATIN) APPLY TO THE AFFECTED AREA(S) BY TOPICAL ROUTE 2 TIMES PER DAY X 14 DAYS THEN AS NEEDED    . predniSONE (DELTASONE) 5 MG tablet     . sulfaSALAzine (AZULFIDINE) 500 MG tablet Take 1,000 mg by mouth 2 (two) times daily.    . Vitamin D, Ergocalciferol, (DRISDOL) 1.25 MG (50000 UT) CAPS capsule TAKE 1 CAPSULE BY MOUTH 2 TIMES EVERY WEEK 8 capsule 0   No current facility-administered medications on file prior to visit.    No Known Allergies  Objective: Physical Exam  General: Evelyn Orr is a pleasant 74 y.o.  African American female, WD, WN in NAD. AAO x 3.   Vascular:  Neurovascular status unchanged b/l. Capillary fill time to digits <3 seconds b/l. Palpable DP pulses b/l. Palpable PT pulses b/l. Pedal hair present b/l. Skin temperature gradient within normal limits b/l. No edema noted  b/l.  Dermatological:  Pedal skin with normal turgor, texture and tone bilaterally. No open wounds bilaterally. No interdigital macerations bilaterally. Porokeratotic lesion(s) submet head 5 left foot. No erythema, no edema, no drainage, no flocculence.  Musculoskeletal:  Normal muscle strength 5/5 to all lower extremity muscle groups bilaterally. No pain crepitus or joint limitation noted with ROM b/l. Plantarflexed 5th ray left foot. Patient ambulates independent of any assistive aids.  Neurological:  Protective sensation intact 5/5 intact bilaterally with 10g  monofilament b/l. Vibratory sensation intact b/l. Proprioception intact bilaterally. Clonus negative b/l.  Assessment and Plan:  1. Porokeratosis   2. Left foot pain    -Examined patient. -Painful porokeratotic lesion(s) submet head 5 left foot pared and enucleated with sterile scalpel blade without incident. -Patient to continue soft, supportive shoe gear daily. -Patient to report any pedal injuries to medical professional immediately. -Patient/POA to call should there be question/concern in the interim.  Return in about 10 weeks (around 01/04/2020) for callus trim.  Marzetta Board, DPM

## 2019-11-13 DIAGNOSIS — E0500 Thyrotoxicosis with diffuse goiter without thyrotoxic crisis or storm: Secondary | ICD-10-CM | POA: Diagnosis not present

## 2019-11-13 DIAGNOSIS — H0520 Unspecified exophthalmos: Secondary | ICD-10-CM | POA: Diagnosis not present

## 2019-11-13 DIAGNOSIS — H04123 Dry eye syndrome of bilateral lacrimal glands: Secondary | ICD-10-CM | POA: Diagnosis not present

## 2019-11-13 DIAGNOSIS — H40023 Open angle with borderline findings, high risk, bilateral: Secondary | ICD-10-CM | POA: Diagnosis not present

## 2019-11-15 DIAGNOSIS — M7989 Other specified soft tissue disorders: Secondary | ICD-10-CM | POA: Diagnosis not present

## 2019-11-15 DIAGNOSIS — M79643 Pain in unspecified hand: Secondary | ICD-10-CM | POA: Diagnosis not present

## 2019-11-15 DIAGNOSIS — M1990 Unspecified osteoarthritis, unspecified site: Secondary | ICD-10-CM | POA: Diagnosis not present

## 2019-11-15 DIAGNOSIS — Z79899 Other long term (current) drug therapy: Secondary | ICD-10-CM | POA: Diagnosis not present

## 2019-11-15 DIAGNOSIS — M0589 Other rheumatoid arthritis with rheumatoid factor of multiple sites: Secondary | ICD-10-CM | POA: Diagnosis not present

## 2019-11-15 DIAGNOSIS — M8580 Other specified disorders of bone density and structure, unspecified site: Secondary | ICD-10-CM | POA: Diagnosis not present

## 2019-12-05 NOTE — Progress Notes (Signed)
[place]   [phone] Fax:([phone]) [phone]   Clinic Follow up Note   Patient Care Team: [name], MD as PCP - General (Internal Medicine) Earnstine Regal, PA-C as Physician Assistant (Obstetrics and Gynecology) Rolm Bookbinder, MD as Consulting Physician (General Surgery) Truitt Merle, MD as Consulting Physician (Hematology) Thea Silversmith, MD as Consulting Physician (Radiation Oncology) Mauro Kaufmann, RN as Registered Nurse Rockwell Germany, RN as Registered Nurse Sylvan Cheese, NP as Nurse Practitioner (Hematology and Oncology)  Date of Service:  12/10/2019  CHIEF COMPLAINT: Follow up right breast DCIS  SUMMARY OF ONCOLOGIC HISTORY: Oncology History Overview Note  Breast cancer of upper-outer quadrant of right female breast [facility])   Staging form: Breast, AJCC 7th Edition     Clinical stage from 03/26/2015: Stage 0 (Tis (DCIS), N0, M0) - Unsigned     Breast cancer of upper-outer quadrant of right female breast ([place])  03/13/2015 Mammogram   7 mm oval mass in the right breast is suspicious for malignancy.   03/20/2015 Initial Biopsy   Breast, right, needle core biopsy DUCTAL CARCINOMA IN SITU IN PAPILLOMA, LOW GRADE   03/20/2015 Receptors her2   Estrogen Receptor: 95%, POSITIVE, STRONG STAINING INTENSITY Progesterone Receptor: 95%, POSITIVE, STRONG STAINING INTENSITY   03/20/2015 Clinical Stage   Stage 0: Tis N0   04/09/2015 Surgery   Right breast lumpectomy   04/09/2015 Pathology Results   Right breast lumpectomy showed low-grade DCIS, margins were negative. Fibrocystic changes with adenosis.   04/09/2015 Pathologic Stage   Stage 0: Tis N0   04/11/2015 Procedure   Breast/Ovarian panel (GeneDX) revealed variant of uncertain significance called "c.704C>A (p.Thr235Lys; otherwise negative at ATM, BARD1, BRCA1, BRCA2, BRIP1, CDH1, CHEK2, FANCC, MLH1, MSH2, MSH6, NBN, PALB2, PMS2, PTEN, RAD51C, RAD51D, TP53, and XRCC2.      06/20/2015 - 05/2020 Anti-estrogen oral therapy   anastrozole 1 mg once daily. Complete 5 year end of 05/2020   08/21/2015 Survivorship   Survivorship care plan completed and copy given to patient   03/23/2017 Mammogram   IMPRESSION: No evidence of malignancy      CURRENT THERAPY:  Anastrozole 1 mg once daily, started on 06/20/2015. Complete in late 05/2020.  INTERVAL HISTORY:  Evelyn Orr is here for a follow up of right breast DCIS. She was last seen by me 1 year ago. She presents to the clinic alone. She notes she is doing well. She denies any new major changes in the last year. She notes stable joint pain and Rheumatoid arthritis. She is on anastrozole. She notes she plans to have another colonoscopy in later 2021. She notes the colonoscopy on 08/09/19 that is in her chart is for [facility].    REVIEW OF SYSTEMS:   Constitutional: Denies fevers, chills or abnormal weight loss Eyes: Denies blurriness of vision Ears, nose, mouth, throat, and face: Denies mucositis or sore throat Respiratory: Denies cough, dyspnea or wheezes Cardiovascular: Denies palpitation, chest discomfort or lower extremity swelling Gastrointestinal:  Denies nausea, heartburn or change in bowel habits Skin: Denies abnormal skin rashes MSK: (+) Arthritis, RA stable  Lymphatics: Denies new lymphadenopathy or easy bruising Neurological:Denies numbness, tingling or new weaknesses Behavioral/Psych: Mood is stable, no new changes  All other systems were reviewed with the patient and are negative.  MEDICAL HISTORY:  Past Medical History:  Diagnosis Date  . Arthritis   . Breast cancer ([place])   . Breast cancer of upper-outer quadrant of right female breast ([place]) 03/20/2015  . Cough   .  Hypothyroidism   . Nasal congestion   . PONV (postoperative nausea and vomiting)   . Rheumatoid arthritis(714.0)   . Seasonal allergies   . Thyroid disease    hypothyroidism    SURGICAL HISTORY: Past Surgical  History:  Procedure Laterality Date  . BREAST LUMPECTOMY WITH RADIOACTIVE SEED LOCALIZATION Right 04/09/2015   Procedure: BREAST LUMPECTOMY WITH RADIOACTIVE SEED LOCALIZATION;  Surgeon: Emelia Loron, MD;  Location: [facility];  Service: General;  Laterality: Right;  . BREAST REDUCTION SURGERY  2001  . CALCANEAL OSTEOTOMY Left 10/11/2013   Procedure: LEFT CALCANEAL OSTEOTOMY;  Surgeon: Toni Arthurs, MD;  Location: [facility];  Service: Orthopedics;  Laterality: Left;  . CARPAL TUNNEL RELEASE  2006   rt  . CARPAL TUNNEL RELEASE     left  . CERVICAL FUSION  2009  . COLONOSCOPY     2002,2005,2008 w/[facility]  . GASTROCNEMIUS RECESSION Left 10/11/2013   Procedure: LEFT GASTROC RECESSION; LEFT POSTERIOR TIBIAL TENOLYSIS;  Surgeon: Toni Arthurs, MD;  Location: [facility];  Service: Orthopedics;  Laterality: Left;  . KNEE ARTHROSCOPY  2004   right  . POLYPECTOMY    . REMOVAL OF IMPLANT Left 02/14/2014   Procedure: LEFT FOOT REMOVAL OF DEEP IMPLANT;  Surgeon: Toni Arthurs, MD;  Location:  SURGERY CENTER;  Service: Orthopedics;  Laterality: Left;  . SHOULDER ARTHROSCOPY W/ ROTATOR CUFF REPAIR  2006   right  . THYROIDECTOMY     age 78  . TONSILLECTOMY    . TOTAL KNEE ARTHROPLASTY Right 06/21/2016   Procedure: RIGHT TOTAL KNEE ARTHROPLASTY;  Surgeon: Durene Romans, MD;  Location: [facility];  Service: Orthopedics;  Laterality: Right;  . TUBAL LIGATION    . WRIST ARTHRODESIS  2003   left     I have reviewed the social history and family history with the patient and they are unchanged from previous note.  ALLERGIES:  has No Known Allergies.  MEDICATIONS:  Current Outpatient Medications  Medication Sig Dispense Refill  . albuterol (VENTOLIN HFA) 108 (90 Base) MCG/ACT inhaler Inhale 2 puffs into the lungs every 6 (six) hours as needed for wheezing or shortness of breath. 18 g 1  . anastrozole (ARIMIDEX) 1 MG tablet Take 1 tablet (1 mg total) by  mouth daily. 90 tablet 1  . aspirin 325 MG EC tablet aspirin 325 mg tablet,delayed release  TAKE 1 TABLET (325 MG TOTAL) BY MOUTH DAILY.    Marland Kitchen Azelastine HCl 0.15 % SOLN 1 SPRAY EACH NOSTRIL DAILY AS NEEDED FOR NASAL CONGESTION  1  . benzonatate (TESSALON) 100 MG capsule Take 1 capsule (100 mg total) by mouth 2 (two) times daily as needed for cough. 30 capsule 1  . Betamethasone Sodium Phosphate 6 MG/ML SOLN inject 2 CC'S OF Betamethasone sodium into left BUTTOCK    . clotrimazole-betamethasone (LOTRISONE) cream clotrimazole-betamethasone 1 %-0.05 % topical cream  APPLY TO AFFECTED AND SURROUNDING AREAS OF SKIN 2 TIMES A DAY, MORNING AND EVENING FOR 2 WEEKS    . diclofenac (FLECTOR) 1.3 % PTCH Flector 1.3 % transdermal 12 hour patch    . folic acid (FOLVITE) 1 MG tablet Take 1 mg by mouth daily.    Marland Kitchen levothyroxine (SYNTHROID) 88 MCG tablet TAKE 1 TABLET BY MOUTH EVERY DAY 90 tablet 2  . methotrexate (RHEUMATREX) 2.5 MG tablet Take 2.5 mg by mouth once a week. 8 tablets weekly  1  . mometasone (NASONEX) 50 MCG/ACT nasal spray Place 2 sprays into the nose daily.  17 g 2  . montelukast (SINGULAIR) 10 MG tablet Take 1 tablet (10 mg total) by mouth daily. 30 tablet 2  . nystatin ointment (MYCOSTATIN) APPLY TO THE AFFECTED AREA(S) BY TOPICAL ROUTE 2 TIMES PER DAY X 14 DAYS THEN AS NEEDED    . predniSONE (DELTASONE) 5 MG tablet     . sulfaSALAzine (AZULFIDINE) 500 MG tablet Take 1,000 mg by mouth 2 (two) times daily.    . Vitamin D, Ergocalciferol, (DRISDOL) 1.25 MG (50000 UT) CAPS capsule TAKE 1 CAPSULE BY MOUTH 2 TIMES EVERY WEEK 8 capsule 0   No current facility-administered medications for this visit.    PHYSICAL EXAMINATION: ECOG PERFORMANCE STATUS: 0 - Asymptomatic  Vitals:   12/10/19 0836  BP: 135/82  Pulse: 95  Resp: 17  Temp: 97.7 F (36.5 C)  SpO2: 100%   Filed Weights   12/10/19 0836  Weight: 160 lb 12.8 oz (72.9 kg)    GENERAL:alert, no distress and comfortable SKIN: skin  color, texture, turgor are normal, no rashes or significant lesions EYES: normal, Conjunctiva are pink and non-injected, sclera clear  NECK: supple, thyroid normal size, non-tender, without nodularity LYMPH:  no palpable lymphadenopathy in the cervical, axillary  LUNGS: clear to auscultation and percussion with normal breathing effort HEART: regular rate & rhythm and no murmurs and no lower extremity edema ABDOMEN:abdomen soft, non-tender and normal bowel sounds Musculoskeletal:no cyanosis of digits and no clubbing  NEURO: alert & oriented x 3 with fluent speech, no focal motor/sensory deficits BREAST: (+) Right lumpectomy: Surgical incision healed well. (+) Lumpy breast tissue around nipple of right breast No palpable mass, nodules or adenopathy bilaterally. Breast exam benign.   LABORATORY DATA:  I have reviewed the data as listed CBC Latest Ref Rng & Units 12/10/2019 12/11/2018 03/13/2018  WBC 4.0 - 10.5 K/uL 6.4 7.4 6.2  Hemoglobin 12.0 - 15.0 g/dL 12.4 13.0 12.5  Hematocrit 36 - 46 % 38.5 39.9 37.9  Platelets 150 - 400 K/uL 274 351 371     CMP Latest Ref Rng & Units 12/10/2019 12/11/2018 03/13/2018  Glucose 70 - 99 mg/dL 114(H) 95 71  BUN 8 - 23 mg/dL 9 8 7(L)  Creatinine 0.44 - 1.00 mg/dL 0.78 0.77 0.73  Sodium 135 - 145 mmol/L 142 142 142  Potassium 3.5 - 5.1 mmol/L 3.2(L) 3.7 3.8  Chloride 98 - 111 mmol/L 106 104 100  CO2 22 - 32 mmol/L _0 Calcium 8.9 - 10.3 mg/dL 9.7 9.6 9.6  Total Protein 6.5 - 8.1 g/dL 8.0 8.3(H) 7.9  Total Bilirubin 0.3 - 1.2 mg/dL 0.5 0.3 0.4  Alkaline Phos 38 - 126 U/L 109 103 110  AST 15 - 41 U/L _1 ALT 0 - 44 U/L _2 RADIOGRAPHIC STUDIES: I have personally reviewed the radiological images as listed and agreed with the findings in the report. No results found.   ASSESSMENT & PLAN:  Evelyn Orr is a 74 y.o. female with   1. Right breast DCIS, low grade, ER and PR strongly positive -She was diagnosed in 03/2015.  She is s/p right lumpectomy with complete resection. Any form of adjuvant therapy is preventive. -adjuvant irradiation was not recommended. -She iscurrently on anastrozole since 1/2017to prevent future breast cancer. Tolerating well with manageable hot flashes, we'll continue,plan for a total of 5 years -She is clinically doing well. Lab reviewed, her CBC and CMP are within normal limits except K 3.2. Her  physical exam and her 04/2019 mammogram were unremarkable. There is no clinical concern for recurrence. -Continue surveillance. Next Mammogram in 03/2020 -Continue Anastrozole until 05/2020  -F/u in 1 year as she is regularly seen by her other physicians.  -I recommend she proceed with repeat colonoscopy this year. She is agreeable. I will refer her back to GI.    2. Osteopenia -Her 05/2015 DEXA showed osteopenia. Her DEXA from 05/30/17 and 05/2018 showed stable in AP spine (T-score -2.4), but decrease in b/l hips, she remains osteopenic. -Given she has been on prednisone, this can also weaken her bone. Based on her 2022 DEXA I may recommend bisphosphonate to strengthen her bone. She can also discuss this with her Rheumatologist.  -I encouraged her to take VitD and calcium supplement as well and to remain active with walking often.   3. Hyperthyroidism and rheumatoid arthritis -She will continue follow-up with her primary care physician. -She is on weekly methotrexate, and her rheumatoid arthritis has been well-controlled. -stable.   Plan -She is clinically doing well  -continue Anastrozole until end of 05/2020, I refilled 6 months supply for her today  -Mammogram in 03/2020 at [facility] and F/u with NP Regan Rakers  in1 year -Send GI referral for colonoscopy    No problem-specific Assessment & Plan notes found for this encounter.   Orders Placed This Encounter  Procedures  . MM DIAG BREAST TOMO BILATERAL    Standing Status:   Future    Standing Expiration Date:   12/09/2020     Scheduling Instructions:     [facility]    Order Specific Question:   Reason for Exam (SYMPTOM  OR DIAGNOSIS REQUIRED)    Answer:   screening    Order Specific Question:   Preferred imaging location?    Answer:   External  . Ambulatory referral to Gastroenterology    Referral Priority:   Routine    Referral Type:   Consultation    Referral Reason:   Specialty Services Required    Number of Visits Requested:   1   All questions were answered. The patient knows to call the clinic with any problems, questions or concerns. No barriers to learning was detected.      Truitt Merle, MD 12/10/2019   I, Joslyn Devon, am acting as scribe for Truitt Merle, MD.   I have reviewed the above documentation for accuracy and completeness, and I agree with the above.

## 2019-12-10 ENCOUNTER — Other Ambulatory Visit: Payer: 59

## 2019-12-10 ENCOUNTER — Inpatient Hospital Stay: Payer: Medicare Other | Attending: Hematology | Admitting: Hematology

## 2019-12-10 ENCOUNTER — Ambulatory Visit: Payer: 59 | Admitting: Hematology

## 2019-12-10 ENCOUNTER — Telehealth: Payer: Self-pay | Admitting: Nurse Practitioner

## 2019-12-10 ENCOUNTER — Inpatient Hospital Stay (HOSPITAL_BASED_OUTPATIENT_CLINIC_OR_DEPARTMENT_OTHER): Payer: Medicare Other | Admitting: Hematology

## 2019-12-10 ENCOUNTER — Telehealth: Payer: Self-pay

## 2019-12-10 ENCOUNTER — Other Ambulatory Visit: Payer: Self-pay

## 2019-12-10 ENCOUNTER — Encounter: Payer: Self-pay | Admitting: Hematology

## 2019-12-10 VITALS — BP 135/82 | HR 95 | Temp 97.7°F | Resp 17 | Ht 61.2 in | Wt 160.8 lb

## 2019-12-10 DIAGNOSIS — Z7952 Long term (current) use of systemic steroids: Secondary | ICD-10-CM | POA: Diagnosis not present

## 2019-12-10 DIAGNOSIS — C50411 Malignant neoplasm of upper-outer quadrant of right female breast: Secondary | ICD-10-CM

## 2019-12-10 DIAGNOSIS — Z79899 Other long term (current) drug therapy: Secondary | ICD-10-CM | POA: Diagnosis not present

## 2019-12-10 DIAGNOSIS — E0590 Thyrotoxicosis, unspecified without thyrotoxic crisis or storm: Secondary | ICD-10-CM | POA: Insufficient documentation

## 2019-12-10 DIAGNOSIS — M069 Rheumatoid arthritis, unspecified: Secondary | ICD-10-CM | POA: Insufficient documentation

## 2019-12-10 DIAGNOSIS — R232 Flushing: Secondary | ICD-10-CM | POA: Diagnosis not present

## 2019-12-10 DIAGNOSIS — Z170 Estrogen receptor positive status [ER+]: Secondary | ICD-10-CM

## 2019-12-10 DIAGNOSIS — M8589 Other specified disorders of bone density and structure, multiple sites: Secondary | ICD-10-CM | POA: Insufficient documentation

## 2019-12-10 DIAGNOSIS — Z1211 Encounter for screening for malignant neoplasm of colon: Secondary | ICD-10-CM | POA: Diagnosis not present

## 2019-12-10 DIAGNOSIS — Z79811 Long term (current) use of aromatase inhibitors: Secondary | ICD-10-CM | POA: Insufficient documentation

## 2019-12-10 DIAGNOSIS — M2550 Pain in unspecified joint: Secondary | ICD-10-CM | POA: Diagnosis not present

## 2019-12-10 LAB — COMPREHENSIVE METABOLIC PANEL
ALT: 12 U/L (ref 0–44)
AST: 19 U/L (ref 15–41)
Albumin: 3.6 g/dL (ref 3.5–5.0)
Alkaline Phosphatase: 109 U/L (ref 38–126)
Anion gap: 11 (ref 5–15)
BUN: 9 mg/dL (ref 8–23)
CO2: 25 mmol/L (ref 22–32)
Calcium: 9.7 mg/dL (ref 8.9–10.3)
Chloride: 106 mmol/L (ref 98–111)
Creatinine, Ser: 0.78 mg/dL (ref 0.44–1.00)
GFR calc Af Amer: >60 mL/min (ref >60)
GFR calc non Af Amer: >60 mL/min (ref >60)
Glucose, Bld: 114 mg/dL — ABNORMAL HIGH (ref 70–99)
Potassium: 3.2 mmol/L — ABNORMAL LOW (ref 3.5–5.1)
Sodium: 142 mmol/L (ref 135–145)
Total Bilirubin: 0.5 mg/dL (ref 0.3–1.2)
Total Protein: 8 g/dL (ref 6.5–8.1)

## 2019-12-10 LAB — CBC WITH DIFFERENTIAL/PLATELET
Abs Immature Granulocytes: 0.01 10*3/uL (ref 0.00–0.07)
Basophils Absolute: 0 10*3/uL (ref 0.0–0.1)
Basophils Relative: 1 %
Eosinophils Absolute: 0.1 10*3/uL (ref 0.0–0.5)
Eosinophils Relative: 1 %
HCT: 38.5 % (ref 36.0–46.0)
Hemoglobin: 12.4 g/dL (ref 12.0–15.0)
Immature Granulocytes: 0 %
Lymphocytes Relative: 45 %
Lymphs Abs: 2.9 10*3/uL (ref 0.7–4.0)
MCH: 30.2 pg (ref 26.0–34.0)
MCHC: 32.2 g/dL (ref 30.0–36.0)
MCV: 93.9 fL (ref 80.0–100.0)
Monocytes Absolute: 0.2 10*3/uL (ref 0.1–1.0)
Monocytes Relative: 3 %
Neutro Abs: 3.3 10*3/uL (ref 1.7–7.7)
Neutrophils Relative %: 50 %
Platelets: 274 10*3/uL (ref 150–400)
RBC: 4.1 MIL/uL (ref 3.87–5.11)
RDW: 13.6 % (ref 11.5–15.5)
WBC: 6.4 10*3/uL (ref 4.0–10.5)
nRBC: 0 % (ref 0.0–0.2)

## 2019-12-10 MED ORDER — ANASTROZOLE 1 MG PO TABS: 1.0000 mg | ORAL_TABLET | Freq: Every day | ORAL | 1 refills | Status: DC

## 2019-12-10 NOTE — Progress Notes (Signed)
Called left message to return call for lab follow up

## 2019-12-10 NOTE — Telephone Encounter (Signed)
Message left for pt to please return call to review lab results

## 2019-12-10 NOTE — Telephone Encounter (Signed)
Scheduled per 07/12 los, patient received updated calender.

## 2019-12-10 NOTE — Telephone Encounter (Signed)
-----   Message from Truitt Merle, MD sent at 12/10/2019 12:46 PM EDT ----- Please let pt know her K was low this morning, I will call in KCL 6meq daily for 7 days, and encourage her to have high K diet, thanks   Truitt Merle

## 2019-12-11 ENCOUNTER — Telehealth: Payer: Self-pay

## 2019-12-11 ENCOUNTER — Other Ambulatory Visit: Payer: Self-pay

## 2019-12-11 MED ORDER — POTASSIUM CHLORIDE CRYS ER 20 MEQ PO TBCR: 20.0000 meq | EXTENDED_RELEASE_TABLET | Freq: Every day | ORAL | 0 refills | Status: DC

## 2019-12-11 NOTE — Telephone Encounter (Signed)
-----   Message from Truitt Merle, MD sent at 12/10/2019 12:46 PM EDT ----- Please let pt know her K was low this morning, I will call in KCL 73meq daily for 7 days, and encourage her to have high K diet, thanks   Truitt Merle

## 2019-12-11 NOTE — Telephone Encounter (Signed)
Called patient and made her aware of Dr. Ernestina Penna message regarding potassium result. Patient verbalized understanding and is aware a prescription for potassium has been sent to her pharmacy.

## 2019-12-12 ENCOUNTER — Encounter: Payer: Self-pay | Admitting: Internal Medicine

## 2020-01-07 ENCOUNTER — Other Ambulatory Visit: Payer: Self-pay

## 2020-01-07 ENCOUNTER — Ambulatory Visit (INDEPENDENT_AMBULATORY_CARE_PROVIDER_SITE_OTHER): Payer: BC Managed Care – PPO

## 2020-01-07 ENCOUNTER — Ambulatory Visit (INDEPENDENT_AMBULATORY_CARE_PROVIDER_SITE_OTHER): Payer: BC Managed Care – PPO | Admitting: Podiatry

## 2020-01-07 ENCOUNTER — Encounter: Payer: Self-pay | Admitting: Podiatry

## 2020-01-07 DIAGNOSIS — G5762 Lesion of plantar nerve, left lower limb: Secondary | ICD-10-CM | POA: Diagnosis not present

## 2020-01-07 DIAGNOSIS — M216X2 Other acquired deformities of left foot: Secondary | ICD-10-CM | POA: Diagnosis not present

## 2020-01-07 DIAGNOSIS — M79672 Pain in left foot: Secondary | ICD-10-CM | POA: Diagnosis not present

## 2020-01-07 DIAGNOSIS — L84 Corns and callosities: Secondary | ICD-10-CM

## 2020-01-08 NOTE — Progress Notes (Signed)
Subjective: Evelyn Orr is a 74 y.o. female patient seen today painful plantar lesions left foot.  Pain prevents comfortable ambulation and her daily activities such as walking for exercise. Aggravating factor is weightbearing with or without shoegear.  Patient c/o painful left foot which has been present for the past few weeks. Discomfort noted between left 2nd and 3rd metatarsals towards the end of the day. She denies any attempt at treatment.  Patient Active Problem List   Diagnosis Date Noted  . Routine general medical examination at a health care facility 03/11/2018  . Upper airway cough syndrome 09/22/2017  . Osteopenia 12/16/2016  . Overweight (BMI 25.0-29.9) 06/23/2016  . S/P right TKA 06/21/2016  . Genetic testing 04/14/2015  . Family history of breast cancer 04/01/2015  . Breast cancer of upper-outer quadrant of right female breast ([place]) 03/20/2015  . Intraductal carcinoma in situ of breast 03/20/2015  . Flatfoot 10/11/2013  . Tendonitis, Achilles, right 07/31/2013  . Metatarsal deformity 07/31/2013  . Pain in lower limb 07/31/2013  . Rheumatoid arthritis ([place]) 03/17/2012  . Thyroid disease 03/17/2012    Current Outpatient Medications on File Prior to Visit  Medication Sig Dispense Refill  . albuterol (VENTOLIN HFA) 108 (90 Base) MCG/ACT inhaler Inhale 2 puffs into the lungs every 6 (six) hours as needed for wheezing or shortness of breath. 18 g 1  . anastrozole (ARIMIDEX) 1 MG tablet Take 1 tablet (1 mg total) by mouth daily. 90 tablet 1  . aspirin 325 MG EC tablet aspirin 325 mg tablet,delayed release  TAKE 1 TABLET (325 MG TOTAL) BY MOUTH DAILY.    Marland Kitchen Azelastine HCl 0.15 % SOLN 1 SPRAY EACH NOSTRIL DAILY AS NEEDED FOR NASAL CONGESTION  1  . benzonatate (TESSALON) 100 MG capsule Take 1 capsule (100 mg total) by mouth 2 (two) times daily as needed for cough. 30 capsule 1  . Betamethasone Sodium Phosphate 6 MG/ML SOLN inject 2 CC'S OF Betamethasone sodium into left  BUTTOCK    . clotrimazole-betamethasone (LOTRISONE) cream clotrimazole-betamethasone 1 %-0.05 % topical cream  APPLY TO AFFECTED AND SURROUNDING AREAS OF SKIN 2 TIMES A DAY, MORNING AND EVENING FOR 2 WEEKS    . diclofenac (FLECTOR) 1.3 % PTCH Flector 1.3 % transdermal 12 hour patch    . folic acid (FOLVITE) 1 MG tablet Take 1 mg by mouth daily.    Marland Kitchen levothyroxine (SYNTHROID) 88 MCG tablet TAKE 1 TABLET BY MOUTH EVERY DAY 90 tablet 2  . methotrexate (RHEUMATREX) 2.5 MG tablet Take 2.5 mg by mouth once a week. 8 tablets weekly  1  . mometasone (NASONEX) 50 MCG/ACT nasal spray Place 2 sprays into the nose daily. 17 g 2  . montelukast (SINGULAIR) 10 MG tablet Take 1 tablet (10 mg total) by mouth daily. 30 tablet 2  . nystatin ointment (MYCOSTATIN) APPLY TO THE AFFECTED AREA(S) BY TOPICAL ROUTE 2 TIMES PER DAY X 14 DAYS THEN AS NEEDED    . potassium chloride SA (KLOR-CON) 20 MEQ tablet Take 1 tablet (20 mEq total) by mouth daily for 7 days. 7 tablet 0  . predniSONE (DELTASONE) 5 MG tablet     . sulfaSALAzine (AZULFIDINE) 500 MG tablet Take 1,000 mg by mouth 2 (two) times daily.    . Vitamin D, Ergocalciferol, (DRISDOL) 1.25 MG (50000 UT) CAPS capsule TAKE 1 CAPSULE BY MOUTH 2 TIMES EVERY WEEK 8 capsule 0   No current facility-administered medications on file prior to visit.    No Known Allergies  Objective:  Physical Exam  General: Evelyn Orr is a pleasant 74 y.o.  African American female, WD, WN in NAD. AAO x 3.   Vascular:  Neurovascular status unchanged b/l. Capillary fill time to digits <3 seconds b/l. Palpable DP pulses b/l. Palpable PT pulses b/l. Pedal hair present b/l. Skin temperature gradient within normal limits b/l. No edema noted b/l.  Dermatological:  Pedal skin with normal turgor, texture and tone bilaterally. No open wounds bilaterally. No interdigital macerations bilaterally. Porokeratotic lesion(s) submet head 5 left foot. No erythema, no edema, no drainage, no  flocculence.  Musculoskeletal:  Normal muscle strength 5/5 to all lower extremity muscle groups bilaterally. No pain crepitus or joint limitation noted with ROM b/l. Plantarflexed 5th ray left foot. Patient ambulates independent of any assistive aids.  There is pain on palpation to webspace between left 2nd and 3rd metatarsals.   Neurological:  Protective sensation intact 5/5 intact bilaterally with 10g monofilament b/l. Vibratory sensation intact b/l. Proprioception intact bilaterally. Clonus negative b/l.   Xray left foot, 3 views: Normal mineralization. +posterior calcaneal spurring +pes planus No evidence of fracture. Narrowing of space between metatarsal heads 2, 3 left foot consistent with radiological findings of neuroma. No evidence of bone erosion.  Assessment and Plan:  1. Callus   2. Prominent metatarsal head of left foot   3. Plantar neuroma of left foot   4. Left foot pain    -Examined patient. -Xray left foot 3 views, taken and reviewed in office with [name]. -Discussed neuroma and treatment options including padding, dehydrated alcohol injections and surgical removal. Patient opted for padding on today's visit. Metatarsal pad applied today. We will revisit progress on next visit. -Painful porokeratotic lesion(s) submet head 5 left foot pared and enucleated with sterile scalpel blade without incident. -Patient to continue soft, supportive shoe gear daily. -Patient to report any pedal injuries to medical professional immediately. -Patient/POA to call should there be question/concern in the interim.  Return in about 9 weeks (around 03/10/2020) for callus trim.  Marzetta Board, DPM

## 2020-02-08 ENCOUNTER — Other Ambulatory Visit: Payer: Self-pay | Admitting: Internal Medicine

## 2020-02-14 DIAGNOSIS — M8580 Other specified disorders of bone density and structure, unspecified site: Secondary | ICD-10-CM | POA: Diagnosis not present

## 2020-02-14 DIAGNOSIS — M1990 Unspecified osteoarthritis, unspecified site: Secondary | ICD-10-CM | POA: Diagnosis not present

## 2020-02-14 DIAGNOSIS — M0589 Other rheumatoid arthritis with rheumatoid factor of multiple sites: Secondary | ICD-10-CM | POA: Diagnosis not present

## 2020-02-14 DIAGNOSIS — M79643 Pain in unspecified hand: Secondary | ICD-10-CM | POA: Diagnosis not present

## 2020-02-14 DIAGNOSIS — Z79899 Other long term (current) drug therapy: Secondary | ICD-10-CM | POA: Diagnosis not present

## 2020-03-06 DIAGNOSIS — Z20828 Contact with and (suspected) exposure to other viral communicable diseases: Secondary | ICD-10-CM | POA: Diagnosis not present

## 2020-03-11 DIAGNOSIS — Z23 Encounter for immunization: Secondary | ICD-10-CM | POA: Diagnosis not present

## 2020-03-18 ENCOUNTER — Ambulatory Visit (INDEPENDENT_AMBULATORY_CARE_PROVIDER_SITE_OTHER): Payer: Medicare Other | Admitting: Podiatry

## 2020-03-18 ENCOUNTER — Other Ambulatory Visit: Payer: Self-pay

## 2020-03-18 ENCOUNTER — Encounter: Payer: Self-pay | Admitting: Podiatry

## 2020-03-18 DIAGNOSIS — G5762 Lesion of plantar nerve, left lower limb: Secondary | ICD-10-CM

## 2020-03-18 DIAGNOSIS — L84 Corns and callosities: Secondary | ICD-10-CM

## 2020-03-18 DIAGNOSIS — M7742 Metatarsalgia, left foot: Secondary | ICD-10-CM | POA: Diagnosis not present

## 2020-03-18 DIAGNOSIS — M79672 Pain in left foot: Secondary | ICD-10-CM | POA: Diagnosis not present

## 2020-03-18 NOTE — Progress Notes (Signed)
Subjective: Evelyn Orr is a 74 y.o. female patient seen today painful plantar lesion left foot.  Pain prevents comfortable ambulation and her daily activities such as walking for exercise. Aggravating factor is weightbearing with or without shoegear. Symptoms resolve with periodic professional debridement.  Patient c/o painful left foot which has been present since last visit. Discomfort noted between left 2nd and 3rd metatarsals towards the end of the day. She denies any attempt at treatment. She had xrays for this last visit. Pain has gotten progressively worse and she states that pain level is 8/10.   Patient Active Problem List   Diagnosis Date Noted  . Routine general medical examination at a health care facility 03/11/2018  . Upper airway cough syndrome 09/22/2017  . Osteopenia 12/16/2016  . Overweight (BMI 25.0-29.9) 06/23/2016  . S/P right TKA 06/21/2016  . Genetic testing 04/14/2015  . Family history of breast cancer 04/01/2015  . Breast cancer of upper-outer quadrant of right female breast ([facility]) 03/20/2015  . Intraductal carcinoma in situ of breast 03/20/2015  . Flatfoot 10/11/2013  . Tendonitis, Achilles, right 07/31/2013  . Metatarsal deformity 07/31/2013  . Pain in lower limb 07/31/2013  . Rheumatoid arthritis ([facility]) 03/17/2012  . Thyroid disease 03/17/2012    Current Outpatient Medications on File Prior to Visit  Medication Sig Dispense Refill  . albuterol (VENTOLIN HFA) 108 (90 Base) MCG/ACT inhaler Inhale 2 puffs into the lungs every 6 (six) hours as needed for wheezing or shortness of breath. 18 g 1  . anastrozole (ARIMIDEX) 1 MG tablet Take 1 tablet (1 mg total) by mouth daily. 90 tablet 1  . aspirin 325 MG EC tablet aspirin 325 mg tablet,delayed release  TAKE 1 TABLET (325 MG TOTAL) BY MOUTH DAILY.    . Azelastine HCl 0.15 % SOLN 1 SPRAY EACH NOSTRIL DAILY AS NEEDED FOR NASAL CONGESTION  1  . benzonatate (TESSALON) 100 MG capsule Take 1 capsule (100 mg total)  by mouth 2 (two) times daily as needed for cough. 30 capsule 1  . Betamethasone Sodium Phosphate 6 MG/ML SOLN inject 2 CC'S OF Betamethasone sodium into left BUTTOCK    . clotrimazole-betamethasone (LOTRISONE) cream clotrimazole-betamethasone 1 %-0.05 % topical cream  APPLY TO AFFECTED AND SURROUNDING AREAS OF SKIN 2 TIMES A DAY, MORNING AND EVENING FOR 2 WEEKS    . COVID-19 Specimen Collection KIT See admin instructions. for testing    . diclofenac (FLECTOR) 1.3 % PTCH Flector 1.3 % transdermal 12 hour patch    . folic acid (FOLVITE) 1 MG tablet Take 1 mg by mouth daily.    . levothyroxine (SYNTHROID) 88 MCG tablet TAKE 1 TABLET BY MOUTH EVERY DAY 90 tablet 2  . methotrexate (RHEUMATREX) 2.5 MG tablet Take 2.5 mg by mouth once a week. 8 tablets weekly  1  . mometasone (NASONEX) 50 MCG/ACT nasal spray Place 2 sprays into the nose daily. 17 g 2  . montelukast (SINGULAIR) 10 MG tablet Take 1 tablet (10 mg total) by mouth daily. 30 tablet 2  . nystatin ointment (MYCOSTATIN) APPLY TO THE AFFECTED AREA(S) BY TOPICAL ROUTE 2 TIMES PER DAY X 14 DAYS THEN AS NEEDED    . potassium chloride SA (KLOR-CON) 20 MEQ tablet Take 1 tablet (20 mEq total) by mouth daily for 7 days. 7 tablet 0  . predniSONE (DELTASONE) 5 MG tablet     . sulfaSALAzine (AZULFIDINE) 500 MG tablet Take 1,000 mg by mouth 2 (two) times daily.    . Vitamin D, Ergocalciferol, (  DRISDOL) 1.25 MG (50000 UT) CAPS capsule TAKE 1 CAPSULE BY MOUTH 2 TIMES EVERY WEEK 8 capsule 0   No current facility-administered medications on file prior to visit.    No Known Allergies  Objective: Physical Exam  General: Evelyn Orr is a pleasant 74 y.o.  African American female, WD, WN in NAD. AAO x 3.   Vascular:  Neurovascular status unchanged b/l. Capillary fill time to digits <3 seconds b/l. Palpable DP pulses b/l. Palpable PT pulses b/l. Pedal hair present b/l. Skin temperature gradient within normal limits b/l. No edema noted  b/l.  Dermatological:  Pedal skin with normal turgor, texture and tone bilaterally. No open wounds bilaterally. No interdigital macerations bilaterally. Porokeratotic lesion(s) submet head 5 left foot. No erythema, no edema, no drainage, no flocculence.  Musculoskeletal:  Normal muscle strength 5/5 to all lower extremity muscle groups bilaterally. No pain crepitus or joint limitation noted with ROM b/l. Plantarflexed 5th ray left foot with pain on palpation. Patient ambulates independent of any assistive aids.  There is pain on palpation to webspace between left 2nd and 3rd metatarsals.   Neurological:  Protective sensation intact 5/5 intact bilaterally with 10g monofilament b/l. Vibratory sensation intact b/l. Proprioception intact bilaterally. Clonus negative b/l.   Xray left foot from prior visit, 3 views: Normal mineralization. +posterior calcaneal spurring +pes planus No evidence of fracture. Narrowing of space between metatarsal heads 2, 3 left foot consistent with radiological findings of neuroma. No evidence of bone erosion.  Assessment and Plan:  1. Callus   2. Plantar neuroma of left foot   3. Pain in left foot   4. Metatarsalgia of left foot    -Examined patient. -Again, we discussed neuroma and treatment options including padding, dehydrated alcohol injections and surgical removal. Patient opted for padding on today's visit. Metatarsal pad applied today. We will revisit progress on next visit. She opted for dehydrated alcohol injection left 2nd webspace. Area prepped with betadine and 1 1/3 cc of dehydrated alcohol mixture administered to left 2nd webspace. Band-aid applied. -Will have Orthotics office check benefits for custom orthotics with added neuroma padding and offloading for plantar lesion of left foot. Office will call her with results of insurance benefits. -Painful porokeratotic lesion(s) submet head 5 left foot pared and enucleated with sterile scalpel blade  without incident. -Patient to continue soft, supportive shoe gear daily. -Patient to report any pedal injuries to medical professional immediately. -Patient/POA to call should there be question/concern in the interim.  Return in about 3 weeks (around 04/08/2020).  Marzetta Board, DPM

## 2020-04-01 ENCOUNTER — Encounter: Payer: BC Managed Care – PPO | Admitting: Internal Medicine

## 2020-04-07 DIAGNOSIS — Z853 Personal history of malignant neoplasm of breast: Secondary | ICD-10-CM | POA: Diagnosis not present

## 2020-04-07 LAB — HM MAMMOGRAPHY

## 2020-04-08 ENCOUNTER — Encounter: Payer: Self-pay | Admitting: Internal Medicine

## 2020-04-08 ENCOUNTER — Ambulatory Visit (INDEPENDENT_AMBULATORY_CARE_PROVIDER_SITE_OTHER): Payer: Medicare Other | Admitting: Podiatry

## 2020-04-08 ENCOUNTER — Encounter: Payer: Self-pay | Admitting: Podiatry

## 2020-04-08 ENCOUNTER — Other Ambulatory Visit: Payer: Self-pay

## 2020-04-08 DIAGNOSIS — M79672 Pain in left foot: Secondary | ICD-10-CM | POA: Diagnosis not present

## 2020-04-08 DIAGNOSIS — G5762 Lesion of plantar nerve, left lower limb: Secondary | ICD-10-CM | POA: Diagnosis not present

## 2020-04-08 NOTE — Progress Notes (Addendum)
Subjective:  Evelyn Orr presents to clinic today for follow up of left 2nd webspace neuroma. Patient states pain scale is better, describing it as a 5/10. She states last visit was 9/10.   Current Outpatient Medications on File Prior to Visit  Medication Sig Dispense Refill  . albuterol (VENTOLIN HFA) 108 (90 Base) MCG/ACT inhaler Inhale 2 puffs into the lungs every 6 (six) hours as needed for wheezing or shortness of breath. 18 g 1  . anastrozole (ARIMIDEX) 1 MG tablet Take 1 tablet (1 mg total) by mouth daily. 90 tablet 1  . aspirin 325 MG EC tablet aspirin 325 mg tablet,delayed release  TAKE 1 TABLET (325 MG TOTAL) BY MOUTH DAILY.    Marland Kitchen Azelastine HCl 0.15 % SOLN 1 SPRAY EACH NOSTRIL DAILY AS NEEDED FOR NASAL CONGESTION  1  . benzonatate (TESSALON) 100 MG capsule Take 1 capsule (100 mg total) by mouth 2 (two) times daily as needed for cough. 30 capsule 1  . Betamethasone Sodium Phosphate 6 MG/ML SOLN inject 2 CC'S OF Betamethasone sodium into left BUTTOCK    . clotrimazole-betamethasone (LOTRISONE) cream clotrimazole-betamethasone 1 %-0.05 % topical cream  APPLY TO AFFECTED AND SURROUNDING AREAS OF SKIN 2 TIMES A DAY, MORNING AND EVENING FOR 2 WEEKS    . COVID-19 Specimen Collection KIT See admin instructions. for testing    . diclofenac (FLECTOR) 1.3 % PTCH Flector 1.3 % transdermal 12 hour patch    . folic acid (FOLVITE) 1 MG tablet Take 1 mg by mouth daily.    Marland Kitchen levothyroxine (SYNTHROID) 88 MCG tablet TAKE 1 TABLET BY MOUTH EVERY DAY 90 tablet 2  . methotrexate (RHEUMATREX) 2.5 MG tablet Take 2.5 mg by mouth once a week. 8 tablets weekly  1  . mometasone (NASONEX) 50 MCG/ACT nasal spray Place 2 sprays into the nose daily. 17 g 2  . montelukast (SINGULAIR) 10 MG tablet Take 1 tablet (10 mg total) by mouth daily. 30 tablet 2  . nystatin ointment (MYCOSTATIN) APPLY TO THE AFFECTED AREA(S) BY TOPICAL ROUTE 2 TIMES PER DAY X 14 DAYS THEN AS NEEDED    . predniSONE (DELTASONE) 5 MG  tablet     . sulfaSALAzine (AZULFIDINE) 500 MG tablet Take 1,000 mg by mouth 2 (two) times daily.    . Vitamin D, Ergocalciferol, (DRISDOL) 1.25 MG (50000 UT) CAPS capsule TAKE 1 CAPSULE BY MOUTH 2 TIMES EVERY WEEK 8 capsule 0  . potassium chloride SA (KLOR-CON) 20 MEQ tablet Take 1 tablet (20 mEq total) by mouth daily for 7 days. 7 tablet 0   No current facility-administered medications on file prior to visit.    No Known Allergies   Objective: There were no vitals filed for this visit.  Vascular Examination: Capillary refill time <3 seconds x 10 digits. Dorsalis pedis and posterior tibial pulses present b/l. No digital hair x 10 digits. Skin temperature gradient within normal limits b/l LE. No pedal edema BLE.   Dermatological Examination: Skin with normal turgor, texture and tone. Toenails well maintained digits 1-5 b/l. No open wounds b/l. No interdigital macerations.   Musculoskeletal: Muscle strength 5/5 to all LE muscle groups. Patient ambulates independently. Pain on palpation 2nd webspace left foot. No Mulder's click noted.   Neurological Examination:  Sensation intact with 10 gram monofilament. Vibratory sensation intact.  Musculoskeletal Examination: +Pain elicited left 2nd webspace with medial to lateral compression of metatarsals. Negative  Mulder's click noted.  Assessment: Neuroma 2nd webspace left foot, improving Left foot pain  Plan: 1. Discussed neuroma etiology, pathology, conservative vs. surgical therapies. She has had orthotics in the past and does not want to proceed with this option yet. She does not want to explore surgical option right now either. Patient consented to local dehydrated injection to left 2nd webspace.   2. A sterile betadine skin prep was applied dorsal forefoot area left foot. Ethyl chloride cold spray was used pre-injection.  A dehydrated alcohol injection (consisting of 1/2 cc 0.5% bupivacaine with epinephrine and 1/2 cc 4% dehydrated  alcohol) was infiltrated into the left 2nd webspace.  The patient tolerated the injection well and noted relief of pain symptoms.  3. Placed neuroma pad in left shoe. 4. Patient instructed to keep pain diary of symptoms. Patient is to challenge her symptoms by wearing tapered shoe gear to see if her symptoms have resolved or are worsening.  5. Patient to continue soft, supportive shoe gear daily. 6. Report any pedal injuries to medial professional immediately. 7. Follow up 3 weeks. 8. Patient to call the office should there be any questions/concerns in the interim.

## 2020-04-23 ENCOUNTER — Other Ambulatory Visit: Payer: Self-pay

## 2020-04-23 ENCOUNTER — Ambulatory Visit (INDEPENDENT_AMBULATORY_CARE_PROVIDER_SITE_OTHER): Payer: Medicare Other | Admitting: Internal Medicine

## 2020-04-23 ENCOUNTER — Ambulatory Visit (INDEPENDENT_AMBULATORY_CARE_PROVIDER_SITE_OTHER): Payer: BC Managed Care – PPO

## 2020-04-23 ENCOUNTER — Encounter: Payer: Self-pay | Admitting: Internal Medicine

## 2020-04-23 VITALS — BP 126/70 | HR 90 | Temp 98.2°F | Ht 61.8 in | Wt 161.0 lb

## 2020-04-23 DIAGNOSIS — Z0000 Encounter for general adult medical examination without abnormal findings: Secondary | ICD-10-CM | POA: Diagnosis not present

## 2020-04-23 DIAGNOSIS — R109 Unspecified abdominal pain: Secondary | ICD-10-CM | POA: Diagnosis not present

## 2020-04-23 DIAGNOSIS — E039 Hypothyroidism, unspecified: Secondary | ICD-10-CM | POA: Diagnosis not present

## 2020-04-23 DIAGNOSIS — Z6829 Body mass index (BMI) 29.0-29.9, adult: Secondary | ICD-10-CM

## 2020-04-23 DIAGNOSIS — E663 Overweight: Secondary | ICD-10-CM | POA: Diagnosis not present

## 2020-04-23 LAB — POCT URINALYSIS DIPSTICK
Bilirubin, UA: NEGATIVE
Blood, UA: NEGATIVE
Glucose, UA: NEGATIVE
Ketones, UA: NEGATIVE
Leukocytes, UA: NEGATIVE
Nitrite, UA: NEGATIVE
Protein, UA: NEGATIVE
Spec Grav, UA: 1.015 (ref 1.010–1.025)
Urobilinogen, UA: 0.2 E.U./dL
pH, UA: 7 (ref 5.0–8.0)

## 2020-04-23 NOTE — Progress Notes (Signed)
This visit occurred during the SARS-CoV-2 public health emergency.  Safety protocols were in place, including screening questions prior to the visit, additional usage of staff PPE, and extensive cleaning of exam room while observing appropriate contact time as indicated for disinfecting solutions.  Subjective:   Evelyn Orr is a 74 y.o. female who presents for Medicare Annual (Subsequent) preventive examination.  Review of Systems     Cardiac Risk Factors include: advanced age (>57mn, >>4women);sedentary lifestyle     Objective:    Today's Vitals   04/23/20 0902  BP: 126/70  Pulse: 90  Temp: 98.2 F (36.8 C)  TempSrc: Oral  SpO2: 98%  Weight: 161 lb (73 kg)  Height: 5' 1.8" (1.57 m)   Body mass index is 29.64 kg/m.  Advanced Directives 04/23/2020 12/10/2019 04/10/2019 06/14/2017 06/21/2016 06/15/2016 07/21/2015  Does Patient Have a Medical Advance Directive? No No No No No No No  Would patient like information on creating a medical advance directive? No - Patient declined - - No - Patient declined No - Patient declined - No - patient declined information    Current Medications (verified) Outpatient Encounter Medications as of 04/23/2020  Medication Sig  . anastrozole (ARIMIDEX) 1 MG tablet Take 1 tablet (1 mg total) by mouth daily.  . Azelastine HCl 0.15 % SOLN 1 SPRAY EACH NOSTRIL DAILY AS NEEDED FOR NASAL CONGESTION  . benzonatate (TESSALON) 100 MG capsule Take 1 capsule (100 mg total) by mouth 2 (two) times daily as needed for cough.  . folic acid (FOLVITE) 1 MG tablet Take 1 mg by mouth daily.  .Marland Kitchenlevothyroxine (SYNTHROID) 88 MCG tablet TAKE 1 TABLET BY MOUTH EVERY DAY  . methotrexate (RHEUMATREX) 2.5 MG tablet Take 2.5 mg by mouth once a week. 8 tablets weekly  . mometasone (NASONEX) 50 MCG/ACT nasal spray Place 2 sprays into the nose daily.  .Marland Kitchennystatin ointment (MYCOSTATIN) APPLY TO THE AFFECTED AREA(S) BY TOPICAL ROUTE 2 TIMES PER DAY X 14 DAYS THEN AS NEEDED  .  sulfaSALAzine (AZULFIDINE) 500 MG tablet Take 1,000 mg by mouth 2 (two) times daily.  . Vitamin D, Ergocalciferol, (DRISDOL) 1.25 MG (50000 UT) CAPS capsule TAKE 1 CAPSULE BY MOUTH 2 TIMES EVERY WEEK  . albuterol (VENTOLIN HFA) 108 (90 Base) MCG/ACT inhaler Inhale 2 puffs into the lungs every 6 (six) hours as needed for wheezing or shortness of breath. (Patient not taking: Reported on 04/23/2020)  . aspirin 325 MG EC tablet aspirin 325 mg tablet,delayed release  TAKE 1 TABLET (325 MG TOTAL) BY MOUTH DAILY. (Patient not taking: Reported on 04/23/2020)  . Betamethasone Sodium Phosphate 6 MG/ML SOLN inject 2 CC'S OF Betamethasone sodium into left BUTTOCK (Patient not taking: Reported on 04/23/2020)  . clotrimazole-betamethasone (LOTRISONE) cream clotrimazole-betamethasone 1 %-0.05 % topical cream  APPLY TO AFFECTED AND SURROUNDING AREAS OF SKIN 2 TIMES A DAY, MORNING AND EVENING FOR 2 WEEKS (Patient not taking: Reported on 04/23/2020)  . COVID-19 Specimen Collection KIT See admin instructions. for testing  . diclofenac (FLECTOR) 1.3 % PTCH Flector 1.3 % transdermal 12 hour patch (Patient not taking: Reported on 04/23/2020)  . montelukast (SINGULAIR) 10 MG tablet Take 1 tablet (10 mg total) by mouth daily. (Patient not taking: Reported on 04/23/2020)  . potassium chloride SA (KLOR-CON) 20 MEQ tablet Take 1 tablet (20 mEq total) by mouth daily for 7 days.  . predniSONE (DELTASONE) 5 MG tablet  (Patient not taking: Reported on 04/23/2020)   No facility-administered encounter medications on file as  of 04/23/2020.    Allergies (verified) Patient has no known allergies.   History: Past Medical History:  Diagnosis Date  . Arthritis   . Breast cancer ([place])   . Breast cancer of upper-outer quadrant of right female breast ([place]) 03/20/2015  . Cough   . Hypothyroidism   . Nasal congestion   . PONV (postoperative nausea and vomiting)   . Rheumatoid arthritis(714.0)   . Seasonal allergies   . Thyroid  disease    hypothyroidism   Past Surgical History:  Procedure Laterality Date  . BREAST LUMPECTOMY WITH RADIOACTIVE SEED LOCALIZATION Right 04/09/2015   Procedure: BREAST LUMPECTOMY WITH RADIOACTIVE SEED LOCALIZATION;  Surgeon: Rolm Bookbinder, MD;  Location: [place];  Service: General;  Laterality: Right;  . BREAST REDUCTION SURGERY  2001  . CALCANEAL OSTEOTOMY Left 10/11/2013   Procedure: LEFT CALCANEAL OSTEOTOMY;  Surgeon: Wylene Simmer, MD;  Location: [place];  Service: Orthopedics;  Laterality: Left;  . CARPAL TUNNEL RELEASE  2006   rt  . CARPAL TUNNEL RELEASE     left  . CERVICAL FUSION  2009  . COLONOSCOPY     2002,2005,2008 w/[facility]  . GASTROCNEMIUS RECESSION Left 10/11/2013   Procedure: LEFT GASTROC RECESSION; LEFT POSTERIOR TIBIAL TENOLYSIS;  Surgeon: Wylene Simmer, MD;  Location: [place];  Service: Orthopedics;  Laterality: Left;  . KNEE ARTHROSCOPY  2004   right  . POLYPECTOMY    . REMOVAL OF IMPLANT Left 02/14/2014   Procedure: LEFT FOOT REMOVAL OF DEEP IMPLANT;  Surgeon: Wylene Simmer, MD;  Location: [place];  Service: Orthopedics;  Laterality: Left;  . SHOULDER ARTHROSCOPY W/ ROTATOR CUFF REPAIR  2006   right  . THYROIDECTOMY     age 26  . TONSILLECTOMY    . TOTAL KNEE ARTHROPLASTY Right 06/21/2016   Procedure: RIGHT TOTAL KNEE ARTHROPLASTY;  Surgeon: Paralee Cancel, MD;  Location: [facility];  Service: Orthopedics;  Laterality: Right;  . TUBAL LIGATION    . WRIST ARTHRODESIS  2003   left    Family History  Problem Relation Age of Onset  . Heart disease Father   . Heart attack Father   . Hypertension Mother   . Breast cancer Paternal Aunt        dx. 37 or younger  . Colon cancer Paternal Uncle   . Prostate cancer Maternal Uncle   . Other Sister        high breast density  . Prostate cancer Brother 31       unknown Gleason  . Prostate cancer Maternal Grandfather        dx. older age  . Colon  polyps Son 91       colonoscopy for unspecified reason; found some colon polyps, has not had one since  . Prostate cancer Maternal Uncle   . Prostate cancer Maternal Uncle   . Colon cancer Other   . Cancer Other        MGM's sister  . Breast cancer Cousin        4 maternal cousin had breast cancer   . Rectal cancer Neg Hx   . Stomach cancer Neg Hx    Social History   Socioeconomic History  . Marital status: Married    Spouse name: Not on file  . Number of children: Not on file  . Years of education: Not on file  . Highest education level: Not on file  Occupational History  . Occupation: retired  Tobacco  Use  . Smoking status: Former Smoker    Packs/day: 0.25    Years: 21.00    Pack years: 5.25    Types: Cigarettes    Quit date: 02/13/1984    Years since quitting: 36.2  . Smokeless tobacco: Never Used  Vaping Use  . Vaping Use: Never used  Substance and Sexual Activity  . Alcohol use: No  . Drug use: No  . Sexual activity: Yes    Birth control/protection: Post-menopausal  Other Topics Concern  . Not on file  Social History Narrative  . Not on file   Social Determinants of Health   Financial Resource Strain: Low Risk   . Difficulty of Paying Living Expenses: Not hard at all  Food Insecurity: No Food Insecurity  . Worried About Charity fundraiser in the Last Year: Never true  . Ran Out of Food in the Last Year: Never true  Transportation Needs: No Transportation Needs  . Lack of Transportation (Medical): No  . Lack of Transportation (Non-Medical): No  Physical Activity: Inactive  . Days of Exercise per Week: 0 days  . Minutes of Exercise per Session: 0 min  Stress: No Stress Concern Present  . Feeling of Stress : Not at all  Social Connections:   . Frequency of Communication with Friends and Family: Not on file  . Frequency of Social Gatherings with Friends and Family: Not on file  . Attends Religious Services: Not on file  . Active Member of Clubs or  Organizations: Not on file  . Attends Archivist Meetings: Not on file  . Marital Status: Not on file    Tobacco Counseling Counseling given: Not Answered   Clinical Intake:  Pre-visit preparation completed: Yes  Pain : No/denies pain     Nutritional Status: BMI 25 -29 Overweight Nutritional Risks: None Diabetes: No  How often do you need to have someone help you when you read instructions, pamphlets, or other written materials from your doctor or pharmacy?: 1 - Never What is the last grade level you completed in school?: 12th grade  Diabetic? no  Interpreter Needed?: No  Information entered by :: NAllen LPN   Activities of Daily Living In your present state of health, do you have any difficulty performing the following activities: 04/23/2020  Hearing? N  Vision? N  Difficulty concentrating or making decisions? N  Walking or climbing stairs? N  Dressing or bathing? N  Doing errands, shopping? N  Preparing Food and eating ? N  Using the Toilet? N  In the past six months, have you accidently leaked urine? N  Do you have problems with loss of bowel control? N  Managing your Medications? N  Managing your Finances? N  Housekeeping or managing your Housekeeping? N  Some recent data might be hidden    Patient Care Team: [name], MD as PCP - General (Internal Medicine) Earnstine Regal, PA-C as Physician Assistant (Obstetrics and Gynecology) Rolm Bookbinder, MD as Consulting Physician (General Surgery) Truitt Merle, MD as Consulting Physician (Hematology) Thea Silversmith, MD as Consulting Physician (Radiation Oncology) Mauro Kaufmann, RN as Registered Nurse Rockwell Germany, RN as Registered Nurse Jake Shark Johny Blamer, NP as Nurse Practitioner (Hematology and Oncology)  Indicate any recent Medical Services you may have received from other than [facility] providers in the past year (date may be approximate).     Assessment:   This is a routine  wellness examination for Nan.  Hearing/Vision screen  Hearing Screening  125Hz  250Hz  500Hz  1000Hz  2000Hz  3000Hz  4000Hz  6000Hz  8000Hz   Right ear:           Left ear:           Vision Screening Comments: Regular eye exams, [facility]  Dietary issues and exercise activities discussed: Current Exercise Habits: The patient does not participate in regular exercise at present  Goals    . DIET - INCREASE WATER INTAKE     04/10/2019, wants to increase water intake by drinking through out the day    . Patient Stated     04/23/2020, stay healthy      Depression Screen PHQ 2/9 Scores 04/23/2020 09/04/2019 04/10/2019 03/19/2019 07/03/2018 03/13/2018 03/13/2018  PHQ - 2 Score 0 0 0 0 0 0 0  PHQ- 9 Score - - 3 - - - -    Fall Risk Fall Risk  04/23/2020 04/10/2019 03/19/2019 07/03/2018 03/13/2018  Falls in the past year? 0 0 0 0 No  Comment - - - - -  Number falls in past yr: - 0 - - -  Comment - - - - -  Injury with Fall? - - - - -  Risk for fall due to : Medication side effect Medication side effect - - -  Follow up Falls evaluation completed;Education provided;Falls prevention discussed Falls evaluation completed;Falls prevention discussed - - -    Any stairs in or around the home? Yes  If so, are there any without handrails? No  Home free of loose throw rugs in walkways, pet beds, electrical cords, etc? Yes  Adequate lighting in your home to reduce risk of falls? Yes   ASSISTIVE DEVICES UTILIZED TO PREVENT FALLS:  Life alert? No  Use of a cane, walker or w/c? No  Grab bars in the bathroom? No  Shower chair or bench in shower? Yes  Elevated toilet seat or a handicapped toilet? Yes   TIMED UP AND GO:  Was the test performed? No .   Gait steady and fast without use of assistive device  Cognitive Function:     6CIT Screen 04/23/2020 04/10/2019  What Year? 0 points 0 points  What month? 0 points 0 points  What time? 0 points 0 points  Count back from 20 0 points 0  points  Months in reverse 0 points 0 points  Repeat phrase 2 points 2 points  Total Score 2 2    Immunizations Immunization History  Administered Date(s) Administered  . PFIZER SARS-COV-2 Vaccination 07/06/2019, 07/27/2019    TDAP status: Due, Education has been provided regarding the importance of this vaccine. Advised may receive this vaccine at local pharmacy or Health Dept. Aware to provide a copy of the vaccination record if obtained from local pharmacy or Health Dept. Verbalized acceptance and understanding. Flu Vaccine status: Declined, Education has been provided regarding the importance of this vaccine but patient still declined. Advised may receive this vaccine at local pharmacy or Health Dept. Aware to provide a copy of the vaccination record if obtained from local pharmacy or Health Dept. Verbalized acceptance and understanding. Pneumococcal vaccine status: Declined,  Education has been provided regarding the importance of this vaccine but patient still declined. Advised may receive this vaccine at local pharmacy or Health Dept. Aware to provide a copy of the vaccination record if obtained from local pharmacy or Health Dept. Verbalized acceptance and understanding.  Covid-19 vaccine status: Completed vaccines  Qualifies for Shingles Vaccine? Yes   Zostavax completed No   Shingrix Completed?: No.  Education has been provided regarding the importance of this vaccine. Patient has been advised to call insurance company to determine out of pocket expense if they have not yet received this vaccine. Advised may also receive vaccine at local pharmacy or Health Dept. Verbalized acceptance and understanding.  Screening Tests Health Maintenance  Topic Date Due  . INFLUENZA VACCINE  08/28/2020 (Originally 12/30/2019)  . COLONOSCOPY  03/31/2021 (Originally 04/03/2019)  . TETANUS/TDAP  04/23/2021 (Originally 03/01/1965)  . PNA vac Low Risk Adult (1 of 2 - PCV13) 04/23/2021 (Originally  02/28/2013)  . MAMMOGRAM  04/07/2021  . DEXA SCAN  Completed  . COVID-19 Vaccine  Completed  . Hepatitis C Screening  Completed    Health Maintenance  There are no preventive care reminders to display for this patient.  Colorectal cancer screening: Completed 04/02/2014. Repeat every 7 years Mammogram status: Completed 04/07/2020. Repeat every year Bone Density status: Completed 06/08/2018. Results reflect: Bone density results: OSTEOPENIA. Repeat every 2 years.  Lung Cancer Screening: (Low Dose CT Chest recommended if Age 92-80 years, 30 pack-year currently smoking OR have quit w/in 15years.) does not qualify.   Lung Cancer Screening Referral: no  Additional Screening:  Hepatitis C Screening: does qualify; Completed 03/19/2019  Vision Screening: Recommended annual ophthalmology exams for early detection of glaucoma and other disorders of the eye. Is the patient up to date with their annual eye exam?  Yes  Who is the provider or what is the name of the office in which the patient attends annual eye exams? [facility] If pt is not established with a provider, would they like to be referred to a provider to establish care? No .   Dental Screening: Recommended annual dental exams for proper oral hygiene  Community Resource Referral / Chronic Care Management: CRR required this visit?  No   CCM required this visit?  No      Plan:     I have personally reviewed and noted the following in the patient's chart:   . Medical and social history . Use of alcohol, tobacco or illicit drugs  . Current medications and supplements . Functional ability and status . Nutritional status . Physical activity . Advanced directives . List of other physicians . Hospitalizations, surgeries, and ER visits in previous 12 months . Vitals . Screenings to include cognitive, depression, and falls . Referrals and appointments  In addition, I have reviewed and discussed with patient certain  preventive protocols, quality metrics, and best practice recommendations. A written personalized care plan for preventive services as well as general preventive health recommendations were provided to patient.     Kellie Simmering, LPN   [id number]   Nurse Notes:

## 2020-04-23 NOTE — Patient Instructions (Signed)
Health Maintenance After Age 72 After age 72, you are at a higher risk for certain long-term diseases and infections as well as injuries from falls. Falls are a major cause of broken bones and head injuries in people who are older than age 72. Getting regular preventive care can help to keep you healthy and well. Preventive care includes getting regular testing and making lifestyle changes as recommended by your health care provider. Talk with your health care provider about:  Which screenings and tests you should have. A screening is a test that checks for a disease when you have no symptoms.  A diet and exercise plan that is right for you. What should I know about screenings and tests to prevent falls? Screening and testing are the best ways to find a health problem early. Early diagnosis and treatment give you the best chance of managing medical conditions that are common after age 72. Certain conditions and lifestyle choices may make you more likely to have a fall. Your health care provider may recommend:  Regular vision checks. Poor vision and conditions such as cataracts can make you more likely to have a fall. If you wear glasses, make sure to get your prescription updated if your vision changes.  Medicine review. Work with your health care provider to regularly review all of the medicines you are taking, including over-the-counter medicines. Ask your health care provider about any side effects that may make you more likely to have a fall. Tell your health care provider if any medicines that you take make you feel dizzy or sleepy.  Osteoporosis screening. Osteoporosis is a condition that causes the bones to get weaker. This can make the bones weak and cause them to break more easily.  Blood pressure screening. Blood pressure changes and medicines to control blood pressure can make you feel dizzy.  Strength and balance checks. Your health care provider may recommend certain tests to check your  strength and balance while standing, walking, or changing positions.  Foot health exam. Foot pain and numbness, as well as not wearing proper footwear, can make you more likely to have a fall.  Depression screening. You may be more likely to have a fall if you have a fear of falling, feel emotionally low, or feel unable to do activities that you used to do.  Alcohol use screening. Using too much alcohol can affect your balance and may make you more likely to have a fall. What actions can I take to lower my risk of falls? General instructions  Talk with your health care provider about your risks for falling. Tell your health care provider if: ? You fall. Be sure to tell your health care provider about all falls, even ones that seem minor. ? You feel dizzy, sleepy, or off-balance.  Take over-the-counter and prescription medicines only as told by your health care provider. These include any supplements.  Eat a healthy diet and maintain a healthy weight. A healthy diet includes low-fat dairy products, low-fat (lean) meats, and fiber from whole grains, beans, and lots of fruits and vegetables. Home safety  Remove any tripping hazards, such as rugs, cords, and clutter.  Install safety equipment such as grab bars in bathrooms and safety rails on stairs.  Keep rooms and walkways well-lit. Activity   Follow a regular exercise program to stay fit. This will help you maintain your balance. Ask your health care provider what types of exercise are appropriate for you.  If you need a cane or   walker, use it as recommended by your health care provider.  Wear supportive shoes that have nonskid soles. Lifestyle  Do not drink alcohol if your health care provider tells you not to drink.  If you drink alcohol, limit how much you have: ? 0-1 drink a day for women. ? 0-2 drinks a day for men.  Be aware of how much alcohol is in your drink. In the [place] one drink equals one typical bottle of beer (12  oz), one-half glass of wine (5 oz), or one shot of hard liquor (1 oz).  Do not use any products that contain nicotine or tobacco, such as cigarettes and e-cigarettes. If you need help quitting, ask your health care provider. Summary  Having a healthy lifestyle and getting preventive care can help to protect your health and wellness after age 72.  Screening and testing are the best way to find a health problem early and help you avoid having a fall. Early diagnosis and treatment give you the best chance for managing medical conditions that are more common for people who are older than age 72.  Falls are a major cause of broken bones and head injuries in people who are older than age 72. Take precautions to prevent a fall at home.  Work with your health care provider to learn what changes you can make to improve your health and wellness and to prevent falls. This information is not intended to replace advice given to you by your health care provider. Make sure you discuss any questions you have with your health care provider. Document Revised: 09/07/2018 Document Reviewed: 03/30/2017 Elsevier Patient Education  2020 [employer]  

## 2020-04-23 NOTE — Progress Notes (Signed)
I,Katawbba Wiggins,acting as a Education administrator for Maximino Greenland, MD.,have documented all relevant documentation on the behalf of Maximino Greenland, MD,as directed by  Maximino Greenland, MD while in the presence of Maximino Greenland, MD.  This visit occurred during the SARS-CoV-2 public health emergency.  Safety protocols were in place, including screening questions prior to the visit, additional usage of staff PPE, and extensive cleaning of exam room while observing appropriate contact time as indicated for disinfecting solutions.  Subjective:     Patient ID: Evelyn Orr , female    DOB: 14-Mar-1946 , 74 y.o.   MRN: [id number]   Chief Complaint  Patient presents with  . Annual Exam  . Hypothyroidism    HPI  She is here today for a full physical examination.  She is followed by Earnstine Regal, PA for her GYN exam. She has no specific concerns or complaints at this time.   Thyroid Problem Presents for follow-up visit. Patient reports no constipation, heat intolerance, hoarse voice, leg swelling, palpitations, tremors or visual change. The symptoms have been stable.     Past Medical History:  Diagnosis Date  . Arthritis   . Breast cancer ([place])   . Breast cancer of upper-outer quadrant of right female breast ([place]) 03/20/2015  . Cough   . Hypothyroidism   . Nasal congestion   . PONV (postoperative nausea and vomiting)   . Rheumatoid arthritis(714.0)   . Seasonal allergies   . Thyroid disease    hypothyroidism     Family History  Problem Relation Age of Onset  . Heart disease Father   . Heart attack Father   . Hypertension Mother   . Breast cancer Paternal Aunt        dx. 60 or younger  . Colon cancer Paternal Uncle   . Prostate cancer Maternal Uncle   . Other Sister        high breast density  . Prostate cancer Brother 55       unknown Gleason  . Prostate cancer Maternal Grandfather        dx. older age  . Colon polyps Son 60       colonoscopy for unspecified reason; found  some colon polyps, has not had one since  . Prostate cancer Maternal Uncle   . Prostate cancer Maternal Uncle   . Colon cancer Other   . Cancer Other        MGM's sister  . Breast cancer Cousin        4 maternal cousin had breast cancer   . Rectal cancer Neg Hx   . Stomach cancer Neg Hx      Current Outpatient Medications:  .  albuterol (VENTOLIN HFA) 108 (90 Base) MCG/ACT inhaler, Inhale 2 puffs into the lungs every 6 (six) hours as needed for wheezing or shortness of breath. (Patient not taking: Reported on 04/23/2020), Disp: 18 g, Rfl: 1 .  anastrozole (ARIMIDEX) 1 MG tablet, Take 1 tablet (1 mg total) by mouth daily., Disp: 90 tablet, Rfl: 1 .  aspirin 325 MG EC tablet, aspirin 325 mg tablet,delayed release  TAKE 1 TABLET (325 MG TOTAL) BY MOUTH DAILY. (Patient not taking: Reported on 04/23/2020), Disp: , Rfl:  .  Azelastine HCl 0.15 % SOLN, 1 SPRAY EACH NOSTRIL DAILY AS NEEDED FOR NASAL CONGESTION, Disp: , Rfl: 1 .  benzonatate (TESSALON) 100 MG capsule, Take 1 capsule (100 mg total) by mouth 2 (two) times daily as needed for cough., Disp: 30  capsule, Rfl: 1 .  Betamethasone Sodium Phosphate 6 MG/ML SOLN, inject 2 CC'S OF Betamethasone sodium into left BUTTOCK (Patient not taking: Reported on 04/23/2020), Disp: , Rfl:  .  clotrimazole-betamethasone (LOTRISONE) cream, clotrimazole-betamethasone 1 %-0.05 % topical cream  APPLY TO AFFECTED AND SURROUNDING AREAS OF SKIN 2 TIMES A DAY, MORNING AND EVENING FOR 2 WEEKS (Patient not taking: Reported on 04/23/2020), Disp: , Rfl:  .  COVID-19 Specimen Collection KIT, See admin instructions. for testing, Disp: , Rfl:  .  diclofenac (FLECTOR) 1.3 % PTCH, Flector 1.3 % transdermal 12 hour patch (Patient not taking: Reported on 04/23/2020), Disp: , Rfl:  .  folic acid (FOLVITE) 1 MG tablet, Take 1 mg by mouth daily., Disp: , Rfl:  .  levothyroxine (SYNTHROID) 88 MCG tablet, TAKE 1 TABLET BY MOUTH EVERY DAY, Disp: 90 tablet, Rfl: 2 .  methotrexate  (RHEUMATREX) 2.5 MG tablet, Take 2.5 mg by mouth once a week. 8 tablets weekly, Disp: , Rfl: 1 .  mometasone (NASONEX) 50 MCG/ACT nasal spray, Place 2 sprays into the nose daily., Disp: 17 g, Rfl: 2 .  montelukast (SINGULAIR) 10 MG tablet, Take 1 tablet (10 mg total) by mouth daily. (Patient not taking: Reported on 04/23/2020), Disp: 30 tablet, Rfl: 2 .  nystatin ointment (MYCOSTATIN), APPLY TO THE AFFECTED AREA(S) BY TOPICAL ROUTE 2 TIMES PER DAY X 14 DAYS THEN AS NEEDED, Disp: , Rfl:  .  potassium chloride SA (KLOR-CON) 20 MEQ tablet, Take 1 tablet (20 mEq total) by mouth daily for 7 days., Disp: 7 tablet, Rfl: 0 .  predniSONE (DELTASONE) 5 MG tablet, , Disp: , Rfl:  .  sulfaSALAzine (AZULFIDINE) 500 MG tablet, Take 1,000 mg by mouth 2 (two) times daily., Disp: , Rfl:  .  Vitamin D, Ergocalciferol, (DRISDOL) 1.25 MG (50000 UT) CAPS capsule, TAKE 1 CAPSULE BY MOUTH 2 TIMES EVERY WEEK, Disp: 8 capsule, Rfl: 0   No Known Allergies   Men's preventive visit. Patient Health Questionnaire (PHQ-2) is    Clinical Support from 04/23/2020 in [facility]  PHQ-2 Total Score 0    . Patient is on a healthy diet. Marital status: Married. Relevant history for alcohol use is:  Social History   Substance and Sexual Activity  Alcohol Use No  . Relevant history for tobacco use is:  Social History   Tobacco Use  Smoking Status Former Smoker  . Packs/day: 0.25  . Years: 21.00  . Pack years: 5.25  . Types: Cigarettes  . Quit date: 02/13/1984  . Years since quitting: 36.2  Smokeless Tobacco Never Used  .   Review of Systems  Constitutional: Negative.   HENT: Negative.  Negative for hoarse voice.   Eyes: Negative.   Respiratory: Negative.   Cardiovascular: Negative.  Negative for palpitations.  Gastrointestinal: Negative.  Negative for constipation.  Endocrine: Negative.  Negative for heat intolerance.  Genitourinary: Negative.   Musculoskeletal: Negative.   Skin: Negative.    Allergic/Immunologic: Negative.   Neurological: Negative.  Negative for tremors.  Hematological: Negative.   Psychiatric/Behavioral: Negative.      Today's Vitals   04/23/20 0925  BP: 126/70  Pulse: 90  Temp: 98.2 F (36.8 C)  TempSrc: Oral  Weight: 161 lb (73 kg)  Height: 5' 1.8" (1.57 m)   Body mass index is 29.64 kg/m.  Wt Readings from Last 3 Encounters:  04/23/20 161 lb (73 kg)  04/23/20 161 lb (73 kg)  12/10/19 160 lb 12.8 oz (72.9 kg)  Objective:  Physical Exam Constitutional:      General: She is not in acute distress.    Appearance: Normal appearance. She is well-developed.  HENT:     Head: Normocephalic and atraumatic.     Right Ear: Hearing, tympanic membrane, ear canal and external ear normal. There is no impacted cerumen.     Left Ear: Hearing, tympanic membrane, ear canal and external ear normal. There is no impacted cerumen.     Nose:     Comments: Deferred, masked    Mouth/Throat:     Comments: Deferred, masked Eyes:     General: Lids are normal.     Extraocular Movements: Extraocular movements intact.     Conjunctiva/sclera: Conjunctivae normal.     Pupils: Pupils are equal, round, and reactive to light.     Funduscopic exam:    Right eye: No papilledema.        Left eye: No papilledema.  Neck:     Thyroid: No thyroid mass.     Vascular: No carotid bruit.  Cardiovascular:     Rate and Rhythm: Normal rate and regular rhythm.     Pulses: Normal pulses.     Heart sounds: Normal heart sounds. No murmur heard.   Pulmonary:     Effort: Pulmonary effort is normal.     Breath sounds: Normal breath sounds.  Chest:     Breasts: Tanner Score is 5.        Right: Normal.        Left: Normal.     Comments: Healed surgical scars Abdominal:     General: Abdomen is flat. Bowel sounds are normal. There is no distension.     Palpations: Abdomen is soft.     Tenderness: There is no abdominal tenderness.     Comments: Neg CVA tenderness. No rash noted   Musculoskeletal:        General: No swelling. Normal range of motion.     Cervical back: Full passive range of motion without pain, normal range of motion and neck supple.     Right lower leg: No edema.     Left lower leg: No edema.  Skin:    General: Skin is warm and dry.     Capillary Refill: Capillary refill takes less than 2 seconds.  Neurological:     General: No focal deficit present.     Mental Status: She is alert and oriented to person, place, and time.     Cranial Nerves: No cranial nerve deficit.     Sensory: No sensory deficit.  Psychiatric:        Mood and Affect: Mood normal.        Behavior: Behavior normal.        Thought Content: Thought content normal.        Judgment: Judgment normal.         Assessment And Plan:    1. Routine general medical examination at health care facility Comments: A full exam was performed. Importance of monthly self breast exams was discussed with the patient. PATIENT IS ADVISED TO GET 30-45 MINUTES REGULAR EXERCISE NO LESS THAN FOUR TO FIVE DAYS PER WEEK - BOTH WEIGHTBEARING EXERCISES AND AEROBIC ARE RECOMMENDED.  PATIENT IS ADVISED TO FOLLOW A HEALTHY DIET WITH AT LEAST SIX FRUITS/VEGGIES PER DAY, DECREASE INTAKE OF RED MEAT, AND TO INCREASE FISH INTAKE TO TWO DAYS PER WEEK.  MEATS/FISH SHOULD NOT BE FRIED, BAKED OR BROILED IS PREFERABLE.  I SUGGEST WEARING SPF 50  SUNSCREEN ON EXPOSED PARTS AND ESPECIALLY WHEN IN THE DIRECT SUNLIGHT FOR AN EXTENDED PERIOD OF TIME.  PLEASE AVOID FAST FOOD RESTAURANTS AND INCREASE YOUR WATER INTAKE.  2. Primary hypothyroidism Comments: I will check a thyroid panel and adjust meds as needed.  - Lipid panel - TSH - T4, free  3. Acute left flank pain Comments: I think her sx are musculoskeletal in nature. However, I will also check urinalysis. She is advised to apply topical balm to affected area prn. She is encouraged to let me know if her sx persist/worsen.  - BMP8+EGFR - POCT Urinalysis Dipstick  (81002)  4. Overweight with body mass index (BMI) 25.0-29.9 Comments: BMI 29. Her BMI is acceptable for her demographic. She is encouraged to aim for at least 150 minutes per week.     Patient was given opportunity to ask questions. Patient verbalized understanding of the plan and was able to repeat key elements of the plan. All questions were answered to their satisfaction.   Maximino Greenland, MD   I, Maximino Greenland, MD, have reviewed all documentation for this visit. The documentation on 04/27/20 for the exam, diagnosis, procedures, and orders are all accurate and complete.  THE PATIENT IS ENCOURAGED TO PRACTICE SOCIAL DISTANCING DUE TO THE COVID-19 PANDEMIC.

## 2020-04-23 NOTE — Patient Instructions (Signed)
[name] , Thank you for taking time to come for your Medicare Wellness Visit. I appreciate your ongoing commitment to your health goals. Please review the following plan we discussed and let me know if I can assist you in the future.   Screening recommendations/referrals: Colonoscopy: completed 04/02/2014, due 04/02/2021 Mammogram: completed 04/07/2020, due 04/07/2021 Bone Density: completed 06/08/2018, due 06/08/2020 Recommended yearly ophthalmology/optometry visit for glaucoma screening and checkup Recommended yearly dental visit for hygiene and checkup  Vaccinations: Influenza vaccine: decline Pneumococcal vaccine: decline Tdap vaccine: decline Shingles vaccine: decline   Covid-19: 07/27/2019, 07/06/2019  Advanced directives: Advance directive discussed with you today. Even though you declined this today please call our office should you change your mind and we can give you the proper paperwork for you to fill out.  Conditions/risks identified: none  Next appointment: Follow up in one year for your annual wellness visit    Preventive Care 65 Years and Older, Female Preventive care refers to lifestyle choices and visits with your health care provider that can promote health and wellness. What does preventive care include?  A yearly physical exam. This is also called an annual well check.  Dental exams once or twice a year.  Routine eye exams. Ask your health care provider how often you should have your eyes checked.  Personal lifestyle choices, including:  Daily care of your teeth and gums.  Regular physical activity.  Eating a healthy diet.  Avoiding tobacco and drug use.  Limiting alcohol use.  Practicing safe sex.  Taking low-dose aspirin every day.  Taking vitamin and mineral supplements as recommended by your health care provider. What happens during an annual well check? The services and screenings done by your health care provider during your annual well check  will depend on your age, overall health, lifestyle risk factors, and family history of disease. Counseling  Your health care provider may ask you questions about your:  Alcohol use.  Tobacco use.  Drug use.  Emotional well-being.  Home and relationship well-being.  Sexual activity.  Eating habits.  History of falls.  Memory and ability to understand (cognition).  Work and work Statistician.  Reproductive health. Screening  You may have the following tests or measurements:  Height, weight, and BMI.  Blood pressure.  Lipid and cholesterol levels. These may be checked every 5 years, or more frequently if you are over 25 years old.  Skin check.  Lung cancer screening. You may have this screening every year starting at age 20 if you have a 30-pack-year history of smoking and currently smoke or have quit within the past 15 years.  Fecal occult blood test (FOBT) of the stool. You may have this test every year starting at age 74.  Flexible sigmoidoscopy or colonoscopy. You may have a sigmoidoscopy every 5 years or a colonoscopy every 10 years starting at age 48.  Hepatitis C blood test.  Hepatitis B blood test.  Sexually transmitted disease (STD) testing.  Diabetes screening. This is done by checking your blood sugar (glucose) after you have not eaten for a while (fasting). You may have this done every 1-3 years.  Bone density scan. This is done to screen for osteoporosis. You may have this done starting at age 14.  Mammogram. This may be done every 1-2 years. Talk to your health care provider about how often you should have regular mammograms. Talk with your health care provider about your test results, treatment options, and if necessary, the need for more tests.  Vaccines  Your health care provider may recommend certain vaccines, such as:  Influenza vaccine. This is recommended every year.  Tetanus, diphtheria, and acellular pertussis (Tdap, Td) vaccine. You may  need a Td booster every 10 years.  Zoster vaccine. You may need this after age 54.  Pneumococcal 13-valent conjugate (PCV13) vaccine. One dose is recommended after age 77.  Pneumococcal polysaccharide (PPSV23) vaccine. One dose is recommended after age 26. Talk to your health care provider about which screenings and vaccines you need and how often you need them. This information is not intended to replace advice given to you by your health care provider. Make sure you discuss any questions you have with your health care provider. Document Released: 06/13/2015 Document Revised: 02/04/2016 Document Reviewed: 03/18/2015 Elsevier Interactive Patient Education  2017 [place] Prevention in the Home Falls can cause injuries. They can happen to people of all ages. There are many things you can do to make your home safe and to help prevent falls. What can I do on the outside of my home?  Regularly fix the edges of walkways and driveways and fix any cracks.  Remove anything that might make you trip as you walk through a door, such as a raised step or threshold.  Trim any bushes or trees on the path to your home.  Use bright outdoor lighting.  Clear any walking paths of anything that might make someone trip, such as rocks or tools.  Regularly check to see if handrails are loose or broken. Make sure that both sides of any steps have handrails.  Any raised decks and porches should have guardrails on the edges.  Have any leaves, snow, or ice cleared regularly.  Use sand or salt on walking paths during winter.  Clean up any spills in your garage right away. This includes oil or grease spills. What can I do in the bathroom?  Use night lights.  Install grab bars by the toilet and in the tub and shower. Do not use towel bars as grab bars.  Use non-skid mats or decals in the tub or shower.  If you need to sit down in the shower, use a plastic, non-slip stool.  Keep the floor  dry. Clean up any water that spills on the floor as soon as it happens.  Remove soap buildup in the tub or shower regularly.  Attach bath mats securely with double-sided non-slip rug tape.  Do not have throw rugs and other things on the floor that can make you trip. What can I do in the bedroom?  Use night lights.  Make sure that you have a light by your bed that is easy to reach.  Do not use any sheets or blankets that are too big for your bed. They should not hang down onto the floor.  Have a firm chair that has side arms. You can use this for support while you get dressed.  Do not have throw rugs and other things on the floor that can make you trip. What can I do in the kitchen?  Clean up any spills right away.  Avoid walking on wet floors.  Keep items that you use a lot in easy-to-reach places.  If you need to reach something above you, use a strong step stool that has a grab bar.  Keep electrical cords out of the way.  Do not use floor polish or wax that makes floors slippery. If you must use wax, use non-skid floor wax.  Do not have throw rugs and other things on the floor that can make you trip. What can I do with my stairs?  Do not leave any items on the stairs.  Make sure that there are handrails on both sides of the stairs and use them. Fix handrails that are broken or loose. Make sure that handrails are as long as the stairways.  Check any carpeting to make sure that it is firmly attached to the stairs. Fix any carpet that is loose or worn.  Avoid having throw rugs at the top or bottom of the stairs. If you do have throw rugs, attach them to the floor with carpet tape.  Make sure that you have a light switch at the top of the stairs and the bottom of the stairs. If you do not have them, ask someone to add them for you. What else can I do to help prevent falls?  Wear shoes that:  Do not have high heels.  Have rubber bottoms.  Are comfortable and fit you  well.  Are closed at the toe. Do not wear sandals.  If you use a stepladder:  Make sure that it is fully opened. Do not climb a closed stepladder.  Make sure that both sides of the stepladder are locked into place.  Ask someone to hold it for you, if possible.  Clearly mark and make sure that you can see:  Any grab bars or handrails.  First and last steps.  Where the edge of each step is.  Use tools that help you move around (mobility aids) if they are needed. These include:  Canes.  Walkers.  Scooters.  Crutches.  Turn on the lights when you go into a dark area. Replace any light bulbs as soon as they burn out.  Set up your furniture so you have a clear path. Avoid moving your furniture around.  If any of your floors are uneven, fix them.  If there are any pets around you, be aware of where they are.  Review your medicines with your doctor. Some medicines can make you feel dizzy. This can increase your chance of falling. Ask your doctor what other things that you can do to help prevent falls. This information is not intended to replace advice given to you by your health care provider. Make sure you discuss any questions you have with your health care provider. Document Released: 03/13/2009 Document Revised: 10/23/2015 Document Reviewed: 06/21/2014 Elsevier Interactive Patient Education  2017 [employer].

## 2020-04-24 LAB — LIPID PANEL
Chol/HDL Ratio: 3 ratio (ref 0.0–4.4)
Cholesterol, Total: 225 mg/dL — ABNORMAL HIGH (ref 100–199)
HDL: 74 mg/dL
LDL Chol Calc (NIH): 135 mg/dL — ABNORMAL HIGH (ref 0–99)
Triglycerides: 94 mg/dL (ref 0–149)
VLDL Cholesterol Cal: 16 mg/dL (ref 5–40)

## 2020-04-24 LAB — BMP8+EGFR
BUN/Creatinine Ratio: 13 (ref 12–28)
BUN: 9 mg/dL (ref 8–27)
CO2: 23 mmol/L (ref 20–29)
Calcium: 9.7 mg/dL (ref 8.7–10.3)
Chloride: 101 mmol/L (ref 96–106)
Creatinine, Ser: 0.71 mg/dL (ref 0.57–1.00)
GFR calc Af Amer: 98 mL/min/{1.73_m2} (ref >59)
GFR calc non Af Amer: 85 mL/min/{1.73_m2} (ref >59)
Glucose: 85 mg/dL (ref 65–99)
Potassium: 4 mmol/L (ref 3.5–5.2)
Sodium: 139 mmol/L (ref 134–144)

## 2020-04-24 LAB — TSH: TSH: 3.42 u[IU]/mL (ref 0.450–4.500)

## 2020-04-24 LAB — T4, FREE: Free T4: 1.19 ng/dL (ref 0.82–1.77)

## 2020-04-27 ENCOUNTER — Encounter: Payer: Self-pay | Admitting: Internal Medicine

## 2020-04-30 ENCOUNTER — Telehealth: Payer: Self-pay

## 2020-04-30 NOTE — Telephone Encounter (Signed)
-----   Message from [name], MD sent at 04/30/2020 12:08 AM EST ----- Please give her 176mcg samples if available, 2 boxes. She will take 138mcg M-F and 54mcg on Sat/Sunday. We need to recheck thyroid in six to eight weeks.  ----- Message ----- From: Michelle Nasuti, [place] Sent: 04/29/2020   5:18 PM EST To: [name], MD  The pt said she hasn't changed her diet and yes that she would like to increase the thyroid med.

## 2020-05-05 ENCOUNTER — Other Ambulatory Visit: Payer: Self-pay

## 2020-05-05 ENCOUNTER — Encounter: Payer: Self-pay | Admitting: Podiatry

## 2020-05-05 ENCOUNTER — Ambulatory Visit (INDEPENDENT_AMBULATORY_CARE_PROVIDER_SITE_OTHER): Payer: BC Managed Care – PPO | Admitting: Podiatry

## 2020-05-05 DIAGNOSIS — L600 Ingrowing nail: Secondary | ICD-10-CM

## 2020-05-05 DIAGNOSIS — M79672 Pain in left foot: Secondary | ICD-10-CM | POA: Diagnosis not present

## 2020-05-05 DIAGNOSIS — G5762 Lesion of plantar nerve, left lower limb: Secondary | ICD-10-CM

## 2020-05-05 NOTE — Patient Instructions (Signed)
EPSOM SALT FOOT SOAK INSTRUCTIONS  *IF YOU HAVE BEEN PRESCRIBED ANTIBIOTICS, TAKE AS INSTRUCTED UNTIL ALL ARE GONE*  Shopping List:  A. Plain epsom salt (not scented) B. Neosporin Cream/Ointment or Bacitracin Cream/Ointment (or prescribed antiobiotic drops/cream/ointment) C. 1-inch fabric band-aids  1.  Place 1/4 cup of epsom salts in 2 quarts of warm tap water. IF YOU ARE DIABETIC, OR HAVE NEUROPATHY, CHECK THE TEMPERATURE OF THE WATER WITH YOUR ELBOW.  2.  Submerge your foot/feet in the solution and soak for 10-15 minutes.      3.  Next, remove your foot/feet from solution, blot dry the affected area.    4.  Apply light amount of antibiotic cream/ointment and cover with fabric band-aid .  5.  This soak should be done once a day for 3 days.   6.  Monitor for any signs/symptoms of infection such as redness, swelling, odor, drainage, increased pain, or non-healing of digit.   7.  Please do not hesitate to call the office and speak to a Nurse or Doctor if you have questions.   8.  If you experience fever, chills, nightsweats, nausea or vomiting with worsening of digit, please go to the emergency room.   

## 2020-05-07 NOTE — Progress Notes (Signed)
Subjective:  Evelyn Orr presents to clinic today for follow up of left 2nd webspace neuroma. Patient states pain scale is better, describing it as a 5/10 today. Last visit was 5/10 as well.  Patient also states she has painful b/l great toes. She attempted to cut her toenails and they are now painful. Right hallux is more symptomatic than left foot. She states right hallux bled a little, but denies any purulent drainage. Digit is tender to touch.  Current Outpatient Medications on File Prior to Visit  Medication Sig Dispense Refill  . albuterol (VENTOLIN HFA) 108 (90 Base) MCG/ACT inhaler Inhale 2 puffs into the lungs every 6 (six) hours as needed for wheezing or shortness of breath. 18 g 1  . amoxicillin (AMOXIL) 875 MG tablet amoxicillin 875 mg tablet    . anastrozole (ARIMIDEX) 1 MG tablet Take 1 tablet (1 mg total) by mouth daily. 90 tablet 1  . aspirin 325 MG EC tablet aspirin 325 mg tablet,delayed release  TAKE 1 TABLET (325 MG TOTAL) BY MOUTH DAILY.    Marland Kitchen Azelastine HCl 0.15 % SOLN 1 SPRAY EACH NOSTRIL DAILY AS NEEDED FOR NASAL CONGESTION  1  . benzonatate (TESSALON) 100 MG capsule Take 1 capsule (100 mg total) by mouth 2 (two) times daily as needed for cough. 30 capsule 1  . Betamethasone Sodium Phosphate 6 MG/ML SOLN inject 2 CC'S OF Betamethasone sodium into left BUTTOCK    . clotrimazole-betamethasone (LOTRISONE) cream clotrimazole-betamethasone 1 %-0.05 % topical cream  APPLY TO AFFECTED AND SURROUNDING AREAS OF SKIN 2 TIMES A DAY, MORNING AND EVENING FOR 2 WEEKS    . COVID-19 Specimen Collection KIT See admin instructions. for testing    . diclofenac (FLECTOR) 1.3 % PTCH Flector 1.3 % transdermal 12 hour patch    . folic acid (FOLVITE) 1 MG tablet Take 1 mg by mouth daily.    Marland Kitchen levothyroxine (SYNTHROID) 100 MCG tablet Take 100 mcg by mouth daily before breakfast. Monday - Friday    . levothyroxine (SYNTHROID) 88 MCG tablet TAKE 1 TABLET BY MOUTH EVERY DAY (Patient taking  differently: Saturday and Sunday) 90 tablet 2  . methotrexate (RHEUMATREX) 2.5 MG tablet Take 2.5 mg by mouth once a week. 8 tablets weekly  1  . mometasone (NASONEX) 50 MCG/ACT nasal spray Place 2 sprays into the nose daily. 17 g 2  . montelukast (SINGULAIR) 10 MG tablet Take 1 tablet (10 mg total) by mouth daily. 30 tablet 2  . nystatin ointment (MYCOSTATIN) APPLY TO THE AFFECTED AREA(S) BY TOPICAL ROUTE 2 TIMES PER DAY X 14 DAYS THEN AS NEEDED    . potassium chloride SA (KLOR-CON M20) 20 MEQ tablet Klor-Con M20 mEq tablet,extended release    . predniSONE (DELTASONE) 5 MG tablet     . sulfaSALAzine (AZULFIDINE) 500 MG tablet Take 1,000 mg by mouth 2 (two) times daily.    . Vitamin D, Ergocalciferol, (DRISDOL) 1.25 MG (50000 UT) CAPS capsule TAKE 1 CAPSULE BY MOUTH 2 TIMES EVERY WEEK 8 capsule 0  . potassium chloride SA (KLOR-CON) 20 MEQ tablet Take 1 tablet (20 mEq total) by mouth daily for 7 days. 7 tablet 0   No current facility-administered medications on file prior to visit.    No Known Allergies   Objective: There were no vitals filed for this visit.  Vascular Examination: Capillary refill time <3 seconds x 10 digits. Dorsalis pedis and posterior tibial pulses present b/l. No digital hair x 10 digits. Skin temperature gradient within normal  limits b/l LE. No pedal edema BLE.   Dermatological Examination: Skin with normal turgor, texture and tone. Toenails well maintained digits 2-5 b/l. No open wounds b/l. No interdigital macerations.  Incurvated nailplate L hallux and R hallux with tenderness to palpation right >left. There is dried heme noted on right hallux. No erythema, no edema, no active drainage noted. No fluctuance.   Musculoskeletal: Muscle strength 5/5 to all LE muscle groups. Patient ambulates independently. Pain on palpation 2nd webspace left foot. No Mulder's click noted.   Neurological Examination:  Sensation intact with 10 gram monofilament. Vibratory sensation  intact.  Musculoskeletal Examination: +Pain elicited left 2nd webspace with medial to lateral compression of metatarsals. Negative  Mulder's click noted.  Assessment: Neuroma 2nd webspace left foot Ingrown toenail b/l great toes, noninfected Left foot pain   Plan: 1. Discussed neuroma etiology, pathology, conservative vs. surgical therapies. She does not want to explore surgical option right now either. Patient consented to local dehydrated injection to left 2nd webspace.   2. I presented orthotics with integrated neuroma pad as another option on today.  3. Offending nail borders debrided and curretaged L hallux and R hallux. Borders cleansed with alcohol. Antibiotic ointment applied. Patient instructed to start once daily epsom salt soaks for the next three days. 4. For neuroma left 2nd webspace, a sterile betadine skin prep was applied dorsal forefoot area left foot. Ethyl chloride cold spray was used pre-injection.  A dehydrated alcohol injection (consisting of 1/2 cc 0.5% bupivacaine with epinephrine and 1/2 cc 4% dehydrated alcohol) was infiltrated into the left 2nd webspace.  The patient tolerated the injection well and noted relief of pain symptoms.  5. She has neuroma pad in left shoe. 6. Patient to continue soft, supportive shoe gear daily. 7. Report any pedal injuries to medial professional immediately. 8. Follow up 3 weeks. 9. Patient to call the office should there be any questions/concerns in the interim.

## 2020-05-13 DIAGNOSIS — Z6828 Body mass index (BMI) 28.0-28.9, adult: Secondary | ICD-10-CM | POA: Diagnosis not present

## 2020-05-13 DIAGNOSIS — B354 Tinea corporis: Secondary | ICD-10-CM | POA: Diagnosis not present

## 2020-05-13 DIAGNOSIS — Z01419 Encounter for gynecological examination (general) (routine) without abnormal findings: Secondary | ICD-10-CM | POA: Diagnosis not present

## 2020-05-13 DIAGNOSIS — N898 Other specified noninflammatory disorders of vagina: Secondary | ICD-10-CM | POA: Diagnosis not present

## 2020-05-15 DIAGNOSIS — Z79899 Other long term (current) drug therapy: Secondary | ICD-10-CM | POA: Diagnosis not present

## 2020-05-15 DIAGNOSIS — M79643 Pain in unspecified hand: Secondary | ICD-10-CM | POA: Diagnosis not present

## 2020-05-15 DIAGNOSIS — M8580 Other specified disorders of bone density and structure, unspecified site: Secondary | ICD-10-CM | POA: Diagnosis not present

## 2020-05-15 DIAGNOSIS — M1990 Unspecified osteoarthritis, unspecified site: Secondary | ICD-10-CM | POA: Diagnosis not present

## 2020-05-15 DIAGNOSIS — M0589 Other rheumatoid arthritis with rheumatoid factor of multiple sites: Secondary | ICD-10-CM | POA: Diagnosis not present

## 2020-05-26 ENCOUNTER — Ambulatory Visit: Payer: BC Managed Care – PPO | Admitting: Podiatry

## 2020-05-28 ENCOUNTER — Ambulatory Visit: Payer: BC Managed Care – PPO | Admitting: Podiatry

## 2020-06-04 ENCOUNTER — Ambulatory Visit: Payer: BC Managed Care – PPO | Admitting: Podiatry

## 2020-06-09 ENCOUNTER — Other Ambulatory Visit: Payer: 59

## 2020-06-09 ENCOUNTER — Other Ambulatory Visit: Payer: Self-pay

## 2020-06-09 DIAGNOSIS — E039 Hypothyroidism, unspecified: Secondary | ICD-10-CM

## 2020-06-10 LAB — TSH: TSH: 1.59 u[IU]/mL (ref 0.450–4.500)

## 2020-06-12 ENCOUNTER — Other Ambulatory Visit: Payer: Self-pay

## 2020-06-12 NOTE — Telephone Encounter (Signed)
I left the pt a message that samples of synthroid 100 mcg is ready for pickup.

## 2020-06-23 ENCOUNTER — Ambulatory Visit (INDEPENDENT_AMBULATORY_CARE_PROVIDER_SITE_OTHER): Payer: Medicare Other

## 2020-06-23 ENCOUNTER — Other Ambulatory Visit: Payer: Self-pay

## 2020-06-23 ENCOUNTER — Ambulatory Visit (INDEPENDENT_AMBULATORY_CARE_PROVIDER_SITE_OTHER): Payer: 59 | Admitting: Podiatry

## 2020-06-23 ENCOUNTER — Encounter: Payer: Self-pay | Admitting: Podiatry

## 2020-06-23 DIAGNOSIS — M84375A Stress fracture, left foot, initial encounter for fracture: Secondary | ICD-10-CM

## 2020-06-23 DIAGNOSIS — G5762 Lesion of plantar nerve, left lower limb: Secondary | ICD-10-CM | POA: Diagnosis not present

## 2020-06-23 DIAGNOSIS — M79672 Pain in left foot: Secondary | ICD-10-CM

## 2020-06-27 NOTE — Progress Notes (Signed)
Subjective:  Evelyn Orr presents to clinic today for follow up of left 2nd webspace neuroma. Patient states pain scale is worse, describing it as a 8/10 today. Last visit, pain was 5/10. She also relates pain/swelling to left 2nd digit. She denies any episodes of trauma.   Current Outpatient Medications on File Prior to Visit  Medication Sig Dispense Refill  . albuterol (VENTOLIN HFA) 108 (90 Base) MCG/ACT inhaler Inhale 2 puffs into the lungs every 6 (six) hours as needed for wheezing or shortness of breath. 18 g 1  . amoxicillin (AMOXIL) 875 MG tablet amoxicillin 875 mg tablet    . anastrozole (ARIMIDEX) 1 MG tablet Take 1 tablet (1 mg total) by mouth daily. 90 tablet 1  . aspirin 325 MG EC tablet aspirin 325 mg tablet,delayed release  TAKE 1 TABLET (325 MG TOTAL) BY MOUTH DAILY.    Marland Kitchen Azelastine HCl 0.15 % SOLN 1 SPRAY EACH NOSTRIL DAILY AS NEEDED FOR NASAL CONGESTION  1  . benzonatate (TESSALON) 100 MG capsule Take 1 capsule (100 mg total) by mouth 2 (two) times daily as needed for cough. 30 capsule 1  . Betamethasone Sodium Phosphate 6 MG/ML SOLN inject 2 CC'S OF Betamethasone sodium into left BUTTOCK    . clotrimazole-betamethasone (LOTRISONE) cream clotrimazole-betamethasone 1 %-0.05 % topical cream  APPLY TO AFFECTED AND SURROUNDING AREAS OF SKIN 2 TIMES A DAY, MORNING AND EVENING FOR 2 WEEKS    . COVID-19 Specimen Collection KIT See admin instructions. for testing    . [facility] ANTI-FUNGAL 2 % powder Apply topically.    . diclofenac (FLECTOR) 1.3 % PTCH Flector 1.3 % transdermal 12 hour patch    . folic acid (FOLVITE) 1 MG tablet Take 1 mg by mouth daily.    Marland Kitchen levothyroxine (SYNTHROID) 100 MCG tablet Take 100 mcg by mouth daily before breakfast. Monday - Friday    . levothyroxine (SYNTHROID) 88 MCG tablet TAKE 1 TABLET BY MOUTH EVERY DAY (Patient taking differently: Saturday and Sunday) 90 tablet 2  . methotrexate (RHEUMATREX) 2.5 MG tablet Take 2.5 mg by mouth once a week. 8 tablets  weekly  1  . miconazole (MICOTIN) 2 % powder Anti-Fungal 2 % topical powder    . mometasone (NASONEX) 50 MCG/ACT nasal spray Place 2 sprays into the nose daily. 17 g 2  . montelukast (SINGULAIR) 10 MG tablet Take 1 tablet (10 mg total) by mouth daily. 30 tablet 2  . nystatin ointment (MYCOSTATIN) APPLY TO THE AFFECTED AREA(S) BY TOPICAL ROUTE 2 TIMES PER DAY X 14 DAYS THEN AS NEEDED    . potassium chloride SA (KLOR-CON M20) 20 MEQ tablet Klor-Con M20 mEq tablet,extended release    . potassium chloride SA (KLOR-CON) 20 MEQ tablet Take 1 tablet (20 mEq total) by mouth daily for 7 days. 7 tablet 0  . sulfaSALAzine (AZULFIDINE) 500 MG tablet Take 1,000 mg by mouth 2 (two) times daily.    Marland Kitchen triamcinolone (KENALOG) 0.1 % triamcinolone acetonide 0.1 % topical cream    . Vitamin D, Ergocalciferol, (DRISDOL) 1.25 MG (50000 UT) CAPS capsule TAKE 1 CAPSULE BY MOUTH 2 TIMES EVERY WEEK 8 capsule 0   No current facility-administered medications on file prior to visit.    No Known Allergies   Objective: There were no vitals filed for this visit.  Vascular Examination: Capillary refill time <3 seconds x 10 digits. Dorsalis pedis and posterior tibial pulses present b/l. No digital hair x 10 digits. Skin temperature gradient within normal limits b/l LE.  No pedal edema BLE.   Dermatological Examination: Skin with normal turgor, texture and tone. Toenails well maintained digits 2-5 b/l. No open wounds b/l. No interdigital macerations.  Musculoskeletal: Muscle strength 5/5 to all LE muscle groups. Patient ambulates independently. Exquisite pain on palpation 2nd webspace left foot. +Mulder's click noted. Slight swelling of left 2nd digit.   Neurological Examination:  Sensation intact with 10 gram monofilament. Vibratory sensation intact.  Xray findings left foot: No gas in tissues left foot. No evidence of fracture left foot.  Assessment: Neuroma 2nd webspace left foot Left foot pain    Plan: 1. Discussed neuroma conservative vs. surgical therapies. She is not improving with padding and dehydrated alcohol injections and pain has gotten worse. 2. Orthotics with integrated neuroma pad remain an option on today.  3. She has neuroma pad integrated in insole of left shoe. 4. Rule out stress fracture left foot. Xray, 3 views left foot, taken and reviewed with patient in office. 5. Will refer to Dr. Sherryle Lis for evaluation since her symptoms are worsening. 6. Patient to continue soft, supportive shoe gear daily. 7. Report any pedal injuries to medial professional immediately. 8. Patient to call the office should there be any questions/concerns in the interim. 9. Follow up 3 months with me.

## 2020-07-14 ENCOUNTER — Ambulatory Visit (INDEPENDENT_AMBULATORY_CARE_PROVIDER_SITE_OTHER): Payer: 59 | Admitting: Podiatry

## 2020-07-14 ENCOUNTER — Other Ambulatory Visit: Payer: Self-pay

## 2020-07-14 ENCOUNTER — Encounter: Payer: Self-pay | Admitting: Podiatry

## 2020-07-14 DIAGNOSIS — M79672 Pain in left foot: Secondary | ICD-10-CM | POA: Diagnosis not present

## 2020-07-14 DIAGNOSIS — G5762 Lesion of plantar nerve, left lower limb: Secondary | ICD-10-CM | POA: Diagnosis not present

## 2020-07-14 NOTE — Progress Notes (Signed)
  Subjective:  Patient ID: Evelyn Orr, female    DOB: 19-Dec-1945,  MRN: [id number]  Chief Complaint  Patient presents with  . Neuroma    Left foot neuroma.  Pt stated that her foot always has pain.     75 y.o. female presents with the above complaint. History confirmed with patient.  She is referred to me by Dr. Elisha Ponder.  She is had numerous injections without relief.  She is interested in permanent correction  Objective:  Physical Exam: warm, good capillary refill, no trophic changes or ulcerative lesions, normal DP and PT pulses and normal sensory exam. Left Foot: Sharp pain on palpation with radiation into the toes on the second plantar interspace at the level of the metatarsophalangeal joint, there is no evidence of plantar plate injury or instability of the adjacent joints . Radiographs: X-ray of the left foot: Reviewed previous left foot radiographs, she has what appears to be a previous osteotomy of the calcaneus, pes planus, no evidence of fracture or joint abnormality at the level of pain that she has at the second and third metatarsophalangeal joints Assessment:   1. Plantar neuroma of left foot   2. Pain in left foot      Plan:  Patient was evaluated and treated and all questions answered.   Discussed etiology, treatment options, and pathomechanics of plantar digital neuromas with the patient in detail.  We discussed treatment options including injections, offloading pads, orthotics and permanent surgical correction.  She has tried nonsurgical treatment so far without relief.  She is interested in excision of the neuroma.  I would like to order MRI to rule out any adjacent pathology and confirm the diagnosis.  Once this is complete she will return to me and proceed with surgical planning as soon as possible.  Return in about 1 month (around 08/11/2020) for after MRI to review.

## 2020-07-22 ENCOUNTER — Telehealth: Payer: Self-pay | Admitting: Nurse Practitioner

## 2020-07-22 NOTE — Telephone Encounter (Signed)
Contacted patient about moved appointment time per provider template. Patient is aware.

## 2020-07-30 ENCOUNTER — Ambulatory Visit
Admission: RE | Admit: 2020-07-30 | Discharge: 2020-07-30 | Disposition: A | Discharge status: Home / Self Care | Payer: 59 | Source: Ambulatory Visit | Attending: Podiatry | Admitting: Podiatry

## 2020-07-30 DIAGNOSIS — M71572 Other bursitis, not elsewhere classified, left ankle and foot: Secondary | ICD-10-CM | POA: Diagnosis not present

## 2020-07-30 DIAGNOSIS — M19072 Primary osteoarthritis, left ankle and foot: Secondary | ICD-10-CM | POA: Diagnosis not present

## 2020-07-30 DIAGNOSIS — M65872 Other synovitis and tenosynovitis, left ankle and foot: Secondary | ICD-10-CM | POA: Diagnosis not present

## 2020-07-30 DIAGNOSIS — M2012 Hallux valgus (acquired), left foot: Secondary | ICD-10-CM | POA: Diagnosis not present

## 2020-07-30 DIAGNOSIS — M79672 Pain in left foot: Secondary | ICD-10-CM

## 2020-07-30 DIAGNOSIS — G5762 Lesion of plantar nerve, left lower limb: Secondary | ICD-10-CM

## 2020-08-11 ENCOUNTER — Ambulatory Visit (INDEPENDENT_AMBULATORY_CARE_PROVIDER_SITE_OTHER): Payer: Medicare Other | Admitting: Podiatry

## 2020-08-11 ENCOUNTER — Other Ambulatory Visit: Payer: Self-pay

## 2020-08-11 ENCOUNTER — Encounter: Payer: Self-pay | Admitting: Podiatry

## 2020-08-11 DIAGNOSIS — G5762 Lesion of plantar nerve, left lower limb: Secondary | ICD-10-CM

## 2020-08-11 MED ORDER — TRIAMCINOLONE ACETONIDE 40 MG/ML IJ SUSP
10.0000 mg | Freq: Once | INTRAMUSCULAR | Status: AC
  Administered 2020-08-11: 10 mg

## 2020-08-11 MED ORDER — DEXAMETHASONE SODIUM PHOSPHATE 4 MG/ML IJ SOLN
4.0000 mg | Freq: Once | INTRAMUSCULAR | Status: AC
  Administered 2020-08-11: 4 mg

## 2020-08-11 NOTE — Progress Notes (Signed)
  Subjective:  Patient ID: Evelyn Orr, female    DOB: 03-05-1946,  MRN: [id number]  Chief Complaint  Patient presents with  . Foot Pain    MRI results     75 y.o. female returns with the above complaint. History confirmed with patient.  She completed the MRI.  Objective:  Physical Exam: warm, good capillary refill, no trophic changes or ulcerative lesions, normal DP and PT pulses and normal sensory exam. Left Foot: Sharp pain on palpation with radiation into the toes under the second toe, this is now more in the first interspace   MRI results: Neuroma of first interspace . Radiographs: X-ray of the left foot: Reviewed previous left foot radiographs, she has what appears to be a previous osteotomy of the calcaneus, pes planus, no evidence of fracture or joint abnormality at the level of pain that she has at the second and third metatarsophalangeal joints Assessment:   1. Plantar neuroma of left foot      Plan:  Patient was evaluated and treated and all questions answered.   Discussed further treatment and reviewed the MRI results with her.  We discussed that the MRI is detecting the neuroma in the first interspace.  Her previous injections were in the second interspace.  On palpation the pain in the plantar foot is right under the second metatarsal head, this is more medial with interspace pressure in the first more than the second today.  Its possible that previously the symptoms were more in the second interspace from the digital branch and the injection was not quite in the right area.  I recommended further injection today for both diagnostic and therapeutic purposes.  If it improves her symptoms significantly and eliminates them then she can avoid surgery, if it improves her pain but does not eliminated then we will plan for excision of the nerve in the first interspace.  Following sterile prep with alcohol, 1 cc of lidocaine 2%, 10 mg of Kenalog and 4 mg of dexamethasone  phosphate was injected in the first interspace plantarly at the level of the nerve.  She tolerated it well.  Return in about 3 weeks (around 09/01/2020) for check on neuroma.

## 2020-08-12 DIAGNOSIS — H0520 Unspecified exophthalmos: Secondary | ICD-10-CM | POA: Diagnosis not present

## 2020-08-12 DIAGNOSIS — H40023 Open angle with borderline findings, high risk, bilateral: Secondary | ICD-10-CM | POA: Diagnosis not present

## 2020-08-12 DIAGNOSIS — H04123 Dry eye syndrome of bilateral lacrimal glands: Secondary | ICD-10-CM | POA: Diagnosis not present

## 2020-08-12 DIAGNOSIS — H524 Presbyopia: Secondary | ICD-10-CM | POA: Diagnosis not present

## 2020-08-12 DIAGNOSIS — E0500 Thyrotoxicosis with diffuse goiter without thyrotoxic crisis or storm: Secondary | ICD-10-CM | POA: Diagnosis not present

## 2020-08-15 ENCOUNTER — Other Ambulatory Visit: Payer: Self-pay | Admitting: Internal Medicine

## 2020-08-29 ENCOUNTER — Other Ambulatory Visit: Payer: Self-pay | Admitting: Internal Medicine

## 2020-09-01 ENCOUNTER — Other Ambulatory Visit: Payer: Self-pay

## 2020-09-01 ENCOUNTER — Ambulatory Visit (INDEPENDENT_AMBULATORY_CARE_PROVIDER_SITE_OTHER): Payer: Medicare Other | Admitting: Podiatry

## 2020-09-01 DIAGNOSIS — G5762 Lesion of plantar nerve, left lower limb: Secondary | ICD-10-CM | POA: Diagnosis not present

## 2020-09-01 MED ORDER — DEXAMETHASONE SODIUM PHOSPHATE 4 MG/ML IJ SOLN
4.0000 mg | Freq: Once | INTRAMUSCULAR | Status: AC
Start: 2020-09-01 — End: 2020-09-01
  Administered 2020-09-01: 4 mg

## 2020-09-01 MED ORDER — TRIAMCINOLONE ACETONIDE 40 MG/ML IJ SUSP
10.0000 mg | Freq: Once | INTRAMUSCULAR | Status: AC
  Administered 2020-09-01: 10 mg

## 2020-09-01 NOTE — Progress Notes (Signed)
  Subjective:  Patient ID: Evelyn Orr, female    DOB: 06/07/1945,  MRN: [id number]  Chief Complaint  Patient presents with  . Neuroma    neuroma check    75 y.o. female returns with the above complaint. History confirmed with patient.  Says she is doing about 80% better after the last injection  Objective:  Physical Exam: warm, good capillary refill, no trophic changes or ulcerative lesions, normal DP and PT pulses and normal sensory exam. Left Foot: Sharp pain on palpation with radiation into the toes under the second toe, this is now more in the first interspace   MRI results: Neuroma of first interspace . Radiographs: X-ray of the left foot: Reviewed previous left foot radiographs, she has what appears to be a previous osteotomy of the calcaneus, pes planus, no evidence of fracture or joint abnormality at the level of pain that she has at the second and third metatarsophalangeal joints Assessment:   1. Plantar neuroma of left foot      Plan:  Patient was evaluated and treated and all questions answered.   She is doing well after last injection.  I discussed with her that one more injection would hopefully knock this out. Following sterile prep with alcohol, 1 cc of Marcaine 0.5%, 10 mg of Kenalog and 4 mg of dexamethasone phosphate was injected in the first interspace plantarly at the level of the nerve.  She tolerated it well.  Return in about 6 weeks (around 10/13/2020) for neuroma left foot, follow up on injection .

## 2020-09-03 ENCOUNTER — Other Ambulatory Visit: Payer: Self-pay | Admitting: Internal Medicine

## 2020-09-04 ENCOUNTER — Other Ambulatory Visit: Payer: Self-pay

## 2020-09-04 MED ORDER — BENZONATATE 100 MG PO CAPS: 100.0000 mg | ORAL_CAPSULE | Freq: Two times a day (BID) | ORAL | 1 refills | Status: DC | PRN

## 2020-10-06 ENCOUNTER — Encounter: Payer: Self-pay | Admitting: Podiatry

## 2020-10-06 ENCOUNTER — Ambulatory Visit (INDEPENDENT_AMBULATORY_CARE_PROVIDER_SITE_OTHER): Payer: Medicare Other | Admitting: Podiatry

## 2020-10-06 ENCOUNTER — Other Ambulatory Visit: Payer: Self-pay

## 2020-10-06 DIAGNOSIS — M79672 Pain in left foot: Secondary | ICD-10-CM | POA: Diagnosis not present

## 2020-10-06 DIAGNOSIS — M79675 Pain in left toe(s): Secondary | ICD-10-CM

## 2020-10-06 DIAGNOSIS — L84 Corns and callosities: Secondary | ICD-10-CM | POA: Diagnosis not present

## 2020-10-06 DIAGNOSIS — B351 Tinea unguium: Secondary | ICD-10-CM

## 2020-10-06 DIAGNOSIS — M79674 Pain in right toe(s): Secondary | ICD-10-CM

## 2020-10-06 NOTE — Progress Notes (Addendum)
Subjective: Evelyn Orr is a pleasant 75 y.o. female patient seen today painful thick toenails that are difficult to trim. Pain interferes with ambulation. Aggravating factors include wearing enclosed shoe gear. Pain is relieved with periodic professional debridement. Patient also has chronic painful plantar callus on the bottom of her left foot which responds to periodic professional debridement.  She has been seeing Dr. Sherryle Lis for management of painful neuroma of left foot which was confirmed with MRI.  PCP is Dr. [name] and last visit was 04/23/2020.  No Known Allergies  Objective: Physical Exam  General: Evelyn Orr is a pleasant 75 y.o. African American female, WD, WN in NAD. AAO x 3.   Vascular:  Capillary refill time to digits immediate b/l. Palpable pedal pulses b/l LE. Pedal hair sparse b/l lower extremities. Lower extremity skin temperature gradient within normal limits. No pain with calf compression b/l. No edema noted b/l lower extremities.   Dermatological:  Pedal skin with normal turgor, texture and tone bilaterally. No open wounds bilaterally. No interdigital macerations bilaterally. Toenails 1-5 b/l elongated, discolored, dystrophic, thickened, crumbly with subungual debris and tenderness to dorsal palpation. Hyperkeratotic lesion submet head 5 left foot with tenderness to palpation. No edema, no erythema, no drainage, no flocculence.  Musculoskeletal:  Normal muscle strength 5/5 to all lower extremity muscle groups bilaterally. No pain crepitus or joint limitation noted with ROM b/l. No gross bony deformities bilaterally.  Neurological:  Protective sensation intact 5/5 intact bilaterally with 10g monofilament b/l. Vibratory sensation intact b/l. Clonus negative b/l.  Assessment and Plan:  1. Pain due to onychomycosis of toenails of both feet   2. Callus   3. Left foot pain      -Examined patient. -Patient to continue soft, supportive shoe  gear daily. -Toenails 1-5 b/l were debrided in length and girth with sterile nail nippers and dremel without iatrogenic bleeding.  -Callus(es) submet head 5 left foot pared utilizing sterile scalpel blade without complication or incident. Total number debrided =1. -Patient to report any pedal injuries to medical professional immediately. -Patient/POA to call should there be question/concern in the interim.  Return in about 3 months (around 01/06/2021).  Marzetta Board, DPM

## 2020-10-09 ENCOUNTER — Other Ambulatory Visit: Payer: Self-pay | Admitting: Internal Medicine

## 2020-10-09 MED ORDER — MONTELUKAST SODIUM 10 MG PO TABS: 10.0000 mg | ORAL_TABLET | Freq: Every day | ORAL | 2 refills | Status: DC

## 2020-10-14 ENCOUNTER — Ambulatory Visit: Payer: 59 | Admitting: Podiatry

## 2020-10-18 DIAGNOSIS — Z20822 Contact with and (suspected) exposure to covid-19: Secondary | ICD-10-CM | POA: Diagnosis not present

## 2020-10-25 DIAGNOSIS — Z20822 Contact with and (suspected) exposure to covid-19: Secondary | ICD-10-CM | POA: Diagnosis not present

## 2020-10-29 DIAGNOSIS — B354 Tinea corporis: Secondary | ICD-10-CM | POA: Diagnosis not present

## 2020-10-31 DIAGNOSIS — Z20822 Contact with and (suspected) exposure to covid-19: Secondary | ICD-10-CM | POA: Diagnosis not present

## 2020-11-06 ENCOUNTER — Ambulatory Visit (INDEPENDENT_AMBULATORY_CARE_PROVIDER_SITE_OTHER): Payer: Medicare Other | Admitting: Podiatry

## 2020-11-06 ENCOUNTER — Encounter: Payer: Self-pay | Admitting: Podiatry

## 2020-11-06 ENCOUNTER — Other Ambulatory Visit: Payer: Self-pay

## 2020-11-06 DIAGNOSIS — G5762 Lesion of plantar nerve, left lower limb: Secondary | ICD-10-CM

## 2020-11-06 DIAGNOSIS — M7741 Metatarsalgia, right foot: Secondary | ICD-10-CM | POA: Diagnosis not present

## 2020-11-06 DIAGNOSIS — M7742 Metatarsalgia, left foot: Secondary | ICD-10-CM | POA: Diagnosis not present

## 2020-11-06 DIAGNOSIS — M069 Rheumatoid arthritis, unspecified: Secondary | ICD-10-CM | POA: Diagnosis not present

## 2020-11-06 MED ORDER — PREDNISONE 5 MG (21) PO TBPK: ORAL_TABLET | ORAL | 1 refills | Status: AC

## 2020-11-07 DIAGNOSIS — Z20822 Contact with and (suspected) exposure to covid-19: Secondary | ICD-10-CM | POA: Diagnosis not present

## 2020-11-09 NOTE — Progress Notes (Signed)
  Subjective:  Patient ID: Evelyn Orr, female    DOB: Jul 20, 1945,  MRN: [id number]  Chief Complaint  Patient presents with   Neuroma      neuroma left foot, follow up on injection     75 y.o. female returns with the above complaint. History confirmed with patient.  Still has a pain in the plantar foot  Objective:  Physical Exam: warm, good capillary refill, no trophic changes or ulcerative lesions, normal DP and PT pulses and normal sensory exam. Left Foot: Minimal to no pain on palpation today, she does still have some on the metatarsal phalangeal joint area bilaterally with left worse than right   MRI results: Neuroma of first interspace . Radiographs: X-ray of the left foot: Reviewed previous left foot radiographs, she has what appears to be a previous osteotomy of the calcaneus, pes planus, no evidence of fracture or joint abnormality at the level of pain that she has at the second and third metatarsophalangeal joints Assessment:   1. Rheumatoid arthritis involving both feet, unspecified whether rheumatoid factor present ([place])   2. Metatarsalgia of both feet   3. Plantar neuroma of left foot      Plan:  Patient was evaluated and treated and all questions answered.  Overall she has had improvement.  I think some of this may be from her rheumatoid arthritis.  She takes methotrexate for this.  I prescribed her a prednisone taper that should limit most of her pain.  Return as needed.  Return if symptoms worsen or fail to improve.

## 2020-11-14 DIAGNOSIS — Z20822 Contact with and (suspected) exposure to covid-19: Secondary | ICD-10-CM | POA: Diagnosis not present

## 2020-11-21 ENCOUNTER — Telehealth: Payer: Self-pay | Admitting: Hematology

## 2020-11-21 DIAGNOSIS — Z20822 Contact with and (suspected) exposure to covid-19: Secondary | ICD-10-CM | POA: Diagnosis not present

## 2020-11-21 NOTE — Telephone Encounter (Signed)
Rescheduled upcoming appointment due to scheduling conflict. Patient is aware of changes.

## 2020-11-29 DIAGNOSIS — Z20822 Contact with and (suspected) exposure to covid-19: Secondary | ICD-10-CM | POA: Diagnosis not present

## 2020-12-04 DIAGNOSIS — Z20822 Contact with and (suspected) exposure to covid-19: Secondary | ICD-10-CM | POA: Diagnosis not present

## 2020-12-08 ENCOUNTER — Ambulatory Visit: Payer: 59 | Admitting: Nurse Practitioner

## 2020-12-08 ENCOUNTER — Other Ambulatory Visit: Payer: 59

## 2020-12-13 DIAGNOSIS — Z20822 Contact with and (suspected) exposure to covid-19: Secondary | ICD-10-CM | POA: Diagnosis not present

## 2020-12-16 ENCOUNTER — Inpatient Hospital Stay: Payer: Medicare Other

## 2020-12-16 ENCOUNTER — Ambulatory Visit: Payer: 59 | Admitting: Nurse Practitioner

## 2020-12-19 DIAGNOSIS — Z20822 Contact with and (suspected) exposure to covid-19: Secondary | ICD-10-CM | POA: Diagnosis not present

## 2020-12-23 DIAGNOSIS — M8580 Other specified disorders of bone density and structure, unspecified site: Secondary | ICD-10-CM | POA: Diagnosis not present

## 2020-12-23 DIAGNOSIS — M79643 Pain in unspecified hand: Secondary | ICD-10-CM | POA: Diagnosis not present

## 2020-12-23 DIAGNOSIS — M0589 Other rheumatoid arthritis with rheumatoid factor of multiple sites: Secondary | ICD-10-CM | POA: Diagnosis not present

## 2020-12-23 DIAGNOSIS — M1990 Unspecified osteoarthritis, unspecified site: Secondary | ICD-10-CM | POA: Diagnosis not present

## 2020-12-23 DIAGNOSIS — Z79899 Other long term (current) drug therapy: Secondary | ICD-10-CM | POA: Diagnosis not present

## 2020-12-27 DIAGNOSIS — Z20822 Contact with and (suspected) exposure to covid-19: Secondary | ICD-10-CM | POA: Diagnosis not present

## 2021-01-02 DIAGNOSIS — Z20822 Contact with and (suspected) exposure to covid-19: Secondary | ICD-10-CM | POA: Diagnosis not present

## 2021-01-05 ENCOUNTER — Telehealth: Payer: Self-pay | Admitting: Nurse Practitioner

## 2021-01-05 NOTE — Telephone Encounter (Signed)
R/s appt due to Lacie being out of the Office. Pt is aware.

## 2021-01-06 ENCOUNTER — Inpatient Hospital Stay: Payer: Medicare Other | Admitting: Nurse Practitioner

## 2021-01-06 ENCOUNTER — Inpatient Hospital Stay: Payer: Medicare Other

## 2021-01-07 ENCOUNTER — Ambulatory Visit: Payer: Medicare Other | Admitting: Podiatry

## 2021-01-07 DIAGNOSIS — M67911 Unspecified disorder of synovium and tendon, right shoulder: Secondary | ICD-10-CM | POA: Diagnosis not present

## 2021-01-16 ENCOUNTER — Encounter: Payer: Self-pay | Admitting: Podiatry

## 2021-01-16 ENCOUNTER — Other Ambulatory Visit: Payer: Self-pay

## 2021-01-16 ENCOUNTER — Ambulatory Visit (INDEPENDENT_AMBULATORY_CARE_PROVIDER_SITE_OTHER): Payer: Medicare Other | Admitting: Podiatry

## 2021-01-16 DIAGNOSIS — M79672 Pain in left foot: Secondary | ICD-10-CM | POA: Diagnosis not present

## 2021-01-16 DIAGNOSIS — L84 Corns and callosities: Secondary | ICD-10-CM | POA: Diagnosis not present

## 2021-01-18 NOTE — Progress Notes (Signed)
Subjective: Evelyn Orr is a pleasant 75 y.o. female patient seen today for callus(es) plantar aspect of left foot. Aggravating factors include weightbearing with and without shoe gear. Pain is relieved with periodic professional debridement.   Evelyn Orr voices no new pedal problems on today's visit. She will be traveling to [place] to visit family.  PCP is [name], MD. Last visit was: 04/23/2020.Marland Kitchen  No Known Allergies  Objective: Physical Exam  General: Evelyn Orr is a pleasant 75 y.o. African American female, WD, WN in NAD. AAO x 3.   Vascular:  Capillary refill time to digits immediate b/l. Palpable DP pulse(s) b/l lower extremities Palpable PT pulse(s) b/l lower extremities Pedal hair sparse. Lower extremity skin temperature gradient within normal limits. No edema noted b/l lower extremities.  Dermatological:  Skin warm and supple b/l lower extremities. No open wounds b/l lower extremities. No interdigital macerations b/l lower extremities. Toenails 1-5 bilaterally well maintained with adequate length. No erythema, no edema, no drainage, no fluctuance. Hyperkeratotic lesion(s) submet head 5 left foot.  No erythema, no edema, no drainage, no fluctuance.  Musculoskeletal:  Normal muscle strength 5/5 to all lower extremity muscle groups bilaterally. No pain crepitus or joint limitation noted with ROM b/l lower extremities. No gross bony deformities b/l lower extremities.  Neurological:  Protective sensation intact 5/5 intact bilaterally with 10g monofilament b/l. Vibratory sensation intact b/l. Proprioception intact bilaterally.  Assessment and Plan:  1. Callus   2. Left foot pain   -Examined patient. -Patient to continue soft, supportive shoe gear daily. -Callus(es) submet head 5 left foot pared utilizing sterile scalpel blade without complication or incident. Total number debrided =1. -Patient to report any pedal injuries to medical professional  immediately. -Patient/POA to call should there be question/concern in the interim.  Return in about 3 months (around 04/18/2021).  Marzetta Board, DPM

## 2021-01-19 DIAGNOSIS — Z20822 Contact with and (suspected) exposure to covid-19: Secondary | ICD-10-CM | POA: Diagnosis not present

## 2021-01-20 ENCOUNTER — Other Ambulatory Visit: Payer: Medicare Other

## 2021-01-20 ENCOUNTER — Ambulatory Visit: Payer: Medicare Other | Admitting: Nurse Practitioner

## 2021-01-26 ENCOUNTER — Other Ambulatory Visit: Payer: Self-pay | Admitting: Internal Medicine

## 2021-02-02 NOTE — Progress Notes (Signed)
[place]   [phone] [phone]   Clinic Follow up Note   Patient Care Team: [name], MD as PCP - General (Internal Medicine) Earnstine Regal, PA-C as Physician Assistant (Obstetrics and Gynecology) Rolm Bookbinder, MD as Consulting Physician (General Surgery) Truitt Merle, MD as Consulting Physician (Hematology) Thea Silversmith, MD as Consulting Physician (Radiation Oncology) Mauro Kaufmann, RN as Registered Nurse Rockwell Germany, RN as Registered Nurse Sylvan Cheese, NP as Nurse Practitioner (Hematology and Oncology) 02/03/2021  CHIEF COMPLAINT: Follow up right breast cancer/DCIS  SUMMARY OF ONCOLOGIC HISTORY: Oncology History Overview Note  Breast cancer of upper-outer quadrant of right female breast [facility])   Staging form: Breast, AJCC 7th Edition     Clinical stage from 03/26/2015: Stage 0 (Tis (DCIS), N0, M0) - Unsigned     Breast cancer of upper-outer quadrant of right female breast ([place])  03/13/2015 Mammogram   7 mm oval mass in the right breast is suspicious for malignancy.   03/20/2015 Initial Biopsy   Breast, right, needle core biopsy DUCTAL CARCINOMA IN SITU IN PAPILLOMA, LOW GRADE   03/20/2015 Receptors her2   Estrogen Receptor: 95%, POSITIVE, STRONG STAINING INTENSITY Progesterone Receptor: 95%, POSITIVE, STRONG STAINING INTENSITY   03/20/2015 Clinical Stage   Stage 0: Tis N0   04/09/2015 Surgery   Right breast lumpectomy   04/09/2015 Pathology Results   Right breast lumpectomy showed low-grade DCIS, margins were negative. Fibrocystic changes with adenosis.   04/09/2015 Pathologic Stage   Stage 0: Tis N0   04/11/2015 Procedure   Breast/Ovarian panel (Gene[facility]) revealed variant of uncertain significance called "c.704C>A (p.Thr235Lys; otherwise negative at ATM, BARD1, BRCA1, BRCA2, BRIP1, CDH1, CHEK2, FANCC, MLH1, MSH2, MSH6, NBN, PALB2, PMS2, PTEN, RAD51C, RAD51D, TP53, and XRCC2.     06/20/2015 -  05/2020 Anti-estrogen oral therapy   anastrozole 1 mg once daily. Complete 5 year end of 05/2020   08/21/2015 Survivorship   Survivorship care plan completed and copy given to patient   03/23/2017 Mammogram   IMPRESSION: No evidence of malignancy     CURRENT THERAPY: Surveillance   INTERVAL HISTORY: Evelyn Orr returns for follow up as scheduled. She was last seen by Dr. Burr Medico 12/10/19. Mammogram 04/07/20 was negative. She completed AI at the end of 05/2020.  Hot flashes improved otherwise she cannot really tell a difference.  She is doing well in general, denies changes in her health.  RA is stable, denies any new bone/joint pain.  Denies concerns in her breast such as new lump/mass, nipple discharge or inversion, or skin change.  Denies recent fever, chills, cough, chest pain, dyspnea, bleeding, or new issues.   MEDICAL HISTORY:  Past Medical History:  Diagnosis Date   Arthritis    Breast cancer ([place])    Breast cancer of upper-outer quadrant of right female breast ([place]) 03/20/2015   Cough    Hypothyroidism    Nasal congestion    PONV (postoperative nausea and vomiting)    Rheumatoid arthritis(714.0)    Seasonal allergies    Thyroid disease    hypothyroidism    SURGICAL HISTORY: Past Surgical History:  Procedure Laterality Date   BREAST LUMPECTOMY WITH RADIOACTIVE SEED LOCALIZATION Right 04/09/2015   Procedure: BREAST LUMPECTOMY WITH RADIOACTIVE SEED LOCALIZATION;  Surgeon: Rolm Bookbinder, MD;  Location: [place];  Service: General;  Laterality: Right;   BREAST REDUCTION SURGERY  2001   CALCANEAL OSTEOTOMY Left 10/11/2013   Procedure: LEFT CALCANEAL OSTEOTOMY;  Surgeon: Wylene Simmer, MD;  Location:  [place];  Service: Orthopedics;  Laterality: Left;   CARPAL TUNNEL RELEASE  2006   rt   CARPAL TUNNEL RELEASE     left   CERVICAL FUSION  2009   COLONOSCOPY     2002,2005,2008 w/[facility]   GASTROCNEMIUS RECESSION Left 10/11/2013   Procedure:  LEFT GASTROC RECESSION; LEFT POSTERIOR TIBIAL TENOLYSIS;  Surgeon: Wylene Simmer, MD;  Location: [place];  Service: Orthopedics;  Laterality: Left;   KNEE ARTHROSCOPY  2004   right   POLYPECTOMY     REMOVAL OF IMPLANT Left 02/14/2014   Procedure: LEFT FOOT REMOVAL OF DEEP IMPLANT;  Surgeon: Wylene Simmer, MD;  Location: [place];  Service: Orthopedics;  Laterality: Left;   SHOULDER ARTHROSCOPY W/ ROTATOR CUFF REPAIR  2006   right   THYROIDECTOMY     age 75   TONSILLECTOMY     TOTAL KNEE ARTHROPLASTY Right 06/21/2016   Procedure: RIGHT TOTAL KNEE ARTHROPLASTY;  Surgeon: Paralee Cancel, MD;  Location: [facility];  Service: Orthopedics;  Laterality: Right;   TUBAL LIGATION     WRIST ARTHRODESIS  2003   left     I have reviewed the social history and family history with the patient and they are unchanged from previous note.  ALLERGIES:  has No Known Allergies.  MEDICATIONS:  Current Outpatient Medications  Medication Sig Dispense Refill   albuterol (VENTOLIN HFA) 108 (90 Base) MCG/ACT inhaler Inhale 2 puffs into the lungs every 6 (six) hours as needed for wheezing or shortness of breath. 18 g 1   Azelastine HCl 0.15 % SOLN 1 SPRAY EACH NOSTRIL DAILY AS NEEDED FOR NASAL CONGESTION  1   benzonatate (TESSALON) 100 MG capsule Take 1 capsule (100 mg total) by mouth 2 (two) times daily as needed for cough. 30 capsule 1   Betamethasone Sodium Phosphate 6 MG/ML SOLN inject 2 CC'S OF Betamethasone sodium into left BUTTOCK     clotrimazole-betamethasone (LOTRISONE) cream clotrimazole-betamethasone 1 %-0.05 % topical cream  APPLY TO AFFECTED AND SURROUNDING AREAS OF SKIN 2 TIMES A DAY, MORNING AND EVENING FOR 2 WEEKS     COVID-19 Specimen Collection KIT See admin instructions. for testing     [employer] ANTI-FUNGAL 2 % powder Apply topically.     diclofenac (FLECTOR) 1.3 % PTCH Flector 1.3 % transdermal 12 hour patch     folic acid (FOLVITE) 1 MG tablet Take 1 mg by mouth daily.      levothyroxine (SYNTHROID) 100 MCG tablet Take 100 mcg by mouth daily before breakfast. Monday - Friday     levothyroxine (SYNTHROID) 88 MCG tablet TAKE 1 TABLET BY MOUTH EVERY DAY (Patient taking differently: Saturday and Sunday) 90 tablet 2   methotrexate (RHEUMATREX) 2.5 MG tablet Take 2.5 mg by mouth once a week. 8 tablets weekly  1   miconazole (MICOTIN) 2 % powder Anti-Fungal 2 % topical powder     mometasone (NASONEX) 50 MCG/ACT nasal spray Place 2 sprays into the nose daily. 17 g 2   montelukast (SINGULAIR) 10 MG tablet TAKE 1 TABLET BY MOUTH EVERYDAY AT BEDTIME 30 tablet 2   nystatin ointment (MYCOSTATIN) APPLY TO THE AFFECTED AREA(S) BY TOPICAL ROUTE 2 TIMES PER DAY X 14 DAYS THEN AS NEEDED     potassium chloride SA (KLOR-CON) 20 MEQ tablet Klor-Con M20 mEq tablet,extended release     sulfaSALAzine (AZULFIDINE) 500 MG tablet Take 1,000 mg by mouth 2 (two) times daily.     triamcinolone (KENALOG) 0.1 %  triamcinolone acetonide 0.1 % topical cream     Vitamin D, Ergocalciferol, (DRISDOL) 1.25 MG (50000 UT) CAPS capsule TAKE 1 CAPSULE BY MOUTH 2 TIMES EVERY WEEK 8 capsule 0   amoxicillin (AMOXIL) 875 MG tablet amoxicillin 875 mg tablet (Patient not taking: Reported on 02/03/2021)     aspirin 325 MG EC tablet aspirin 325 mg tablet,delayed release  TAKE 1 TABLET (325 MG TOTAL) BY MOUTH DAILY. (Patient not taking: Reported on 02/03/2021)     potassium chloride SA (KLOR-CON) 20 MEQ tablet Take 1 tablet (20 mEq total) by mouth daily for 7 days. 7 tablet 0   No current facility-administered medications for this visit.    PHYSICAL EXAMINATION: ECOG PERFORMANCE STATUS: 0 - Asymptomatic  Vitals:   02/03/21 0908  BP: (!) 143/89  Pulse: 73  Resp: 17  Temp: 98.6 F (37 C)  SpO2: 100%   Filed Weights   02/03/21 0908  Weight: 158 lb 3.2 oz (71.8 kg)    GENERAL:alert, no distress and comfortable SKIN: No rash EYES:  sclera clear NECK: Without mass LYMPH:  no palpable cervical or  supraclavicular lymphadenopathy LUNGS: clear with normal breathing effort HEART: regular rate & rhythm, no lower extremity edema NEURO: alert & oriented x 3 with fluent speech, no focal motor/sensory deficits Breast exam: Breasts are symmetric without nipple discharge or inversion.  S/p right lumpectomy and bilateral reconstruction, incisions completely healed with mild scar tissue.  No palpable mass or nodularity in either breast or axilla that I could appreciate.  LABORATORY DATA:  I have reviewed the data as listed CBC Latest Ref Rng & Units 02/03/2021 12/10/2019 12/11/2018  WBC 4.0 - 10.5 K/uL 6.1 6.4 7.4  Hemoglobin 12.0 - 15.0 g/dL 12.1 12.4 13.0  Hematocrit 36.0 - 46.0 % 37.2 38.5 39.9  Platelets 150 - 400 K/uL 282 274 351     CMP Latest Ref Rng & Units 02/03/2021 04/23/2020 12/10/2019  Glucose 70 - 99 mg/dL 80 85 114(H)  BUN 8 - 23 mg/dL _0 Creatinine 0.44 - 1.00 mg/dL 0.76 0.71 0.78  Sodium 135 - 145 mmol/L 143 139 142  Potassium 3.5 - 5.1 mmol/L 3.5 4.0 3.2(L)  Chloride 98 - 111 mmol/L 104 101 106  CO2 22 - 32 mmol/L _1 Calcium 8.9 - 10.3 mg/dL 9.3 9.7 9.7  Total Protein 6.5 - 8.1 g/dL 7.7 - 8.0  Total Bilirubin 0.3 - 1.2 mg/dL 0.6 - 0.5  Alkaline Phos 38 - 126 U/L 85 - 109  AST 15 - 41 U/L 20 - 19  ALT 0 - 44 U/L 11 - 12      RADIOGRAPHIC STUDIES: I have personally reviewed the radiological images as listed and agreed with the findings in the report. No results found.   ASSESSMENT & PLAN: Evelyn Orr is a 75 y.o. female with    1. Right breast DCIS, low grade, ER and PR strongly positive -Diagnosed in 03/2015. S/p right lumpectomy and 5 years antiestrogen therapy (anastrozole) from 06/20/2015 - 06/19/2020.  She tolerated well.  Adjuvant radiation was not recommended.  -Mammogram 03/2020 NED   2. Osteopenia -Her 05/2015 DEXA showed osteopenia. Her DEXA from 05/30/17 and 05/2018 showed stable in AP spine (T-score -2.4), but decrease in b/l hips, she  remains osteopenic. -Continue calcium, vitamin D, and weightbearing exercise -Repeat DEXA 03/2021 with mammogram     3. Hyperthyroidism and rheumatoid arthritis -She is on weekly methotrexate, and her rheumatoid arthritis has been well-controlled. -stable.  -  Continue follow-up with rheumatology and PCP  Disposition: Evelyn Orr is clinically doing well.  She completed 5 years of AI in 05/2020, she tolerated well.  Breast exam is normal, labs WNL, mammogram 03/2020 NED.  Overall there is no clinical concern for recurrence.  She is over 5 years from DCIS diagnosis, she completed 5 years antiestrogen therapy.  The recurrence risk is minimal.  We will continue surveillance and screening for new cancers.  Continue healthy lifestyle, calcium and vitamin D, smoking abstinence, limit alcohol, and remain up-to-date on vaccines and age-appropriate cancer screenings.  I referred her back to [facility] for colonoscopy due 03/2021.  Next mammogram and DEXA due 03/2021 at [facility], orders placed.  I will call her with results.  Return for lab and routine surveillance visit in 1 year, or sooner if needed.  Orders Placed This Encounter  Procedures   MM 3D SCREEN BREAST BILATERAL    Standing Status:   Future    Standing Expiration Date:   02/03/2022    Scheduling Instructions:     [facility]    Order Specific Question:   Reason for Exam (SYMPTOM  OR DIAGNOSIS REQUIRED)    Answer:   h/o R breast DCIS 2018    Order Specific Question:   Preferred imaging location?    Answer:   External   DG Bone Density    Standing Status:   Future    Standing Expiration Date:   02/03/2022    Scheduling Instructions:     [facility]    Order Specific Question:   Reason for Exam (SYMPTOM  OR DIAGNOSIS REQUIRED)    Answer:   osteopenia, on AI until 05/2020    Order Specific Question:   Preferred imaging location?    Answer:   External   Ambulatory referral to Gastroenterology    Referral Priority:   Routine    Referral Type:    Consultation    Referral Reason:   Specialty Services Required    Number of Visits Requested:   1   All questions were answered. The patient knows to call the clinic with any problems, questions or concerns. No barriers to learning were detected.     Alla Feeling, NP 02/03/21

## 2021-02-03 ENCOUNTER — Encounter: Payer: Self-pay | Admitting: Nurse Practitioner

## 2021-02-03 ENCOUNTER — Other Ambulatory Visit: Payer: Self-pay

## 2021-02-03 ENCOUNTER — Inpatient Hospital Stay (HOSPITAL_BASED_OUTPATIENT_CLINIC_OR_DEPARTMENT_OTHER): Payer: Medicare Other | Admitting: Nurse Practitioner

## 2021-02-03 ENCOUNTER — Inpatient Hospital Stay: Payer: Medicare Other | Attending: Nurse Practitioner

## 2021-02-03 VITALS — BP 143/89 | HR 73 | Temp 98.6°F | Resp 17 | Wt 158.2 lb

## 2021-02-03 DIAGNOSIS — R232 Flushing: Secondary | ICD-10-CM | POA: Diagnosis not present

## 2021-02-03 DIAGNOSIS — Z170 Estrogen receptor positive status [ER+]: Secondary | ICD-10-CM

## 2021-02-03 DIAGNOSIS — Z79899 Other long term (current) drug therapy: Secondary | ICD-10-CM | POA: Diagnosis not present

## 2021-02-03 DIAGNOSIS — C50411 Malignant neoplasm of upper-outer quadrant of right female breast: Secondary | ICD-10-CM | POA: Insufficient documentation

## 2021-02-03 DIAGNOSIS — M069 Rheumatoid arthritis, unspecified: Secondary | ICD-10-CM | POA: Diagnosis not present

## 2021-02-03 DIAGNOSIS — Z1211 Encounter for screening for malignant neoplasm of colon: Secondary | ICD-10-CM | POA: Diagnosis not present

## 2021-02-03 DIAGNOSIS — E0590 Thyrotoxicosis, unspecified without thyrotoxic crisis or storm: Secondary | ICD-10-CM | POA: Diagnosis not present

## 2021-02-03 DIAGNOSIS — M8589 Other specified disorders of bone density and structure, multiple sites: Secondary | ICD-10-CM | POA: Insufficient documentation

## 2021-02-03 LAB — CBC WITH DIFFERENTIAL/PLATELET
Abs Immature Granulocytes: 0.01 10*3/uL (ref 0.00–0.07)
Basophils Absolute: 0 10*3/uL (ref 0.0–0.1)
Basophils Relative: 1 %
Eosinophils Absolute: 0.2 10*3/uL (ref 0.0–0.5)
Eosinophils Relative: 3 %
HCT: 37.2 % (ref 36.0–46.0)
Hemoglobin: 12.1 g/dL (ref 12.0–15.0)
Immature Granulocytes: 0 %
Lymphocytes Relative: 47 %
Lymphs Abs: 2.9 10*3/uL (ref 0.7–4.0)
MCH: 30.9 pg (ref 26.0–34.0)
MCHC: 32.5 g/dL (ref 30.0–36.0)
MCV: 94.9 fL (ref 80.0–100.0)
Monocytes Absolute: 0.3 10*3/uL (ref 0.1–1.0)
Monocytes Relative: 4 %
Neutro Abs: 2.8 10*3/uL (ref 1.7–7.7)
Neutrophils Relative %: 45 %
Platelets: 282 10*3/uL (ref 150–400)
RBC: 3.92 MIL/uL (ref 3.87–5.11)
RDW: 14.2 % (ref 11.5–15.5)
WBC: 6.1 10*3/uL (ref 4.0–10.5)
nRBC: 0 % (ref 0.0–0.2)

## 2021-02-03 LAB — COMPREHENSIVE METABOLIC PANEL
ALT: 11 U/L (ref 0–44)
AST: 20 U/L (ref 15–41)
Albumin: 3.6 g/dL (ref 3.5–5.0)
Alkaline Phosphatase: 85 U/L (ref 38–126)
Anion gap: 10 (ref 5–15)
BUN: 10 mg/dL (ref 8–23)
CO2: 29 mmol/L (ref 22–32)
Calcium: 9.3 mg/dL (ref 8.9–10.3)
Chloride: 104 mmol/L (ref 98–111)
Creatinine, Ser: 0.76 mg/dL (ref 0.44–1.00)
GFR, Estimated: >60 mL/min (ref >60)
Glucose, Bld: 80 mg/dL (ref 70–99)
Potassium: 3.5 mmol/L (ref 3.5–5.1)
Sodium: 143 mmol/L (ref 135–145)
Total Bilirubin: 0.6 mg/dL (ref 0.3–1.2)
Total Protein: 7.7 g/dL (ref 6.5–8.1)

## 2021-02-04 ENCOUNTER — Telehealth: Payer: Self-pay | Admitting: Nurse Practitioner

## 2021-02-04 NOTE — Telephone Encounter (Signed)
Scheduled follow-up appointment per 9/6 los Patient is aware. Mailed calendar. 

## 2021-02-10 DIAGNOSIS — H0520 Unspecified exophthalmos: Secondary | ICD-10-CM | POA: Diagnosis not present

## 2021-02-10 DIAGNOSIS — H40023 Open angle with borderline findings, high risk, bilateral: Secondary | ICD-10-CM | POA: Diagnosis not present

## 2021-02-10 DIAGNOSIS — H04123 Dry eye syndrome of bilateral lacrimal glands: Secondary | ICD-10-CM | POA: Diagnosis not present

## 2021-02-10 DIAGNOSIS — E0500 Thyrotoxicosis with diffuse goiter without thyrotoxic crisis or storm: Secondary | ICD-10-CM | POA: Diagnosis not present

## 2021-02-16 ENCOUNTER — Encounter: Payer: Self-pay | Admitting: Gastroenterology

## 2021-02-18 ENCOUNTER — Other Ambulatory Visit: Payer: Self-pay

## 2021-02-24 DIAGNOSIS — M25511 Pain in right shoulder: Secondary | ICD-10-CM | POA: Diagnosis not present

## 2021-02-27 ENCOUNTER — Other Ambulatory Visit: Payer: Self-pay | Admitting: Orthopedic Surgery

## 2021-02-27 DIAGNOSIS — Z01811 Encounter for preprocedural respiratory examination: Secondary | ICD-10-CM

## 2021-02-27 DIAGNOSIS — M19011 Primary osteoarthritis, right shoulder: Secondary | ICD-10-CM | POA: Diagnosis not present

## 2021-03-03 ENCOUNTER — Encounter (HOSPITAL_COMMUNITY): Payer: Self-pay | Admitting: Orthopedic Surgery

## 2021-03-03 ENCOUNTER — Other Ambulatory Visit: Payer: Self-pay

## 2021-03-03 NOTE — Progress Notes (Signed)
For Short Stay: [place] appointment date: N/A Date of COVID positive in last 90 days: no   For Anesthesia: PCP - [name], MD last office visit 04/27/20 in epic Cardiologist - N/A  Chest x-ray - 03/04/21 in epic EKG - 03/04/21 in epic Stress Test - greater than 2 years  ECHO - N/A Cardiac Cath - N/A Pacemaker/ICD device last checked:N/A  Sleep Study - N/A CPAP - N/A  Fasting Blood Sugar - N/A Checks Blood Sugar _N/A____ times a day  Blood Thinner Instructions: N/A Aspirin Instructions: N/A Last Dose:N/A  Activity level:  Able to exercise without chest pain and/or shortness of breath       Anesthesia review:   Patient denies shortness of breath, fever, cough and chest pain at PAT appointment   Patient verbalized understanding of instructions that were given to them at the PAT appointment. Patient was also instructed that they will need to review over the PAT instructions again at home before surgery.

## 2021-03-03 NOTE — Patient Instructions (Signed)
DUE TO COVID-19 ONLY ONE VISITOR IS ALLOWED TO COME WITH YOU AND STAY IN THE WAITING ROOM ONLY DURING PRE OP AND PROCEDURE.   **NO VISITORS ARE ALLOWED IN THE SHORT STAY AREA OR RECOVERY ROOM!!**       Your procedure is scheduled on: Thursday, Oct.6, 2022   Report to [facility] Main Entrance    Report to admitting at 10:00 AM   Call this number if you have problems the morning of surgery [phone]   Do not eat food :After Midnight.   May have liquids until  9:45 AM  day of surgery  CLEAR LIQUID DIET  Foods Allowed                                                                     Foods Excluded  Water, Black Coffee and tea, regular and decaf                             liquids that you cannot  Plain Jell-O in any flavor  (No red)                                           see through such as: Fruit ices (not with fruit pulp)                                     milk, soups, orange juice              Iced Popsicles (No red)                                    All solid food                                   Apple juices Sports drinks like Gatorade (No red) Lightly seasoned clear broth or consume(fat free) Sugar, honey syrup  Sample Menu Breakfast                                Lunch                                     Supper Cranberry juice                    Beef broth                            Chicken broth Jell-O                                     Grape juice  Apple juice Coffee or tea                        Jell-O                                      Popsicle                                                Coffee or tea                        Coffee or tea         The day of surgery:  Drink ONE (1) Pre-Surgery Clear Ensure at 9:45 AM the morning of surgery. Drink in one sitting. Do not sip.  This drink was given to you during your hospital  pre-op appointment visit. Nothing else to drink after completing the  Pre-Surgery Clear  Ensure.          If you have questions, please contact your surgeon's office.     Oral Hygiene is also important to reduce your risk of infection.                                    Remember - BRUSH YOUR TEETH THE MORNING OF SURGERY WITH YOUR REGULAR TOOTHPASTE   Do NOT smoke after Midnight   Take these medicines the morning of surgery with A SIP OF WATER: Levothyroxine, Sulfasalazine                              You may not have any metal on your body including hair pins, jewelry, and body piercing             Do not wear make-up, lotions, powders, perfumes/cologne, or deodorant  Do not wear nail polish including gel and S&S, artificial/acrylic nails, or any other type of covering on natural nails including finger and toenails. If you have artificial nails, gel coating, etc. that needs to be removed by a nail salon please have this removed prior to surgery or surgery may need to be canceled/ delayed if the surgeon/ anesthesia feels like they are unable to be safely monitored.   Do not shave  48 hours prior to surgery.    Do not bring valuables to the hospital. [place].   Contacts, dentures or bridgework may not be worn into surgery.    Patients discharged on the day of surgery will not be allowed to drive home.   Special Instructions: Bring a copy of your healthcare power of attorney and living will documents         the day of surgery if you haven't scanned them before.              Please read over the following fact sheets you were given: IF YOU HAVE QUESTIONS ABOUT YOUR PRE-OP INSTRUCTIONS PLEASE CALL [place]- Preparing for Total Shoulder Arthroplasty    Before surgery, you can play an  important role. Because skin is not sterile, your skin needs to be as free of germs as possible. You can reduce the number of germs on your skin by using the following products. Benzoyl Peroxide Gel Reduces the number of germs  present on the skin Applied twice a day to shoulder area starting two days before surgery    ==================================================================  Please follow these instructions carefully:  BENZOYL PEROXIDE 5% GEL  Please do not use if you have an allergy to benzoyl peroxide.   If your skin becomes reddened/irritated stop using the benzoyl peroxide.  Starting Today, apply as follows: Apply benzoyl peroxide in the when you return home and at night. Apply after taking a shower. If you are not taking a shower clean entire shoulder front, back, and side along with the armpit with a clean wet washcloth.  Place a quarter-sized dollop on your shoulder and rub in thoroughly, making sure to cover the front, back, and side of your shoulder, along with the armpit.                1 day before ____ AM   ____ PM                         Do this twice today.  (Last application is the night before surgery, AFTER using the CHG soap as described below).  Do NOT apply benzoyl peroxide gel on the day of surgery.    [place] - Preparing for Surgery Before surgery, you can play an important role.  Because skin is not sterile, your skin needs to be as free of germs as possible.  You can reduce the number of germs on your skin by washing with CHG (chlorahexidine gluconate) soap before surgery.  CHG is an antiseptic cleaner which kills germs and bonds with the skin to continue killing germs even after washing. Please DO NOT use if you have an allergy to CHG or antibacterial soaps.  If your skin becomes reddened/irritated stop using the CHG and inform your nurse when you arrive at Short Stay. Do not shave (including legs and underarms) for at least 48 hours prior to the first CHG shower.  You may shave your face/neck.  Please follow these instructions carefully:  1.  Shower with CHG Soap the night before surgery and the  morning of surgery.  2.  If you choose to wash your hair, wash your hair  first as usual with your normal  shampoo.  3.  After you shampoo, rinse your hair and body thoroughly to remove the shampoo.                             4.  Use CHG as you would any other liquid soap.  You can apply chg directly to the skin and wash.  Gently with a scrungie or clean washcloth.  5.  Apply the CHG Soap to your body ONLY FROM THE NECK DOWN.   Do   not use on face/ open                           Wound or open sores. Avoid contact with eyes, ears mouth and   genitals (private parts).                       Wash face,  Genitals (private parts) with  your normal soap.             6.  Wash thoroughly, paying special attention to the area where your    surgery  will be performed.  7.  Thoroughly rinse your body with warm water from the neck down.  8.  DO NOT shower/wash with your normal soap after using and rinsing off the CHG Soap.                9.  Pat yourself dry with a clean towel.            10.  Wear clean pajamas.            11.  Place clean sheets on your bed the night of your first shower and do not  sleep with pets. Day of Surgery : Do not apply any lotions/deodorants the morning of surgery.  Please wear clean clothes to the hospital/surgery center.  FAILURE TO FOLLOW THESE INSTRUCTIONS MAY RESULT IN THE CANCELLATION OF YOUR SURGERY  PATIENT SIGNATURE_________________________________  NURSE SIGNATURE__________________________________  ________________________________________________________________________   WHAT IS A BLOOD TRANSFUSION? Blood Transfusion Information  A transfusion is the replacement of blood or some of its parts. Blood is made up of multiple cells which provide different functions. Red blood cells carry oxygen and are used for blood loss replacement. White blood cells fight against infection. Platelets control bleeding. Plasma helps clot blood. Other blood products are available for specialized needs, such as hemophilia or other clotting  disorders. BEFORE THE TRANSFUSION  Who gives blood for transfusions?  Healthy volunteers who are fully evaluated to make sure their blood is safe. This is blood bank blood. Transfusion therapy is the safest it has ever been in the practice of medicine. Before blood is taken from a donor, a complete history is taken to make sure that person has no history of diseases nor engages in risky social behavior (examples are intravenous drug use or sexual activity with multiple partners). The donor's travel history is screened to minimize risk of transmitting infections, such as malaria. The donated blood is tested for signs of infectious diseases, such as HIV and hepatitis. The blood is then tested to be sure it is compatible with you in order to minimize the chance of a transfusion reaction. If you or a relative donates blood, this is often done in anticipation of surgery and is not appropriate for emergency situations. It takes many days to process the donated blood. RISKS AND COMPLICATIONS Although transfusion therapy is very safe and saves many lives, the main dangers of transfusion include:  Getting an infectious disease. Developing a transfusion reaction. This is an allergic reaction to something in the blood you were given. Every precaution is taken to prevent this. The decision to have a blood transfusion has been considered carefully by your caregiver before blood is given. Blood is not given unless the benefits outweigh the risks. AFTER THE TRANSFUSION Right after receiving a blood transfusion, you will usually feel much better and more energetic. This is especially true if your red blood cells have gotten low (anemic). The transfusion raises the level of the red blood cells which carry oxygen, and this usually causes an energy increase. The nurse administering the transfusion will monitor you carefully for complications. HOME CARE INSTRUCTIONS  No special instructions are needed after a transfusion.  You may find your energy is better. Speak with your caregiver about any limitations on activity for underlying diseases you may have. SEEK MEDICAL CARE IF:  Your condition is not improving after your transfusion. You develop redness or irritation at the intravenous (IV) site. SEEK IMMEDIATE MEDICAL CARE IF:  Any of the following symptoms occur over the next 12 hours: Shaking chills. You have a temperature by mouth above 102 F (38.9 C), not controlled by medicine. Chest, back, or muscle pain. People around you feel you are not acting correctly or are confused. Shortness of breath or difficulty breathing. Dizziness and fainting. You get a rash or develop hives. You have a decrease in urine output. Your urine turns a dark color or changes to pink, red, or brown. Any of the following symptoms occur over the next 10 days: You have a temperature by mouth above 102 F (38.9 C), not controlled by medicine. Shortness of breath. Weakness after normal activity. The white part of the eye turns yellow (jaundice). You have a decrease in the amount of urine or are urinating less often. Your urine turns a dark color or changes to pink, red, or brown. Document Released: 05/14/2000 Document Revised: 08/09/2011 Document Reviewed: 01/01/2008 [facility] Patient Information 2014 [place], [place].  _______________________________________________________________________

## 2021-03-04 ENCOUNTER — Ambulatory Visit (HOSPITAL_COMMUNITY)
Admission: RE | Admit: 2021-03-04 | Discharge: 2021-03-04 | Disposition: A | Discharge status: Home / Self Care | Payer: Medicare Other | Source: Ambulatory Visit | Attending: Orthopedic Surgery | Admitting: Orthopedic Surgery

## 2021-03-04 ENCOUNTER — Encounter (HOSPITAL_COMMUNITY)
Admission: RE | Admit: 2021-03-04 | Discharge: 2021-03-04 | Disposition: A | Discharge status: Home / Self Care | Payer: Medicare Other | Source: Ambulatory Visit | Attending: Orthopedic Surgery | Admitting: Orthopedic Surgery

## 2021-03-04 DIAGNOSIS — Z01818 Encounter for other preprocedural examination: Secondary | ICD-10-CM | POA: Diagnosis not present

## 2021-03-04 DIAGNOSIS — Z01811 Encounter for preprocedural respiratory examination: Secondary | ICD-10-CM | POA: Insufficient documentation

## 2021-03-04 LAB — CBC WITH DIFFERENTIAL/PLATELET
Abs Immature Granulocytes: 0.01 10*3/uL (ref 0.00–0.07)
Basophils Absolute: 0 10*3/uL (ref 0.0–0.1)
Basophils Relative: 1 %
Eosinophils Absolute: 0.1 10*3/uL (ref 0.0–0.5)
Eosinophils Relative: 3 %
HCT: 39.5 % (ref 36.0–46.0)
Hemoglobin: 12.5 g/dL (ref 12.0–15.0)
Immature Granulocytes: 0 %
Lymphocytes Relative: 51 %
Lymphs Abs: 2.5 10*3/uL (ref 0.7–4.0)
MCH: 30.9 pg (ref 26.0–34.0)
MCHC: 31.6 g/dL (ref 30.0–36.0)
MCV: 97.8 fL (ref 80.0–100.0)
Monocytes Absolute: 0.3 10*3/uL (ref 0.1–1.0)
Monocytes Relative: 6 %
Neutro Abs: 1.9 10*3/uL (ref 1.7–7.7)
Neutrophils Relative %: 39 %
Platelets: 316 10*3/uL (ref 150–400)
RBC: 4.04 MIL/uL (ref 3.87–5.11)
RDW: 14.6 % (ref 11.5–15.5)
WBC: 4.9 10*3/uL (ref 4.0–10.5)
nRBC: 0 % (ref 0.0–0.2)

## 2021-03-04 LAB — COMPREHENSIVE METABOLIC PANEL
ALT: 13 U/L (ref 0–44)
AST: 30 U/L (ref 15–41)
Albumin: 3.9 g/dL (ref 3.5–5.0)
Alkaline Phosphatase: 105 U/L (ref 38–126)
Anion gap: 11 (ref 5–15)
BUN: 9 mg/dL (ref 8–23)
CO2: 27 mmol/L (ref 22–32)
Calcium: 9.4 mg/dL (ref 8.9–10.3)
Chloride: 106 mmol/L (ref 98–111)
Creatinine, Ser: 0.69 mg/dL (ref 0.44–1.00)
GFR, Estimated: >60 mL/min (ref >60)
Glucose, Bld: 82 mg/dL (ref 70–99)
Potassium: 3.7 mmol/L (ref 3.5–5.1)
Sodium: 144 mmol/L (ref 135–145)
Total Bilirubin: 0.5 mg/dL (ref 0.3–1.2)
Total Protein: 7.9 g/dL (ref 6.5–8.1)

## 2021-03-04 LAB — URINALYSIS, ROUTINE W REFLEX MICROSCOPIC
Bacteria, UA: NONE SEEN
Bilirubin Urine: NEGATIVE
Glucose, UA: NEGATIVE mg/dL
Ketones, ur: NEGATIVE mg/dL
Leukocytes,Ua: NEGATIVE
Nitrite: NEGATIVE
Protein, ur: NEGATIVE mg/dL
Specific Gravity, Urine: 1.002 — ABNORMAL LOW (ref 1.005–1.030)
pH: 7 (ref 5.0–8.0)

## 2021-03-04 LAB — SURGICAL PCR SCREEN
MRSA, PCR: NEGATIVE
Staphylococcus aureus: NEGATIVE

## 2021-03-05 ENCOUNTER — Encounter (HOSPITAL_COMMUNITY): Payer: Self-pay | Admitting: Orthopedic Surgery

## 2021-03-05 ENCOUNTER — Other Ambulatory Visit: Payer: Self-pay

## 2021-03-05 ENCOUNTER — Ambulatory Visit (HOSPITAL_COMMUNITY): Payer: Medicare Other | Admitting: Anesthesiology

## 2021-03-05 ENCOUNTER — Encounter (HOSPITAL_COMMUNITY)
Admission: RE | Disposition: A | Discharge status: Home / Self Care | Payer: Self-pay | Source: Home / Self Care | Attending: Orthopedic Surgery

## 2021-03-05 ENCOUNTER — Observation Stay (HOSPITAL_COMMUNITY): Payer: Medicare Other

## 2021-03-05 ENCOUNTER — Observation Stay (HOSPITAL_COMMUNITY)
Admission: RE | Admit: 2021-03-05 | Discharge: 2021-03-06 | Disposition: A | Discharge status: Home / Self Care | Payer: Medicare Other | Attending: Orthopedic Surgery | Admitting: Orthopedic Surgery

## 2021-03-05 DIAGNOSIS — E039 Hypothyroidism, unspecified: Secondary | ICD-10-CM | POA: Insufficient documentation

## 2021-03-05 DIAGNOSIS — M19011 Primary osteoarthritis, right shoulder: Principal | ICD-10-CM | POA: Insufficient documentation

## 2021-03-05 DIAGNOSIS — G8918 Other acute postprocedural pain: Secondary | ICD-10-CM | POA: Diagnosis not present

## 2021-03-05 DIAGNOSIS — Z96611 Presence of right artificial shoulder joint: Secondary | ICD-10-CM

## 2021-03-05 DIAGNOSIS — Z853 Personal history of malignant neoplasm of breast: Secondary | ICD-10-CM | POA: Insufficient documentation

## 2021-03-05 DIAGNOSIS — Z96651 Presence of right artificial knee joint: Secondary | ICD-10-CM | POA: Insufficient documentation

## 2021-03-05 DIAGNOSIS — Z87891 Personal history of nicotine dependence: Secondary | ICD-10-CM | POA: Insufficient documentation

## 2021-03-05 DIAGNOSIS — M75101 Unspecified rotator cuff tear or rupture of right shoulder, not specified as traumatic: Secondary | ICD-10-CM | POA: Diagnosis not present

## 2021-03-05 DIAGNOSIS — Z79899 Other long term (current) drug therapy: Secondary | ICD-10-CM | POA: Diagnosis not present

## 2021-03-05 DIAGNOSIS — Z471 Aftercare following joint replacement surgery: Secondary | ICD-10-CM | POA: Diagnosis not present

## 2021-03-05 HISTORY — PX: REVERSE SHOULDER ARTHROPLASTY: SHX5054

## 2021-03-05 LAB — TYPE AND SCREEN
ABO/RH(D): O POS
Antibody Screen: NEGATIVE

## 2021-03-05 SURGERY — ARTHROPLASTY, SHOULDER, TOTAL, REVERSE
Anesthesia: General | Site: Shoulder | Laterality: Right

## 2021-03-05 MED ORDER — MENTHOL 3 MG MT LOZG: 1.0000 | LOZENGE | OROMUCOSAL | Status: DC | PRN

## 2021-03-05 MED ORDER — LEVOTHYROXINE SODIUM 100 MCG PO TABS: 100.0000 ug | ORAL_TABLET | ORAL | Status: DC

## 2021-03-05 MED ORDER — BENZONATATE 100 MG PO CAPS: 100.0000 mg | ORAL_CAPSULE | Freq: Two times a day (BID) | ORAL | Status: DC | PRN

## 2021-03-05 MED ORDER — LACTATED RINGERS IV SOLN: INTRAVENOUS | Status: DC

## 2021-03-05 MED ORDER — METOCLOPRAMIDE HCL 5 MG/ML IJ SOLN: 5.0000 mg | Freq: Three times a day (TID) | INTRAMUSCULAR | Status: DC | PRN

## 2021-03-05 MED ORDER — DEXAMETHASONE SODIUM PHOSPHATE 10 MG/ML IJ SOLN
INTRAMUSCULAR | Status: AC
  Filled 2021-03-05: qty 1

## 2021-03-05 MED ORDER — PROPOFOL 10 MG/ML IV BOLUS
INTRAVENOUS | Status: DC | PRN
Start: 2021-03-05 — End: 2021-03-05
  Administered 2021-03-05: 140 mg via INTRAVENOUS
  Administered 2021-03-05: 40 mg via INTRAVENOUS

## 2021-03-05 MED ORDER — FENTANYL CITRATE PF 50 MCG/ML IJ SOSY
50.0000 ug | PREFILLED_SYRINGE | INTRAMUSCULAR | Status: AC
Start: 2021-03-05 — End: 2021-03-05
  Administered 2021-03-05: 100 ug via INTRAVENOUS
  Filled 2021-03-05: qty 2

## 2021-03-05 MED ORDER — ALBUMIN HUMAN 5 % IV SOLN: INTRAVENOUS | Status: DC | PRN

## 2021-03-05 MED ORDER — BUPIVACAINE LIPOSOME 1.3 % IJ SUSP
INTRAMUSCULAR | Status: DC | PRN
  Administered 2021-03-05: 10 mL via PERINEURAL

## 2021-03-05 MED ORDER — ONDANSETRON HCL 4 MG/2ML IJ SOLN: 4.0000 mg | Freq: Once | INTRAMUSCULAR | Status: AC | PRN

## 2021-03-05 MED ORDER — PHENOL 1.4 % MT LIQD: 1.0000 | OROMUCOSAL | Status: DC | PRN

## 2021-03-05 MED ORDER — BUPIVACAINE HCL (PF) 0.5 % IJ SOLN
INTRAMUSCULAR | Status: DC | PRN
  Administered 2021-03-05: 15 mL via PERINEURAL

## 2021-03-05 MED ORDER — METHOCARBAMOL 500 MG PO TABS: 500.0000 mg | ORAL_TABLET | Freq: Four times a day (QID) | ORAL | Status: DC | PRN

## 2021-03-05 MED ORDER — MEPERIDINE HCL 50 MG/ML IJ SOLN: 6.2500 mg | INTRAMUSCULAR | Status: DC | PRN

## 2021-03-05 MED ORDER — OXYCODONE HCL 5 MG PO TABS
5.0000 mg | ORAL_TABLET | Freq: Once | ORAL | Status: DC | PRN
Start: 2021-03-05 — End: 2021-03-05

## 2021-03-05 MED ORDER — FLEET ENEMA 7-19 GM/118ML RE ENEM: 1.0000 | ENEMA | Freq: Once | RECTAL | Status: DC | PRN

## 2021-03-05 MED ORDER — FENTANYL CITRATE (PF) 100 MCG/2ML IJ SOLN
INTRAMUSCULAR | Status: AC
  Filled 2021-03-05: qty 2

## 2021-03-05 MED ORDER — PHENYLEPHRINE HCL (PRESSORS) 10 MG/ML IV SOLN
INTRAVENOUS | Status: AC
  Filled 2021-03-05: qty 2

## 2021-03-05 MED ORDER — OXYCODONE HCL 5 MG PO TABS: 5.0000 mg | ORAL_TABLET | ORAL | Status: DC | PRN

## 2021-03-05 MED ORDER — ROCURONIUM BROMIDE 100 MG/10ML IV SOLN
INTRAVENOUS | Status: DC | PRN
  Administered 2021-03-05: 60 mg via INTRAVENOUS

## 2021-03-05 MED ORDER — SULFASALAZINE 500 MG PO TABS
1000.0000 mg | ORAL_TABLET | Freq: Two times a day (BID) | ORAL | Status: DC
  Administered 2021-03-06: 1000 mg via ORAL
  Filled 2021-03-05: qty 2

## 2021-03-05 MED ORDER — METHOCARBAMOL 1000 MG/10ML IJ SOLN
500.0000 mg | Freq: Four times a day (QID) | INTRAVENOUS | Status: DC | PRN
  Filled 2021-03-05: qty 5

## 2021-03-05 MED ORDER — FENTANYL CITRATE PF 50 MCG/ML IJ SOSY: 25.0000 ug | PREFILLED_SYRINGE | INTRAMUSCULAR | Status: DC | PRN

## 2021-03-05 MED ORDER — ONDANSETRON HCL 4 MG PO TABS: 4.0000 mg | ORAL_TABLET | Freq: Four times a day (QID) | ORAL | Status: DC | PRN

## 2021-03-05 MED ORDER — PHENYLEPHRINE HCL-NACL 20-0.9 MG/250ML-% IV SOLN
INTRAVENOUS | Status: DC | PRN
  Administered 2021-03-05: 50 ug/min via INTRAVENOUS

## 2021-03-05 MED ORDER — POLYETHYLENE GLYCOL 3350 17 G PO PACK: 17.0000 g | PACK | Freq: Every day | ORAL | Status: DC | PRN

## 2021-03-05 MED ORDER — ONDANSETRON HCL 4 MG/2ML IJ SOLN
INTRAMUSCULAR | Status: AC
  Filled 2021-03-05: qty 2

## 2021-03-05 MED ORDER — LEVOTHYROXINE SODIUM 88 MCG PO TABS: 88.0000 ug | ORAL_TABLET | ORAL | Status: DC

## 2021-03-05 MED ORDER — ALBUTEROL SULFATE (2.5 MG/3ML) 0.083% IN NEBU: 3.0000 mL | INHALATION_SOLUTION | Freq: Four times a day (QID) | RESPIRATORY_TRACT | Status: DC | PRN

## 2021-03-05 MED ORDER — FLUTICASONE PROPIONATE 50 MCG/ACT NA SUSP: 1.0000 | NASAL | Status: DC | PRN

## 2021-03-05 MED ORDER — LIDOCAINE HCL (CARDIAC) PF 100 MG/5ML IV SOSY
PREFILLED_SYRINGE | INTRAVENOUS | Status: DC | PRN
  Administered 2021-03-05: 50 mg via INTRAVENOUS

## 2021-03-05 MED ORDER — ALBUMIN HUMAN 5 % IV SOLN
INTRAVENOUS | Status: AC
  Filled 2021-03-05: qty 250

## 2021-03-05 MED ORDER — TRANEXAMIC ACID-NACL 1000-0.7 MG/100ML-% IV SOLN
1000.0000 mg | INTRAVENOUS | Status: AC
  Administered 2021-03-05: 1000 mg via INTRAVENOUS
  Filled 2021-03-05: qty 100

## 2021-03-05 MED ORDER — MIDAZOLAM HCL 2 MG/2ML IJ SOLN
INTRAMUSCULAR | Status: AC
  Filled 2021-03-05: qty 2

## 2021-03-05 MED ORDER — CHLORHEXIDINE GLUCONATE 0.12 % MT SOLN
15.0000 mL | Freq: Once | OROMUCOSAL | Status: AC
  Administered 2021-03-05: 15 mL via OROMUCOSAL

## 2021-03-05 MED ORDER — ALUM & MAG HYDROXIDE-SIMETH 200-200-20 MG/5ML PO SUSP: 30.0000 mL | ORAL | Status: DC | PRN

## 2021-03-05 MED ORDER — LABETALOL HCL 5 MG/ML IV SOLN
INTRAVENOUS | Status: DC | PRN
  Administered 2021-03-05: 2.5 mg via INTRAVENOUS

## 2021-03-05 MED ORDER — ONDANSETRON HCL 4 MG/2ML IJ SOLN
INTRAMUSCULAR | Status: DC | PRN
Start: 2021-03-05 — End: 2021-03-05
  Administered 2021-03-05: 4 mg via INTRAVENOUS

## 2021-03-05 MED ORDER — ORAL CARE MOUTH RINSE: 15.0000 mL | Freq: Once | OROMUCOSAL | Status: AC

## 2021-03-05 MED ORDER — ACETAMINOPHEN 325 MG PO TABS: 325.0000 mg | ORAL_TABLET | ORAL | Status: DC | PRN

## 2021-03-05 MED ORDER — FENTANYL CITRATE PF 50 MCG/ML IJ SOSY
PREFILLED_SYRINGE | INTRAMUSCULAR | Status: AC
  Filled 2021-03-05: qty 1

## 2021-03-05 MED ORDER — DIPHENHYDRAMINE HCL 12.5 MG/5ML PO ELIX: 12.5000 mg | ORAL_SOLUTION | ORAL | Status: DC | PRN

## 2021-03-05 MED ORDER — ACETAMINOPHEN 160 MG/5ML PO SOLN: 325.0000 mg | ORAL | Status: DC | PRN

## 2021-03-05 MED ORDER — ACETAMINOPHEN 500 MG PO TABS
1000.0000 mg | ORAL_TABLET | Freq: Four times a day (QID) | ORAL | Status: DC
  Administered 2021-03-05 – 2021-03-06 (×3): 1000 mg via ORAL
  Filled 2021-03-05 (×3): qty 2

## 2021-03-05 MED ORDER — LEVOTHYROXINE SODIUM 100 MCG PO TABS
100.0000 ug | ORAL_TABLET | ORAL | Status: DC
  Administered 2021-03-06: 100 ug via ORAL
  Filled 2021-03-05: qty 1

## 2021-03-05 MED ORDER — MIDAZOLAM HCL 5 MG/5ML IJ SOLN
INTRAMUSCULAR | Status: DC | PRN
Start: 2021-03-05 — End: 2021-03-05
  Administered 2021-03-05: 1 mg via INTRAVENOUS

## 2021-03-05 MED ORDER — POTASSIUM CHLORIDE IN NACL 20-0.45 MEQ/L-% IV SOLN
INTRAVENOUS | Status: DC
  Filled 2021-03-05: qty 1000

## 2021-03-05 MED ORDER — BISACODYL 5 MG PO TBEC: 5.0000 mg | DELAYED_RELEASE_TABLET | Freq: Every day | ORAL | Status: DC | PRN

## 2021-03-05 MED ORDER — MONTELUKAST SODIUM 10 MG PO TABS
10.0000 mg | ORAL_TABLET | Freq: Every day | ORAL | Status: DC
  Administered 2021-03-05: 10 mg via ORAL
  Filled 2021-03-05: qty 1

## 2021-03-05 MED ORDER — DEXAMETHASONE SODIUM PHOSPHATE 10 MG/ML IJ SOLN
INTRAMUSCULAR | Status: DC | PRN
  Administered 2021-03-05: 8 mg via INTRAVENOUS

## 2021-03-05 MED ORDER — LIDOCAINE HCL (PF) 2 % IJ SOLN
INTRAMUSCULAR | Status: AC
  Filled 2021-03-05: qty 5

## 2021-03-05 MED ORDER — DOCUSATE SODIUM 100 MG PO CAPS
100.0000 mg | ORAL_CAPSULE | Freq: Two times a day (BID) | ORAL | Status: DC
  Administered 2021-03-05 – 2021-03-06 (×2): 100 mg via ORAL
  Filled 2021-03-05 (×2): qty 1

## 2021-03-05 MED ORDER — HYDROMORPHONE HCL 1 MG/ML IJ SOLN: 0.5000 mg | INTRAMUSCULAR | Status: DC | PRN

## 2021-03-05 MED ORDER — METOCLOPRAMIDE HCL 5 MG PO TABS: 5.0000 mg | ORAL_TABLET | Freq: Three times a day (TID) | ORAL | Status: DC | PRN

## 2021-03-05 MED ORDER — ONDANSETRON HCL 4 MG/2ML IJ SOLN: 4.0000 mg | Freq: Four times a day (QID) | INTRAMUSCULAR | Status: DC | PRN

## 2021-03-05 MED ORDER — PROPOFOL 10 MG/ML IV BOLUS
INTRAVENOUS | Status: AC
  Filled 2021-03-05: qty 20

## 2021-03-05 MED ORDER — SUGAMMADEX SODIUM 200 MG/2ML IV SOLN
INTRAVENOUS | Status: DC | PRN
  Administered 2021-03-05: 100 mg via INTRAVENOUS
  Administered 2021-03-05: 200 mg via INTRAVENOUS

## 2021-03-05 MED ORDER — PHENYLEPHRINE HCL (PRESSORS) 10 MG/ML IV SOLN
INTRAVENOUS | Status: DC | PRN
  Administered 2021-03-05: 60 ug via INTRAVENOUS

## 2021-03-05 MED ORDER — CEFAZOLIN SODIUM-DEXTROSE 2-4 GM/100ML-% IV SOLN
2.0000 g | INTRAVENOUS | Status: AC
  Administered 2021-03-05: 2 g via INTRAVENOUS
  Filled 2021-03-05: qty 100

## 2021-03-05 MED ORDER — WATER FOR IRRIGATION, STERILE IR SOLN
Status: DC | PRN
  Administered 2021-03-05: 2000 mL

## 2021-03-05 MED ORDER — FENTANYL CITRATE (PF) 100 MCG/2ML IJ SOLN
INTRAMUSCULAR | Status: DC | PRN
  Administered 2021-03-05: 25 ug via INTRAVENOUS
  Administered 2021-03-05: 50 ug via INTRAVENOUS

## 2021-03-05 MED ORDER — ONDANSETRON HCL 4 MG/2ML IJ SOLN
INTRAMUSCULAR | Status: AC
  Administered 2021-03-05: 4 mg via INTRAVENOUS
  Filled 2021-03-05: qty 2

## 2021-03-05 MED ORDER — OXYCODONE HCL 5 MG/5ML PO SOLN: 5.0000 mg | Freq: Once | ORAL | Status: DC | PRN

## 2021-03-05 MED ORDER — LABETALOL HCL 5 MG/ML IV SOLN
INTRAVENOUS | Status: AC
  Filled 2021-03-05: qty 4

## 2021-03-05 MED ORDER — ACETAMINOPHEN 325 MG PO TABS: 325.0000 mg | ORAL_TABLET | Freq: Four times a day (QID) | ORAL | Status: DC | PRN

## 2021-03-05 MED ORDER — OXYCODONE HCL 5 MG PO TABS: 10.0000 mg | ORAL_TABLET | ORAL | Status: DC | PRN

## 2021-03-05 MED ORDER — 0.9 % SODIUM CHLORIDE (POUR BTL) OPTIME
TOPICAL | Status: DC | PRN
  Administered 2021-03-05: 1000 mL

## 2021-03-05 MED ORDER — ZOLPIDEM TARTRATE 5 MG PO TABS
5.0000 mg | ORAL_TABLET | Freq: Every evening | ORAL | Status: DC | PRN
  Administered 2021-03-06: 5 mg via ORAL
  Filled 2021-03-05: qty 1

## 2021-03-05 MED ORDER — ROCURONIUM BROMIDE 10 MG/ML (PF) SYRINGE
PREFILLED_SYRINGE | INTRAVENOUS | Status: AC
  Filled 2021-03-05: qty 10

## 2021-03-05 SURGICAL SUPPLY — 80 items
AID PSTN UNV HD RSTRNT DISP (MISCELLANEOUS)
BAG COUNTER SPONGE SURGICOUNT (BAG) IMPLANT
BAG SPEC THK2 15X12 ZIP CLS (MISCELLANEOUS) ×1
BAG SPNG CNTER NS LX DISP (BAG)
BAG ZIPLOCK 12X15 (MISCELLANEOUS) ×2 IMPLANT
BASEPLATE P2 COATD GLND 6.5X30 (Shoulder) IMPLANT
BIT DRILL 1.6MX128 (BIT) IMPLANT
BIT DRILL 2.5 DIA 127 CALI (BIT) ×1 IMPLANT
BIT DRILL 4 DIA CALIBRATED (BIT) ×1 IMPLANT
BLADE SAW SAG 73X25 THK (BLADE) ×1
BLADE SAW SGTL 73X25 THK (BLADE) ×1 IMPLANT
BOOTIES KNEE HIGH SLOAN (MISCELLANEOUS) ×4 IMPLANT
BSPLAT GLND 30 STRL LF SHLDR (Shoulder) ×1 IMPLANT
COOLER ICEMAN CLASSIC (MISCELLANEOUS) IMPLANT
COVER BACK TABLE 60X90IN (DRAPES) ×2 IMPLANT
COVER SURGICAL LIGHT HANDLE (MISCELLANEOUS) ×2 IMPLANT
DRAPE INCISE IOBAN 66X45 STRL (DRAPES) ×2 IMPLANT
DRAPE ORTHO SPLIT 77X108 STRL (DRAPES) ×4
DRAPE POUCH INSTRU U-SHP 10X18 (DRAPES) ×2 IMPLANT
DRAPE SHEET LG 3/4 BI-LAMINATE (DRAPES) ×2 IMPLANT
DRAPE SURG 17X11 SM STRL (DRAPES) ×2 IMPLANT
DRAPE SURG ORHT 6 SPLT 77X108 (DRAPES) ×2 IMPLANT
DRAPE TOP 10253 STERILE (DRAPES) ×2 IMPLANT
DRAPE U-SHAPE 47X51 STRL (DRAPES) ×2 IMPLANT
DRSG AQUACEL AG ADV 3.5X 6 (GAUZE/BANDAGES/DRESSINGS) ×2 IMPLANT
DURAPREP 26ML APPLICATOR (WOUND CARE) ×4 IMPLANT
ELECT BLADE TIP CTD 4 INCH (ELECTRODE) ×2 IMPLANT
ELECT REM PT RETURN 15FT ADLT (MISCELLANEOUS) ×2 IMPLANT
GLOVE SRG 8 PF TXTR STRL LF DI (GLOVE) ×1 IMPLANT
GLOVE SURG ENC MOIS LTX SZ7.5 (GLOVE) ×2 IMPLANT
GLOVE SURG POLYISO LF SZ6.5 (GLOVE) ×2 IMPLANT
GLOVE SURG UNDER POLY LF SZ6.5 (GLOVE) ×2 IMPLANT
GLOVE SURG UNDER POLY LF SZ8 (GLOVE) ×2
GOWN STRL REUS W/TWL LRG LVL3 (GOWN DISPOSABLE) ×2 IMPLANT
GOWN STRL REUS W/TWL XL LVL3 (GOWN DISPOSABLE) ×2 IMPLANT
HANDPIECE INTERPULSE COAX TIP (DISPOSABLE) ×2
HOOD PEEL AWAY FLYTE STAYCOOL (MISCELLANEOUS) ×6 IMPLANT
HUMERA STEM SM SHELL SHOU 10 (Miscellaneous) ×2 IMPLANT
INSERT SMALL SOCKET 32MM NEU (Insert) ×1 IMPLANT
KIT BASIN OR (CUSTOM PROCEDURE TRAY) ×2 IMPLANT
KIT TURNOVER KIT A (KITS) ×2 IMPLANT
MANIFOLD NEPTUNE II (INSTRUMENTS) ×2 IMPLANT
NDL TROCAR POINT SZ 2 1/2 (NEEDLE) IMPLANT
NEEDLE TROCAR POINT SZ 2 1/2 (NEEDLE) IMPLANT
NS IRRIG 1000ML POUR BTL (IV SOLUTION) ×2 IMPLANT
P2 COATDE GLNOID BSEPLT 6.5X30 (Shoulder) ×2 IMPLANT
PACK SHOULDER (CUSTOM PROCEDURE TRAY) ×2 IMPLANT
PAD COLD SHLDR WRAP-ON (PAD) IMPLANT
PROTECTOR NERVE ULNAR (MISCELLANEOUS) IMPLANT
RESTRAINT HEAD UNIVERSAL NS (MISCELLANEOUS) IMPLANT
RETRIEVER SUT HEWSON (MISCELLANEOUS) IMPLANT
SCREW BONE LOCKING RSP 5.0X14 (Screw) ×4 IMPLANT
SCREW BONE LOCKING RSP 5.0X30 (Screw) ×2 IMPLANT
SCREW BONE RSP LOCK 5X14 (Screw) IMPLANT
SCREW BONE RSP LOCK 5X26 (Screw) IMPLANT
SCREW BONE RSP LOCK 5X30 (Screw) IMPLANT
SCREW BONE RSP LOCKING 5.0X26 (Screw) ×2 IMPLANT
SCREW RETAIN W/HEAD 4MM OFFSET (Shoulder) ×1 IMPLANT
SET HNDPC FAN SPRY TIP SCT (DISPOSABLE) ×1 IMPLANT
SLING ARM FOAM STRAP MED (SOFTGOODS) ×1 IMPLANT
SLING ARM IMMOBILIZER LRG (SOFTGOODS) IMPLANT
SLING ARM IMMOBILIZER MED (SOFTGOODS) IMPLANT
SPONGE T-LAP 18X18 ~~LOC~~+RFID (SPONGE) ×2 IMPLANT
STEM HUMERAL SM SHELL SHOU 10 (Miscellaneous) IMPLANT
STRIP CLOSURE SKIN 1/2X4 (GAUZE/BANDAGES/DRESSINGS) ×4 IMPLANT
SUCTION FRAZIER HANDLE 10FR (MISCELLANEOUS)
SUCTION TUBE FRAZIER 10FR DISP (MISCELLANEOUS) IMPLANT
SUPPORT WRAP ARM LG (MISCELLANEOUS) IMPLANT
SUT ETHIBOND 2 V 37 (SUTURE) IMPLANT
SUT FIBERWIRE #2 38 REV NDL BL (SUTURE)
SUT MNCRL AB 4-0 PS2 18 (SUTURE) ×2 IMPLANT
SUT VIC AB 2-0 CT1 27 (SUTURE) ×2
SUT VIC AB 2-0 CT1 TAPERPNT 27 (SUTURE) ×1 IMPLANT
SUTURE FIBERWR#2 38 REV NDL BL (SUTURE) IMPLANT
TAPE LABRALWHITE 1.5X36 (TAPE) IMPLANT
TAPE STRIPS DRAPE STRL (GAUZE/BANDAGES/DRESSINGS) ×1 IMPLANT
TAPE SUT LABRALTAP WHT/BLK (SUTURE) IMPLANT
TOWEL OR 17X26 10 PK STRL BLUE (TOWEL DISPOSABLE) ×2 IMPLANT
TOWEL OR NON WOVEN STRL DISP B (DISPOSABLE) ×2 IMPLANT
WATER STERILE IRR 1000ML POUR (IV SOLUTION) ×2 IMPLANT

## 2021-03-05 NOTE — Anesthesia Postprocedure Evaluation (Signed)
Anesthesia Post Note  Patient: Evelyn Orr [facility]  Procedure(s) Performed: REVERSE SHOULDER ARTHROPLASTY (Right: Shoulder)     Patient location during evaluation: PACU Anesthesia Type: General Level of consciousness: awake and alert and oriented Pain management: pain level controlled Vital Signs Assessment: post-procedure vital signs reviewed and stable Respiratory status: spontaneous breathing, nonlabored ventilation and respiratory function stable Cardiovascular status: blood pressure returned to baseline and stable Postop Assessment: no apparent nausea or vomiting Anesthetic complications: no   No notable events documented.  Last Vitals:  Vitals:   03/05/21 1500 03/05/21 1515  BP: 130/75 (!) 153/91  Pulse: 73   Resp: 20 20  Temp: (!) 36.1 C   SpO2: 100%     Last Pain:  Vitals:   03/05/21 1515  TempSrc:   PainSc: 0-No pain                 Thu Baggett A.

## 2021-03-05 NOTE — Anesthesia Preprocedure Evaluation (Addendum)
Anesthesia Evaluation  Patient identified by MRN, date of birth, ID band Patient awake    Reviewed: Allergy & Precautions, NPO status , Patient's Chart, lab work & pertinent test results  History of Anesthesia Complications (+) PONV and history of anesthetic complications  Airway Mallampati: II  TM Distance: >3 FB Neck ROM: Full    Dental no notable dental hx. (+) Poor Dentition, Chipped, Missing, Dental Advisory Given   Pulmonary former smoker,    Pulmonary exam normal breath sounds clear to auscultation       Cardiovascular negative cardio ROS Normal cardiovascular exam Rhythm:Regular Rate:Normal     Neuro/Psych    GI/Hepatic Neg liver ROS,   Endo/Other  Hypothyroidism   Renal/GU negative Renal ROS     Musculoskeletal  (+) Arthritis ,   Abdominal   Peds  Hematology   Anesthesia Other Findings   Reproductive/Obstetrics                           Anesthesia Physical  Anesthesia Plan  ASA: 3  Anesthesia Plan: General   Post-op Pain Management:  Regional for Post-op pain   Induction:   PONV Risk Score and Plan: 4 or greater and Ondansetron, Dexamethasone, Treatment may vary due to age or medical condition and Midazolam  Airway Management Planned: Oral ETT  Additional Equipment: None  Intra-op Plan:   Post-operative Plan: Extubation in OR  Informed Consent: I have reviewed the patients History and Physical, chart, labs and discussed the procedure including the risks, benefits and alternatives for the proposed anesthesia with the patient or authorized representative who has indicated his/her understanding and acceptance.     Dental advisory given  Plan Discussed with: CRNA and Anesthesiologist  Anesthesia Plan Comments:         Anesthesia Quick Evaluation

## 2021-03-05 NOTE — Progress Notes (Signed)
AssistedDr. Ambrose Pancoast with right, ultrasound guided, interscalene  block. Side rails up, monitors on throughout procedure. See vital signs in flow sheet. Tolerated Procedure well.

## 2021-03-05 NOTE — H&P (Signed)
Evelyn Orr is an 75 y.o. female.   Chief Complaint: R shoulder pain and dysfunction HPI: Endstage R shoulder arthritis with rotator cuff disease, significant pain and dysfunction, failed conservative measures.  Pain interferes with sleep and quality of life.   Past Medical History:  Diagnosis Date   Arthritis    Breast cancer ([place])    Breast cancer of upper-outer quadrant of right female breast ([place]) 03/20/2015   Cough    Hypothyroidism    Nasal congestion    PONV (postoperative nausea and vomiting)    Rheumatoid arthritis(714.0)    Seasonal allergies    Thyroid disease    hypothyroidism    Past Surgical History:  Procedure Laterality Date   BREAST LUMPECTOMY WITH RADIOACTIVE SEED LOCALIZATION Right 04/09/2015   Procedure: BREAST LUMPECTOMY WITH RADIOACTIVE SEED LOCALIZATION;  Surgeon: Rolm Bookbinder, MD;  Location: [facility];  Service: General;  Laterality: Right;   BREAST REDUCTION SURGERY  2001   CALCANEAL OSTEOTOMY Left 10/11/2013   Procedure: LEFT CALCANEAL OSTEOTOMY;  Surgeon: Wylene Simmer, MD;  Location: [facility];  Service: Orthopedics;  Laterality: Left;   CARPAL TUNNEL RELEASE     left   CERVICAL FUSION  2009   COLONOSCOPY     2002,2005,2008 w/[facility]   GASTROCNEMIUS RECESSION Left 10/11/2013   Procedure: LEFT GASTROC RECESSION; LEFT POSTERIOR TIBIAL TENOLYSIS;  Surgeon: Wylene Simmer, MD;  Location: [place];  Service: Orthopedics;  Laterality: Left;   KNEE ARTHROSCOPY  2004   right   POLYPECTOMY     REMOVAL OF IMPLANT Left 02/14/2014   Procedure: LEFT FOOT REMOVAL OF DEEP IMPLANT;  Surgeon: Wylene Simmer, MD;  Location: [place];  Service: Orthopedics;  Laterality: Left;   SHOULDER ARTHROSCOPY W/ ROTATOR CUFF REPAIR  2006   right   THYROIDECTOMY     age 31   TONSILLECTOMY     TOTAL KNEE ARTHROPLASTY Right 06/21/2016   Procedure: RIGHT TOTAL KNEE ARTHROPLASTY;  Surgeon: Paralee Cancel, MD;   Location: [facility];  Service: Orthopedics;  Laterality: Right;   TUBAL LIGATION     WRIST ARTHRODESIS  2003   left     Family History  Problem Relation Age of Onset   Heart disease Father    Heart attack Father    Hypertension Mother    Breast cancer Paternal Aunt        dx. 13 or younger   Colon cancer Paternal Uncle    Prostate cancer Maternal Uncle    Other Sister        high breast density   Prostate cancer Brother 21       unknown Gleason   Prostate cancer Maternal Grandfather        dx. older age   Colon polyps Son 48       colonoscopy for unspecified reason; found some colon polyps, has not had one since   Prostate cancer Maternal Uncle    Prostate cancer Maternal Uncle    Colon cancer Other    Cancer Other        MGM's sister   Breast cancer Cousin        4 maternal cousin had breast cancer    Rectal cancer Neg Hx    Stomach cancer Neg Hx    Social History:  reports that she quit smoking about 37 years ago. Her smoking use included cigarettes. She has a 5.25 pack-year smoking history. She has never used smokeless tobacco. She reports  that she does not drink alcohol and does not use drugs.  Allergies: No Known Allergies  Medications Prior to Admission  Medication Sig Dispense Refill   albuterol (VENTOLIN HFA) 108 (90 Base) MCG/ACT inhaler Inhale 2 puffs into the lungs every 6 (six) hours as needed for wheezing or shortness of breath. 18 g 1   benzonatate (TESSALON) 100 MG capsule Take 1 capsule (100 mg total) by mouth 2 (two) times daily as needed for cough. 30 capsule 1   levothyroxine (SYNTHROID) 100 MCG tablet Take 100 mcg by mouth See admin instructions. Monday - Friday     methotrexate (RHEUMATREX) 2.5 MG tablet Take 20 mg by mouth once a week. 8 tablets weekly  1   mometasone (NASONEX) 50 MCG/ACT nasal spray Place 2 sprays into the nose daily. (Patient taking differently: Place 2 sprays into the nose daily as needed (Congestion).) 17 g 2   montelukast  (SINGULAIR) 10 MG tablet TAKE 1 TABLET BY MOUTH EVERYDAY AT BEDTIME (Patient taking differently: Take 10 mg by mouth at bedtime.) 30 tablet 2   sulfaSALAzine (AZULFIDINE) 500 MG tablet Take 1,000 mg by mouth 2 (two) times daily.     triamcinolone (KENALOG) 0.1 % Apply 1 application topically daily as needed (itching).     Vitamin D, Ergocalciferol, (DRISDOL) 1.25 MG (50000 UT) CAPS capsule TAKE 1 CAPSULE BY MOUTH 2 TIMES EVERY WEEK 8 capsule 0   COVID-19 Specimen Collection KIT See admin instructions. for testing     levothyroxine (SYNTHROID) 88 MCG tablet TAKE 1 TABLET BY MOUTH EVERY DAY (Patient taking differently: Take 88 mcg by mouth See admin instructions. Saturday and Sunday) 90 tablet 2    Results for orders placed or performed during the hospital encounter of 03/04/21 (from the past 48 hour(s))  CBC WITH DIFFERENTIAL     Status: None   Collection Time: 03/04/21  9:00 AM  Result Value Ref Range   WBC 4.9 4.0 - 10.5 K/uL   RBC 4.04 3.87 - 5.11 MIL/uL   Hemoglobin 12.5 12.0 - 15.0 g/dL   HCT 39.5 36.0 - 46.0 %   MCV 97.8 80.0 - 100.0 fL   MCH 30.9 26.0 - 34.0 pg   MCHC 31.6 30.0 - 36.0 g/dL   RDW 14.6 11.5 - 15.5 %   Platelets 316 150 - 400 K/uL   nRBC 0.0 0.0 - 0.2 %   Neutrophils Relative % 39 %   Neutro Abs 1.9 1.7 - 7.7 K/uL   Lymphocytes Relative 51 %   Lymphs Abs 2.5 0.7 - 4.0 K/uL   Monocytes Relative 6 %   Monocytes Absolute 0.3 0.1 - 1.0 K/uL   Eosinophils Relative 3 %   Eosinophils Absolute 0.1 0.0 - 0.5 K/uL   Basophils Relative 1 %   Basophils Absolute 0.0 0.0 - 0.1 K/uL   Immature Granulocytes 0 %   Abs Immature Granulocytes 0.01 0.00 - 0.07 K/uL    Comment: Performed at [facility], [place] [address], [place], [place] [address]  Comprehensive metabolic panel     Status: None   Collection Time: 03/04/21  9:00 AM  Result Value Ref Range   Sodium 144 135 - 145 mmol/L   Potassium 3.7 3.5 - 5.1 mmol/L   Chloride 106 98 - 111 mmol/L   CO2 27 22 -  32 mmol/L   Glucose, Bld 82 70 - 99 mg/dL    Comment: Glucose reference range applies only to samples taken after fasting for at least 8  hours.   BUN 9 8 - 23 mg/dL   Creatinine, Ser 0.69 0.44 - 1.00 mg/dL   Calcium 9.4 8.9 - 10.3 mg/dL   Total Protein 7.9 6.5 - 8.1 g/dL   Albumin 3.9 3.5 - 5.0 g/dL   AST 30 15 - 41 U/L   ALT 13 0 - 44 U/L   Alkaline Phosphatase 105 38 - 126 U/L   Total Bilirubin 0.5 0.3 - 1.2 mg/dL   GFR, Estimated >60 >60 mL/min    Comment: (NOTE) Calculated using the CKD-EPI Creatinine Equation (2021)    Anion gap 11 5 - 15    Comment: Performed at [facility], [place] [address], [place], [place] [address]  Urinalysis, Routine w reflex microscopic Urine, Clean Catch     Status: Abnormal   Collection Time: 03/04/21  9:00 AM  Result Value Ref Range   Color, Urine YELLOW YELLOW   APPearance CLEAR CLEAR   Specific Gravity, Urine 1.002 (L) 1.005 - 1.030   pH 7.0 5.0 - 8.0   Glucose, UA NEGATIVE NEGATIVE mg/dL   Hgb urine dipstick SMALL (A) NEGATIVE   Bilirubin Urine NEGATIVE NEGATIVE   Ketones, ur NEGATIVE NEGATIVE mg/dL   Protein, ur NEGATIVE NEGATIVE mg/dL   Nitrite NEGATIVE NEGATIVE   Leukocytes,Ua NEGATIVE NEGATIVE   WBC, UA 0-5 0 - 5 WBC/hpf   Bacteria, UA NONE SEEN NONE SEEN   Squamous Epithelial / LPF 0-5 0 - 5    Comment: Performed at [facility], [place] [address], [place], [place] [address]  Type and screen Order type and screen if day of surgery is less than 15 days from draw of preadmission visit or order morning of surgery if day of surgery is greater than 6 days from preadmission visit.     Status: None   Collection Time: 03/04/21  9:00 AM  Result Value Ref Range   ABO/RH(D) O POS    Antibody Screen NEG    Sample Expiration 03/08/2021,2359    Extend sample reason      NO TRANSFUSIONS OR PREGNANCY IN THE PAST 3 MONTHS Performed at [facility], [place] [address], [place], [place] [address]    Surgical pcr screen     Status: None   Collection Time: 03/04/21  9:01 AM   Specimen: Nasal Swab  Result Value Ref Range   MRSA, PCR NEGATIVE NEGATIVE   Staphylococcus aureus NEGATIVE NEGATIVE    Comment: (NOTE) The Xpert SA Assay (FDA approved for NASAL specimens in patients 47 years of age and older), is one component of a comprehensive surveillance program. It is not intended to diagnose infection nor to guide or monitor treatment. Performed at [facility], [place] [address] [place],  [address]    DG Chest 2 View  Result Date: 03/05/2021 CLINICAL DATA:  Preop for shoulder surgery. EXAM: CHEST - 2 VIEW COMPARISON:  March 01, 2018. FINDINGS: The heart size and mediastinal contours are within normal limits. Both lungs are clear. The visualized skeletal structures are unremarkable. IMPRESSION: No active cardiopulmonary disease. Electronically Signed   By: Marijo Conception M.D.   On: 03/05/2021 11:06    Review of Systems  All other systems reviewed and are negative.  Blood pressure (!) 158/86, pulse 80, temperature 98.3 F (36.8 C), temperature source Oral, resp. rate (!) 23, height 5' 2"  (1.575 m), weight 71 kg, SpO2 100 %. Physical Exam HENT:     Head: Atraumatic.  Eyes:  Extraocular Movements: Extraocular movements intact.  Cardiovascular:     Pulses: Normal pulses.  Pulmonary:     Effort: Pulmonary effort is normal.  Musculoskeletal:     Comments: R shoulder pain with motion. NVID  Neurological:     Mental Status: She is alert.     Assessment/Plan R shoulder endstage OA with RC disease Plan R reverse TSA Risks / benefits of surgery discussed Consent on chart  NPO for OR Preop antibiotics   Rhae Hammock, MD 03/05/2021, 12:45 PM

## 2021-03-05 NOTE — Discharge Instructions (Addendum)
Discharge Instructions after Reverse Total Shoulder Arthroplasty   A sling has been provided for you. You are to wear this at all times (except for bathing and dressing), until your first post operative visit with Dr. Tamera Punt. Please also wear while sleeping at night. While you bath and dress, let the arm/elbow extend straight down to stretch your elbow. Wiggle your fingers and pump your first while your in the sling to prevent hand swelling. Use ice on the shoulder intermittently over the first 48 hours after surgery. Continue to use ice or and ice machine as needed after 48 hours for pain control/swelling.  Pain medicine has been prescribed for you.  Use your medicine liberally over the first 48 hours, and then you can begin to taper your use. You may take Extra Strength Tylenol or Tylenol only in place of the pain pills. DO NOT take ANY nonsteroidal anti-inflammatory pain medications: Advil, Motrin, Ibuprofen, Aleve, Naproxen or Naprosyn.  Take one aspirin 81mg  a day for 2 weeks after surgery, unless you have an aspirin sensitivity/allergy or asthma.  Leave your dressing on until your first follow up visit.  You may shower with the dressing.  Hold your arm as if you still have your sling on while you shower. Simply allow the water to wash over the site and then pat dry. Make sure your axilla (armpit) is completely dry after showering.    Please call [phone] during normal business hours or [phone] after hours for any problems. Including the following:  - excessive redness of the incisions - drainage for more than 4 days - fever of more than 101.5 F  *Please note that pain medications will not be refilled after hours or on weekends.  Dental Antibiotics:  In most cases prophylactic antibiotics for Dental procdeures after total joint surgery are not necessary.  Exceptions are as follows:  1. History of prior total joint infection  2. Severely immunocompromised (Organ  Transplant, cancer chemotherapy, Rheumatoid biologic meds such as [place])  3. Poorly controlled diabetes (A1C &gt; 8.0, blood glucose over 200)  If you have one of these conditions, contact your surgeon for an antibiotic prescription, prior to your dental procedure.

## 2021-03-05 NOTE — Transfer of Care (Signed)
Immediate Anesthesia Transfer of Care Note  Patient: Evelyn Orr [facility]  Procedure(s) Performed: REVERSE SHOULDER ARTHROPLASTY (Right: Shoulder)  Patient Location: PACU  Anesthesia Type:General  Level of Consciousness: awake, alert , oriented and patient cooperative  Airway & Oxygen Therapy: Patient Spontanous Breathing and Patient connected to face mask oxygen  Post-op Assessment: Report given to RN and Post -op Vital signs reviewed and stable  Post vital signs: Reviewed and stable  Last Vitals:  Vitals Value Taken Time  BP    Temp    Pulse 74 03/05/21 1501  Resp 19 03/05/21 1501  SpO2 100 % 03/05/21 1501  Vitals shown include unvalidated device data.  Last Pain:  Vitals:   03/05/21 1308  TempSrc:   PainSc: Asleep      Patients Stated Pain Goal: 4 (30/05/11 0211)  Complications: No notable events documented.

## 2021-03-05 NOTE — Anesthesia Procedure Notes (Signed)
Procedure Name: Intubation Date/Time: 03/05/2021 1:26 PM Performed by: Garrel Ridgel, CRNA Pre-anesthesia Checklist: Patient identified, Emergency Drugs available, Suction available and Patient being monitored Patient Re-evaluated:Patient Re-evaluated prior to induction Oxygen Delivery Method: Circle system utilized Preoxygenation: Pre-oxygenation with 100% oxygen Induction Type: IV induction Ventilation: Mask ventilation without difficulty Laryngoscope Size: Mac and 3 Grade View: Grade I Tube type: Oral Tube size: 7.0 mm Number of attempts: 1 Airway Equipment and Method: Stylet and Oral airway Placement Confirmation: ETT inserted through vocal cords under direct vision, positive ETCO2 and breath sounds checked- equal and bilateral Secured at: 21 cm Tube secured with: Tape Dental Injury: Teeth and Oropharynx as per pre-operative assessment

## 2021-03-05 NOTE — Anesthesia Procedure Notes (Signed)
Anesthesia Regional Block: Interscalene brachial plexus block   Pre-Anesthetic Checklist: , timeout performed,  Correct Patient, Correct Site, Correct Laterality,  Correct Procedure, Correct Position, site marked,  Risks and benefits discussed,  Surgical consent,  Pre-op evaluation,  At surgeon's request and post-op pain management  Laterality: Right  Prep: chloraprep       Needles:  Injection technique: Single-shot  Needle Type: Echogenic Stimulator Needle     Needle Length: 5cm  Needle Gauge: 22     Additional Needles:   Procedures:, nerve stimulator,,, ultrasound used (permanent image in chart),,     Nerve Stimulator or Paresthesia:  Response: quadraceps contraction, 0.45 mA  Additional Responses:   Narrative:  Start time: 03/05/2021 12:30 PM End time: 03/05/2021 12:39 PM Injection made incrementally with aspirations every 5 mL.  Performed by: Personally  Anesthesiologist: Janeece Riggers, MD  Additional Notes: Functioning IV was confirmed and monitors were applied.  A 109mm 22ga Arrow echogenic stimulator needle was used. Sterile prep and drape,hand hygiene and sterile gloves were used. Ultrasound guidance: relevant anatomy identified, needle position confirmed, local anesthetic spread visualized around nerve(s)., vascular puncture avoided.  Image printed for medical record. Negative aspiration and negative test dose prior to incremental administration of local anesthetic. The patient tolerated the procedure well.   NO PIK

## 2021-03-05 NOTE — Op Note (Signed)
Procedure(s): REVERSE SHOULDER ARTHROPLASTY Procedure Note  Evelyn Orr female 75 y.o. 03/05/2021  Preoperative diagnosis Right shoulder advanced arthritis with rotator cuff disease  Postoperative diagnosis: Same  Procedure(s) and Anesthesia Type:    * REVERSE SHOULDER ARTHROPLASTY - Choice   Indications:  75 y.o. female  With endstage right shoulder arthritis with advanced rotator cuff disease. Pain and dysfunction interfered with quality of life and nonoperative treatment with activity modification, NSAIDS and injections failed.     Surgeon: Rhae Hammock   Assistants: Sheryle Hail PA-C [name] was present and scrubbed throughout the procedure and was essential in positioning, retraction, exposure, and closure)  Anesthesia: General endotracheal anesthesia with preoperative interscalene block given by the attending anesthesiologist    Procedure Detail  REVERSE SHOULDER ARTHROPLASTY   Estimated Blood Loss:  200 mL         Drains: none  Blood Given: none          Specimens: none        Complications:  * No complications entered in OR log *         Disposition: PACU - hemodynamically stable.         Condition: stable      OPERATIVE FINDINGS:  A DJO Altivate pressfit reverse total shoulder arthroplasty was placed with a  size 10 stem, a 32-4 glenosphere, and a standard-mm poly insert. The base plate  fixation was excellent.  PROCEDURE: The patient was identified in the preoperative holding area  where I personally marked the operative site after verifying site, side,  and procedure with the patient. An interscalene block given by  the attending anesthesiologist in the holding area and the patient was taken back to the operating room where all extremities were  carefully padded in position after general anesthesia was induced. She  was placed in a beach-chair position and the operative upper extremity was  prepped and draped in a standard sterile  fashion. An approximately 10-  cm incision was made from the tip of the coracoid process to the center  point of the humerus at the level of the axilla. Dissection was carried  down through subcutaneous tissues to the level of the cephalic vein  which was taken laterally with the deltoid. The pectoralis major was  retracted medially. The subdeltoid space was developed and the lateral  edge of the conjoined tendon was identified. The undersurface of  conjoined tendon was palpated and the musculocutaneous nerve was not in  the field. Retractor was placed underneath the conjoined and second  retractor was placed lateral into the deltoid. The circumflex humeral  artery and vessels were identified and clamped and coagulated. The  biceps tendon was tenotomized.  The subscapularis was taken down as a peel with the underlying capsule.  The  joint was then gently externally rotated while the capsule was released  from the humeral neck around to just beyond the 6 o'clock position. At  this point, the joint was dislocated and the humeral head was presented  into the wound. The excessive osteophyte formation was removed with a  large rongeur.  The cutting guide was used to make the appropriate  head cut and the head was saved for potentially bone grafting.  The glenoid was exposed with the arm in an  abducted extended position. The anterior and posterior labrum were  completely excised and the capsule was released circumferentially to  allow for exposure of the glenoid for preparation. The 2.5 mm drill was  placed  using the guide in 5-10 inferior angulation and the tap was then advanced in the same hole. Small and large reamers were then used. The tap was then removed and the Metaglene was then screwed in with excellent purchase.  The peripheral guide was then used to drilled measured and filled peripheral locking screws. The size 32-4 glenosphere was then impacted on the [facility] taper and the central screw  was placed. The humerus was then again exposed and the diaphyseal reamers were used followed by the metaphyseal reamers. The final broach was left in place in the proximal trial was placed. The joint was reduced and with this implant it was felt that soft tissue tensioning was appropriate with excellent stability and excellent range of motion. Therefore, final humeral stem was placed press-fit.  And then the trial polyethylene inserts were tested again and the above implant was felt to be the most appropriate for final insertion. The joint was reduced taken through full range of motion and felt to be stable. Soft tissue tension was appropriate.  The joint was then copiously irrigated with pulse  lavage and the wound was then closed. The subscapularis was loosely repaired with FiberWire suture.  Skin was closed with 2-0 Vicryl in a deep dermal layer and 4-0  Monocryl for skin closure. Steri-Strips were applied. Sterile  dressings were then applied as well as a sling. The patient was allowed  to awaken from general anesthesia, transferred to stretcher, and taken  to recovery room in stable condition.   POSTOPERATIVE PLAN: The patient will be observed in the hospital overnight  for pain control and therapy.

## 2021-03-06 DIAGNOSIS — Z87891 Personal history of nicotine dependence: Secondary | ICD-10-CM | POA: Diagnosis not present

## 2021-03-06 DIAGNOSIS — E039 Hypothyroidism, unspecified: Secondary | ICD-10-CM | POA: Diagnosis not present

## 2021-03-06 DIAGNOSIS — Z853 Personal history of malignant neoplasm of breast: Secondary | ICD-10-CM | POA: Diagnosis not present

## 2021-03-06 DIAGNOSIS — M19011 Primary osteoarthritis, right shoulder: Secondary | ICD-10-CM | POA: Diagnosis not present

## 2021-03-06 DIAGNOSIS — Z96651 Presence of right artificial knee joint: Secondary | ICD-10-CM | POA: Diagnosis not present

## 2021-03-06 DIAGNOSIS — Z79899 Other long term (current) drug therapy: Secondary | ICD-10-CM | POA: Diagnosis not present

## 2021-03-06 LAB — CBC
HCT: 36.6 % (ref 36.0–46.0)
Hemoglobin: 12 g/dL (ref 12.0–15.0)
MCH: 31.7 pg (ref 26.0–34.0)
MCHC: 32.8 g/dL (ref 30.0–36.0)
MCV: 96.8 fL (ref 80.0–100.0)
Platelets: 262 10*3/uL (ref 150–400)
RBC: 3.78 MIL/uL — ABNORMAL LOW (ref 3.87–5.11)
RDW: 14.6 % (ref 11.5–15.5)
WBC: 8.9 10*3/uL (ref 4.0–10.5)
nRBC: 0 % (ref 0.0–0.2)

## 2021-03-06 LAB — BASIC METABOLIC PANEL
Anion gap: 4 — ABNORMAL LOW (ref 5–15)
BUN: 10 mg/dL (ref 8–23)
CO2: 23 mmol/L (ref 22–32)
Calcium: 9.3 mg/dL (ref 8.9–10.3)
Chloride: 110 mmol/L (ref 98–111)
Creatinine, Ser: 0.78 mg/dL (ref 0.44–1.00)
GFR, Estimated: >60 mL/min (ref >60)
Glucose, Bld: 110 mg/dL — ABNORMAL HIGH (ref 70–99)
Potassium: 4.2 mmol/L (ref 3.5–5.1)
Sodium: 137 mmol/L (ref 135–145)

## 2021-03-06 MED ORDER — ASPIRIN EC 81 MG PO TBEC
81.0000 mg | DELAYED_RELEASE_TABLET | Freq: Every day | ORAL | Status: DC
  Administered 2021-03-06: 81 mg via ORAL
  Filled 2021-03-06: qty 1

## 2021-03-06 MED ORDER — OXYCODONE-ACETAMINOPHEN 5-325 MG PO TABS: 1.0000 | ORAL_TABLET | Freq: Four times a day (QID) | ORAL | 0 refills | Status: DC | PRN

## 2021-03-06 MED ORDER — METHOCARBAMOL 500 MG PO TABS: 500.0000 mg | ORAL_TABLET | Freq: Four times a day (QID) | ORAL | 0 refills | Status: DC | PRN

## 2021-03-06 NOTE — Progress Notes (Signed)
PATIENT ID: Evelyn Orr  MRN: [id number]  DOB/AGE:  May 09, 1946 / 75 y.o.  1 Day Post-Op Procedure(s) (LRB): REVERSE SHOULDER ARTHROPLASTY (Right)  Subjective: Pain is mild.  Reports numbness in the right UE still. No c/o chest pain or SOB. Voiding well through wick. Positive flatus. Patient sitting comfortably in bed during rounds.   Objective: Vital signs in last 24 hours: Temp:  [97 F (36.1 C)-98.3 F (36.8 C)] 97.8 F (36.6 C) (10/07 0527) Pulse Rate:  [72-94] 76 (10/07 0527) Resp:  [16-27] 16 (10/07 0527) BP: (105-175)/(72-94) 144/88 (10/07 0527) SpO2:  [91 %-100 %] 100 % (10/07 0527) Weight:  [71 kg] 71 kg (10/06 1058)  Intake/Output from previous day: 10/06 0701 - 10/07 0700 In: 2806.4 [P.O.:640; I.V.:1916.4; IV Piggyback:250] Out: 2280 [Urine:2200; Blood:80] Intake/Output this shift: No intake/output data recorded.  Recent Labs    03/04/21 0900 03/06/21 0318  HGB 12.5 12.0   Recent Labs    03/04/21 0900 03/06/21 0318  WBC 4.9 8.9  RBC 4.04 3.78*  HCT 39.5 36.6  PLT 316 262   Recent Labs    03/04/21 0900 03/06/21 0318  NA 144 137  K 3.7 4.2  CL 106 110  CO2 27 23  BUN 9 10  CREATININE 0.69 0.78  GLUCOSE 82 110*  CALCIUM 9.4 9.3    Physical Exam: Neurologically intact Intact pulses distally Incision: dressing C/D/I No cellulitis present over the right shoulder Patient has slight sensation to light touch in right UE but no motor function as block is still in place  Assessment/Plan: 1 Day Post-Op Procedure(s) (LRB): REVERSE SHOULDER ARTHROPLASTY (Right)   Advance diet Up with therapy D/C IV fluids when tolerating POs well Non Weight Bearing (NWB) right UE VTE prophylaxis:  aspirin 81mg  daily  Plan for DC home today. Follow up in office in 2 weeks. Discharge instructions discussed.    Daijha Leggio L. [place], PA-C 03/06/2021, 7:51 AM

## 2021-03-06 NOTE — Evaluation (Signed)
Occupational Therapy Evaluation Patient Details Name: Evelyn Orr MRN: [id number] DOB: [birth year] Today's Date: 03/06/2021   History of Present Illness Patient is a 75 year old female s/p R reverse total shoulder arthroplasty. PMH includes Breast CA, R knee replacement   Clinical Impression   Patient is a 75 year old female s/p shoulder replacement without functional use of right dominant upper extremity secondary to effects of surgery and interscalene block and shoulder precautions. Therapist provided education and instruction to patient in regards to exercises, precautions, positioning, donning upper extremity clothing and bathing while maintaining shoulder precautions, ice and edema management and donning/doffing sling. Patient verbalized understanding and demonstrated as needed. Patient needed assistance to donn shirt and provided with instruction on compensatory strategies to perform ADLs. Patient to follow up with MD for further therapy needs.        Recommendations for follow up therapy are one component of a multi-disciplinary discharge planning process, led by the attending physician.  Recommendations may be updated based on patient status, additional functional criteria and insurance authorization.   Follow Up Recommendations  Follow surgeon's recommendation for DC plan and follow-up therapies    Equipment Recommendations  None recommended by OT       Precautions / Restrictions Precautions Precautions: Shoulder Type of Shoulder Precautions: AROM elbow, wrist, hand ok. A/PROM shoulder NO Shoulder Interventions: Shoulder sling/immobilizer;Off for dressing/bathing/exercises Precaution Booklet Issued: Yes (comment) Required Braces or Orthoses: Sling Restrictions Weight Bearing Restrictions: Yes RUE Weight Bearing: Non weight bearing      Mobility Bed Mobility Overal bed mobility: Modified Independent             General bed mobility comments: HOB elevated     Transfers Overall transfer level: Independent Equipment used: None                  Balance Overall balance assessment: No apparent balance deficits (not formally assessed)                                         ADL either performed or assessed with clinical judgement   ADL Overall ADL's : Needs assistance/impaired Eating/Feeding: Independent;Bed level   Grooming: Independent   Upper Body Bathing: Minimal assistance;Sitting;Standing   Lower Body Bathing: Independent   Upper Body Dressing : Moderate assistance;Standing Upper Body Dressing Details (indicate cue type and reason): mod A to don tank top, min A to thread R UE into button up shirt Lower Body Dressing: Modified independent Lower Body Dressing Details (indicate cue type and reason): With increased time patient able to don socks, shoes and sweat pants without assistance Toilet Transfer: Independent;Ambulation   Toileting- Clothing Manipulation and Hygiene: Independent;Sit to/from stand       Functional mobility during ADLs: Independent General ADL Comments: patient educated in compensatory strategies for self care tasks in order to maintain shoulder precautions      Pertinent Vitals/Pain Pain Assessment: Faces Faces Pain Scale: Hurts a little bit Pain Location: R UE Pain Descriptors / Indicators: Heaviness;Numbness Pain Intervention(s): Monitored during session     Hand Dominance Right   Extremity/Trunk Assessment Upper Extremity Assessment Upper Extremity Assessment: RUE deficits/detail RUE Deficits / Details: + nerve block   Lower Extremity Assessment Lower Extremity Assessment: Overall WFL for tasks assessed       Communication Communication Communication: No difficulties   Cognition Arousal/Alertness: Awake/alert Behavior During Therapy: WFL for tasks assessed/performed  Overall Cognitive Status: Within Functional Limits for tasks assessed                                      General Comments  VSS room air    Exercises Exercises: Shoulder   Shoulder Instructions Shoulder Instructions Donning/doffing shirt without moving shoulder: Moderate assistance;Patient able to independently direct caregiver Method for sponge bathing under operated UE: Minimal assistance;Patient able to independently direct caregiver Donning/doffing sling/immobilizer: Moderate assistance;Patient able to independently direct caregiver Correct positioning of sling/immobilizer: Minimal assistance;Patient able to independently direct caregiver Pendulum exercises (written home exercise program):  (N/A) ROM for elbow, wrist and digits of operated UE: Patient able to independently direct caregiver Sling wearing schedule (on at all times/off for ADL's): Patient able to independently direct caregiver Proper positioning of operated UE when showering: Patient able to independently direct caregiver Dressing change:  (N/A) Positioning of UE while sleeping: Patient able to independently direct caregiver    Home Living Family/patient expects to be discharged to:: Private residence Living Arrangements: Spouse/significant other;Children;Other relatives Available Help at Discharge: Family Type of Home: House Home Access: Stairs to enter [employer] of Steps: 3   Home Layout: One level     Bathroom Shower/Tub: Walk-in shower;Tub/shower unit   Bathroom Toilet: Handicapped height     Home Equipment: Environmental consultant - 2 wheels;Cane - single point;Shower seat;Bedside commode          Prior Functioning/Environment Level of Independence: Independent                 OT Problem List: Pain;Impaired UE functional use;Decreased knowledge of precautions         OT Goals(Current goals can be found in the care plan section) Acute Rehab OT Goals Patient Stated Goal: home with family OT Goal Formulation: All assessment and education complete, DC therapy   AM-PAC OT "6  Clicks" Daily Activity     Outcome Measure Help from another person eating meals?: None Help from another person taking care of personal grooming?: None Help from another person toileting, which includes using toliet, bedpan, or urinal?: None Help from another person bathing (including washing, rinsing, drying)?: A Little Help from another person to put on and taking off regular upper body clothing?: A Lot Help from another person to put on and taking off regular lower body clothing?: None 6 Click Score: 21   End of Session Equipment Utilized During Treatment: Other (comment) (sling) Nurse Communication: Other (comment) (OT complete)  Activity Tolerance: Patient tolerated treatment well Patient left: in chair;with call bell/phone within reach  OT Visit Diagnosis: Pain Pain - Right/Left: Right Pain - part of body: Shoulder                Time: 5397-6734 OT Time Calculation (min): 33 min Charges:  OT General Charges $OT Visit: 1 Visit OT Evaluation $OT Eval Low Complexity: 1 Low OT Treatments $Self Care/Home Management : 8-22 mins  Delbert Phenix OT OT pager: [place] 03/06/2021, 10:59 AM

## 2021-03-06 NOTE — Plan of Care (Signed)
  Problem: Education: Goal: Knowledge of the prescribed therapeutic regimen will improve Outcome: Progressing   Problem: Activity: Goal: Ability to tolerate increased activity will improve Outcome: Progressing   Problem: Pain Management: Goal: Pain level will decrease with appropriate interventions Outcome: Progressing   

## 2021-03-06 NOTE — Plan of Care (Signed)
Patient dc'd  

## 2021-03-09 ENCOUNTER — Telehealth: Payer: Self-pay

## 2021-03-09 NOTE — Telephone Encounter (Signed)
Called pt to check in to see how she is doing after visiting ED. Pt declined apt offer stating she felt fine. She was notified to give [place] a call if she felt the need of an apt before annual exam. Pt also stated she will follow up with Dr. Tamera Punt next week.

## 2021-03-10 ENCOUNTER — Encounter (HOSPITAL_COMMUNITY): Payer: Self-pay | Admitting: Orthopedic Surgery

## 2021-03-16 NOTE — Discharge Summary (Signed)
Patient ID: Evelyn Orr MRN: [id number] DOB/AGE: August 05, 1945 75 y.o.  Admit date: 03/05/2021 Discharge date: 03/06/2021  Admission Diagnoses:  Active Problems:   S/P reverse total shoulder arthroplasty, right   Discharge Diagnoses:  Same  Past Medical History:  Diagnosis Date   Arthritis    Breast cancer ([place])    Breast cancer of upper-outer quadrant of right female breast ([place]) 03/20/2015   Cough    Hypothyroidism    Nasal congestion    PONV (postoperative nausea and vomiting)    Rheumatoid arthritis(714.0)    Seasonal allergies    Thyroid disease    hypothyroidism    Surgeries: Procedure(s): RIGHT REVERSE SHOULDER ARTHROPLASTY on 03/05/2021   Discharged Condition: Improved  Hospital Course: Evelyn Orr is an 75 y.o. female who was admitted 03/05/2021 for operative treatment of severe end stage right shoulder osteoarthritis. Patient has severe unremitting pain that affects sleep, daily activities, and work/hobbies. After pre-op clearance the patient was taken to the operating room on 03/05/2021 and underwent  Procedure(s): RIGHT REVERSE SHOULDER ARTHROPLASTY.    Patient was given perioperative antibiotics:  Anti-infectives (From admission, onward)    Start     Dose/Rate Route Frequency Ordered Stop   03/05/21 1045  ceFAZolin (ANCEF) IVPB 2g/100 mL premix        2 g 200 mL/hr over 30 Minutes Intravenous On call to O.R. 03/05/21 1040 03/05/21 1341        Patient was given sequential compression devices, early ambulation, and chemoprophylaxis to prevent DVT.  Patient benefited maximally from hospital stay and there were no complications.      Discharge Medications:   Allergies as of 03/06/2021   No Known Allergies      Medication List     TAKE these medications    albuterol 108 (90 Base) MCG/ACT inhaler Commonly known as: VENTOLIN HFA Inhale 2 puffs into the lungs every 6 (six) hours as needed for wheezing or shortness of breath.    benzonatate 100 MG capsule Commonly known as: TESSALON Take 1 capsule (100 mg total) by mouth 2 (two) times daily as needed for cough.   COVID-19 Specimen Collection Kit See admin instructions. for testing   levothyroxine 100 MCG tablet Commonly known as: SYNTHROID Take 100 mcg by mouth See admin instructions. Monday - Friday What changed: Another medication with the same name was changed. Make sure you understand how and when to take each.   levothyroxine 88 MCG tablet Commonly known as: SYNTHROID TAKE 1 TABLET BY MOUTH EVERY DAY What changed:  when to take this additional instructions   methocarbamol 500 MG tablet Commonly known as: ROBAXIN Take 1 tablet (500 mg total) by mouth every 6 (six) hours as needed for muscle spasms.   methotrexate 2.5 MG tablet Commonly known as: RHEUMATREX Take 20 mg by mouth once a week. 8 tablets weekly   mometasone 50 MCG/ACT nasal spray Commonly known as: NASONEX Place 2 sprays into the nose daily. What changed:  when to take this reasons to take this   montelukast 10 MG tablet Commonly known as: SINGULAIR TAKE 1 TABLET BY MOUTH EVERYDAY AT BEDTIME What changed: See the new instructions.   oxyCODONE-acetaminophen 5-325 MG tablet Commonly known as: Percocet Take 1 tablet by mouth every 6 (six) hours as needed for severe pain.   sulfaSALAzine 500 MG tablet Commonly known as: AZULFIDINE Take 1,000 mg by mouth 2 (two) times daily.   triamcinolone cream 0.1 % Commonly known as: KENALOG Apply 1 application topically  daily as needed (itching).   Vitamin D (Ergocalciferol) 1.25 MG (50000 UNIT) Caps capsule Commonly known as: DRISDOL TAKE 1 CAPSULE BY MOUTH 2 TIMES EVERY WEEK        Diagnostic Studies: DG Chest 2 View  Result Date: 03/05/2021 CLINICAL DATA:  Preop for shoulder surgery. EXAM: CHEST - 2 VIEW COMPARISON:  March 01, 2018. FINDINGS: The heart size and mediastinal contours are within normal limits. Both lungs are  clear. The visualized skeletal structures are unremarkable. IMPRESSION: No active cardiopulmonary disease. Electronically Signed   By: Marijo Conception M.D.   On: 03/05/2021 11:06   DG Shoulder Right Port  Result Date: 03/05/2021 CLINICAL DATA:  Postoperative reverse shoulder arthroplasty EXAM: PORTABLE RIGHT SHOULDER COMPARISON:  03/04/2021 FINDINGS: Interval postsurgical changes from reverse right shoulder arthroplasty. Arthroplasty components are in their expected alignment. No periprosthetic fracture or evidence of other complication. Expected postoperative changes within the overlying soft tissues. IMPRESSION: Satisfactory postoperative appearance status post reverse right shoulder arthroplasty. Electronically Signed   By: Davina Poke D.O.   On: 03/05/2021 16:11    Disposition: Discharge disposition: 01-Home or Self Care       Discharge Instructions     Call MD / Call 911   Complete by: As directed    If you experience chest pain or shortness of breath, CALL 911 and be transported to the hospital emergency room.  If you develope a fever above 101 F, pus (white drainage) or increased drainage or redness at the wound, or calf pain, call your surgeon's office.   Constipation Prevention   Complete by: As directed    Drink plenty of fluids.  Prune juice may be helpful.  You may use a stool softener, such as Colace (over the counter) 100 mg twice a day.  Use MiraLax (over the counter) for constipation as needed.   Diet - low sodium heart healthy   Complete by: As directed    Discharge instructions   Complete by: As directed    Discharge Instructions after Reverse Total Shoulder Arthroplasty    A sling has been provided for you. You are to wear this at all times (except for bathing and dressing), until your first post operative visit with Dr. Tamera Punt. Please also wear while sleeping at night. While you bath and dress, let the arm/elbow extend straight down to stretch your elbow.  Wiggle your fingers and pump your first while your in the sling to prevent hand swelling.  Use ice on the shoulder intermittently over the first 48 hours after surgery. Continue to use ice or and ice machine as needed after 48 hours for pain control/swelling.   Pain medicine has been prescribed for you.   Use your medicine liberally over the first 48 hours, and then you can begin to taper your use. You may take Extra Strength Tylenol or Tylenol only in place of the pain pills. DO NOT take ANY nonsteroidal anti-inflammatory pain medications: Advil, Motrin, Ibuprofen, Aleve, Naproxen or Naprosyn.   Take one aspirin 43m a day for 2 weeks after surgery, unless you have an aspirin sensitivity/allergy or asthma.   Leave your dressing on until your first follow up visit.  You may shower with the dressing.  Hold your arm as if you still have your sling on while you shower.  Simply allow the water to wash over the site and then pat dry. Make sure your axilla (armpit) is completely dry after showering.    Please call [phone] normal business  hours or [phone] after hours for any problems. Including the following:  - excessive redness of the incisions - drainage for more than 4 days - fever of more than 101.5 F  *Please note that pain medications will not be refilled after hours or on weekends.  Dental Antibiotics:  In most cases prophylactic antibiotics for Dental procdeures after total joint surgery are not necessary.  Exceptions are as follows:  1. History of prior total joint infection  2. Severely immunocompromised (Organ Transplant, cancer chemotherapy, Rheumatoid biologic meds such as [place])  3. Poorly controlled diabetes (A1C &gt; 8.0, blood glucose over 200)  If you have one of these conditions, contact your surgeon for an antibiotic prescription, prior to your dental procedure.   Post-operative opioid taper instructions:   Complete by: As directed     POST-OPERATIVE OPIOID TAPER INSTRUCTIONS: It is important to wean off of your opioid medication as soon as possible. If you do not need pain medication after your surgery it is ok to stop day one. Opioids include: Codeine, Hydrocodone(Norco, Vicodin), Oxycodone(Percocet, oxycontin) and hydromorphone amongst others.  Long term and even short term use of opiods can cause: Increased pain response Dependence Constipation Depression Respiratory depression And more.  Withdrawal symptoms can include Flu like symptoms Nausea, vomiting And more Techniques to manage these symptoms Hydrate well Eat regular healthy meals Stay active Use relaxation techniques(deep breathing, meditating, yoga) Do Not substitute Alcohol to help with tapering If you have been on opioids for less than two weeks and do not have pain than it is ok to stop all together.  Plan to wean off of opioids This plan should start within one week post op of your joint replacement. Maintain the same interval or time between taking each dose and first decrease the dose.  Cut the total daily intake of opioids by one tablet each day Next start to increase the time between doses. The last dose that should be eliminated is the evening dose.           Follow-up Information     Tania Ade, MD. Schedule an appointment as soon as possible for a visit in 2 week(s).   Specialty: Orthopedic Surgery Contact information: [name] [place] [address] [phone]                  Signed: Luetta Nutting L. [place], PA-C 03/16/2021, 12:59 PM

## 2021-03-18 DIAGNOSIS — Z96611 Presence of right artificial shoulder joint: Secondary | ICD-10-CM | POA: Diagnosis not present

## 2021-03-18 DIAGNOSIS — Z471 Aftercare following joint replacement surgery: Secondary | ICD-10-CM | POA: Diagnosis not present

## 2021-03-24 DIAGNOSIS — M0589 Other rheumatoid arthritis with rheumatoid factor of multiple sites: Secondary | ICD-10-CM | POA: Diagnosis not present

## 2021-03-24 DIAGNOSIS — M8580 Other specified disorders of bone density and structure, unspecified site: Secondary | ICD-10-CM | POA: Diagnosis not present

## 2021-03-24 DIAGNOSIS — M79643 Pain in unspecified hand: Secondary | ICD-10-CM | POA: Diagnosis not present

## 2021-03-24 DIAGNOSIS — Z79899 Other long term (current) drug therapy: Secondary | ICD-10-CM | POA: Diagnosis not present

## 2021-03-24 DIAGNOSIS — M1990 Unspecified osteoarthritis, unspecified site: Secondary | ICD-10-CM | POA: Diagnosis not present

## 2021-04-02 ENCOUNTER — Other Ambulatory Visit: Payer: Self-pay

## 2021-04-02 ENCOUNTER — Ambulatory Visit (AMBULATORY_SURGERY_CENTER): Payer: 59

## 2021-04-02 VITALS — Ht 61.0 in | Wt 157.0 lb

## 2021-04-02 DIAGNOSIS — Z86010 Personal history of colonic polyps: Secondary | ICD-10-CM

## 2021-04-02 MED ORDER — PEG 3350-KCL-NA BICARB-NACL 420 G PO SOLR: 4000.0000 mL | Freq: Once | ORAL | 0 refills | Status: AC

## 2021-04-02 NOTE — Progress Notes (Signed)
Pre visit completed via phone call; Patient verified name, DOB, and address; No egg or soy allergy known to patient  No issues known to pt with past sedation with any surgeries or procedures Patient denies ever being told they had issues or difficulty with intubation  No FH of Malignant Hyperthermia Pt is not on diet pills Pt is not on home 02  Pt is not on blood thinners  Pt denies issues with constipation at this time;  No A fib or A flutter Pt is fully vaccinated for Covid x 2; NO PA's for preps discussed with pt in [facility] today  Discussed with pt there will be an out-of-pocket cost for prep and that varies from $0 to 70 +  dollars - pt verbalized understanding  Due to the COVID-19 pandemic we are asking patients to follow certain guidelines in [facility] and the [facility]   Pt aware of COVID protocols and [facility] guidelines   

## 2021-04-08 DIAGNOSIS — Z853 Personal history of malignant neoplasm of breast: Secondary | ICD-10-CM | POA: Diagnosis not present

## 2021-04-08 DIAGNOSIS — M8589 Other specified disorders of bone density and structure, multiple sites: Secondary | ICD-10-CM | POA: Diagnosis not present

## 2021-04-08 LAB — HM DEXA SCAN

## 2021-04-08 LAB — HM MAMMOGRAPHY

## 2021-04-09 ENCOUNTER — Encounter: Payer: Self-pay | Admitting: Internal Medicine

## 2021-04-10 ENCOUNTER — Encounter: Payer: Self-pay | Admitting: Internal Medicine

## 2021-04-10 ENCOUNTER — Telehealth: Payer: Self-pay | Admitting: Gastroenterology

## 2021-04-10 NOTE — Telephone Encounter (Signed)
Pt states she cannot drink Golytely- states she told the [facility] nurse this and was prescribed any ay  Pt states she cannot drink Miralax because she gets very nauseated with Gatorade  We discussed prep options and she will try Plenvu- I will provide her a sample and new instructions- she will pick up the Plenvu Monday 11-14 2nd floor desk   All questions answered today- pt declined Zofran for nausea to take with her prep   I called [facility] and cancelled her Golytely for her colon

## 2021-04-10 NOTE — Telephone Encounter (Signed)
Called pt- no answer- [facility]

## 2021-04-15 ENCOUNTER — Encounter: Payer: Self-pay | Admitting: Certified Registered Nurse Anesthetist

## 2021-04-15 DIAGNOSIS — M19011 Primary osteoarthritis, right shoulder: Secondary | ICD-10-CM | POA: Diagnosis not present

## 2021-04-16 ENCOUNTER — Encounter: Payer: Self-pay | Admitting: Gastroenterology

## 2021-04-16 ENCOUNTER — Other Ambulatory Visit: Payer: Self-pay

## 2021-04-16 ENCOUNTER — Encounter: Payer: Medicare Other | Admitting: Gastroenterology

## 2021-04-16 ENCOUNTER — Ambulatory Visit (AMBULATORY_SURGERY_CENTER): Payer: Medicare Other | Admitting: Gastroenterology

## 2021-04-16 VITALS — BP 116/70 | HR 77 | Temp 97.8°F | Resp 22 | Ht 61.0 in | Wt 157.0 lb

## 2021-04-16 DIAGNOSIS — Z86010 Personal history of colonic polyps: Secondary | ICD-10-CM | POA: Diagnosis not present

## 2021-04-16 DIAGNOSIS — D120 Benign neoplasm of cecum: Secondary | ICD-10-CM | POA: Diagnosis not present

## 2021-04-16 DIAGNOSIS — D122 Benign neoplasm of ascending colon: Secondary | ICD-10-CM

## 2021-04-16 DIAGNOSIS — D123 Benign neoplasm of transverse colon: Secondary | ICD-10-CM

## 2021-04-16 DIAGNOSIS — E039 Hypothyroidism, unspecified: Secondary | ICD-10-CM | POA: Diagnosis not present

## 2021-04-16 DIAGNOSIS — Z1211 Encounter for screening for malignant neoplasm of colon: Secondary | ICD-10-CM | POA: Diagnosis not present

## 2021-04-16 MED ORDER — SODIUM CHLORIDE 0.9 % IV SOLN: 500.0000 mL | Freq: Once | INTRAVENOUS | Status: DC

## 2021-04-16 NOTE — Op Note (Signed)
[facility] Patient Name: Evelyn Orr Procedure Date: 04/16/2021 9:27 AM MRN: [id number] Endoscopist: Mauri Pole , MD Age: 76 Referring MD:  Date of Birth: 04-18-1946 Gender: Female Account #: [id number] Procedure:                Colonoscopy Indications:              High risk colon cancer surveillance: Personal                            history of colonic polyps, High risk colon cancer                            surveillance: Personal history of adenoma less than                            10 mm in size Medicines:                Monitored Anesthesia Care Procedure:                Pre-Anesthesia Assessment:                           - Prior to the procedure, a History and Physical                            was performed, and patient medications and                            allergies were reviewed. The patient's tolerance of                            previous anesthesia was also reviewed. The risks                            and benefits of the procedure and the sedation                            options and risks were discussed with the patient.                            All questions were answered, and informed consent                            was obtained. Prior Anticoagulants: The patient has                            taken no previous anticoagulant or antiplatelet                            agents. ASA Grade Assessment: II - A patient with                            mild systemic disease. After reviewing the risks  and benefits, the patient was deemed in                            satisfactory condition to undergo the procedure.                           After obtaining informed consent, the colonoscope                            was passed under direct vision. Throughout the                            procedure, the patient's blood pressure, pulse, and                            oxygen saturations were monitored  continuously. The                            PCF-HQ190L Colonoscope was introduced through the                            anus and advanced to the the cecum, identified by                            appendiceal orifice and ileocecal valve. The                            colonoscopy was performed without difficulty. The                            patient tolerated the procedure well. The quality                            of the bowel preparation was excellent. The                            ileocecal valve, appendiceal orifice, and rectum                            were photographed. Scope In: 9:36:34 AM Scope Out: 9:49:45 AM Scope Withdrawal Time: 0 hours 10 minutes 16 seconds  Total Procedure Duration: 0 hours 13 minutes 11 seconds  Findings:                 The perianal and digital rectal examinations were                            normal.                           A 1 mm polyp was found in the ascending colon. The                            polyp was sessile. The polyp was removed with a  cold biopsy forceps. Resection and retrieval were                            complete.                           Two sessile polyps were found in the transverse                            colon and cecum. The polyps were 4 to 7 mm in size.                            These polyps were removed with a cold snare.                            Resection and retrieval were complete.                           Scattered small-mouthed diverticula were found in                            the sigmoid colon and descending colon.                           Non-bleeding external and internal hemorrhoids were                            found during retroflexion. The hemorrhoids were                            small. Complications:            No immediate complications. Estimated Blood Loss:     Estimated blood loss was minimal. Impression:               - One 1 mm polyp in the ascending  colon, removed                            with a cold biopsy forceps. Resected and retrieved.                           - Two 4 to 7 mm polyps in the transverse colon and                            in the cecum, removed with a cold snare. Resected                            and retrieved.                           - Diverticulosis in the sigmoid colon and in the                            descending colon.                           -  Non-bleeding external and internal hemorrhoids. Recommendation:           - Patient has a contact number available for                            emergencies. The signs and symptoms of potential                            delayed complications were discussed with the                            patient. Return to normal activities tomorrow.                            Written discharge instructions were provided to the                            patient.                           - Resume previous diet.                           - Continue present medications.                           - Await pathology results.                           - Repeat colonoscopy in 3-10 years for surveillance                            based on pathology results. Mauri Pole, MD 04/16/2021 9:56:45 AM This report has been signed electronically.

## 2021-04-16 NOTE — Progress Notes (Signed)
Pt's states no medical or surgical changes since previsit or office visit. VS assessed by C.W 

## 2021-04-16 NOTE — Progress Notes (Signed)
Called to room to assist during endoscopic procedure.  Patient ID and intended procedure confirmed with present staff. Received instructions for my participation in the procedure from the performing physician.  

## 2021-04-16 NOTE — Progress Notes (Signed)
[facility] History and Physical   Primary Care Physician:  [name], MD   Reason for Procedure:  History of adenomatous colon polyps  Plan:    Surveillance colonoscopy with possible interventions as needed     HPI: Evelyn Orr is a very pleasant 75 y.o. femalehere for surveillance colonoscopy. Denies any nausea, vomiting, abdominal pain, melena or bright red blood per rectum  The risks and benefits as well as alternatives of endoscopic procedure(s) have been discussed and reviewed. All questions answered. The patient agrees to proceed.    Past Medical History:  Diagnosis Date   Arthritis    generalized   Breast cancer ([place])    Breast cancer of upper-outer quadrant of right female breast ([place]) 03/20/2015   Cough    GERD (gastroesophageal reflux disease)    uses PRN meds/with certain foods   Hypothyroidism    Nasal congestion    PONV (postoperative nausea and vomiting)    Rheumatoid arthritis(714.0)    Seasonal allergies    Seasonal allergies    Thyroid disease    hypothyroidism    Past Surgical History:  Procedure Laterality Date   BREAST LUMPECTOMY WITH RADIOACTIVE SEED LOCALIZATION Right 04/09/2015   Procedure: BREAST LUMPECTOMY WITH RADIOACTIVE SEED LOCALIZATION;  Surgeon: Rolm Bookbinder, MD;  Location: [facility];  Service: General;  Laterality: Right;   BREAST REDUCTION SURGERY  2001   CALCANEAL OSTEOTOMY Left 10/11/2013   Procedure: LEFT CALCANEAL OSTEOTOMY;  Surgeon: Wylene Simmer, MD;  Location: [facility];  Service: Orthopedics;  Laterality: Left;   CARPAL TUNNEL RELEASE     left   CERVICAL FUSION  2009   COLONOSCOPY     2002,2005,2008 w/[facility]   COLONOSCOPY  2015   [facility]-MAC-prep good-TA- recall 5 yrs   GASTROCNEMIUS RECESSION Left 10/11/2013   Procedure: LEFT GASTROC RECESSION; LEFT POSTERIOR TIBIAL TENOLYSIS;  Surgeon: Wylene Simmer, MD;  Location: [facility];  Service:  Orthopedics;  Laterality: Left;   KNEE ARTHROSCOPY  2004   right   POLYPECTOMY     REMOVAL OF IMPLANT Left 02/14/2014   Procedure: LEFT FOOT REMOVAL OF DEEP IMPLANT;  Surgeon: Wylene Simmer, MD;  Location: [facility];  Service: Orthopedics;  Laterality: Left;   REVERSE SHOULDER ARTHROPLASTY Right 03/05/2021   Procedure: REVERSE SHOULDER ARTHROPLASTY;  Surgeon: Tania Ade, MD;  Location: [facility];  Service: Orthopedics;  Laterality: Right;   SHOULDER ARTHROSCOPY W/ ROTATOR CUFF REPAIR  2006   right   THYROIDECTOMY     age 69   TONSILLECTOMY     TOTAL KNEE ARTHROPLASTY Right 06/21/2016   Procedure: RIGHT TOTAL KNEE ARTHROPLASTY;  Surgeon: Paralee Cancel, MD;  Location: [facility];  Service: Orthopedics;  Laterality: Right;   TUBAL LIGATION     WRIST ARTHRODESIS  2003   left     Prior to Admission medications   Medication Sig Start Date End Date Taking? Authorizing Provider  levothyroxine (SYNTHROID) 100 MCG tablet Take 100 mcg by mouth See admin instructions. On Saturdays and Sundays   Yes [provider]  levothyroxine (SYNTHROID) 88 MCG tablet TAKE 1 TABLET BY MOUTH EVERY DAY Patient taking differently: Take 88 mcg by mouth See admin instructions. Monday-Friday 02/08/20  Yes [name], MD  methotrexate (RHEUMATREX) 2.5 MG tablet Take 20 mg by mouth once a week. 8 tablets weekly 11/22/17  Yes [provider]  sulfaSALAzine (AZULFIDINE) 500 MG tablet Take 1,000 mg by mouth 2 (two) times daily. 10/23/19  Yes  [provider]  albuterol (VENTOLIN HFA) 108 (90 Base) MCG/ACT inhaler Inhale 2 puffs into the lungs every 6 (six) hours as needed for wheezing or shortness of breath. Patient not taking: Reported on 04/16/2021 09/04/19   [name], MD  benzonatate (TESSALON) 100 MG capsule Take 1 capsule (100 mg total) by mouth 2 (two) times daily as needed for cough. Patient not taking: Reported on 04/16/2021 09/04/20   [name], MD  mometasone  (NASONEX) 50 MCG/ACT nasal spray Place 2 sprays into the nose daily. Patient not taking: Reported on 04/16/2021 09/04/19   [name], MD  triamcinolone (KENALOG) 0.1 % Apply 1 application topically daily as needed (itching). Patient not taking: Reported on 04/16/2021    [provider]    Current Outpatient Medications  Medication Sig Dispense Refill   levothyroxine (SYNTHROID) 100 MCG tablet Take 100 mcg by mouth See admin instructions. On Saturdays and Sundays     levothyroxine (SYNTHROID) 88 MCG tablet TAKE 1 TABLET BY MOUTH EVERY DAY (Patient taking differently: Take 88 mcg by mouth See admin instructions. Monday-Friday) 90 tablet 2   methotrexate (RHEUMATREX) 2.5 MG tablet Take 20 mg by mouth once a week. 8 tablets weekly  1   sulfaSALAzine (AZULFIDINE) 500 MG tablet Take 1,000 mg by mouth 2 (two) times daily.     albuterol (VENTOLIN HFA) 108 (90 Base) MCG/ACT inhaler Inhale 2 puffs into the lungs every 6 (six) hours as needed for wheezing or shortness of breath. (Patient not taking: Reported on 04/16/2021) 18 g 1   benzonatate (TESSALON) 100 MG capsule Take 1 capsule (100 mg total) by mouth 2 (two) times daily as needed for cough. (Patient not taking: Reported on 04/16/2021) 30 capsule 1   mometasone (NASONEX) 50 MCG/ACT nasal spray Place 2 sprays into the nose daily. (Patient not taking: Reported on 04/16/2021) 17 g 2   triamcinolone (KENALOG) 0.1 % Apply 1 application topically daily as needed (itching). (Patient not taking: Reported on 04/16/2021)     Current Facility-Administered Medications  Medication Dose Route Frequency Provider Last Rate Last Admin   0.9 %  sodium chloride infusion  500 mL Intravenous Once Mauri Pole, MD        Allergies as of 04/16/2021   (No Known Allergies)    Family History  Problem Relation Age of Onset   Colon polyps Mother    Hypertension Mother    Heart disease Father    Heart attack Father    Other Sister        high  breast density   Colon polyps Brother    Prostate cancer Brother 48       unknown Gleason   Colon polyps Brother    Prostate cancer Maternal Uncle    Prostate cancer Maternal Uncle    Prostate cancer Maternal Uncle    Breast cancer Paternal Aunt        dx. 23 or younger   Colon cancer Paternal Uncle    Prostate cancer Maternal Grandfather        dx. older age   Colon polyps Son 76       colonoscopy for unspecified reason; found some colon polyps, has not had one since   Breast cancer Cousin        4 maternal cousin had breast cancer    Colon cancer Other    Cancer Other        MGM's sister   Rectal cancer Neg Hx    Stomach cancer  Neg Hx    Esophageal cancer Neg Hx     Social History   Socioeconomic History   Marital status: Married    Spouse name: Not on file   Number of children: Not on file   Years of education: Not on file   Highest education level: Not on file  Occupational History   Occupation: retired  Tobacco Use   Smoking status: Former    Packs/day: 0.25    Years: 21.00    Pack years: 5.25    Types: Cigarettes    Quit date: 02/13/1984    Years since quitting: 37.1   Smokeless tobacco: Never  Vaping Use   Vaping Use: Never used  Substance and Sexual Activity   Alcohol use: No   Drug use: No   Sexual activity: Yes    Birth control/protection: Post-menopausal  Other Topics Concern   Not on file  Social History Narrative   Not on file   Social Determinants of Health   Financial Resource Strain: Low Risk    Difficulty of Paying Living Expenses: Not hard at all  Food Insecurity: No Food Insecurity   Worried About Charity fundraiser in the Last Year: Never true   [place] in the Last Year: Never true  Transportation Needs: No Transportation Needs   Lack of Transportation (Medical): No   Lack of Transportation (Non-Medical): No  Physical Activity: Inactive   Days of Exercise per Week: 0 days   Minutes of Exercise per Session: 0 min  Stress:  No Stress Concern Present   Feeling of Stress : Not at all  Social Connections: Not on file  Intimate Partner Violence: Not on file    Review of Systems:  All other review of systems negative except as mentioned in the HPI.  Physical Exam: Vital signs in last 24 hours: BP (!) 148/93   Pulse 96   Temp 97.8 F (36.6 C) (Skin)   Ht _0  (1.549 m)   Wt 157 lb (71.2 kg)   SpO2 98%   BMI 29.66 kg/m     General:   Alert, NAD Lungs:  Clear .   Heart:  Regular rate and rhythm Abdomen:  Soft, nontender and nondistended. Neuro/Psych:  Alert and cooperative. Normal mood and affect. A and O x 3  Reviewed labs, radiology imaging, old records and pertinent past GI work up  Patient is appropriate for planned procedure(s) and anesthesia in an ambulatory setting   K. Denzil Magnuson , MD [phone]

## 2021-04-16 NOTE — Patient Instructions (Signed)
Handout on polyps and diverticulosis given    YOU HAD AN ENDOSCOPIC PROCEDURE TODAY AT THE [facility]:   Refer to the procedure report that was given to you for any specific questions about what was found during the examination.  If the procedure report does not answer your questions, please call your gastroenterologist to clarify.  If you requested that your care partner not be given the details of your procedure findings, then the procedure report has been included in a sealed envelope for you to review at your convenience later.  YOU SHOULD EXPECT: Some feelings of bloating in the abdomen. Passage of more gas than usual.  Walking can help get rid of the air that was put into your GI tract during the procedure and reduce the bloating. If you had a lower endoscopy (such as a colonoscopy or flexible sigmoidoscopy) you may notice spotting of blood in your stool or on the toilet paper. If you underwent a bowel prep for your procedure, you may not have a normal bowel movement for a few days.  Please Note:  You might notice some irritation and congestion in your nose or some drainage.  This is from the oxygen used during your procedure.  There is no need for concern and it should clear up in a day or so.  SYMPTOMS TO REPORT IMMEDIATELY:   Following lower endoscopy (colonoscopy or flexible sigmoidoscopy):  Excessive amounts of blood in the stool  Significant tenderness or worsening of abdominal pains  Swelling of the abdomen that is new, acute  Fever of 100F or higher   For urgent or emergent issues, a gastroenterologist can be reached at any hour by calling [phone]. Do not use MyChart messaging for urgent concerns.    DIET:  We do recommend a small meal at first, but then you may proceed to your regular diet.  Drink plenty of fluids but you should avoid alcoholic beverages for 24 hours.  ACTIVITY:  You should plan to take it easy for the rest of today and you should NOT  DRIVE or use heavy machinery until tomorrow (because of the sedation medicines used during the test).    FOLLOW UP: Our staff will call the number listed on your records 48-72 hours following your procedure to check on you and address any questions or concerns that you may have regarding the information given to you following your procedure. If we do not reach you, we will leave a message.  We will attempt to reach you two times.  During this call, we will ask if you have developed any symptoms of COVID 19. If you develop any symptoms (ie: fever, flu-like symptoms, shortness of breath, cough etc.) before then, please call [phone].  If you test positive for Covid 19 in the 2 weeks post procedure, please call and report this information to us.    If any biopsies were taken you will be contacted by phone or by letter within the next 1-3 weeks.  Please call us at [phone] if you have not heard about the biopsies in 3 weeks.    SIGNATURES/CONFIDENTIALITY: You and/or your care partner have signed paperwork which will be entered into your electronic medical record.  These signatures attest to the fact that that the information above on your After Visit Summary has been reviewed and is understood.  Full responsibility of the confidentiality of this discharge information lies with you and/or your care-partner. 

## 2021-04-16 NOTE — Progress Notes (Signed)
PT taken to PACU. Monitors in place. VSS. Report given to RN. 

## 2021-04-20 ENCOUNTER — Encounter: Payer: Self-pay | Admitting: Internal Medicine

## 2021-04-20 ENCOUNTER — Telehealth: Payer: Self-pay | Admitting: *Deleted

## 2021-04-20 NOTE — Telephone Encounter (Signed)
  Follow up Call-  Call back number 04/16/2021  Post procedure Call Back phone  # [phone]  Permission to leave phone message Yes  Some recent data might be hidden     Patient questions:  Do you have a fever, pain , or abdominal swelling? No. Pain Score  0 *  Have you tolerated food without any problems? Yes.    Have you been able to return to your normal activities? Yes.    Do you have any questions about your discharge instructions: Diet   No. Medications  No. Follow up visit  No.  Do you have questions or concerns about your Care? No.  Actions: * If pain score is 4 or above: No action needed, pain <4.  Have you developed a fever since your procedure? no  2.   Have you had an respiratory symptoms (SOB or cough) since your procedure? no  3.   Have you tested positive for COVID 19 since your procedure no  4.   Have you had any family members/close contacts diagnosed with the COVID 19 since your procedure?  no   If yes to any of these questions please route to Joylene John, RN and Joella Prince, RN

## 2021-04-21 DIAGNOSIS — M25611 Stiffness of right shoulder, not elsewhere classified: Secondary | ICD-10-CM | POA: Diagnosis not present

## 2021-04-21 DIAGNOSIS — R531 Weakness: Secondary | ICD-10-CM | POA: Diagnosis not present

## 2021-04-21 DIAGNOSIS — Z96611 Presence of right artificial shoulder joint: Secondary | ICD-10-CM | POA: Diagnosis not present

## 2021-04-22 ENCOUNTER — Other Ambulatory Visit: Payer: Self-pay

## 2021-04-22 ENCOUNTER — Encounter: Payer: Self-pay | Admitting: Podiatry

## 2021-04-22 ENCOUNTER — Ambulatory Visit (INDEPENDENT_AMBULATORY_CARE_PROVIDER_SITE_OTHER): Payer: Medicare Other | Admitting: Podiatry

## 2021-04-22 DIAGNOSIS — M79672 Pain in left foot: Secondary | ICD-10-CM

## 2021-04-22 DIAGNOSIS — L84 Corns and callosities: Secondary | ICD-10-CM | POA: Diagnosis not present

## 2021-04-25 NOTE — Progress Notes (Signed)
  Subjective:  Patient ID: Evelyn Orr, female    DOB: Feb 28, 1946,  MRN: [id number]  [place] presents to clinic today for callus(es) plantar aspect of left foot. Aggravating factors include weightbearing with and without shoe gear. Pain is relieved with periodic professional debridement.  Patient voices no new pedal problems on today's visit. She is recovering from shoulder surgery of the right UE.  PCP is [name], MD , and last visit was 04/23/2020.  No Known Allergies  Review of Systems: Negative except as noted in the HPI. Objective:   Constitutional Evelyn Orr is a pleasant 75 y.o. African American female, WD, WN in NAD. AAO x 3.   Vascular CFT immediate b/l LE. Palpable DP/PT pulses b/l LE. Digital hair present b/l. Skin temperature gradient WNL b/l. No pain with calf compression b/l. No edema noted b/l. No cyanosis or clubbing noted b/l LE.  Neurologic Normal speech. Oriented to person, place, and time. Protective sensation intact 5/5 intact bilaterally with 10g monofilament b/l. Vibratory sensation intact b/l.  Dermatologic Pedal integument with normal turgor, texture and tone BLE. No open wounds b/l LE. Toenails 1-5 bilaterally well maintained with adequate length. No erythema, no edema, no drainage, no fluctuance. Hyperkeratotic lesion(s) submet head 5 left foot.  No erythema, no edema, no drainage, no fluctuance.  Orthopedic: Normal muscle strength 5/5 to all lower extremity muscle groups bilaterally. No pain, crepitus or joint limitation noted with ROM b/l LE. No gross bony pedal deformities b/l. Patient ambulates independently without assistive aids.   Radiographs: None Assessment:   1. Callus   2. Left foot pain    Plan:  Patient was evaluated and treated and all questions answered. Consent given for treatment as described below: -Medicare ABN signed for this year. Patient consents for services of paring of callus  today. Copy has been  placed in patient's chart. -Callus(es) submet head 5 left foot pared utilizing sterile scalpel blade without complication or incident. Total number debrided =1. -Patient/POA to call should there be question/concern in the interim.  Return in about 3 months (around 07/23/2021).  Marzetta Board, DPM

## 2021-04-27 DIAGNOSIS — Z96611 Presence of right artificial shoulder joint: Secondary | ICD-10-CM | POA: Diagnosis not present

## 2021-04-27 DIAGNOSIS — M25611 Stiffness of right shoulder, not elsewhere classified: Secondary | ICD-10-CM | POA: Diagnosis not present

## 2021-04-27 DIAGNOSIS — R531 Weakness: Secondary | ICD-10-CM | POA: Diagnosis not present

## 2021-04-29 DIAGNOSIS — R531 Weakness: Secondary | ICD-10-CM | POA: Diagnosis not present

## 2021-04-29 DIAGNOSIS — Z96611 Presence of right artificial shoulder joint: Secondary | ICD-10-CM | POA: Diagnosis not present

## 2021-04-29 DIAGNOSIS — M25611 Stiffness of right shoulder, not elsewhere classified: Secondary | ICD-10-CM | POA: Diagnosis not present

## 2021-05-01 ENCOUNTER — Encounter: Payer: Self-pay | Admitting: Gastroenterology

## 2021-05-04 ENCOUNTER — Other Ambulatory Visit: Payer: Self-pay

## 2021-05-04 ENCOUNTER — Other Ambulatory Visit: Payer: 59

## 2021-05-04 DIAGNOSIS — Z1152 Encounter for screening for COVID-19: Secondary | ICD-10-CM | POA: Diagnosis not present

## 2021-05-04 DIAGNOSIS — R531 Weakness: Secondary | ICD-10-CM | POA: Diagnosis not present

## 2021-05-04 DIAGNOSIS — E038 Other specified hypothyroidism: Secondary | ICD-10-CM

## 2021-05-04 DIAGNOSIS — E039 Hypothyroidism, unspecified: Secondary | ICD-10-CM

## 2021-05-04 DIAGNOSIS — M25611 Stiffness of right shoulder, not elsewhere classified: Secondary | ICD-10-CM | POA: Diagnosis not present

## 2021-05-04 DIAGNOSIS — Z96611 Presence of right artificial shoulder joint: Secondary | ICD-10-CM | POA: Diagnosis not present

## 2021-05-05 LAB — TSH+FREE T4
Free T4: 1.06 ng/dL (ref 0.82–1.77)
TSH: 4.63 u[IU]/mL — ABNORMAL HIGH (ref 0.450–4.500)

## 2021-05-06 DIAGNOSIS — M25611 Stiffness of right shoulder, not elsewhere classified: Secondary | ICD-10-CM | POA: Diagnosis not present

## 2021-05-06 DIAGNOSIS — Z96611 Presence of right artificial shoulder joint: Secondary | ICD-10-CM | POA: Diagnosis not present

## 2021-05-06 DIAGNOSIS — R531 Weakness: Secondary | ICD-10-CM | POA: Diagnosis not present

## 2021-05-07 ENCOUNTER — Encounter: Payer: Self-pay | Admitting: Internal Medicine

## 2021-05-07 ENCOUNTER — Other Ambulatory Visit: Payer: Self-pay

## 2021-05-07 ENCOUNTER — Ambulatory Visit: Payer: 59

## 2021-05-07 ENCOUNTER — Ambulatory Visit (INDEPENDENT_AMBULATORY_CARE_PROVIDER_SITE_OTHER): Payer: Medicare Other | Admitting: Internal Medicine

## 2021-05-07 VITALS — BP 126/80 | HR 90 | Temp 99.1°F | Ht 61.4 in | Wt 158.4 lb

## 2021-05-07 DIAGNOSIS — Z2821 Immunization not carried out because of patient refusal: Secondary | ICD-10-CM

## 2021-05-07 DIAGNOSIS — Z0000 Encounter for general adult medical examination without abnormal findings: Secondary | ICD-10-CM | POA: Diagnosis not present

## 2021-05-07 DIAGNOSIS — R3121 Asymptomatic microscopic hematuria: Secondary | ICD-10-CM

## 2021-05-07 DIAGNOSIS — E039 Hypothyroidism, unspecified: Secondary | ICD-10-CM

## 2021-05-07 DIAGNOSIS — R0982 Postnasal drip: Secondary | ICD-10-CM | POA: Diagnosis not present

## 2021-05-07 DIAGNOSIS — E663 Overweight: Secondary | ICD-10-CM

## 2021-05-07 LAB — CMP14+EGFR
ALT: 18 IU/L (ref 0–32)
AST: 35 IU/L (ref 0–40)
Albumin/Globulin Ratio: 1.4 (ref 1.2–2.2)
Albumin: 4.7 g/dL (ref 3.7–4.7)
Alkaline Phosphatase: 159 IU/L — ABNORMAL HIGH (ref 44–121)
BUN/Creatinine Ratio: 8 — ABNORMAL LOW (ref 12–28)
BUN: 6 mg/dL — ABNORMAL LOW (ref 8–27)
Bilirubin Total: 0.3 mg/dL (ref 0.0–1.2)
CO2: 23 mmol/L (ref 20–29)
Calcium: 9.8 mg/dL (ref 8.7–10.3)
Chloride: 102 mmol/L (ref 96–106)
Creatinine, Ser: 0.74 mg/dL (ref 0.57–1.00)
Globulin, Total: 3.4 g/dL (ref 1.5–4.5)
Glucose: 71 mg/dL (ref 70–99)
Potassium: 3.8 mmol/L (ref 3.5–5.2)
Sodium: 142 mmol/L (ref 134–144)
Total Protein: 8.1 g/dL (ref 6.0–8.5)
eGFR: 85 mL/min/{1.73_m2} (ref >59)

## 2021-05-07 LAB — CBC
Hematocrit: 35.3 % (ref 34.0–46.6)
Hemoglobin: 11.6 g/dL (ref 11.1–15.9)
MCH: 30.3 pg (ref 26.6–33.0)
MCHC: 32.9 g/dL (ref 31.5–35.7)
MCV: 92 fL (ref 79–97)
Platelets: 270 10*3/uL (ref 150–450)
RBC: 3.83 x10E6/uL (ref 3.77–5.28)
RDW: 13.3 % (ref 11.7–15.4)
WBC: 5.8 10*3/uL (ref 3.4–10.8)

## 2021-05-07 LAB — POCT URINALYSIS DIPSTICK
Bilirubin, UA: NEGATIVE
Glucose, UA: NEGATIVE
Ketones, UA: NEGATIVE
Leukocytes, UA: NEGATIVE
Nitrite, UA: NEGATIVE
Protein, UA: NEGATIVE
Spec Grav, UA: <=1.005 — AB (ref 1.010–1.025)
Urobilinogen, UA: 0.2 E.U./dL
pH, UA: 5.5 (ref 5.0–8.0)

## 2021-05-07 LAB — LIPID PANEL
Chol/HDL Ratio: 3.2 ratio (ref 0.0–4.4)
Cholesterol, Total: 206 mg/dL — ABNORMAL HIGH (ref 100–199)
HDL: 65 mg/dL (ref >39)
LDL Chol Calc (NIH): 122 mg/dL — ABNORMAL HIGH (ref 0–99)
Triglycerides: 110 mg/dL (ref 0–149)
VLDL Cholesterol Cal: 19 mg/dL (ref 5–40)

## 2021-05-07 NOTE — Progress Notes (Signed)
I,Katawbba Wiggins,acting as a scribe for  N , MD.,have documented all relevant documentation on the behalf of  N , MD,as directed by   N , MD while in the presence of  N , MD.  This visit occurred during the SARS-CoV-2 public health emergency.  Safety protocols were in place, including screening questions prior to the visit, additional usage of staff PPE, and extensive cleaning of exam room while observing appropriate contact time as indicated for disinfecting solutions.  Subjective:     Patient ID: Evelyn Orr , female    DOB: 03/13/1946 , 75 y.o.   MRN: [id number]   Chief Complaint  Patient presents with   Annual Exam    HPI  She is here today for a full physical examination.  She is followed by Elmira Powell, PA for her GYN exam. Last pap was 2021. She reports compliance with meds. Denies headaches, chest pain and shortness of breath.   Thyroid Problem Presents for follow-up visit. Patient reports no constipation, heat intolerance, hoarse voice, leg swelling, palpitations, tremors or visual change. The symptoms have been stable.    Past Medical History:  Diagnosis Date   Arthritis    generalized   Breast cancer (HCC)    Breast cancer of upper-outer quadrant of right female breast (HCC) 03/20/2015   Cough    GERD (gastroesophageal reflux disease)    uses PRN meds/with certain foods   Hypothyroidism    Nasal congestion    PONV (postoperative nausea and vomiting)    Rheumatoid arthritis(714.0)    Seasonal allergies    Seasonal allergies    Thyroid disease    hypothyroidism     Family History  Problem Relation Age of Onset   Colon polyps Mother    Hypertension Mother    Heart disease Father    Heart attack Father    Other Sister        high breast density   Colon polyps Brother    Prostate cancer Brother 65       unknown Gleason   Colon polyps Brother    Prostate cancer Maternal Uncle    Prostate cancer  Maternal Uncle    Prostate cancer Maternal Uncle    Breast cancer Paternal Aunt        dx. 35 or younger   Colon cancer Paternal Uncle    Prostate cancer Maternal Grandfather        dx. older age   Colon polyps Son 18       colonoscopy for unspecified reason; found some colon polyps, has not had one since   Breast cancer Cousin        4 maternal cousin had breast cancer    Colon cancer Other    Cancer Other        MGM's sister   Rectal cancer Neg Hx    Stomach cancer Neg Hx    Esophageal cancer Neg Hx      Current Outpatient Medications:    albuterol (VENTOLIN HFA) 108 (90 Base) MCG/ACT inhaler, Inhale 2 puffs into the lungs every 6 (six) hours as needed for wheezing or shortness of breath. (Patient not taking: Reported on 04/16/2021), Disp: 18 g, Rfl: 1   benzonatate (TESSALON) 100 MG capsule, Take 1 capsule (100 mg total) by mouth 2 (two) times daily as needed for cough. (Patient not taking: Reported on 04/16/2021), Disp: 30 capsule, Rfl: 1   levothyroxine (SYNTHROID) 100 MCG tablet, Take 100 mcg by mouth See admin instructions.   On Saturdays and Sundays, Disp: , Rfl:    levothyroxine (SYNTHROID) 88 MCG tablet, TAKE 1 TABLET BY MOUTH EVERY DAY (Patient taking differently: Take 88 mcg by mouth See admin instructions. Monday-Friday), Disp: 90 tablet, Rfl: 2   methotrexate (RHEUMATREX) 2.5 MG tablet, Take 20 mg by mouth once a week. 8 tablets weekly, Disp: , Rfl: 1   mometasone (NASONEX) 50 MCG/ACT nasal spray, Place 2 sprays into the nose daily. (Patient not taking: Reported on 04/16/2021), Disp: 17 g, Rfl: 2   sulfaSALAzine (AZULFIDINE) 500 MG tablet, Take 1,000 mg by mouth 2 (two) times daily., Disp: , Rfl:    triamcinolone (KENALOG) 0.1 %, Apply 1 application topically daily as needed (itching). (Patient not taking: Reported on 04/16/2021), Disp: , Rfl:    No Known Allergies    The patient states she uses post menopausal status for birth control. Last LMP was No LMP recorded.  Patient is postmenopausal.. Negative for Dysmenorrhea. Negative for: breast discharge, breast lump(s), breast pain and breast self exam. Associated symptoms include abnormal vaginal bleeding. Pertinent negatives include abnormal bleeding (hematology), anxiety, decreased libido, depression, difficulty falling sleep, dyspareunia, history of infertility, nocturia, sexual dysfunction, sleep disturbances, urinary incontinence, urinary urgency, vaginal discharge and vaginal itching. Diet regular.The patient states her exercise level is  intermittent.   . The patient's tobacco use is:  Social History   Tobacco Use  Smoking Status Former   Packs/day: 0.25   Years: 21.00   Pack years: 5.25   Types: Cigarettes   Quit date: 02/13/1984   Years since quitting: 37.3  Smokeless Tobacco Never  . She has been exposed to passive smoke. The patient's alcohol use is:  Social History   Substance and Sexual Activity  Alcohol Use No   Review of Systems  Constitutional: Negative.   HENT:  Positive for postnasal drip. Negative for hoarse voice.   Eyes: Negative.   Respiratory: Negative.    Cardiovascular: Negative.  Negative for palpitations.  Gastrointestinal: Negative.  Negative for constipation.  Endocrine: Negative.  Negative for heat intolerance.  Genitourinary: Negative.   Musculoskeletal: Negative.   Skin: Negative.   Allergic/Immunologic: Negative.   Neurological: Negative.  Negative for tremors.  Hematological: Negative.   Psychiatric/Behavioral: Negative.      Today's Vitals   05/07/21 0918  BP: 126/80  Pulse: 90  Temp: 99.1 F (37.3 C)  Weight: 158 lb 6.4 oz (71.8 kg)  Height: 5' 1.4" (1.56 m)   Body mass index is 29.54 kg/m.  Wt Readings from Last 3 Encounters:  05/14/21 159 lb 12.8 oz (72.5 kg)  05/07/21 158 lb 6.4 oz (71.8 kg)  04/16/21 157 lb (71.2 kg)    BP Readings from Last 3 Encounters:  05/14/21 118/64  05/07/21 126/80  04/16/21 116/70    Objective:  Physical  Exam Vitals and nursing note reviewed.  Constitutional:      Appearance: Normal appearance.  HENT:     Head: Normocephalic and atraumatic.     Right Ear: Tympanic membrane, ear canal and external ear normal.     Left Ear: Tympanic membrane, ear canal and external ear normal.     Nose:     Comments: Masked     Mouth/Throat:     Comments: Masked  Eyes:     Extraocular Movements: Extraocular movements intact.     Conjunctiva/sclera: Conjunctivae normal.     Pupils: Pupils are equal, round, and reactive to light.  Cardiovascular:     Rate and Rhythm: Normal rate and  regular rhythm.     Pulses: Normal pulses.     Heart sounds: Normal heart sounds.  Pulmonary:     Effort: Pulmonary effort is normal.     Breath sounds: Normal breath sounds.  Chest:  Breasts:    Tanner Score is 5.     Right: Normal.     Left: Normal.  Abdominal:     General: Bowel sounds are normal.     Palpations: Abdomen is soft.  Genitourinary:    Comments: deferred Musculoskeletal:        General: Normal range of motion.     Cervical back: Normal range of motion and neck supple.     Comments: Healed surgical scar anterior shoulder  Skin:    General: Skin is warm and dry.  Neurological:     General: No focal deficit present.     Mental Status: She is alert and oriented to person, place, and time.  Psychiatric:        Mood and Affect: Mood normal.        Behavior: Behavior normal.        Assessment And Plan:     1. Encounter for general adult medical examination w/o abnormal findings Comments: A full exam was performed. Importance of monthly self breast exams was discussed with the patient. PATIENT IS ADVISED TO GET 30-45 MINUTES REGULAR EXERCISE NO LESS THAN FOUR TO FIVE DAYS PER WEEK - BOTH WEIGHTBEARING EXERCISES AND AEROBIC ARE RECOMMENDED.  PATIENT IS ADVISED TO FOLLOW A HEALTHY DIET WITH AT LEAST SIX FRUITS/VEGGIES PER DAY, DECREASE INTAKE OF RED MEAT, AND TO INCREASE FISH INTAKE TO TWO DAYS PER  WEEK.  MEATS/FISH SHOULD NOT BE FRIED, BAKED OR BROILED IS PREFERABLE.  IT IS ALSO IMPORTANT TO CUT BACK ON YOUR SUGAR INTAKE. PLEASE AVOID ANYTHING WITH ADDED SUGAR, CORN SYRUP OR OTHER SWEETENERS. IF YOU MUST USE A SWEETENER, YOU CAN TRY STEVIA. IT IS ALSO IMPORTANT TO AVOID ARTIFICIALLY SWEETENERS AND DIET BEVERAGES. LASTLY, I SUGGEST WEARING SPF 50 SUNSCREEN ON EXPOSED PARTS AND ESPECIALLY WHEN IN THE DIRECT SUNLIGHT FOR AN EXTENDED PERIOD OF TIME.  PLEASE AVOID FAST FOOD RESTAURANTS AND INCREASE YOUR WATER INTAKE.  2. Primary hypothyroidism Comments: Recent TSH above 4.0, meds adjusted to 79mg M-Th and 1025m F-Sun. She agrees to have her levels rechecked in 6-8 weeks.  - CMP14+EGFR - Lipid panel - CBC no Diff - TSH; Future  3. Postnasal drip Comments: She is encouraged to remove dairy from her diet, c/w OTC antihistamine use prn and drink one hot beverage daily.   4. Asymptomatic microscopic hematuria Comments: I will recheck in 2-3 months. Will consider renal u/s in the future.  - Urinalysis, Complete (81001); Future - POCT Urinalysis Dipstick (81002)  5. Overweight with body mass index (BMI) 25.0-29.9 Comments: She is encouraged to aim for at least 150 minutes of exercise per week.   6. Influenza vaccination declined  BMI is acceptable for her demographic. Advised to aim for at least 150 minutes of exercise per week.   Patient was given opportunity to ask questions. Patient verbalized understanding of the plan and was able to repeat key elements of the plan. All questions were answered to their satisfaction.   I, RoMaximino GreenlandMD, have reviewed all documentation for this visit. The documentation on 05/07/21 for the exam, diagnosis, procedures, and orders are all accurate and complete.   THE PATIENT IS ENCOURAGED TO PRACTICE SOCIAL DISTANCING DUE TO THE COVID-19 PANDEMIC.

## 2021-05-07 NOTE — Patient Instructions (Addendum)
Change levothyroxine 83mcg M-Th 160mcg Fri, Sat, Sun  Health Maintenance, Female Adopting a healthy lifestyle and getting preventive care are important in promoting health and wellness. Ask your health care provider about: The right schedule for you to have regular tests and exams. Things you can do on your own to prevent diseases and keep yourself healthy. What should I know about diet, weight, and exercise? Eat a healthy diet  Eat a diet that includes plenty of vegetables, fruits, low-fat dairy products, and lean protein. Do not eat a lot of foods that are high in solid fats, added sugars, or sodium. Maintain a healthy weight Body mass index (BMI) is used to identify weight problems. It estimates body fat based on height and weight. Your health care provider can help determine your BMI and help you achieve or maintain a healthy weight. Get regular exercise Get regular exercise. This is one of the most important things you can do for your health. Most adults should: Exercise for at least 150 minutes each week. The exercise should increase your heart rate and make you sweat (moderate-intensity exercise). Do strengthening exercises at least twice a week. This is in addition to the moderate-intensity exercise. Spend less time sitting. Even light physical activity can be beneficial. Watch cholesterol and blood lipids Have your blood tested for lipids and cholesterol at 73 years of age, then have this test every 5 years. Have your cholesterol levels checked more often if: Your lipid or cholesterol levels are high. You are older than 73 years of age. You are at high risk for heart disease. What should I know about cancer screening? Depending on your health history and family history, you may need to have cancer screening at various ages. This may include screening for: Breast cancer. Cervical cancer. Colorectal cancer. Skin cancer. Lung cancer. What should I know about heart disease,  diabetes, and high blood pressure? Blood pressure and heart disease High blood pressure causes heart disease and increases the risk of stroke. This is more likely to develop in people who have high blood pressure readings or are overweight. Have your blood pressure checked: Every 3-5 years if you are 64-52 years of age. Every year if you are 23 years old or older. Diabetes Have regular diabetes screenings. This checks your fasting blood sugar level. Have the screening done: Once every three years after age 73 if you are at a normal weight and have a low risk for diabetes. More often and at a younger age if you are overweight or have a high risk for diabetes. What should I know about preventing infection? Hepatitis B If you have a higher risk for hepatitis B, you should be screened for this virus. Talk with your health care provider to find out if you are at risk for hepatitis B infection. Hepatitis C Testing is recommended for: Everyone born from [birth year] through 1963. Anyone with known risk factors for hepatitis C. Sexually transmitted infections (STIs) Get screened for STIs, including gonorrhea and chlamydia, if: You are sexually active and are younger than 73 years of age. You are older than 73 years of age and your health care provider tells you that you are at risk for this type of infection. Your sexual activity has changed since you were last screened, and you are at increased risk for chlamydia or gonorrhea. Ask your health care provider if you are at risk. Ask your health care provider about whether you are at high risk for HIV. Your health care provider  may recommend a prescription medicine to help prevent HIV infection. If you choose to take medicine to prevent HIV, you should first get tested for HIV. You should then be tested every 3 months for as long as you are taking the medicine. Pregnancy If you are about to stop having your period (premenopausal) and you may become pregnant,  seek counseling before you get pregnant. Take 400 to 800 micrograms (mcg) of folic acid every day if you become pregnant. Ask for birth control (contraception) if you want to prevent pregnancy. Osteoporosis and menopause Osteoporosis is a disease in which the bones lose minerals and strength with aging. This can result in bone fractures. If you are 73 years old or older, or if you are at risk for osteoporosis and fractures, ask your health care provider if you should: Be screened for bone loss. Take a calcium or vitamin D supplement to lower your risk of fractures. Be given hormone replacement therapy (HRT) to treat symptoms of menopause. Follow these instructions at home: Alcohol use Do not drink alcohol if: Your health care provider tells you not to drink. You are pregnant, may be pregnant, or are planning to become pregnant. If you drink alcohol: Limit how much you have to: 0-1 drink a day. Know how much alcohol is in your drink. In the [place] one drink equals one 12 oz bottle of beer (355 mL), one 5 oz glass of wine (148 mL), or one 1 oz glass of hard liquor (44 mL). Lifestyle Do not use any products that contain nicotine or tobacco. These products include cigarettes, chewing tobacco, and vaping devices, such as e-cigarettes. If you need help quitting, ask your health care provider. Do not use street drugs. Do not share needles. Ask your health care provider for help if you need support or information about quitting drugs. General instructions Schedule regular health, dental, and eye exams. Stay current with your vaccines. Tell your health care provider if: You often feel depressed. You have ever been abused or do not feel safe at home. Summary Adopting a healthy lifestyle and getting preventive care are important in promoting health and wellness. Follow your health care provider's instructions about healthy diet, exercising, and getting tested or screened for diseases. Follow your  health care provider's instructions on monitoring your cholesterol and blood pressure. This information is not intended to replace advice given to you by your health care provider. Make sure you discuss any questions you have with your health care provider. Document Revised: 10/06/2020 Document Reviewed: 10/06/2020 [employer] Patient Education  [employer].

## 2021-05-11 DIAGNOSIS — M25611 Stiffness of right shoulder, not elsewhere classified: Secondary | ICD-10-CM | POA: Diagnosis not present

## 2021-05-11 DIAGNOSIS — Z96611 Presence of right artificial shoulder joint: Secondary | ICD-10-CM | POA: Diagnosis not present

## 2021-05-11 DIAGNOSIS — R531 Weakness: Secondary | ICD-10-CM | POA: Diagnosis not present

## 2021-05-12 DIAGNOSIS — Z1152 Encounter for screening for COVID-19: Secondary | ICD-10-CM | POA: Diagnosis not present

## 2021-05-13 DIAGNOSIS — E039 Hypothyroidism, unspecified: Secondary | ICD-10-CM | POA: Insufficient documentation

## 2021-05-13 DIAGNOSIS — E559 Vitamin D deficiency, unspecified: Secondary | ICD-10-CM | POA: Insufficient documentation

## 2021-05-14 ENCOUNTER — Other Ambulatory Visit: Payer: Self-pay

## 2021-05-14 ENCOUNTER — Ambulatory Visit (INDEPENDENT_AMBULATORY_CARE_PROVIDER_SITE_OTHER): Payer: Medicare Other

## 2021-05-14 VITALS — BP 118/64 | HR 79 | Temp 98.4°F | Ht 61.8 in | Wt 159.8 lb

## 2021-05-14 DIAGNOSIS — M25611 Stiffness of right shoulder, not elsewhere classified: Secondary | ICD-10-CM | POA: Diagnosis not present

## 2021-05-14 DIAGNOSIS — Z96611 Presence of right artificial shoulder joint: Secondary | ICD-10-CM | POA: Diagnosis not present

## 2021-05-14 DIAGNOSIS — Z0000 Encounter for general adult medical examination without abnormal findings: Secondary | ICD-10-CM

## 2021-05-14 DIAGNOSIS — R531 Weakness: Secondary | ICD-10-CM | POA: Diagnosis not present

## 2021-05-14 NOTE — Patient Instructions (Signed)
[name] , Thank you for taking time to come for your Medicare Wellness Visit. I appreciate your ongoing commitment to your health goals. Please review the following plan we discussed and let me know if I can assist you in the future.   Screening recommendations/referrals: Colonoscopy: completed 04/16/2021 Mammogram: completed 04/08/2021 Bone Density: completed 04/08/2021 Recommended yearly ophthalmology/optometry visit for glaucoma screening and checkup Recommended yearly dental visit for hygiene and checkup  Vaccinations: Influenza vaccine: decline Pneumococcal vaccine: decline Tdap vaccine: decline Shingles vaccine: decline   Covid-19: 01/30/2020, 07/27/2019, 07/06/2019  Advanced directives: Advance directive discussed with you today. Even though you declined this today please call our office should you change your mind and we can give you the proper paperwork for you to fill out.  Conditions/risks identified: none  Next appointment: Follow up in one year for your annual wellness visit    Preventive Care 65 Years and Older, Female Preventive care refers to lifestyle choices and visits with your health care provider that can promote health and wellness. What does preventive care include? A yearly physical exam. This is also called an annual well check. Dental exams once or twice a year. Routine eye exams. Ask your health care provider how often you should have your eyes checked. Personal lifestyle choices, including: Daily care of your teeth and gums. Regular physical activity. Eating a healthy diet. Avoiding tobacco and drug use. Limiting alcohol use. Practicing safe sex. Taking low-dose aspirin every day. Taking vitamin and mineral supplements as recommended by your health care provider. What happens during an annual well check? The services and screenings done by your health care provider during your annual well check will depend on your age, overall health, lifestyle risk  factors, and family history of disease. Counseling  Your health care provider may ask you questions about your: Alcohol use. Tobacco use. Drug use. Emotional well-being. Home and relationship well-being. Sexual activity. Eating habits. History of falls. Memory and ability to understand (cognition). Work and work Statistician. Reproductive health. Screening  You may have the following tests or measurements: Height, weight, and BMI. Blood pressure. Lipid and cholesterol levels. These may be checked every 5 years, or more frequently if you are over 96 years old. Skin check. Lung cancer screening. You may have this screening every year starting at age 20 if you have a 30-pack-year history of smoking and currently smoke or have quit within the past 15 years. Fecal occult blood test (FOBT) of the stool. You may have this test every year starting at age 73. Flexible sigmoidoscopy or colonoscopy. You may have a sigmoidoscopy every 5 years or a colonoscopy every 10 years starting at age 53. Hepatitis C blood test. Hepatitis B blood test. Sexually transmitted disease (STD) testing. Diabetes screening. This is done by checking your blood sugar (glucose) after you have not eaten for a while (fasting). You may have this done every 1-3 years. Bone density scan. This is done to screen for osteoporosis. You may have this done starting at age 33. Mammogram. This may be done every 1-2 years. Talk to your health care provider about how often you should have regular mammograms. Talk with your health care provider about your test results, treatment options, and if necessary, the need for more tests. Vaccines  Your health care provider may recommend certain vaccines, such as: Influenza vaccine. This is recommended every year. Tetanus, diphtheria, and acellular pertussis (Tdap, Td) vaccine. You may need a Td booster every 10 years. Zoster vaccine. You may need  this after age 28. Pneumococcal 13-valent  conjugate (PCV13) vaccine. One dose is recommended after age 74. Pneumococcal polysaccharide (PPSV23) vaccine. One dose is recommended after age 60. Talk to your health care provider about which screenings and vaccines you need and how often you need them. This information is not intended to replace advice given to you by your health care provider. Make sure you discuss any questions you have with your health care provider. Document Released: 06/13/2015 Document Revised: 02/04/2016 Document Reviewed: 03/18/2015 Elsevier Interactive Patient Education  2017 [place] Prevention in the Home Falls can cause injuries. They can happen to people of all ages. There are many things you can do to make your home safe and to help prevent falls. What can I do on the outside of my home? Regularly fix the edges of walkways and driveways and fix any cracks. Remove anything that might make you trip as you walk through a door, such as a raised step or threshold. Trim any bushes or trees on the path to your home. Use bright outdoor lighting. Clear any walking paths of anything that might make someone trip, such as rocks or tools. Regularly check to see if handrails are loose or broken. Make sure that both sides of any steps have handrails. Any raised decks and porches should have guardrails on the edges. Have any leaves, snow, or ice cleared regularly. Use sand or salt on walking paths during winter. Clean up any spills in your garage right away. This includes oil or grease spills. What can I do in the bathroom? Use night lights. Install grab bars by the toilet and in the tub and shower. Do not use towel bars as grab bars. Use non-skid mats or decals in the tub or shower. If you need to sit down in the shower, use a plastic, non-slip stool. Keep the floor dry. Clean up any water that spills on the floor as soon as it happens. Remove soap buildup in the tub or shower regularly. Attach bath mats  securely with double-sided non-slip rug tape. Do not have throw rugs and other things on the floor that can make you trip. What can I do in the bedroom? Use night lights. Make sure that you have a light by your bed that is easy to reach. Do not use any sheets or blankets that are too big for your bed. They should not hang down onto the floor. Have a firm chair that has side arms. You can use this for support while you get dressed. Do not have throw rugs and other things on the floor that can make you trip. What can I do in the kitchen? Clean up any spills right away. Avoid walking on wet floors. Keep items that you use a lot in easy-to-reach places. If you need to reach something above you, use a strong step stool that has a grab bar. Keep electrical cords out of the way. Do not use floor polish or wax that makes floors slippery. If you must use wax, use non-skid floor wax. Do not have throw rugs and other things on the floor that can make you trip. What can I do with my stairs? Do not leave any items on the stairs. Make sure that there are handrails on both sides of the stairs and use them. Fix handrails that are broken or loose. Make sure that handrails are as long as the stairways. Check any carpeting to make sure that it is firmly attached to  the stairs. Fix any carpet that is loose or worn. Avoid having throw rugs at the top or bottom of the stairs. If you do have throw rugs, attach them to the floor with carpet tape. Make sure that you have a light switch at the top of the stairs and the bottom of the stairs. If you do not have them, ask someone to add them for you. What else can I do to help prevent falls? Wear shoes that: Do not have high heels. Have rubber bottoms. Are comfortable and fit you well. Are closed at the toe. Do not wear sandals. If you use a stepladder: Make sure that it is fully opened. Do not climb a closed stepladder. Make sure that both sides of the stepladder  are locked into place. Ask someone to hold it for you, if possible. Clearly mark and make sure that you can see: Any grab bars or handrails. First and last steps. Where the edge of each step is. Use tools that help you move around (mobility aids) if they are needed. These include: Canes. Walkers. Scooters. Crutches. Turn on the lights when you go into a dark area. Replace any light bulbs as soon as they burn out. Set up your furniture so you have a clear path. Avoid moving your furniture around. If any of your floors are uneven, fix them. If there are any pets around you, be aware of where they are. Review your medicines with your doctor. Some medicines can make you feel dizzy. This can increase your chance of falling. Ask your doctor what other things that you can do to help prevent falls. This information is not intended to replace advice given to you by your health care provider. Make sure you discuss any questions you have with your health care provider. Document Released: 03/13/2009 Document Revised: 10/23/2015 Document Reviewed: 06/21/2014 Elsevier Interactive Patient Education  2017 [employer].

## 2021-05-14 NOTE — Progress Notes (Signed)
This visit occurred during the SARS-CoV-2 public health emergency.  Safety protocols were in place, including screening questions prior to the visit, additional usage of staff PPE, and extensive cleaning of exam room while observing appropriate contact time as indicated for disinfecting solutions.  Subjective:   Evelyn Orr is a 75 y.o. female who presents for Medicare Annual (Subsequent) preventive examination.  Review of Systems     Cardiac Risk Factors include: advanced age (>28mn, >>25women)     Objective:    Today's Vitals   05/14/21 0859  BP: 118/64  Pulse: 79  Temp: 98.4 F (36.9 C)  TempSrc: Oral  SpO2: 95%  Weight: 159 lb 12.8 oz (72.5 kg)  Height: 5' 1.8" (1.57 m)   Body mass index is 29.42 kg/m.  Advanced Directives 05/14/2021 03/05/2021 03/03/2021 02/03/2021 04/23/2020 12/10/2019 04/10/2019  Does Patient Have a Medical Advance Directive? _0  No No  Would patient like information on creating a medical advance directive? No - Patient declined No - Patient declined - - No - Patient declined - -    Current Medications (verified) Outpatient Encounter Medications as of 05/14/2021  Medication Sig   levothyroxine (SYNTHROID) 100 MCG tablet Take 100 mcg by mouth See admin instructions. On Saturdays and Sundays   levothyroxine (SYNTHROID) 88 MCG tablet TAKE 1 TABLET BY MOUTH EVERY DAY (Patient taking differently: Take 88 mcg by mouth See admin instructions. Monday-Friday)   methotrexate (RHEUMATREX) 2.5 MG tablet Take 20 mg by mouth once a week. 8 tablets weekly   sulfaSALAzine (AZULFIDINE) 500 MG tablet Take 1,000 mg by mouth 2 (two) times daily.   albuterol (VENTOLIN HFA) 108 (90 Base) MCG/ACT inhaler Inhale 2 puffs into the lungs every 6 (six) hours as needed for wheezing or shortness of breath. (Patient not taking: Reported on 04/16/2021)   benzonatate (TESSALON) 100 MG capsule Take 1 capsule (100 mg total) by mouth 2 (two) times daily as needed for  cough. (Patient not taking: Reported on 04/16/2021)   mometasone (NASONEX) 50 MCG/ACT nasal spray Place 2 sprays into the nose daily. (Patient not taking: Reported on 04/16/2021)   triamcinolone (KENALOG) 0.1 % Apply 1 application topically daily as needed (itching). (Patient not taking: Reported on 04/16/2021)   No facility-administered encounter medications on file as of 05/14/2021.    Allergies (verified) Patient has no known allergies.   History: Past Medical History:  Diagnosis Date   Arthritis    generalized   [place]    [place] of upper-outer quadrant [place] right [place] 03[place]   Cough    GERD (gastroesophageal reflux disease)    uses PRN meds/with certain foods   Hypothyroidism    Nasal congestion    PONV (postoperative nausea and vomiting)    Rheumatoid arthritis(714.0)    Seasonal allergies    Seasonal allergies    Thyroid disease    hypothyroidism   Past Surgical History:  Procedure Laterality Date   BREAST LUMPECTOMY WITH RADIOACTIVE SEED LOCALIZATION Right 04/09/2015   Procedure: BREAST LUMPECTOMY WITH RADIOACTIVE SEED LOCALIZATION;  Surgeon: MRolm Bookbinder MD;  [facility]  Service: General;  Laterality: Right;   BREAST REDUCTION SURGERY  2001   CALCANEAL OSTEOTOMY Left 10/11/2013   Procedure: LEFT CALCANEAL OSTEOTOMY;  Surgeon: JWylene Simmer MD;  [facility]  Service: Orthopedics;  Laterality: Left;   CARPAL TUNNEL RELEASE     left   CERVICAL FUSION  2009   COLONOSCOPY  2002,2005,2008 w/Brodie   COLONOSCOPY  2015   Brodie-MAC-prep good-TA- recall 5 yrs   GASTROCNEMIUS RECESSION Left 10/11/2013   Procedure: LEFT GASTROC RECESSION; LEFT POSTERIOR TIBIAL TENOLYSIS;  Surgeon: Wylene Simmer, MD;  Location: [facility];  Service: Orthopedics;  Laterality: Left;   KNEE ARTHROSCOPY  2004   right   POLYPECTOMY     REMOVAL OF IMPLANT Left 02/14/2014   Procedure:  LEFT FOOT REMOVAL OF DEEP IMPLANT;  Surgeon: Wylene Simmer, MD;  Location: [place];  Service: Orthopedics;  Laterality: Left;   REVERSE SHOULDER ARTHROPLASTY Right 03/05/2021   Procedure: REVERSE SHOULDER ARTHROPLASTY;  Surgeon: Tania Ade, MD;  Location: [facility];  Service: Orthopedics;  Laterality: Right;   SHOULDER ARTHROSCOPY W/ ROTATOR CUFF REPAIR  2006   right   THYROIDECTOMY     age 45   TONSILLECTOMY     TOTAL KNEE ARTHROPLASTY Right 06/21/2016   Procedure: RIGHT TOTAL KNEE ARTHROPLASTY;  Surgeon: Paralee Cancel, MD;  Location: [facility];  Service: Orthopedics;  Laterality: Right;   TUBAL LIGATION     WRIST ARTHRODESIS  2003   left    Family History  Problem Relation Age of Onset   Colon polyps Mother    Hypertension Mother    Heart disease Father    Heart attack Father    Other Sister        high breast density   Colon polyps Brother    Prostate cancer Brother 58       unknown Gleason   Colon polyps Brother    Prostate cancer Maternal Uncle    Prostate cancer Maternal Uncle    Prostate cancer Maternal Uncle    Breast cancer Paternal Aunt        dx. 29 or younger   Colon cancer Paternal Uncle    Prostate cancer Maternal Grandfather        dx. older age   Colon polyps Son 23       colonoscopy for unspecified reason; found some colon polyps, has not had one since   Breast cancer Cousin        4 maternal cousin had breast cancer    Colon cancer Other    Cancer Other        MGM's sister   Rectal cancer Neg Hx    Stomach cancer Neg Hx    Esophageal cancer Neg Hx    Social History   Socioeconomic History   Marital status: Married    Spouse name: Not on file   Number of children: Not on file   Years of education: Not on file   Highest education level: Not on file  Occupational History   Occupation: retired  Tobacco Use   Smoking status: Former    Packs/day: 0.25    Years: 21.00    Pack years: 5.25    Types: Cigarettes    Quit date:  02/13/1984    Years since quitting: 37.2   Smokeless tobacco: Never  Vaping Use   Vaping Use: Never used  Substance and Sexual Activity   Alcohol use: No   Drug use: No   Sexual activity: Yes    Birth control/protection: Post-menopausal  Other Topics Concern   Not on file  Social History Narrative   Not on file   Social Determinants of Health   Financial Resource Strain: Low Risk    Difficulty of Paying Living Expenses: Not hard at all  Food Insecurity: No Food Insecurity   Worried  About Running Out of Food in the Last Year: Never true   Ran Out of Food in the Last Year: Never true  Transportation Needs: No Transportation Needs   Lack of Transportation (Medical): No   Lack of Transportation (Non-Medical): No  Physical Activity: Inactive   Days of Exercise per Week: 0 days   Minutes of Exercise per Session: 0 min  Stress: No Stress Concern Present   Feeling of Stress : Not at all  Social Connections: Not on file    Tobacco Counseling Counseling given: Not Answered   Clinical Intake:  Pre-visit preparation completed: Yes  Pain : No/denies pain     Nutritional Status: BMI 25 -29 Overweight Nutritional Risks: None Diabetes: No  How often do you need to have someone help you when you read instructions, pamphlets, or other written materials from your doctor or pharmacy?: 1 - Never What is the last grade level you completed in school?: 12th grade  Diabetic? no  Interpreter Needed?: No  Information entered by :: NAllen LPN   Activities of Daily Living In your present state of health, do you have any difficulty performing the following activities: 05/14/2021 03/05/2021  Hearing? N N  Vision? N N  Difficulty concentrating or making decisions? N N  Walking or climbing stairs? Y N  Dressing or bathing? N N  Doing errands, shopping? N N  Preparing Food and eating ? N -  Using the Toilet? N -  In the past six months, have you accidently leaked urine? N -  Do you  have problems with loss of bowel control? N -  Managing your Medications? N -  Managing your Finances? N -  Housekeeping or managing your Housekeeping? N -  Some recent data might be hidden    Patient Care Team: [name], MD as PCP - General (Internal Medicine) Earnstine Regal, PA-C as Physician Assistant (Obstetrics and Gynecology) Rolm Bookbinder, MD as Consulting Physician (General Surgery) Truitt Merle, MD as Consulting Physician (Hematology) Thea Silversmith, MD as Consulting Physician (Radiation Oncology) Mauro Kaufmann, RN as Registered Nurse Rockwell Germany, RN as Registered Nurse Jake Shark Johny Blamer, NP as Nurse Practitioner (Hematology and Oncology)  Indicate any recent Medical Services you may have received from other than [facility] providers in the past year (date may be approximate).     Assessment:   This is a routine wellness examination for Marshal.  Hearing/Vision screen Vision Screening - Comments:: Regular eye exams, Dr. Payton [facility] office  Dietary issues and exercise activities discussed: Current Exercise Habits: The patient does not participate in regular exercise at present   Goals Addressed             This Visit's Progress    Patient Stated       05/14/2021, stay healthy       Depression Screen PHQ 2/9 Scores 05/14/2021 05/07/2021 04/23/2020 09/04/2019 04/10/2019 03/19/2019 07/03/2018  PHQ - 2 Score 0 0 0 0 0 0 0  PHQ- 9 Score - - - - 3 - -    Fall Risk Fall Risk  05/14/2021 05/07/2021 04/23/2020 04/10/2019 03/19/2019  Falls in the past year? 0 0 0 0 0  Comment - - - - -  Number falls in past yr: - 0 - 0 -  Comment - - - - -  Injury with Fall? - 0 - - -  Risk for fall due to : No Fall Risks - Medication side effect Medication side effect -  Follow up Falls evaluation  completed;Education provided;Falls prevention discussed - Falls evaluation completed;Education provided;Falls prevention discussed Falls evaluation completed;Falls prevention  discussed -    FALL RISK PREVENTION PERTAINING TO THE HOME:  Any stairs in or around the home? Yes  If so, are there any without handrails? No  Home free of loose throw rugs in walkways, pet beds, electrical cords, etc? Yes  Adequate lighting in your home to reduce risk of falls? Yes   ASSISTIVE DEVICES UTILIZED TO PREVENT FALLS:  Life alert? No  Use of a cane, walker or w/c? No  Grab bars in the bathroom? No  Shower chair or bench in shower? Yes  Elevated toilet seat or a handicapped toilet? Yes   TIMED UP AND GO:  Was the test performed? No .    Gait steady and fast without use of assistive device  Cognitive Function:     6CIT Screen 05/14/2021 04/23/2020 04/10/2019  What Year? 0 points 0 points 0 points  What month? 0 points 0 points 0 points  What time? 0 points 0 points 0 points  Count back from 20 0 points 0 points 0 points  Months in reverse 0 points 0 points 0 points  Repeat phrase 4 points 2 points 2 points  Total Score _0 Immunizations Immunization History  Administered Date(s) Administered   PFIZER(Purple Top)SARS-COV-2 Vaccination 07/06/2019, 07/27/2019, 01/30/2020    TDAP status: Due, Education has been provided regarding the importance of this vaccine. Advised may receive this vaccine at local pharmacy or Health Dept. Aware to provide a copy of the vaccination record if obtained from local pharmacy or Health Dept. Verbalized acceptance and understanding.  Flu Vaccine status: Declined, Education has been provided regarding the importance of this vaccine but patient still declined. Advised may receive this vaccine at local pharmacy or Health Dept. Aware to provide a copy of the vaccination record if obtained from local pharmacy or Health Dept. Verbalized acceptance and understanding.  Pneumococcal vaccine status: Declined,  Education has been provided regarding the importance of this vaccine but patient still declined. Advised may receive this vaccine  at local pharmacy or Health Dept. Aware to provide a copy of the vaccination record if obtained from local pharmacy or Health Dept. Verbalized acceptance and understanding.   Covid-19 vaccine status: Completed vaccines  Qualifies for Shingles Vaccine? Yes   Zostavax completed No   Shingrix Completed?: No.    Education has been provided regarding the importance of this vaccine. Patient has been advised to call insurance company to determine out of pocket expense if they have not yet received this vaccine. Advised may also receive vaccine at local pharmacy or Health Dept. Verbalized acceptance and understanding.  Screening Tests Health Maintenance  Topic Date Due   COVID-19 Vaccine (4 - Booster for Pfizer series) 03/26/2020   Zoster Vaccines- Shingrix (1 of 2) 08/05/2021 (Originally 03/01/1965)   INFLUENZA VACCINE  08/28/2021 (Originally 12/29/2020)   Pneumonia Vaccine 54+ Years old (1 - PCV) 05/07/2022 (Originally 02/28/1952)   TETANUS/TDAP  05/07/2022 (Originally 03/01/1965)   MAMMOGRAM  04/08/2022   COLONOSCOPY (Pts 45-33yr Insurance coverage will need to be confirmed)  04/16/2024   DEXA SCAN  Completed   Hepatitis C Screening  Completed   HPV VACCINES  Aged Out    Health Maintenance  Health Maintenance Due  Topic Date Due   COVID-19 Vaccine (4 - Booster for [place]eries) 03/26/2020    Colorectal cancer screening: Type of screening: Colonoscopy. Completed 04/16/2021. Repeat every 3 years  Mammogram status: Completed 04/08/2021. Repeat every year  Bone Density status: Completed 04/08/2021.   Lung Cancer Screening: (Low Dose CT Chest recommended if Age 62-80 years, 30 pack-year currently smoking OR have quit w/in 15years.) does not qualify.   Lung Cancer Screening Referral: no  Additional Screening:  Hepatitis C Screening: does qualify; Completed 03/19/2019  Vision Screening: Recommended annual ophthalmology exams for early detection of glaucoma and other disorders of the  eye. Is the patient up to date with their annual eye exam?  Yes  Who is the provider or what is the name of the office in which the patient attends annual eye exams? [facility] If pt is not established with a provider, would they like to be referred to a provider to establish care? No .   Dental Screening: Recommended annual dental exams for proper oral hygiene  Community Resource Referral / Chronic Care Management: CRR required this visit?  No   CCM required this visit?  No      Plan:     I have personally reviewed and noted the following in the patients chart:   Medical and social history Use of alcohol, tobacco or illicit drugs  Current medications and supplements including opioid prescriptions.  Functional ability and status Nutritional status Physical activity Advanced directives List of other physicians Hospitalizations, surgeries, and ER visits in previous 12 months Vitals Screenings to include cognitive, depression, and falls Referrals and appointments  In addition, I have reviewed and discussed with patient certain preventive protocols, quality metrics, and best practice recommendations. A written personalized care plan for preventive services as well as general preventive health recommendations were provided to patient.     Kellie Simmering, LPN   [id number]   Nurse Notes: none

## 2021-05-19 DIAGNOSIS — Z96611 Presence of right artificial shoulder joint: Secondary | ICD-10-CM | POA: Diagnosis not present

## 2021-05-19 DIAGNOSIS — M25611 Stiffness of right shoulder, not elsewhere classified: Secondary | ICD-10-CM | POA: Diagnosis not present

## 2021-05-19 DIAGNOSIS — Z1152 Encounter for screening for COVID-19: Secondary | ICD-10-CM | POA: Diagnosis not present

## 2021-05-19 DIAGNOSIS — R531 Weakness: Secondary | ICD-10-CM | POA: Diagnosis not present

## 2021-06-24 DIAGNOSIS — M8580 Other specified disorders of bone density and structure, unspecified site: Secondary | ICD-10-CM | POA: Diagnosis not present

## 2021-06-24 DIAGNOSIS — M79643 Pain in unspecified hand: Secondary | ICD-10-CM | POA: Diagnosis not present

## 2021-06-24 DIAGNOSIS — Z79899 Other long term (current) drug therapy: Secondary | ICD-10-CM | POA: Diagnosis not present

## 2021-06-24 DIAGNOSIS — M0589 Other rheumatoid arthritis with rheumatoid factor of multiple sites: Secondary | ICD-10-CM | POA: Diagnosis not present

## 2021-06-24 DIAGNOSIS — M1990 Unspecified osteoarthritis, unspecified site: Secondary | ICD-10-CM | POA: Diagnosis not present

## 2021-06-25 ENCOUNTER — Other Ambulatory Visit: Payer: Self-pay

## 2021-06-25 ENCOUNTER — Other Ambulatory Visit: Payer: Medicare PPO

## 2021-06-25 DIAGNOSIS — R3121 Asymptomatic microscopic hematuria: Secondary | ICD-10-CM

## 2021-06-25 DIAGNOSIS — M0589 Other rheumatoid arthritis with rheumatoid factor of multiple sites: Secondary | ICD-10-CM

## 2021-06-25 DIAGNOSIS — E039 Hypothyroidism, unspecified: Secondary | ICD-10-CM

## 2021-06-26 LAB — URINALYSIS, COMPLETE
Bilirubin, UA: NEGATIVE
Glucose, UA: NEGATIVE
Ketones, UA: NEGATIVE
Leukocytes,UA: NEGATIVE
Nitrite, UA: NEGATIVE
Protein,UA: NEGATIVE
RBC, UA: NEGATIVE
Specific Gravity, UA: 1.01 (ref 1.005–1.030)
Urobilinogen, Ur: 0.2 mg/dL (ref 0.2–1.0)
pH, UA: 7.5 (ref 5.0–7.5)

## 2021-06-26 LAB — TSH: TSH: 2.67 u[IU]/mL (ref 0.450–4.500)

## 2021-06-26 LAB — MICROSCOPIC EXAMINATION
Bacteria, UA: NONE SEEN
Casts: NONE SEEN /lpf
WBC, UA: NONE SEEN /hpf (ref 0–5)

## 2021-06-26 LAB — CBC WITH DIFFERENTIAL/PLATELET
Basophils Absolute: 0 10*3/uL (ref 0.0–0.2)
Basos: 1 %
EOS (ABSOLUTE): 0.2 10*3/uL (ref 0.0–0.4)
Eos: 4 %
Hematocrit: 37.5 % (ref 34.0–46.6)
Hemoglobin: 12.5 g/dL (ref 11.1–15.9)
Immature Grans (Abs): 0 10*3/uL (ref 0.0–0.1)
Immature Granulocytes: 0 %
Lymphocytes Absolute: 2.7 10*3/uL (ref 0.7–3.1)
Lymphs: 54 %
MCH: 30.6 pg (ref 26.6–33.0)
MCHC: 33.3 g/dL (ref 31.5–35.7)
MCV: 92 fL (ref 79–97)
Monocytes Absolute: 0.4 10*3/uL (ref 0.1–0.9)
Monocytes: 7 %
Neutrophils Absolute: 1.7 10*3/uL (ref 1.4–7.0)
Neutrophils: 34 %
Platelets: 270 10*3/uL (ref 150–450)
RBC: 4.08 x10E6/uL (ref 3.77–5.28)
RDW: 13.4 % (ref 11.7–15.4)
WBC: 5 10*3/uL (ref 3.4–10.8)

## 2021-06-26 LAB — C-REACTIVE PROTEIN: CRP: 5 mg/L (ref 0–10)

## 2021-06-26 LAB — SEDIMENTATION RATE: Sed Rate: 82 mm/hr — ABNORMAL HIGH (ref 0–40)

## 2021-06-29 ENCOUNTER — Telehealth: Payer: Self-pay

## 2021-06-29 NOTE — Telephone Encounter (Signed)
Mr. Evelyn Orr was notified that I was returning Evelyn Orr's call to let her know that we have the information that her labs were to go to Dr. Lahoma Rocker and they have been faxed.

## 2021-07-01 LAB — CMP14+EGFR
ALT: 15 IU/L (ref 0–32)
AST: 28 IU/L (ref 0–40)
Albumin/Globulin Ratio: 1.2 (ref 1.2–2.2)
Albumin: 4.2 g/dL (ref 3.7–4.7)
Alkaline Phosphatase: 114 IU/L (ref 44–121)
BUN/Creatinine Ratio: 11 — ABNORMAL LOW (ref 12–28)
BUN: 9 mg/dL (ref 8–27)
Bilirubin Total: 0.3 mg/dL (ref 0.0–1.2)
CO2: 25 mmol/L (ref 20–29)
Calcium: 9.1 mg/dL (ref 8.7–10.3)
Chloride: 102 mmol/L (ref 96–106)
Creatinine, Ser: 0.84 mg/dL (ref 0.57–1.00)
Globulin, Total: 3.5 g/dL (ref 1.5–4.5)
Glucose: 92 mg/dL (ref 70–99)
Potassium: 3.9 mmol/L (ref 3.5–5.2)
Sodium: 140 mmol/L (ref 134–144)
Total Protein: 7.7 g/dL (ref 6.0–8.5)
eGFR: 73 mL/min/{1.73_m2} (ref >59)

## 2021-07-14 ENCOUNTER — Other Ambulatory Visit: Payer: Self-pay

## 2021-07-14 ENCOUNTER — Ambulatory Visit (INDEPENDENT_AMBULATORY_CARE_PROVIDER_SITE_OTHER): Payer: Medicare PPO | Admitting: Internal Medicine

## 2021-07-14 ENCOUNTER — Encounter: Payer: Self-pay | Admitting: Internal Medicine

## 2021-07-14 VITALS — BP 124/82 | HR 70 | Temp 98.5°F | Ht 61.8 in | Wt 153.4 lb

## 2021-07-14 DIAGNOSIS — R8290 Unspecified abnormal findings in urine: Secondary | ICD-10-CM

## 2021-07-14 DIAGNOSIS — R109 Unspecified abdominal pain: Secondary | ICD-10-CM | POA: Diagnosis not present

## 2021-07-14 DIAGNOSIS — Z853 Personal history of malignant neoplasm of breast: Secondary | ICD-10-CM | POA: Diagnosis not present

## 2021-07-14 LAB — POCT URINALYSIS DIPSTICK
Bilirubin, UA: NEGATIVE
Blood, UA: NEGATIVE
Glucose, UA: NEGATIVE
Ketones, UA: NEGATIVE
Leukocytes, UA: NEGATIVE
Nitrite, UA: NEGATIVE
Protein, UA: POSITIVE — AB
Spec Grav, UA: 1.02 (ref 1.010–1.025)
Urobilinogen, UA: 0.2 E.U./dL
pH, UA: 6.5 (ref 5.0–8.0)

## 2021-07-14 NOTE — Progress Notes (Signed)
I,Katawbba Wiggins,acting as a Education administrator for Maximino Greenland, MD.,have documented all relevant documentation on the behalf of Maximino Greenland, MD,as directed by  Maximino Greenland, MD while in the presence of Maximino Greenland, MD.  This visit occurred during the SARS-CoV-2 public health emergency.  Safety protocols were in place, including screening questions prior to the visit, additional usage of staff PPE, and extensive cleaning of exam room while observing appropriate contact time as indicated for disinfecting solutions.  Subjective:     Patient ID: Evelyn Orr , female    DOB: 12/15/1945 , 76 y.o.   MRN: [id number]   Chief Complaint  Patient presents with   left side pain    HPI  She presents today for further evaluation of left side pain. She is not sure what could have contributed to the pain. Described as a dull pain. No associated n/v/d.     Past Medical History:  Diagnosis Date   Arthritis    generalized   Breast cancer ([place])    Breast cancer of upper-outer quadrant of right female breast ([place]) 03/20/2015   Cough    GERD (gastroesophageal reflux disease)    uses PRN meds/with certain foods   Hypothyroidism    Nasal congestion    PONV (postoperative nausea and vomiting)    Rheumatoid arthritis(714.0)    Seasonal allergies    Seasonal allergies    Thyroid disease    hypothyroidism     Family History  Problem Relation Age of Onset   Colon polyps Mother    Hypertension Mother    Heart disease Father    Heart attack Father    Other Sister        high breast density   Colon polyps Brother    Prostate cancer Brother 26       unknown Gleason   Colon polyps Brother    Prostate cancer Maternal Uncle    Prostate cancer Maternal Uncle    Prostate cancer Maternal Uncle    Breast cancer Paternal Aunt        dx. 71 or younger   Colon cancer Paternal Uncle    Prostate cancer Maternal Grandfather        dx. older age   Colon polyps Son 74       colonoscopy for  unspecified reason; found some colon polyps, has not had one since   Breast cancer Cousin        4 maternal cousin had breast cancer    Colon cancer Other    Cancer Other        MGM's sister   Rectal cancer Neg Hx    Stomach cancer Neg Hx    Esophageal cancer Neg Hx      Current Outpatient Medications:    levothyroxine (SYNTHROID) 100 MCG tablet, Take 100 mcg by mouth See admin instructions. On Saturdays and Sundays, Disp: , Rfl:    methotrexate (RHEUMATREX) 2.5 MG tablet, Take 20 mg by mouth once a week. 8 tablets weekly, Disp: , Rfl: 1   sulfaSALAzine (AZULFIDINE) 500 MG tablet, Take 1,000 mg by mouth 2 (two) times daily., Disp: , Rfl:    albuterol (VENTOLIN HFA) 108 (90 Base) MCG/ACT inhaler, Inhale 2 puffs into the lungs every 6 (six) hours as needed for wheezing or shortness of breath. (Patient not taking: Reported on 04/16/2021), Disp: 18 g, Rfl: 1   benzonatate (TESSALON) 100 MG capsule, Take 1 capsule (100 mg total) by mouth 2 (two) times daily  as needed for cough. (Patient not taking: Reported on 04/16/2021), Disp: 30 capsule, Rfl: 1   levothyroxine (SYNTHROID) 88 MCG tablet, Take 1 tablet (88 mcg total) by mouth See admin instructions. Monday-Friday, Disp: 90 tablet, Rfl: 2   mometasone (NASONEX) 50 MCG/ACT nasal spray, Place 2 sprays into the nose daily. (Patient not taking: Reported on 04/16/2021), Disp: 17 g, Rfl: 2   triamcinolone (KENALOG) 0.1 %, Apply 1 application topically daily as needed (itching). (Patient not taking: Reported on 04/16/2021), Disp: , Rfl:    No Known Allergies   Review of Systems  Constitutional: Negative.   Respiratory: Negative.    Cardiovascular: Negative.   Gastrointestinal: Negative.   Neurological: Negative.   Psychiatric/Behavioral: Negative.      Today's Vitals   07/14/21 1404  BP: 124/82  Pulse: 70  Temp: 98.5 F (36.9 C)  Weight: 153 lb 6.4 oz (69.6 kg)  Height: 5' 1.8" (1.57 m)  PainSc: 0-No pain   Body mass index is 28.24  kg/m.  Wt Readings from Last 3 Encounters:  07/14/21 153 lb 6.4 oz (69.6 kg)  05/14/21 159 lb 12.8 oz (72.5 kg)  05/07/21 158 lb 6.4 oz (71.8 kg)    BP Readings from Last 3 Encounters:  07/14/21 124/82  05/14/21 118/64  05/07/21 126/80    Objective:  Physical Exam Vitals and nursing note reviewed.  Constitutional:      Appearance: Normal appearance.  HENT:     Head: Normocephalic and atraumatic.     Nose:     Comments: Masked     Mouth/Throat:     Comments: Masked  Eyes:     Extraocular Movements: Extraocular movements intact.  Cardiovascular:     Rate and Rhythm: Normal rate and regular rhythm.     Heart sounds: Normal heart sounds.  Pulmonary:     Effort: Pulmonary effort is normal.     Breath sounds: Normal breath sounds.  Musculoskeletal:     Cervical back: Normal range of motion.  Skin:    General: Skin is warm.  Neurological:     General: No focal deficit present.     Mental Status: She is alert.  Psychiatric:        Mood and Affect: Mood normal.        Behavior: Behavior normal.        Assessment And Plan:    1. Acute left flank pain Comments: I will check urinalysis today to look for blood. Sx have almost completely resolved. No rx meds needed at this time. She will let me know if her sx recur.  - POCT Urinalysis Dipstick (81002)  2. Abnormal urine Comments: Proteinuria is present today. I will recheck at her next visit.   3. History of breast cancer     Patient was given opportunity to ask questions. Patient verbalized understanding of the plan and was able to repeat key elements of the plan. All questions were answered to their satisfaction.   I, Maximino Greenland, MD, have reviewed all documentation for this visit. The documentation on 07/14/21 for the exam, diagnosis, procedures, and orders are all accurate and complete.   IF YOU HAVE BEEN REFERRED TO A SPECIALIST, IT MAY TAKE 1-2 WEEKS TO SCHEDULE/PROCESS THE REFERRAL. IF YOU HAVE NOT HEARD  FROM US/SPECIALIST IN TWO WEEKS, PLEASE GIVE [place] A CALL AT [phone].   THE PATIENT IS ENCOURAGED TO PRACTICE SOCIAL DISTANCING DUE TO THE COVID-19 PANDEMIC.

## 2021-07-18 ENCOUNTER — Other Ambulatory Visit: Payer: Self-pay | Admitting: Internal Medicine

## 2021-08-05 ENCOUNTER — Ambulatory Visit (INDEPENDENT_AMBULATORY_CARE_PROVIDER_SITE_OTHER): Payer: Medicare PPO | Admitting: Podiatry

## 2021-08-05 ENCOUNTER — Other Ambulatory Visit: Payer: Self-pay

## 2021-08-05 ENCOUNTER — Encounter: Payer: Self-pay | Admitting: Podiatry

## 2021-08-05 DIAGNOSIS — L84 Corns and callosities: Secondary | ICD-10-CM | POA: Diagnosis not present

## 2021-08-05 DIAGNOSIS — M79672 Pain in left foot: Secondary | ICD-10-CM

## 2021-08-05 DIAGNOSIS — Z79899 Other long term (current) drug therapy: Secondary | ICD-10-CM | POA: Insufficient documentation

## 2021-08-05 DIAGNOSIS — M1990 Unspecified osteoarthritis, unspecified site: Secondary | ICD-10-CM | POA: Insufficient documentation

## 2021-08-11 NOTE — Progress Notes (Signed)
?  Subjective:  ?Patient ID: Evelyn Orr, female    DOB: [birth year],  MRN: [id number] ? ?Evelyn Orr presents to clinic today for callus(es) left foot. Aggravating factors include weightbearing with and without shoe gear. Pain is relieved with periodic professional debridement. ? ?New problem(s): None.  ? ?PCP is [name], MD , and last visit was July 14, 2021. ? ?No Known Allergies ? ?Review of Systems: Negative except as noted in the HPI. ? ?Objective: No changes noted in today's physical examination. ? ?Constitutional Evelyn Orr is a pleasant 77 y.o. African American female, WD, WN in NAD. AAO x 3.   ?Vascular CFT immediate b/l LE. Palpable DP/PT pulses b/l LE. Digital hair present b/l. Skin temperature gradient WNL b/l. No pain with calf compression b/l. No edema noted b/l. No cyanosis or clubbing noted b/l LE.  ?Neurologic Normal speech. Oriented to person, place, and time. Protective sensation intact 5/5 intact bilaterally with 10g monofilament b/l. Vibratory sensation intact b/l.  ?Dermatologic Pedal integument with normal turgor, texture and tone BLE. No open wounds b/l LE. Toenails 1-5 bilaterally well maintained with adequate length. No erythema, no edema, no drainage, no fluctuance. Hyperkeratotic lesion(s) submet head 5 left foot.  No erythema, no edema, no drainage, no fluctuance.  ?Orthopedic: Normal muscle strength 5/5 to all lower extremity muscle groups bilaterally. No pain, crepitus or joint limitation noted with ROM b/l LE. No gross bony pedal deformities b/l. Patient ambulates independently without assistive aids.  ? ?Radiographs: None ? ?Assessment/Plan: ?1. Callus   ?2. Left foot pain   ?  ?-Examined patient. ?-No new findings. No new orders. ?-Callus(es) submet head 5 left foot pared utilizing sterile scalpel blade without complication or incident. Total number debrided =1. ?-Patient/POA to call should there be question/concern in the interim.  ? ?Return in  about 3 months (around 11/05/2021). ? ?Marzetta Board, DPM  ?

## 2021-08-24 ENCOUNTER — Ambulatory Visit (INDEPENDENT_AMBULATORY_CARE_PROVIDER_SITE_OTHER): Payer: Medicare PPO | Admitting: Podiatry

## 2021-08-24 ENCOUNTER — Encounter: Payer: Self-pay | Admitting: Podiatry

## 2021-08-24 ENCOUNTER — Other Ambulatory Visit: Payer: Self-pay

## 2021-08-24 DIAGNOSIS — R2242 Localized swelling, mass and lump, left lower limb: Secondary | ICD-10-CM

## 2021-08-24 DIAGNOSIS — G5762 Lesion of plantar nerve, left lower limb: Secondary | ICD-10-CM | POA: Diagnosis not present

## 2021-08-24 NOTE — Progress Notes (Signed)
?Subjective:  ?Patient ID: Evelyn Orr, female    DOB: 07-08-1945,  MRN: [id number] ? ?Chief Complaint  ?Patient presents with  ? Peripheral Neuropathy  ?    NEUROPATHY IN LEFT FOOT  ? ? ?76 y.o. female presents with the above complaint. History confirmed with patient.  She returns today for follow-up of a painful spot on the left foot.  Now the most of the pain is between the second and third toes feels like there is a lump underneath her like she is walking on a rock.  Also a spot on the outside of the foot that is painful she thinks he is walking toward the outside of the foot and overloading it ? ?Objective:  ?Physical Exam: ?warm, good capillary refill, no trophic changes or ulcerative lesions, normal DP and PT pulses, normal sensory exam, and she is sharp pain on palpation to the second or space with a positive Mulder's click, there is a positive Sullivan's sign here as well with deviation of the toes.  There is a palpable firm mass under the fifth metatarsal that is painful as well ? ?Study Result ? ?Narrative & Impression  ?CLINICAL DATA:  Plantar left forefoot pain. Suspected Morton's ?neuroma. ?  ?EXAM: ?MRI OF THE LEFT FOOT WITHOUT CONTRAST ?  ?TECHNIQUE: ?Multiplanar, multisequence MR imaging of the left forefoot was ?performed. No intravenous contrast was administered. ?  ?COMPARISON:  X-ray 06/23/2020 ?  ?FINDINGS: ?Bones/Joint/Cartilage ?  ?No acute fracture or dislocation. Mild hallux valgus deformity with ?mild-moderate osteoarthritis of the first MTP joint well-defined ?cystic structures along the medial aspect of the first metatarsal ?head which may reflect small erosions. Prominent subchondral cyst or ?intraosseous ganglion within the medial aspect of the navicular. ?Scattered mild degenerative changes within the midfoot. No bone ?marrow edema or periostitis. Small fifth MTP joint effusion. ?  ?Ligaments ?  ?Intact Lisfranc ligament. No evidence of collateral ligament injury. ?  ?Muscles  and Tendons ?  ?Tendinosis and mild tenosynovitis of the flexor hallucis longus ?tendon (series 5, images 16-20). Suspected partial longitudinal tear ?within the FHL tendon at the level of the first metatarsal neck. ?Remaining flexor and extensor tendons appear intact. Preserved ?muscle bulk and signal intensity. ?  ?Soft tissues ?  ?12 x 5 mm dumbbell-shaped T1/T2 hypointense soft tissue mass within ?the first intermetatarsal space (series 4, image 26). Small volume ?of fluid within the second intermetatarsal space. Complex T2 ?heterogeneously hyperintense fluid collection within the plantar ?foot pad underlying the fifth metatarsal head with low T1/T2 signal ?intensity rim measuring approximately 2.4 x 1.7 cm (series 5, images ?19-22). No evidence of overlying soft tissue ulceration. Minimal ?dorsal subcutaneous edema. ?  ?IMPRESSION: ?1. Findings suggestive of a small neuroma within the first ?intermetatarsal space. ?2. Complex 2.4 x 1.7 cm fluid collection within the plantar foot pad ?underlying the fifth metatarsal head. Findings are favored to ?reflect an adventitial bursa. Correlate clinically to exclude ?evidence of soft tissue infection at this location. ?3. Tendinosis and mild tenosynovitis of the flexor hallucis longus ?tendon. Suspected partial longitudinal tear within the FHL tendon at ?the level of the first metatarsal neck. ?4. Mild second intermetatarsal space bursitis. ?5. Mild hallux valgus deformity with mild-moderate osteoarthritis of ?the first MTP joint. Probable small erosions along the medial aspect ?of the first metatarsal head suggesting sequela of crystalline ?arthropathy such as gout. ?  ?  ?Electronically Signed ?  By: Davina Poke D.O. ?  On: 07/31/2020 10:32  ? ?Assessment:  ? ?1. Plantar  neuroma of left foot   ?2. Mass of left foot   ? ? ? ?Plan:  ?Patient was evaluated and treated and all questions answered. ? ?I reviewed her clinical progress unfortunately she has not improved.   She only had temporary relief from the previous injections last year.  Her MRI last year did show an neuroma in the first interspace but today her pain is more in the second interspace of the second and third toes.  We discussed permanent solution and treatment of this including surgical excision.  I recommended a new MRI for surgical planning prior to proceeding with surgical intervention to reevaluate the location and size.  There is also a small painful mass in the area of the fifth metatarsal that is painful for her.  I will see her back after the MRI and we will plan for surgical excision.  We discussed what surgical excision would entail including the postoperative recovery process and period of weightbearing in a protective shoe ? ?Return for after MRI to review.  ? ?

## 2021-08-24 NOTE — Patient Instructions (Signed)
Call [facility] at [phone] to schedule your MRI ? ?

## 2021-09-07 ENCOUNTER — Other Ambulatory Visit: Payer: 59

## 2021-09-09 ENCOUNTER — Inpatient Hospital Stay: Admission: RE | Admit: 2021-09-09 | Payer: 59 | Source: Ambulatory Visit

## 2021-09-23 ENCOUNTER — Telehealth: Payer: Self-pay

## 2021-09-23 ENCOUNTER — Ambulatory Visit
Admission: RE | Admit: 2021-09-23 | Discharge: 2021-09-23 | Disposition: A | Discharge status: Home / Self Care | Payer: Medicare PPO | Source: Ambulatory Visit | Attending: Podiatry | Admitting: Podiatry

## 2021-09-23 DIAGNOSIS — R2242 Localized swelling, mass and lump, left lower limb: Secondary | ICD-10-CM

## 2021-09-23 DIAGNOSIS — G5762 Lesion of plantar nerve, left lower limb: Secondary | ICD-10-CM

## 2021-09-23 NOTE — Telephone Encounter (Signed)
Spoke to patient - she went for her MRI W& W/O contrast this morning - after trying 4 times, they could not draw her blood and told her she would have to get this done at the hospital.  ? ?Please advise ?

## 2021-09-24 ENCOUNTER — Telehealth: Payer: Self-pay | Admitting: Podiatry

## 2021-09-24 NOTE — Telephone Encounter (Signed)
Pt went to the image center in [place] and they said they cant do it she needs to go to the hospital to draw blood.Pt is waiting to hear back from [facility] with further instructions.  ? ?Please advise ?

## 2021-09-28 ENCOUNTER — Telehealth: Payer: Self-pay | Admitting: *Deleted

## 2021-09-28 NOTE — Telephone Encounter (Signed)
Patient has been rescheduled for MRI /foot, May 4th @ 4:00,arrival time : 3:30, [facility]. Patient has been notified and given number if she needs to reschedule. ?[facility] unable to perform study, could not find vein,said that patient needed a special type of needle,had to cancel appointment. ?

## 2021-09-28 NOTE — Telephone Encounter (Signed)
Is this for patient's blood work to be done?

## 2021-09-28 NOTE — Telephone Encounter (Signed)
Just wanted to follow-up on this pt. ? ?

## 2021-09-28 NOTE — Telephone Encounter (Signed)
[facility] Patient has to scheduled at the hospital,changed the order in epic, called [facility] to see if they can schedule the appointment. ?Patient has been scheduled 10/01/21,notified

## 2021-09-29 ENCOUNTER — Telehealth: Payer: Self-pay | Admitting: *Deleted

## 2021-09-29 NOTE — Telephone Encounter (Signed)
Patient's MRI has been changed to [facility], has been updated w/ insurance, Authorization # ; [id number], valid from 09/23/21-10/23/21,contact: Essence R.[facility] has been notified 09/29/21. ?

## 2021-09-30 DIAGNOSIS — Z1152 Encounter for screening for COVID-19: Secondary | ICD-10-CM | POA: Diagnosis not present

## 2021-10-01 ENCOUNTER — Ambulatory Visit (HOSPITAL_COMMUNITY)
Admission: RE | Admit: 2021-10-01 | Discharge: 2021-10-01 | Disposition: A | Discharge status: Home / Self Care | Payer: Medicare PPO | Source: Ambulatory Visit | Attending: Podiatry | Admitting: Podiatry

## 2021-10-01 DIAGNOSIS — M65872 Other synovitis and tenosynovitis, left ankle and foot: Secondary | ICD-10-CM | POA: Diagnosis not present

## 2021-10-01 DIAGNOSIS — R2242 Localized swelling, mass and lump, left lower limb: Secondary | ICD-10-CM | POA: Insufficient documentation

## 2021-10-01 DIAGNOSIS — G5762 Lesion of plantar nerve, left lower limb: Secondary | ICD-10-CM | POA: Diagnosis not present

## 2021-10-01 DIAGNOSIS — M2012 Hallux valgus (acquired), left foot: Secondary | ICD-10-CM | POA: Diagnosis not present

## 2021-10-01 DIAGNOSIS — M25475 Effusion, left foot: Secondary | ICD-10-CM | POA: Diagnosis not present

## 2021-10-01 MED ORDER — GADOBUTROL 1 MMOL/ML IV SOLN
8.0000 mL | Freq: Once | INTRAVENOUS | Status: AC | PRN
  Administered 2021-10-01: 8 mL via INTRAVENOUS

## 2021-10-06 DIAGNOSIS — Z1152 Encounter for screening for COVID-19: Secondary | ICD-10-CM | POA: Diagnosis not present

## 2021-10-08 ENCOUNTER — Ambulatory Visit (INDEPENDENT_AMBULATORY_CARE_PROVIDER_SITE_OTHER): Payer: Medicare PPO | Admitting: Podiatry

## 2021-10-08 DIAGNOSIS — M71172 Other infective bursitis, left ankle and foot: Secondary | ICD-10-CM

## 2021-10-08 DIAGNOSIS — M869 Osteomyelitis, unspecified: Secondary | ICD-10-CM | POA: Diagnosis not present

## 2021-10-09 ENCOUNTER — Telehealth: Payer: Self-pay

## 2021-10-09 NOTE — Telephone Encounter (Signed)
DOS 10/16/2021 ? ?I&D LT - 28003 ?BONE BIOPSY LT - 20225 ? ?[employer] - PRIMARY ?[place] ? ?The following codes do not require a pre-authorization ?Created on 10/09/2021 ?Payer ?[employer] ? ?Plan Year ?05/31/2021 - 05/30/9998 ? ?Service info ?[phone] ?Incision and drainage below fascia, with or without tendon sheath involvement, foot; multiple areas ?20225 ?Biopsy, bone, trocar, or needle; deep (eg, vertebral body, femur ? ?[employer]  ?SPOKE TO LYDIA AT [employer] AND SHE STATED NO PRECERT IS REQUIRED FOR CPT 20225 OR 72091. CALL REF # [id number] ?

## 2021-10-11 NOTE — Progress Notes (Signed)
?Subjective:  ?Patient ID: Evelyn Orr, female    DOB: July 20, 1945,  MRN: [id number] ? ?Chief Complaint  ?Patient presents with  ? Neuroma  ?  MRI results review  ? ? ?76 y.o. female presents with the above complaint. History confirmed with patient.  She returns today for follow-up of a painful spot on the left foot.  Now the most of the pain is between the second and third toes feels like there is a lump underneath her like she is walking on a rock.  Also a spot on the outside of the foot that is painful she thinks he is walking toward the outside of the foot and overloading it ? ?Interval history: ?She returns after completing the MRI says it is still quite painful. ?Objective:  ?Physical Exam: ?warm, good capillary refill, no trophic changes or ulcerative lesions, normal DP and PT pulses, normal sensory exam, and she has sharp pain on palpation to the second or space with a positive Mulder's click, there is a positive Sullivan's sign here as well with deviation of the toes.  There is a palpable firm mass under the fifth metatarsal that is painful as well ? ?Study Result ? ?Narrative & Impression  ?CLINICAL DATA:  Plantar left forefoot pain. Suspected Morton's ?neuroma. ?  ?EXAM: ?MRI OF THE LEFT FOOT WITHOUT CONTRAST ?  ?TECHNIQUE: ?Multiplanar, multisequence MR imaging of the left forefoot was ?performed. No intravenous contrast was administered. ?  ?COMPARISON:  X-ray 06/23/2020 ?  ?FINDINGS: ?Bones/Joint/Cartilage ?  ?No acute fracture or dislocation. Mild hallux valgus deformity with ?mild-moderate osteoarthritis of the first MTP joint well-defined ?cystic structures along the medial aspect of the first metatarsal ?head which may reflect small erosions. Prominent subchondral cyst or ?intraosseous ganglion within the medial aspect of the navicular. ?Scattered mild degenerative changes within the midfoot. No bone ?marrow edema or periostitis. Small fifth MTP joint effusion. ?  ?Ligaments ?  ?Intact  Lisfranc ligament. No evidence of collateral ligament injury. ?  ?Muscles and Tendons ?  ?Tendinosis and mild tenosynovitis of the flexor hallucis longus ?tendon (series 5, images 16-20). Suspected partial longitudinal tear ?within the FHL tendon at the level of the first metatarsal neck. ?Remaining flexor and extensor tendons appear intact. Preserved ?muscle bulk and signal intensity. ?  ?Soft tissues ?  ?12 x 5 mm dumbbell-shaped T1/T2 hypointense soft tissue mass within ?the first intermetatarsal space (series 4, image 26). Small volume ?of fluid within the second intermetatarsal space. Complex T2 ?heterogeneously hyperintense fluid collection within the plantar ?foot pad underlying the fifth metatarsal head with low T1/T2 signal ?intensity rim measuring approximately 2.4 x 1.7 cm (series 5, images ?19-22). No evidence of overlying soft tissue ulceration. Minimal ?dorsal subcutaneous edema. ?  ?IMPRESSION: ?1. Findings suggestive of a small neuroma within the first ?intermetatarsal space. ?2. Complex 2.4 x 1.7 cm fluid collection within the plantar foot pad ?underlying the fifth metatarsal head. Findings are favored to ?reflect an adventitial bursa. Correlate clinically to exclude ?evidence of soft tissue infection at this location. ?3. Tendinosis and mild tenosynovitis of the flexor hallucis longus ?tendon. Suspected partial longitudinal tear within the FHL tendon at ?the level of the first metatarsal neck. ?4. Mild second intermetatarsal space bursitis. ?5. Mild hallux valgus deformity with mild-moderate osteoarthritis of ?the first MTP joint. Probable small erosions along the medial aspect ?of the first metatarsal head suggesting sequela of crystalline ?arthropathy such as gout. ?  ?  ?Electronically Signed ?  By: Davina Poke D.O. ?  On: 07/31/2020 10:32  ? ? ?IMPRESSION: ?1. A 5 x 9 mm neuroma along the plantar aspect of the first ?intermetatarsal space. ?2. Thick-walled fluid collection between the  second and third ?metatarsal heads with severe surrounding inflammatory changes ?concerning for an intermetatarsal bursa which may be secondarily ?infected. Severe subcortical bone marrow edema in the adjacent ?medial base of the third proximal phalanx with cortical irregularity ?concerning for osteomyelitis versus reactive edema. ?3. Moderate fifth MTP joint effusion with synovitis and marrow edema ?on either side of the joint which may be secondary to an ?inflammatory or crystalline arthropathy versus septic arthritis. ?4. Severe tendinosis and mild tenosynovitis of the flexor hallucis ?longus tendon. ?  ?  ?Electronically Signed ?  By: Kathreen Devoid M.D. ?  On: 10/02/2021 09:06 ?Assessment:  ? ?1. Other infective bursitis, left ankle and foot   ?2. Osteomyelitis of toe of left foot ([place])   ? ? ? ?Plan:  ?Patient was evaluated and treated and all questions answered. ? ?Today I reviewed the MRI findings with her.  I discussed with her that the finding of an infectious bursa and possible osteomyelitis is quite surprising considering the time course that she has had this for and her lack of other infective clinical symptoms such as edema ulceration abscess formation or systemic signs.  I think likely this is rather an inflamed intermetatarsal bursitis, could possibly also be a neuroma that is quite large in the area as well with surrounding.  Fluid or edema.  I discussed with her however we need to treat as if it is infected and I recommend incision and drainage of the area and biopsy of the medial base of the third proximal phalanx as well.  Given her lack of clinical and systemic signs I am not putting her on antibiotics now in order to obtain a proper culture in the toe, unless there is development of these between now and surgery next week.  She understands signs and symptoms of infection and will go to the emergency room if these develop.  We discussed the risk possible benefits and potential complications of the  surgery including but not limited to  pain, swelling, infection, scar, numbness which may be temporary or permanent, chronic pain, stiffness, nerve pain or damage, wound healing problems.  We also discussed that if there is a neuroma in the area I will excise it and this will lead to permanent numbness but should alleviate pain.  She understands and wishes to proceed.  All questions were addressed.  Surgery be scheduled for next week. ? ? ?Surgical plan: ? ?Procedure: ?-I&D of left foot with excision of intermetatarsal bursa, possible neurectomy if neuroma located, biopsy of medial base of third proximal phalanx ? ?Location: ?-[facility] ? ?Anesthesia plan: ?-IV sedation with local ? ?Postoperative pain plan: ?- Tylenol 1000 mg every 6 hours, ibuprofen 600 mg every 6 hours, gabapentin 300 mg every 8 hours x5 days, oxycodone 5 mg 1-2 tabs every 6 hours only as needed ? ?DVT prophylaxis: ?-None required ? ?WB Restrictions / DME needs: ?-WBAT in CAM boot postop this was dispensed today ? ? ? ?No follow-ups on file.  ? ?

## 2021-10-16 ENCOUNTER — Other Ambulatory Visit: Payer: Self-pay | Admitting: Podiatry

## 2021-10-16 DIAGNOSIS — M868X7 Other osteomyelitis, ankle and foot: Secondary | ICD-10-CM | POA: Diagnosis not present

## 2021-10-16 DIAGNOSIS — M71572 Other bursitis, not elsewhere classified, left ankle and foot: Secondary | ICD-10-CM | POA: Diagnosis not present

## 2021-10-16 DIAGNOSIS — M86672 Other chronic osteomyelitis, left ankle and foot: Secondary | ICD-10-CM | POA: Diagnosis not present

## 2021-10-16 DIAGNOSIS — M67879 Other specified disorders of synovium and tendon, unspecified ankle and foot: Secondary | ICD-10-CM | POA: Diagnosis not present

## 2021-10-16 DIAGNOSIS — M71172 Other infective bursitis, left ankle and foot: Secondary | ICD-10-CM | POA: Diagnosis not present

## 2021-10-16 DIAGNOSIS — M65872 Other synovitis and tenosynovitis, left ankle and foot: Secondary | ICD-10-CM | POA: Diagnosis not present

## 2021-10-16 DIAGNOSIS — G5762 Lesion of plantar nerve, left lower limb: Secondary | ICD-10-CM | POA: Diagnosis not present

## 2021-10-16 MED ORDER — DOXYCYCLINE HYCLATE 100 MG PO TABS: 100.0000 mg | ORAL_TABLET | Freq: Two times a day (BID) | ORAL | 0 refills | Status: DC

## 2021-10-16 MED ORDER — HYDROCODONE-ACETAMINOPHEN 5-325 MG PO TABS: ORAL_TABLET | ORAL | 0 refills | Status: DC

## 2021-10-16 NOTE — Progress Notes (Signed)
5/19 I&D of bursa, bone biopsy, neurectomy

## 2021-10-20 DIAGNOSIS — Z1152 Encounter for screening for COVID-19: Secondary | ICD-10-CM | POA: Diagnosis not present

## 2021-10-22 ENCOUNTER — Ambulatory Visit (INDEPENDENT_AMBULATORY_CARE_PROVIDER_SITE_OTHER): Payer: Medicare PPO | Admitting: Podiatry

## 2021-10-22 DIAGNOSIS — M869 Osteomyelitis, unspecified: Secondary | ICD-10-CM

## 2021-10-22 DIAGNOSIS — M71172 Other infective bursitis, left ankle and foot: Secondary | ICD-10-CM

## 2021-10-22 NOTE — Progress Notes (Signed)
  Subjective:  Patient ID: Evelyn Orr, female    DOB: [birth year],  MRN: [id number]  Chief Complaint  Patient presents with   Routine Post Op     POV #1 DOS 10/16/2021 I&D OF LEFT FOOT BURSA, BONE BIOPSY     76 y.o. female returns for post-op check.  Doing well not having much pain  Review of Systems: Negative except as noted in the HPI. Denies N/V/F/Ch.   Objective:  There were no vitals filed for this visit. There is no height or weight on file to calculate BMI. Constitutional Well developed. Well nourished.  Vascular Foot warm and well perfused. Capillary refill normal to all digits.  Calf is soft and supple, no posterior calf or knee pain, negative Homans' sign  Neurologic Normal speech. Oriented to person, place, and time. Epicritic sensation to light touch grossly present bilaterally.  Dermatologic Skin healing well without signs of infection. Skin edges well coapted without signs of infection.  Orthopedic: Tenderness to palpation noted about the surgical site.  Mild edema    Assessment:   1. Other infective bursitis, left ankle and foot   2. Osteomyelitis of toe of left foot ([place])    Plan:  Patient was evaluated and treated and all questions answered.  S/p foot surgery left -Progressing as expected post-operatively. -WB Status: WBAT in surgical shoe -Sutures: Plan remove in 2 weeks. -Medications: No refills required -Foot redressed.  May begin bathing Monday  Return in about 2 weeks (around 11/05/2021) for post op (no x-rays), suture removal.

## 2021-11-03 ENCOUNTER — Ambulatory Visit (INDEPENDENT_AMBULATORY_CARE_PROVIDER_SITE_OTHER): Payer: Medicare PPO | Admitting: Podiatry

## 2021-11-03 DIAGNOSIS — M7752 Other enthesopathy of left foot: Secondary | ICD-10-CM

## 2021-11-05 ENCOUNTER — Ambulatory Visit: Payer: 59 | Admitting: Internal Medicine

## 2021-11-09 ENCOUNTER — Encounter: Payer: Self-pay | Admitting: Podiatry

## 2021-11-09 ENCOUNTER — Ambulatory Visit (INDEPENDENT_AMBULATORY_CARE_PROVIDER_SITE_OTHER): Payer: Medicare PPO | Admitting: Podiatry

## 2021-11-09 DIAGNOSIS — M79672 Pain in left foot: Secondary | ICD-10-CM | POA: Diagnosis not present

## 2021-11-09 DIAGNOSIS — M79671 Pain in right foot: Secondary | ICD-10-CM | POA: Diagnosis not present

## 2021-11-09 DIAGNOSIS — L84 Corns and callosities: Secondary | ICD-10-CM | POA: Diagnosis not present

## 2021-11-09 DIAGNOSIS — Q828 Other specified congenital malformations of skin: Secondary | ICD-10-CM | POA: Diagnosis not present

## 2021-11-09 NOTE — Progress Notes (Signed)
  Subjective:  Patient ID: Evelyn Orr, female    DOB: 07-Dec-1945,  MRN: [id number]  Chief Complaint  Patient presents with   Routine Post Op       POV #2 DOS 10/16/2021 I&D OF LEFT FOOT BURSA, BONE BIOPSY     76 y.o. female returns for post-op check.  Still doing well pleased with the result so far of surgery.  Review of Systems: Negative except as noted in the HPI. Denies N/V/F/Ch.   Objective:  There were no vitals filed for this visit. There is no height or weight on file to calculate BMI. Constitutional Well developed. Well nourished.  Vascular Foot warm and well perfused. Capillary refill normal to all digits.  Calf is soft and supple, no posterior calf or knee pain, negative Homans' sign  Neurologic Normal speech. Oriented to person, place, and time. Epicritic sensation to light touch grossly present bilaterally.  Dermatologic Skin healing well without signs of infection. Skin edges well coapted without signs of infection.  Orthopedic: Little to no tenderness and no edema today   Pathology results showed a significant bursal tissue sac; negative for osteomyelitis  Assessment:   1. Bursitis of intermetatarsal bursa of left foot     Plan:  Patient was evaluated and treated and all questions answered.  S/p foot surgery left -Sutures removed uneventfully she may return to regular shoe gear.  Return in about 3 weeks (around 11/24/2021) for post op (no x-rays).

## 2021-11-12 ENCOUNTER — Ambulatory Visit: Payer: 59 | Admitting: Internal Medicine

## 2021-11-14 NOTE — Progress Notes (Signed)
  Subjective:  Patient ID: Evelyn Orr, female    DOB: 1946/01/25,  MRN: [id number]  Evelyn Orr presents to clinic today for painful porokeratotic lesions left lower extremity. Pain prevent(s) comfortable ambulation. Aggravating factor is weightbearing with and without shoegear.  Patient had foot surgery under care of Dr. Sherryle Lis. On 10/16/2021, she had excision of intermetatarsal bursa of left foot, bone biopsy of medial base of left 3rd proximal phalanx and neurectomy of 2nd intermetatarsal space left foot. She states she is recovering well. She will see Dr. Sherryle Lis again in a couple of weeks.  New problem(s): None.   PCP is [name], MD , and last visit was May 07, 2021.  No Known Allergies  Review of Systems: Negative except as noted in the HPI.  Objective: No changes noted in today's physical examination. Constitutional Evelyn Orr is a pleasant 76 y.o. African American female, WD, WN in NAD. AAO x 3.   Vascular CFT immediate b/l LE. Palpable DP/PT pulses b/l LE. Digital hair present b/l. Skin temperature gradient WNL b/l. No pain with calf compression b/l.Trace edema noted left forefoot near incisions consistent with postoperative healing. No cyanosis or clubbing noted b/l LE.  Neurologic Normal speech. Oriented to person, place, and time. Protective sensation intact 5/5 intact bilaterally with 10g monofilament b/l. Vibratory sensation intact b/l.  Dermatologic Longitudinal surgical incisions healing 1st and 2nd webspaces with no evidence of dehisense, no fluctuance. Pedal integument with normal turgor, texture and tone BLE. No open wounds b/l LE. Toenails 1-5 bilaterally well maintained with adequate length. No erythema, no edema, no drainage, no fluctuance. Hyperkeratotic lesion(s) submet head 5 b/l feet and plantar aspect right heel.  No erythema, no edema, no drainage, no fluctuance. Porokeratotic lesion plantar left heel.  Orthopedic: Normal muscle  strength 5/5 to all lower extremity muscle groups bilaterally. No pain, crepitus or joint limitation noted with ROM b/l LE. No gross bony pedal deformities b/l. Patient ambulates independently without assistive aids.   Radiographs: None  Assessment/Plan: 1. Callus   2. Porokeratosis   3. Pain in both feet     -Patient was evaluated and treated. All patient's and/or POA's questions/concerns answered on today's visit. -Patient recovering from left foot surgery under care of Dr. Sherryle Lis. Doing well. -Medicare ABN signed. Patient consents for services of paring of calluses  today. Copy has been placed in patient's chart. -Patient to continue soft, supportive shoe gear daily. -Callus(es) plantar heel pad of right foot and submet head 5 b/l pared utilizing sterile scalpel blade without complication or incident. Total number debrided =3. -Porokeratotic lesion(s) plantar heel pad of left foot pared and enucleated with sterile scalpel blade without incident. Total number of lesions debrided=1. -Patient/POA to call should there be question/concern in the interim.   Return in about 3 months (around 02/09/2022).  Marzetta Board, DPM

## 2021-11-17 ENCOUNTER — Encounter: Payer: Medicare PPO | Admitting: Podiatry

## 2021-11-17 DIAGNOSIS — Z1152 Encounter for screening for COVID-19: Secondary | ICD-10-CM | POA: Diagnosis not present

## 2021-11-24 ENCOUNTER — Ambulatory Visit (INDEPENDENT_AMBULATORY_CARE_PROVIDER_SITE_OTHER): Payer: Medicare PPO | Admitting: Podiatry

## 2021-11-24 DIAGNOSIS — M7752 Other enthesopathy of left foot: Secondary | ICD-10-CM

## 2021-12-08 ENCOUNTER — Encounter: Payer: Medicare PPO | Admitting: Podiatry

## 2021-12-10 ENCOUNTER — Telehealth: Payer: Self-pay | Admitting: Hematology

## 2021-12-10 NOTE — Telephone Encounter (Signed)
Left message with rescheduled upcoming appointment due to provider's PAL. 

## 2021-12-24 DIAGNOSIS — M8580 Other specified disorders of bone density and structure, unspecified site: Secondary | ICD-10-CM | POA: Diagnosis not present

## 2021-12-24 DIAGNOSIS — Z79899 Other long term (current) drug therapy: Secondary | ICD-10-CM | POA: Diagnosis not present

## 2021-12-24 DIAGNOSIS — M79643 Pain in unspecified hand: Secondary | ICD-10-CM | POA: Diagnosis not present

## 2021-12-24 DIAGNOSIS — M0589 Other rheumatoid arthritis with rheumatoid factor of multiple sites: Secondary | ICD-10-CM | POA: Diagnosis not present

## 2021-12-24 DIAGNOSIS — M1990 Unspecified osteoarthritis, unspecified site: Secondary | ICD-10-CM | POA: Diagnosis not present

## 2021-12-28 ENCOUNTER — Encounter: Payer: Self-pay | Admitting: Nurse Practitioner

## 2021-12-28 ENCOUNTER — Ambulatory Visit (INDEPENDENT_AMBULATORY_CARE_PROVIDER_SITE_OTHER): Payer: Medicare PPO | Admitting: Nurse Practitioner

## 2021-12-28 VITALS — BP 110/60 | HR 75 | Temp 98.2°F | Ht 61.0 in | Wt 156.8 lb

## 2021-12-28 DIAGNOSIS — M05741 Rheumatoid arthritis with rheumatoid factor of right hand without organ or systems involvement: Secondary | ICD-10-CM

## 2021-12-28 DIAGNOSIS — E038 Other specified hypothyroidism: Secondary | ICD-10-CM

## 2021-12-28 DIAGNOSIS — M05742 Rheumatoid arthritis with rheumatoid factor of left hand without organ or systems involvement: Secondary | ICD-10-CM

## 2021-12-28 NOTE — Patient Instructions (Signed)
Hyperthyroidism  Hyperthyroidism is when the thyroid gland is too active (overactive). The thyroid gland is a small gland located in the lower front part of the neck, just in front of the windpipe (trachea). This gland makes hormones that help control how the body uses food for energy (metabolism) as well as how the heart and brain function. These hormones also play a role in keeping your bones strong. When the thyroid is overactive, it produces too much of a hormone called thyroxine. What are the causes? This condition may be caused by: Graves' disease. This is a disorder in which the body's disease-fighting system (immune system) attacks the thyroid gland. This is the most common cause. Inflammation of the thyroid gland. A tumor in the thyroid gland. Use of certain medicines, including: Prescription thyroid hormone replacement. Herbal supplements that mimic thyroid hormones. Amiodarone therapy. Solid or fluid-filled lumps within your thyroid gland (thyroid nodules). Taking in a large amount of iodine from foods or medicines. What increases the risk? You are more likely to develop this condition if: You are female. You have a family history of thyroid conditions. You smoke tobacco. You use a medicine called lithium. You take medicines that affect the immune system (immunosuppressants). What are the signs or symptoms? Symptoms of this condition include: Nervousness. Inability to tolerate heat. Unexplained weight loss. Diarrhea. Change in the texture of hair or skin. Heart skipping beats or making extra beats. Rapid heart rate. Loss of menstruation. Shaky hands. Fatigue. Restlessness. Sleep problems. Enlarged thyroid gland or a lump in the thyroid (nodule). You may also have symptoms of Graves' disease, which may include: Protruding eyes. Dry eyes. Red or swollen eyes. Problems with vision. How is this diagnosed? This condition may be diagnosed based on: Your symptoms and  medical history. A physical exam. Blood tests. Thyroid ultrasound. This test involves using sound waves to produce images of the thyroid gland. A thyroid scan. A radioactive substance is injected into a vein, and images show how much iodine is present in the thyroid. Radioactive iodine uptake test (RAIU). A small amount of radioactive iodine is given by mouth to see how much iodine the thyroid absorbs after a certain amount of time. How is this treated? Treatment depends on the cause and severity of the condition. Treatment may include: Medicines to reduce the amount of thyroid hormone your body makes. Radioactive iodine treatment (radioiodine therapy). This involves swallowing a small dose of radioactive iodine, in capsule or liquid form, to kill thyroid cells. Surgery to remove part or all of your thyroid gland. You may need to take thyroid hormone replacement medicine for the rest of your life after thyroid surgery. Medicines to help manage your symptoms. Follow these instructions at home:  Take over-the-counter and prescription medicines only as told by your health care provider. Do not use any products that contain nicotine or tobacco, such as cigarettes and e-cigarettes. If you need help quitting, ask your health care provider. Follow any instructions from your health care provider about diet. You may be instructed to limit foods that contain iodine. Keep all follow-up visits as told by your health care provider. This is important. You will need to have blood tests regularly so that your health care provider can monitor your condition. Contact a health care provider if: Your symptoms do not get better with treatment. You have a fever. You are taking thyroid hormone replacement medicine and you: Have symptoms of depression. Feel like you are tired all the time. Gain weight. Get help   right away if: You have chest pain. You have decreased alertness or a change in your awareness. You  have abdominal pain. You feel dizzy. You have a rapid heartbeat. You have an irregular heartbeat. You have difficulty breathing. Summary The thyroid gland is a small gland located in the lower front part of the neck, just in front of the windpipe (trachea). Hyperthyroidism is when the thyroid gland is too active (overactive) and produces too much of a hormone called thyroxine. The most common cause is Graves' disease, a disorder in which your immune system attacks the thyroid gland. Hyperthyroidism can cause various symptoms, such as unexplained weight loss, nervousness, inability to tolerate heat, or changes in your heartbeat. Treatment may include medicine to reduce the amount of thyroid hormone your body makes, radioiodine therapy, surgery, or medicines to manage symptoms. This information is not intended to replace advice given to you by your health care provider. Make sure you discuss any questions you have with your health care provider. Document Revised: 05/29/2021 Document Reviewed: 01/31/2020 Elsevier Patient Education  2023 [employer] Inc.  

## 2021-12-28 NOTE — Progress Notes (Signed)
Evelyn Orr,acting as a Education administrator for Minette Brine, FNP.,have documented all relevant documentation on the behalf of Minette Brine, FNP,as directed by  Minette Brine, FNP while in the presence of Minette Brine, [place].    Subjective:     Patient ID: Evelyn Orr , female    DOB: September 12, 1945 , 76 y.o.   MRN: [id number]   Chief Complaint  Patient presents with   Hypothyroidism    HPI  Patient presents today for a thyroid check, patient is complaint with her medication. Patient doesn't have any other concerns today. Patient has lab from her rheumatology doctor. She is on prednisone at this time due to increasing pain to her hands.  BP Readings from Last 3 Encounters: 12/28/21 : 110/60 07/14/21 : 124/82 05/14/21 : 118/64       Past Medical History:  Diagnosis Date   Arthritis    generalized   Breast cancer ([place])    Breast cancer of upper-outer quadrant of right female breast ([place]) 03/20/2015   Cough    GERD (gastroesophageal reflux disease)    uses PRN meds/with certain foods   Hypothyroidism    Nasal congestion    PONV (postoperative nausea and vomiting)    Rheumatoid arthritis(714.0)    Seasonal allergies    Seasonal allergies    Thyroid disease    hypothyroidism     Family History  Problem Relation Age of Onset   Colon polyps Mother    Hypertension Mother    Heart disease Father    Heart attack Father    Other Sister        high breast density   Colon polyps Brother    Prostate cancer Brother 19       unknown Gleason   Colon polyps Brother    Prostate cancer Maternal Uncle    Prostate cancer Maternal Uncle    Prostate cancer Maternal Uncle    Breast cancer Paternal Aunt        dx. 33 or younger   Colon cancer Paternal Uncle    Prostate cancer Maternal Grandfather        dx. older age   Colon polyps Son 1       colonoscopy for unspecified reason; found some colon polyps, has not had one since   Breast cancer Cousin        4 maternal cousin had breast  cancer    Colon cancer Other    Cancer Other        MGM's sister   Rectal cancer Neg Hx    Stomach cancer Neg Hx    Esophageal cancer Neg Hx      Current Outpatient Medications:    albuterol (VENTOLIN HFA) 108 (90 Base) MCG/ACT inhaler, Inhale 2 puffs into the lungs every 6 (six) hours as needed for wheezing or shortness of breath., Disp: 18 g, Rfl: 1   anastrozole (ARIMIDEX) 1 MG tablet, 1 tablet, Disp: , Rfl:    benzonatate (TESSALON) 100 MG capsule, Take 1 capsule (100 mg total) by mouth 2 (two) times daily as needed for cough., Disp: 30 capsule, Rfl: 1   Cetirizine HCl 10 MG CAPS, cetirizine 10 mg capsule  Take by oral route., Disp: , Rfl:    folic acid (FOLVITE) 1 MG tablet, 1 tablet, Disp: , Rfl:    levothyroxine (SYNTHROID) 100 MCG tablet, Take 100 mcg by mouth See admin instructions. On Saturdays and Sundays, Disp: , Rfl:    levothyroxine (SYNTHROID) 88 MCG tablet, Take 1  tablet (88 mcg total) by mouth See admin instructions. Monday-Friday, Disp: 90 tablet, Rfl: 2   methotrexate (RHEUMATREX) 2.5 MG tablet, Take 20 mg by mouth once a week. 8 tablets weekly, Disp: , Rfl: 1   mometasone (NASONEX) 50 MCG/ACT nasal spray, Place 2 sprays into the nose daily., Disp: 17 g, Rfl: 2   nystatin (MYCOSTATIN/NYSTOP) powder, APPLY TO AFFECTED AREA TWICE A DAY, Disp: , Rfl:    potassium chloride (KLOR-CON) 20 MEQ packet, Klor-Con 20 mEq oral packet  Take 1 packet 4 times a day by oral route., Disp: , Rfl:    sulfaSALAzine (AZULFIDINE) 500 MG tablet, Take 1,000 mg by mouth 2 (two) times daily., Disp: , Rfl:    doxycycline (VIBRA-TABS) 100 MG tablet, Take 1 tablet (100 mg total) by mouth 2 (two) times daily. (Patient not taking: Reported on 12/28/2021), Disp: 20 tablet, Rfl: 0   HYDROcodone-acetaminophen (NORCO/VICODIN) 5-325 MG tablet, Take one to two tablets every six to eight hours as needed for pain. (Patient not taking: Reported on 12/28/2021), Disp: 20 tablet, Rfl: 0   triamcinolone (KENALOG) 0.1  %, Apply 1 application topically daily as needed (itching). (Patient not taking: Reported on 12/28/2021), Disp: , Rfl:    No Known Allergies   Review of Systems  Constitutional: Negative.   HENT: Negative.    Eyes: Negative.   Respiratory: Negative.    Cardiovascular: Negative.   Gastrointestinal: Negative.   Endocrine: Negative.   Genitourinary: Negative.   Musculoskeletal: Negative.   Skin: Negative.   Allergic/Immunologic: Negative.   Neurological: Negative.   Hematological: Negative.   Psychiatric/Behavioral: Negative.       Today's Vitals   12/28/21 1202  BP: 110/60  Pulse: 75  Temp: 98.2 F (36.8 C)  TempSrc: Oral  Weight: 156 lb 12.8 oz (71.1 kg)  Height: 5' 1"  (1.549 m)  PainSc: 0-No pain   Body mass index is 29.63 kg/m.  Wt Readings from Last 3 Encounters:  12/28/21 156 lb 12.8 oz (71.1 kg)  07/14/21 153 lb 6.4 oz (69.6 kg)  05/14/21 159 lb 12.8 oz (72.5 kg)     Objective:  Physical Exam Vitals reviewed.  Constitutional:      General: She is not in acute distress.    Appearance: Normal appearance. She is well-developed.  HENT:     Head: Normocephalic and atraumatic.  Eyes:     Pupils: Pupils are equal, round, and reactive to light.  Cardiovascular:     Rate and Rhythm: Normal rate and regular rhythm.     Pulses: Normal pulses.     Heart sounds: Normal heart sounds. No murmur heard. Pulmonary:     Effort: Pulmonary effort is normal. No respiratory distress.     Breath sounds: Normal breath sounds. No wheezing.  Musculoskeletal:        General: No tenderness. Normal range of motion.  Skin:    General: Skin is warm and dry.     Capillary Refill: Capillary refill takes less than 2 seconds.  Neurological:     General: No focal deficit present.     Mental Status: She is alert and oriented to person, place, and time.     Cranial Nerves: No cranial nerve deficit.  Psychiatric:        Mood and Affect: Mood normal.        Behavior: Behavior normal.         Thought Content: Thought content normal.        Judgment: Judgment normal.  Assessment And Plan:     1. Other specified hypothyroidism Comments: Stable, continue current medications  - TSH  2. Rheumatoid arthritis involving both hands with positive rheumatoid factor ([place]) Comments: Continue follow up with Rheumatology, will check labs requested by Rheumatology and include with the results. - Sed Rate (ESR) - CRP (C-Reactive Protein) - CMP14+EGFR - CBC with Diff - T4, Free     Patient was given opportunity to ask questions. Patient verbalized understanding of the plan and was able to repeat key elements of the plan. All questions were answered to their satisfaction.  Minette Brine, FNP   I, Minette Brine, FNP, have reviewed all documentation for this visit. The documentation on 12/28/21 for the exam, diagnosis, procedures, and orders are all accurate and complete.   IF YOU HAVE BEEN REFERRED TO A SPECIALIST, IT MAY TAKE 1-2 WEEKS TO SCHEDULE/PROCESS THE REFERRAL. IF YOU HAVE NOT HEARD FROM US/SPECIALIST IN TWO WEEKS, PLEASE GIVE [place] A CALL AT [phone].   THE PATIENT IS ENCOURAGED TO PRACTICE SOCIAL DISTANCING DUE TO THE COVID-19 PANDEMIC.

## 2021-12-29 LAB — CMP14+EGFR
ALT: 9 IU/L (ref 0–32)
AST: 21 IU/L (ref 0–40)
Albumin/Globulin Ratio: 1 — ABNORMAL LOW (ref 1.2–2.2)
Albumin: 4 g/dL (ref 3.8–4.8)
Alkaline Phosphatase: 111 IU/L (ref 44–121)
BUN/Creatinine Ratio: 12 (ref 12–28)
BUN: 8 mg/dL (ref 8–27)
Bilirubin Total: 0.3 mg/dL (ref 0.0–1.2)
CO2: 21 mmol/L (ref 20–29)
Calcium: 9.4 mg/dL (ref 8.7–10.3)
Chloride: 100 mmol/L (ref 96–106)
Creatinine, Ser: 0.67 mg/dL (ref 0.57–1.00)
Globulin, Total: 4.1 g/dL (ref 1.5–4.5)
Glucose: 97 mg/dL (ref 70–99)
Potassium: 4 mmol/L (ref 3.5–5.2)
Sodium: 137 mmol/L (ref 134–144)
Total Protein: 8.1 g/dL (ref 6.0–8.5)
eGFR: 92 mL/min/{1.73_m2} (ref >59)

## 2021-12-29 LAB — CBC WITH DIFFERENTIAL/PLATELET
Basophils Absolute: 0 10*3/uL (ref 0.0–0.2)
Basos: 1 %
EOS (ABSOLUTE): 0 10*3/uL (ref 0.0–0.4)
Eos: 0 %
Hematocrit: 36.8 % (ref 34.0–46.6)
Hemoglobin: 11.8 g/dL (ref 11.1–15.9)
Immature Grans (Abs): 0 10*3/uL (ref 0.0–0.1)
Immature Granulocytes: 0 %
Lymphocytes Absolute: 2.3 10*3/uL (ref 0.7–3.1)
Lymphs: 34 %
MCH: 29.9 pg (ref 26.6–33.0)
MCHC: 32.1 g/dL (ref 31.5–35.7)
MCV: 93 fL (ref 79–97)
Monocytes Absolute: 0.2 10*3/uL (ref 0.1–0.9)
Monocytes: 3 %
Neutrophils Absolute: 4.1 10*3/uL (ref 1.4–7.0)
Neutrophils: 62 %
Platelets: 365 10*3/uL (ref 150–450)
RBC: 3.95 x10E6/uL (ref 3.77–5.28)
RDW: 12.9 % (ref 11.7–15.4)
WBC: 6.6 10*3/uL (ref 3.4–10.8)

## 2021-12-29 LAB — TSH: TSH: 4.3 u[IU]/mL (ref 0.450–4.500)

## 2021-12-29 LAB — C-REACTIVE PROTEIN: CRP: 7 mg/L (ref 0–10)

## 2021-12-29 LAB — SEDIMENTATION RATE: Sed Rate: 47 mm/hr — ABNORMAL HIGH (ref 0–40)

## 2021-12-29 LAB — T4, FREE: Free T4: 1.08 ng/dL (ref 0.82–1.77)

## 2022-01-11 DIAGNOSIS — M1990 Unspecified osteoarthritis, unspecified site: Secondary | ICD-10-CM | POA: Diagnosis not present

## 2022-01-11 DIAGNOSIS — M0589 Other rheumatoid arthritis with rheumatoid factor of multiple sites: Secondary | ICD-10-CM | POA: Diagnosis not present

## 2022-01-11 DIAGNOSIS — M79643 Pain in unspecified hand: Secondary | ICD-10-CM | POA: Diagnosis not present

## 2022-01-11 DIAGNOSIS — Z79899 Other long term (current) drug therapy: Secondary | ICD-10-CM | POA: Diagnosis not present

## 2022-01-11 DIAGNOSIS — M8580 Other specified disorders of bone density and structure, unspecified site: Secondary | ICD-10-CM | POA: Diagnosis not present

## 2022-01-12 DIAGNOSIS — M0589 Other rheumatoid arthritis with rheumatoid factor of multiple sites: Secondary | ICD-10-CM | POA: Diagnosis not present

## 2022-01-13 ENCOUNTER — Other Ambulatory Visit: Payer: Self-pay | Admitting: Internal Medicine

## 2022-01-26 DIAGNOSIS — Z1152 Encounter for screening for COVID-19: Secondary | ICD-10-CM | POA: Diagnosis not present

## 2022-01-26 DIAGNOSIS — M0589 Other rheumatoid arthritis with rheumatoid factor of multiple sites: Secondary | ICD-10-CM | POA: Diagnosis not present

## 2022-02-05 ENCOUNTER — Other Ambulatory Visit: Payer: Self-pay

## 2022-02-05 DIAGNOSIS — Z170 Estrogen receptor positive status [ER+]: Secondary | ICD-10-CM

## 2022-02-08 ENCOUNTER — Inpatient Hospital Stay: Payer: Medicare PPO | Attending: Hematology

## 2022-02-08 ENCOUNTER — Encounter: Payer: Self-pay | Admitting: Hematology

## 2022-02-08 ENCOUNTER — Other Ambulatory Visit: Payer: Self-pay | Admitting: Internal Medicine

## 2022-02-08 ENCOUNTER — Other Ambulatory Visit: Payer: Medicare Other

## 2022-02-08 ENCOUNTER — Ambulatory Visit: Payer: Medicare Other | Admitting: Nurse Practitioner

## 2022-02-08 ENCOUNTER — Other Ambulatory Visit: Payer: Self-pay

## 2022-02-08 ENCOUNTER — Inpatient Hospital Stay (HOSPITAL_BASED_OUTPATIENT_CLINIC_OR_DEPARTMENT_OTHER): Payer: Medicare PPO | Admitting: Hematology

## 2022-02-08 VITALS — BP 133/88 | HR 73 | Temp 98.7°F | Resp 18 | Ht 61.0 in | Wt 157.9 lb

## 2022-02-08 DIAGNOSIS — Z853 Personal history of malignant neoplasm of breast: Secondary | ICD-10-CM | POA: Insufficient documentation

## 2022-02-08 DIAGNOSIS — C50411 Malignant neoplasm of upper-outer quadrant of right female breast: Secondary | ICD-10-CM

## 2022-02-08 DIAGNOSIS — Z79899 Other long term (current) drug therapy: Secondary | ICD-10-CM | POA: Diagnosis not present

## 2022-02-08 DIAGNOSIS — M8589 Other specified disorders of bone density and structure, multiple sites: Secondary | ICD-10-CM | POA: Diagnosis not present

## 2022-02-08 DIAGNOSIS — M069 Rheumatoid arthritis, unspecified: Secondary | ICD-10-CM | POA: Diagnosis not present

## 2022-02-08 DIAGNOSIS — E0590 Thyrotoxicosis, unspecified without thyrotoxic crisis or storm: Secondary | ICD-10-CM | POA: Insufficient documentation

## 2022-02-08 DIAGNOSIS — Z170 Estrogen receptor positive status [ER+]: Secondary | ICD-10-CM | POA: Diagnosis not present

## 2022-02-08 LAB — CMP (CANCER CENTER ONLY)
ALT: 6 U/L (ref 0–44)
AST: 19 U/L (ref 15–41)
Albumin: 4.1 g/dL (ref 3.5–5.0)
Alkaline Phosphatase: 82 U/L (ref 38–126)
Anion gap: 6 (ref 5–15)
BUN: 8 mg/dL (ref 8–23)
CO2: 27 mmol/L (ref 22–32)
Calcium: 9.4 mg/dL (ref 8.9–10.3)
Chloride: 105 mmol/L (ref 98–111)
Creatinine: 0.81 mg/dL (ref 0.44–1.00)
GFR, Estimated: >60 mL/min (ref >60)
Glucose, Bld: 98 mg/dL (ref 70–99)
Potassium: 3.2 mmol/L — ABNORMAL LOW (ref 3.5–5.1)
Sodium: 138 mmol/L (ref 135–145)
Total Bilirubin: 0.5 mg/dL (ref 0.3–1.2)
Total Protein: 7.8 g/dL (ref 6.5–8.1)

## 2022-02-08 LAB — CBC WITH DIFFERENTIAL (CANCER CENTER ONLY)
Abs Immature Granulocytes: 0.01 10*3/uL (ref 0.00–0.07)
Basophils Absolute: 0.1 10*3/uL (ref 0.0–0.1)
Basophils Relative: 1 %
Eosinophils Absolute: 0 10*3/uL (ref 0.0–0.5)
Eosinophils Relative: 1 %
HCT: 40.5 % (ref 36.0–46.0)
Hemoglobin: 13 g/dL (ref 12.0–15.0)
Immature Granulocytes: 0 %
Lymphocytes Relative: 49 %
Lymphs Abs: 2.3 10*3/uL (ref 0.7–4.0)
MCH: 30.3 pg (ref 26.0–34.0)
MCHC: 32.1 g/dL (ref 30.0–36.0)
MCV: 94.4 fL (ref 80.0–100.0)
Monocytes Absolute: 0.4 10*3/uL (ref 0.1–1.0)
Monocytes Relative: 7 %
Neutro Abs: 2 10*3/uL (ref 1.7–7.7)
Neutrophils Relative %: 42 %
Platelet Count: 201 10*3/uL (ref 150–400)
RBC: 4.29 MIL/uL (ref 3.87–5.11)
RDW: 14.6 % (ref 11.5–15.5)
WBC Count: 4.8 10*3/uL (ref 4.0–10.5)
nRBC: 0 % (ref 0.0–0.2)

## 2022-02-08 NOTE — Progress Notes (Signed)
[place]   [phone] [phone]   Clinic Follow up Note   Patient Care Team: [name], MD as PCP - General (Internal Medicine) Earnstine Regal, PA-C as Physician Assistant (Obstetrics and Gynecology) Rolm Bookbinder, MD as Consulting Physician (General Surgery) Truitt Merle, MD as Consulting Physician (Hematology) Thea Silversmith, MD as Consulting Physician (Radiation Oncology) Mauro Kaufmann, RN as Registered Nurse Rockwell Germany, RN as Registered Nurse Sylvan Cheese, NP as Nurse Practitioner (Hematology and Oncology)  Date of Service:  02/08/2022  CHIEF COMPLAINT: f/u of right breast DCIS  CURRENT THERAPY:  Surveillance  ASSESSMENT & PLAN:  Evelyn Orr is a 76 y.o. post-menopausal female with   1. Right breast DCIS, low grade, ER and PR strongly positive -Diagnosed in 03/2015. S/p right lumpectomy and 5 years antiestrogen therapy (anastrozole) from 06/20/15 - 06/19/20.  She tolerated well.  Adjuvant radiation was not recommended.  -most recent mammogram 04/08/21 at [facility] was benign -she is clinically doing well. Lab reviewed, overall WNL. Physical exam was unremarkable. There is no clinical concern for recurrence. -she is almost 7 years out from diagnosis. At this point, I feel comfortable releasing her from f/u here.   2. Osteopenia -DEXA from 05/30/17 and 05/2018 showed stable in AP spine (T-score -2.4), but decrease in b/l hips, she remains osteopenic. -Repeat DEXA 04/08/21 showed significant improvement to -1.5 at AP spine. -Continue calcium, vitamin D, and weightbearing exercise   3. Hyperthyroidism and rheumatoid arthritis -She is on weekly methotrexate, and her rheumatoid arthritis has been well-controlled.  -Continue follow-up with rheumatology and PCP   PLAN: -mammogram due 03/2022 -f/u open   No problem-specific Assessment & Plan notes found for this encounter.   SUMMARY OF ONCOLOGIC  HISTORY: Oncology History Overview Note  Breast cancer of upper-outer quadrant of right female breast [facility])   Staging form: Breast, AJCC 7th Edition     Clinical stage from 03/26/2015: Stage 0 (Tis (DCIS), N0, M0) - Unsigned     Breast cancer of upper-outer quadrant of right female breast ([facility])  03/13/2015 Mammogram   7 mm oval mass in the right breast is suspicious for malignancy.   03/20/2015 Initial Biopsy   Breast, right, needle core biopsy DUCTAL CARCINOMA IN SITU IN PAPILLOMA, LOW GRADE   03/20/2015 Receptors her2   Estrogen Receptor: 95%, POSITIVE, STRONG STAINING INTENSITY Progesterone Receptor: 95%, POSITIVE, STRONG STAINING INTENSITY   03/20/2015 Clinical Stage   Stage 0: Tis N0   04/09/2015 Surgery   Right breast lumpectomy   04/09/2015 Pathology Results   Right breast lumpectomy showed low-grade DCIS, margins were negative. Fibrocystic changes with adenosis.   04/09/2015 Pathologic Stage   Stage 0: Tis N0   04/11/2015 Procedure   Breast/Ovarian panel (GeneDX) revealed variant of uncertain significance called "c.704C>A (p.Thr235Lys; otherwise negative at ATM, BARD1, BRCA1, BRCA2, BRIP1, CDH1, CHEK2, FANCC, MLH1, MSH2, MSH6, NBN, PALB2, PMS2, PTEN, RAD51C, RAD51D, TP53, and XRCC2.     06/20/2015 - 05/2020 Anti-estrogen oral therapy   anastrozole 1 mg once daily. Complete 5 year end of 05/2020   08/21/2015 Survivorship   Survivorship care plan completed and copy given to patient   03/23/2017 Mammogram   IMPRESSION: No evidence of malignancy      INTERVAL HISTORY:  [facility] is here for a follow up of DCIS. She was last seen by NP Lacie one year ago. She presents to the clinic alone. She reports she is doing well overall, no new concerns.  All other systems were reviewed with the patient and are negative.  MEDICAL HISTORY:  Past Medical History:  Diagnosis Date   Arthritis    generalized   Breast cancer ([facility])    Breast cancer of upper-outer  quadrant of right female breast ([facility]) 03/20/2015   Cough    GERD (gastroesophageal reflux disease)    uses PRN meds/with certain foods   Hypothyroidism    Nasal congestion    PONV (postoperative nausea and vomiting)    Rheumatoid arthritis(714.0)    Seasonal allergies    Seasonal allergies    Thyroid disease    hypothyroidism    SURGICAL HISTORY: Past Surgical History:  Procedure Laterality Date   BREAST LUMPECTOMY WITH RADIOACTIVE SEED LOCALIZATION Right 04/09/2015   Procedure: BREAST LUMPECTOMY WITH RADIOACTIVE SEED LOCALIZATION;  Surgeon: Rolm Bookbinder, MD;  Location: [facility];  Service: General;  Laterality: Right;   BREAST REDUCTION SURGERY  2001   CALCANEAL OSTEOTOMY Left 10/11/2013   Procedure: LEFT CALCANEAL OSTEOTOMY;  Surgeon: Evelyn Simmer, MD;  Location: [place];  Service: Orthopedics;  Laterality: Left;   CARPAL TUNNEL RELEASE     left   CERVICAL FUSION  2009   COLONOSCOPY     2002,2005,2008 w/Brodie   COLONOSCOPY  2015   Brodie-MAC-prep good-TA- recall 5 yrs   GASTROCNEMIUS RECESSION Left 10/11/2013   Procedure: LEFT GASTROC RECESSION; LEFT POSTERIOR TIBIAL TENOLYSIS;  Surgeon: Evelyn Simmer, MD;  Location: [place];  Service: Orthopedics;  Laterality: Left;   KNEE ARTHROSCOPY  2004   right   POLYPECTOMY     REMOVAL OF IMPLANT Left 02/14/2014   Procedure: LEFT FOOT REMOVAL OF DEEP IMPLANT;  Surgeon: Evelyn Simmer, MD;  Location: [facility];  Service: Orthopedics;  Laterality: Left;   REVERSE SHOULDER ARTHROPLASTY Right 03/05/2021   Procedure: REVERSE SHOULDER ARTHROPLASTY;  Surgeon: Tania Ade, MD;  Location: [facility];  Service: Orthopedics;  Laterality: Right;   SHOULDER ARTHROSCOPY W/ ROTATOR CUFF REPAIR  2006   right   THYROIDECTOMY     age 64   TONSILLECTOMY     TOTAL KNEE ARTHROPLASTY Right 06/21/2016   Procedure: RIGHT TOTAL KNEE ARTHROPLASTY;  Surgeon: Paralee Cancel, MD;  Location: [facility];  Service: Orthopedics;  Laterality: Right;   TUBAL LIGATION     WRIST ARTHRODESIS  2003   left     I have reviewed the social history and family history with the patient and they are unchanged from previous note.  ALLERGIES:  has No Known Allergies.  MEDICATIONS:  Current Outpatient Medications  Medication Sig Dispense Refill   albuterol (VENTOLIN HFA) 108 (90 Base) MCG/ACT inhaler Inhale 2 puffs into the lungs every 6 (six) hours as needed for wheezing or shortness of breath. 18 g 1   benzonatate (TESSALON) 100 MG capsule Take 1 capsule (100 mg total) by mouth 2 (two) times daily as needed for cough. 30 capsule 1   Cetirizine HCl 10 MG CAPS cetirizine 10 mg capsule  Take by oral route.     folic acid (FOLVITE) 1 MG tablet 1 tablet     HYDROcodone-acetaminophen (NORCO/VICODIN) 5-325 MG tablet Take one to two tablets every six to eight hours as needed for pain. (Patient not taking: Reported on 12/28/2021) 20 tablet 0   levothyroxine (SYNTHROID) 100 MCG tablet Take 100 mcg by mouth See admin instructions. On Saturdays and Sundays     levothyroxine (SYNTHROID) 88 MCG tablet Take 1 tablet (88 mcg total) by  mouth See admin instructions. Monday-Friday 90 tablet 2   methotrexate (RHEUMATREX) 2.5 MG tablet Take 20 mg by mouth once a week. 8 tablets weekly  1   mometasone (NASONEX) 50 MCG/ACT nasal spray Place 2 sprays into the nose daily. 17 g 2   montelukast (SINGULAIR) 10 MG tablet TAKE 1 TABLET BY MOUTH EVERYDAY AT BEDTIME 90 tablet 1   nystatin (MYCOSTATIN/NYSTOP) powder APPLY TO AFFECTED AREA TWICE A DAY     potassium chloride (KLOR-CON) 20 MEQ packet Klor-Con 20 mEq oral packet  Take 1 packet 4 times a day by oral route.     sulfaSALAzine (AZULFIDINE) 500 MG tablet Take 1,000 mg by mouth 2 (two) times daily.     triamcinolone (KENALOG) 0.1 % Apply 1 application topically daily as needed (itching). (Patient not taking: Reported on 12/28/2021)     No current facility-administered  medications for this visit.    PHYSICAL EXAMINATION: ECOG PERFORMANCE STATUS: 0 - Asymptomatic  Vitals:   02/08/22 0842  BP: 133/88  Pulse: 73  Resp: 18  Temp: 98.7 F (37.1 C)  SpO2: 99%   Wt Readings from Last 3 Encounters:  02/08/22 157 lb 14.4 oz (71.6 kg)  12/28/21 156 lb 12.8 oz (71.1 kg)  07/14/21 153 lb 6.4 oz (69.6 kg)     GENERAL:alert, no distress and comfortable SKIN: skin color, texture, turgor are normal, no rashes or significant lesions EYES: normal, Conjunctiva are pink and non-injected, sclera clear  NECK: supple, thyroid normal size, non-tender, without nodularity LYMPH:  no palpable lymphadenopathy in the cervical, axillary LUNGS: clear to auscultation and percussion with normal breathing effort HEART: regular rate & rhythm and no murmurs and no lower extremity edema ABDOMEN:abdomen soft, non-tender and normal bowel sounds Musculoskeletal:no cyanosis of digits and no clubbing  NEURO: alert & oriented x 3 with fluent speech, no focal motor/sensory deficits BREAST: No palpable mass, nodules or adenopathy bilaterally. Breast exam benign.   LABORATORY DATA:  I have reviewed the data as listed    Latest Ref Rng & Units 02/08/2022    8:23 AM 12/28/2021   12:58 PM 06/25/2021    9:18 AM  CBC  WBC 4.0 - 10.5 K/uL 4.8  6.6  5.0   Hemoglobin 12.0 - 15.0 g/dL 13.0  11.8  12.5   Hematocrit 36.0 - 46.0 % 40.5  36.8  37.5   Platelets 150 - 400 K/uL 201  365  270         Latest Ref Rng & Units 02/08/2022    8:23 AM 12/28/2021   12:58 PM 06/30/2021   10:19 AM  CMP  Glucose 70 - 99 mg/dL 98  97  92   BUN 8 - 23 mg/dL _0 Creatinine 0.44 - 1.00 mg/dL 0.81  0.67  0.84   Sodium 135 - 145 mmol/L 138  137  140   Potassium 3.5 - 5.1 mmol/L 3.2  4.0  3.9   Chloride 98 - 111 mmol/L 105  100  102   CO2 22 - 32 mmol/L _1 Calcium 8.9 - 10.3 mg/dL 9.4  9.4  9.1   Total Protein 6.5 - 8.1 g/dL 7.8  8.1  7.7   Total Bilirubin 0.3 - 1.2 mg/dL 0.5  0.3  0.3    Alkaline Phos 38 - 126 U/L 82  111  114   AST 15 - 41 U/L _2 ALT 0 -  44 U/L _0 RADIOGRAPHIC STUDIES: I have personally reviewed the radiological images as listed and agreed with the findings in the report. No results found.    No orders of the defined types were placed in this encounter.  All questions were answered. The patient knows to call the clinic with any problems, questions or concerns. No barriers to learning was detected. The total time spent in the appointment was 25 minutes.     Truitt Merle, MD 02/08/2022   I, Wilburn Mylar, am acting as scribe for Truitt Merle, MD.   I have reviewed the above documentation for accuracy and completeness, and I agree with the above.

## 2022-02-10 DIAGNOSIS — M0589 Other rheumatoid arthritis with rheumatoid factor of multiple sites: Secondary | ICD-10-CM | POA: Diagnosis not present

## 2022-02-15 DIAGNOSIS — H524 Presbyopia: Secondary | ICD-10-CM | POA: Diagnosis not present

## 2022-02-15 DIAGNOSIS — E0500 Thyrotoxicosis with diffuse goiter without thyrotoxic crisis or storm: Secondary | ICD-10-CM | POA: Diagnosis not present

## 2022-02-15 DIAGNOSIS — H04123 Dry eye syndrome of bilateral lacrimal glands: Secondary | ICD-10-CM | POA: Diagnosis not present

## 2022-02-15 DIAGNOSIS — H40023 Open angle with borderline findings, high risk, bilateral: Secondary | ICD-10-CM | POA: Diagnosis not present

## 2022-02-15 DIAGNOSIS — H35033 Hypertensive retinopathy, bilateral: Secondary | ICD-10-CM | POA: Diagnosis not present

## 2022-02-15 LAB — HM DIABETES EYE EXAM

## 2022-02-16 ENCOUNTER — Encounter: Payer: Self-pay | Admitting: Podiatry

## 2022-02-16 ENCOUNTER — Ambulatory Visit (INDEPENDENT_AMBULATORY_CARE_PROVIDER_SITE_OTHER): Payer: Medicare PPO | Admitting: Podiatry

## 2022-02-16 DIAGNOSIS — M79672 Pain in left foot: Secondary | ICD-10-CM | POA: Diagnosis not present

## 2022-02-16 DIAGNOSIS — L84 Corns and callosities: Secondary | ICD-10-CM | POA: Diagnosis not present

## 2022-02-16 DIAGNOSIS — Q828 Other specified congenital malformations of skin: Secondary | ICD-10-CM | POA: Diagnosis not present

## 2022-02-16 DIAGNOSIS — M79671 Pain in right foot: Secondary | ICD-10-CM

## 2022-02-19 ENCOUNTER — Telehealth: Payer: Self-pay | Admitting: *Deleted

## 2022-02-19 NOTE — Telephone Encounter (Addendum)
Contacted patient to inform of potassium level with last labs. LVM with Dr. Ernestina Penna message below. Also informed her that a list of potassium rich foods will be sent to her via [place]. Encouraged her to contact the office for questions.    ----- Message from Evalee Jefferson, RN sent at 02/19/2022  8:27 AM EDT ----- Can you call this patient for me please.  Thanks!  ----- Message ----- From: Truitt Merle, MD Sent: 02/09/2022   9:21 AM EDT To: Estella Husk, LPN; Evalee Jefferson, RN  Please let her know her K was slightly low yesterday, please take OTC potasium pill for a week, or increase K rich food, thanks   Truitt Merle

## 2022-02-21 NOTE — Progress Notes (Signed)
  Subjective:  Patient ID: Ev[facility]r, female    DOB: 24-Dec-1945,  MRN: [id number]  [name] presents to clinic today for callus(es) b/l lower extremities. Aggravating factors include weightbearing with and without shoe gear. Pain is relieved with periodic professional debridement.  New problem(s): None.   PCP is Evelyn Chard, MD , and last visit was  July 14, 2021.  No Known Allergies  Review of Systems: Negative except as noted in the HPI.  Objective: No changes noted in today's physical examination. Evelyn Orr is a pleasant 76 y.o. female in NAD. AAO x 3. Vascular CFT immediate b/l LE. Palpable DP/PT pulses b/l LE. Digital hair present b/l. Skin temperature gradient WNL b/l. No pain with calf compression b/l. No edema b/l.  No cyanosis or clubbing noted b/l LE.  Neurologic Normal speech. Oriented to person, place, and time. Protective sensation intact 5/5 intact bilaterally with 10g monofilament b/l. Vibratory sensation intact b/l.  Dermatologic Longitudinal surgical scars 1st and 2nd webspaces. Pedal integument with normal turgor, texture and tone BLE. No open wounds b/l LE. Toenails 1-5 bilaterally well maintained with adequate length. No erythema, no edema, no drainage, no fluctuance.   Hyperkeratotic lesion(s) submet head 5 b/l feet.  No erythema, no edema, no drainage, no fluctuance. Porokeratotic lesion plantar left heel.  Orthopedic: Normal muscle strength 5/5 to all lower extremity muscle groups bilaterally. No pain, crepitus or joint limitation noted with ROM b/l LE. No gross bony pedal deformities b/l. Patient ambulates independently without assistive aids.   Radiographs: None  Assessment/Plan: 1. Callus   2. Porokeratosis   3. Pain in both feet   -Consent given for treatment as described below: -Examined patient. -No new findings. No new orders. -Medicare ABN signed for services of paring of corn(s)/callus(es)/porokeratos(es). Copy in  patient chart. -Patient to continue soft, supportive shoe gear daily. -Callus(es) submet head 5 b/l pared utilizing sterile scalpel blade without complication or incident. Total number debrided =2. -Porokeratotic lesion(s) plantar heel pad of left foot pared and enucleated with sterile currette without incident. Total number of lesions debrided=1. -Patient/POA to call should there be question/concern in the interim.   Return in about 3 months (around 05/18/2022).  [facility]r, DPM

## 2022-02-23 DIAGNOSIS — Z1152 Encounter for screening for COVID-19: Secondary | ICD-10-CM | POA: Diagnosis not present

## 2022-02-24 DIAGNOSIS — M0589 Other rheumatoid arthritis with rheumatoid factor of multiple sites: Secondary | ICD-10-CM | POA: Diagnosis not present

## 2022-02-24 DIAGNOSIS — M8580 Other specified disorders of bone density and structure, unspecified site: Secondary | ICD-10-CM | POA: Diagnosis not present

## 2022-02-24 DIAGNOSIS — M79643 Pain in unspecified hand: Secondary | ICD-10-CM | POA: Diagnosis not present

## 2022-02-24 DIAGNOSIS — Z79899 Other long term (current) drug therapy: Secondary | ICD-10-CM | POA: Diagnosis not present

## 2022-02-24 DIAGNOSIS — M1990 Unspecified osteoarthritis, unspecified site: Secondary | ICD-10-CM | POA: Diagnosis not present

## 2022-03-01 DIAGNOSIS — Z1152 Encounter for screening for COVID-19: Secondary | ICD-10-CM | POA: Diagnosis not present

## 2022-03-18 DIAGNOSIS — Z1152 Encounter for screening for COVID-19: Secondary | ICD-10-CM | POA: Diagnosis not present

## 2022-03-23 DIAGNOSIS — B356 Tinea cruris: Secondary | ICD-10-CM | POA: Diagnosis not present

## 2022-03-23 DIAGNOSIS — N898 Other specified noninflammatory disorders of vagina: Secondary | ICD-10-CM | POA: Diagnosis not present

## 2022-04-07 DIAGNOSIS — Z1152 Encounter for screening for COVID-19: Secondary | ICD-10-CM | POA: Diagnosis not present

## 2022-04-21 DIAGNOSIS — R921 Mammographic calcification found on diagnostic imaging of breast: Secondary | ICD-10-CM | POA: Diagnosis not present

## 2022-04-27 LAB — HM MAMMOGRAPHY

## 2022-05-05 DIAGNOSIS — Z1152 Encounter for screening for COVID-19: Secondary | ICD-10-CM | POA: Diagnosis not present

## 2022-05-10 ENCOUNTER — Encounter: Payer: Self-pay | Admitting: Internal Medicine

## 2022-05-10 ENCOUNTER — Ambulatory Visit (INDEPENDENT_AMBULATORY_CARE_PROVIDER_SITE_OTHER): Payer: Medicare PPO | Admitting: Internal Medicine

## 2022-05-10 VITALS — BP 122/80 | HR 63 | Ht 61.0 in | Wt 154.2 lb

## 2022-05-10 DIAGNOSIS — Z2821 Immunization not carried out because of patient refusal: Secondary | ICD-10-CM

## 2022-05-10 DIAGNOSIS — Z6829 Body mass index (BMI) 29.0-29.9, adult: Secondary | ICD-10-CM

## 2022-05-10 DIAGNOSIS — Z0000 Encounter for general adult medical examination without abnormal findings: Secondary | ICD-10-CM

## 2022-05-10 DIAGNOSIS — M069 Rheumatoid arthritis, unspecified: Secondary | ICD-10-CM

## 2022-05-10 DIAGNOSIS — E039 Hypothyroidism, unspecified: Secondary | ICD-10-CM | POA: Diagnosis not present

## 2022-05-10 DIAGNOSIS — E559 Vitamin D deficiency, unspecified: Secondary | ICD-10-CM

## 2022-05-10 NOTE — Patient Instructions (Signed)

## 2022-05-10 NOTE — Progress Notes (Signed)
Rich Brave [place],acting as a Education administrator for Maximino Greenland, MD.,have documented all relevant documentation on the behalf of Maximino Greenland, MD,as directed by  Maximino Greenland, MD while in the presence of Maximino Greenland, MD.   Subjective:     Patient ID: [facility] , female    DOB: October 30, 1945 , 76 y.o.   MRN: [id number]   Chief Complaint  Patient presents with   Annual Exam   Hypothyroidism    HPI  She is here today for a full physical examination.  She is followed by Earnstine Regal, PA for her GYN exam.  She was last seen in October 2023. She reports compliance with meds. Denies headaches, chest pain and shortness of breath.   Thyroid Problem Presents for follow-up visit. Patient reports no hoarse voice, leg swelling or visual change. The symptoms have been stable.     Past Medical History:  Diagnosis Date   Arthritis    generalized   Breast cancer ([place])    Breast cancer of upper-outer quadrant of right female breast ([place]) 03/20/2015   Cough    GERD (gastroesophageal reflux disease)    uses PRN meds/with certain foods   Hypothyroidism    Nasal congestion    PONV (postoperative nausea and vomiting)    Rheumatoid arthritis(714.0)    Seasonal allergies    Seasonal allergies    Thyroid disease    hypothyroidism     Family History  Problem Relation Age of Onset   Colon polyps Mother    Hypertension Mother    Heart disease Father    Heart attack Father    Other Sister        high breast density   Colon polyps Brother    Prostate cancer Brother 63       unknown Gleason   Colon polyps Brother    Prostate cancer Maternal Uncle    Prostate cancer Maternal Uncle    Prostate cancer Maternal Uncle    Breast cancer Paternal Aunt        dx. 68 or younger   Colon cancer Paternal Uncle    Prostate cancer Maternal Grandfather        dx. older age   Colon polyps Son 79       colonoscopy for unspecified reason; found some colon polyps, has not had one since    Breast cancer Cousin        4 maternal cousin had breast cancer    Colon cancer Other    Cancer Other        MGM's sister   Rectal cancer Neg Hx    Stomach cancer Neg Hx    Esophageal cancer Neg Hx      Current Outpatient Medications:    albuterol (VENTOLIN HFA) 108 (90 Base) MCG/ACT inhaler, Inhale 2 puffs into the lungs every 6 (six) hours as needed for wheezing or shortness of breath., Disp: 18 g, Rfl: 1   folic acid (FOLVITE) 1 MG tablet, 1 tablet, Disp: , Rfl:    levothyroxine (SYNTHROID) 100 MCG tablet, Take 100 mcg by mouth See admin instructions. On Saturdays and Sundays, Disp: , Rfl:    levothyroxine (SYNTHROID) 88 MCG tablet, Take 1 tablet (88 mcg total) by mouth See admin instructions. Monday-Friday, Disp: 90 tablet, Rfl: 2   mometasone (NASONEX) 50 MCG/ACT nasal spray, Place 2 sprays into the nose daily., Disp: 17 g, Rfl: 2   montelukast (SINGULAIR) 10 MG tablet, Take 1 tablet every day by  oral route., Disp: , Rfl:    nystatin cream (MYCOSTATIN), APPLY TO THE AFFECTED AREA(S) BY TOPICAL ROUTE 2 TIMES PER DAY x 14 days then prn, Disp: , Rfl:    sulfaSALAzine (AZULFIDINE) 500 MG tablet, Take 1,000 mg by mouth 2 (two) times daily., Disp: , Rfl:    triamcinolone (KENALOG) 0.1 %, Apply 1 application  topically daily as needed (itching)., Disp: , Rfl:    methotrexate (RHEUMATREX) 2.5 MG tablet, Take 20 mg by mouth once a week. 8 tablets weekly (Patient not taking: Reported on 05/10/2022), Disp: , Rfl: 1   No Known Allergies    The patient states she uses post menopausal status for birth control. Last LMP was No LMP recorded. Patient is postmenopausal.. Negative for Dysmenorrhea. Negative for: breast discharge, breast lump(s), breast pain and breast self exam. Associated symptoms include abnormal vaginal bleeding. Pertinent negatives include abnormal bleeding (hematology), anxiety, decreased libido, depression, difficulty falling sleep, dyspareunia, history of infertility, nocturia,  sexual dysfunction, sleep disturbances, urinary incontinence, urinary urgency, vaginal discharge and vaginal itching. Diet regular.The patient states her exercise level is    . The patient's tobacco use is:  Social History   Tobacco Use  Smoking Status Former   Packs/day: 0.25   Years: 21.00   Total pack years: 5.25   Types: Cigarettes   Quit date: 02/13/1984   Years since quitting: 38.3  Smokeless Tobacco Never  . She has been exposed to passive smoke. The patient's alcohol use is:  Social History   Substance and Sexual Activity  Alcohol Use No   Review of Systems  Constitutional: Negative.   HENT: Negative.  Negative for hoarse voice.   Eyes: Negative.   Respiratory: Negative.    Cardiovascular: Negative.   Gastrointestinal: Negative.   Endocrine: Negative.   Genitourinary: Negative.   Musculoskeletal: Negative.   Skin: Negative.   Allergic/Immunologic: Negative.   Neurological: Negative.   Hematological: Negative.   Psychiatric/Behavioral: Negative.       Today's Vitals   05/10/22 0953  BP: 122/80  Pulse: 63  Weight: 154 lb 3.2 oz (69.9 kg)  Height: _0  (1.549 m)  PainSc: 0-No pain   Body mass index is 29.14 kg/m.  Wt Readings from Last 3 Encounters:  05/10/22 154 lb 3.2 oz (69.9 kg)  02/08/22 157 lb 14.4 oz (71.6 kg)  12/28/21 156 lb 12.8 oz (71.1 kg)    Objective:  Physical Exam Vitals and nursing note reviewed.  Constitutional:      Appearance: Normal appearance.  HENT:     Head: Normocephalic and atraumatic.     Right Ear: Tympanic membrane, ear canal and external ear normal.     Left Ear: Tympanic membrane, ear canal and external ear normal.     Nose:     Comments: Masked     Mouth/Throat:     Comments: Masked  Eyes:     Extraocular Movements: Extraocular movements intact.     Conjunctiva/sclera: Conjunctivae normal.     Pupils: Pupils are equal, round, and reactive to light.  Cardiovascular:     Rate and Rhythm: Normal rate and regular  rhythm.     Pulses: Normal pulses.     Heart sounds: Normal heart sounds.  Pulmonary:     Effort: Pulmonary effort is normal.     Breath sounds: Normal breath sounds.  Chest:  Breasts:    Tanner Score is 5.     Right: Normal.     Left: Normal.  Comments: B/l healed surgical scars Abdominal:     General: Bowel sounds are normal.     Palpations: Abdomen is soft.  Genitourinary:    Comments: deferred Musculoskeletal:        General: Normal range of motion.     Cervical back: Normal range of motion and neck supple.  Skin:    General: Skin is warm and dry.  Neurological:     General: No focal deficit present.     Mental Status: She is alert and oriented to person, place, and time.  Psychiatric:        Mood and Affect: Mood normal.        Behavior: Behavior normal.      Assessment And Plan:     1. Encounter for general adult medical examination w/o abnormal findings Comments: A full exam was performed. Importance of monthly self breast exams was discussed with the patient.  PATIENT IS ADVISED TO GET 30-45 MINUTES REGULAR EXERCISE NO LESS THAN FOUR TO FIVE DAYS PER WEEK - BOTH WEIGHTBEARING EXERCISES AND AEROBIC ARE RECOMMENDED.  PATIENT IS ADVISED TO FOLLOW A HEALTHY DIET WITH AT LEAST SIX FRUITS/VEGGIES PER DAY, DECREASE INTAKE OF RED MEAT, AND TO INCREASE FISH INTAKE TO TWO DAYS PER WEEK.  MEATS/FISH SHOULD NOT BE FRIED, BAKED OR BROILED IS PREFERABLE.  IT IS ALSO IMPORTANT TO CUT BACK ON YOUR SUGAR INTAKE. PLEASE AVOID ANYTHING WITH ADDED SUGAR, CORN SYRUP OR OTHER SWEETENERS. IF YOU MUST USE A SWEETENER, YOU CAN TRY STEVIA. IT IS ALSO IMPORTANT TO AVOID ARTIFICIALLY SWEETENERS AND DIET BEVERAGES. LASTLY, I SUGGEST WEARING SPF 50 SUNSCREEN ON EXPOSED PARTS AND ESPECIALLY WHEN IN THE DIRECT SUNLIGHT FOR AN EXTENDED PERIOD OF TIME.  PLEASE AVOID FAST FOOD RESTAURANTS AND INCREASE YOUR WATER INTAKE.  2. Primary hypothyroidism Comments: I will check thyroid panel and adjust meds  as needed.  She agrees to rto in six months for re-evaluation. - Lipid panel - TSH - T4, Free  3. Vitamin D deficiency disease Comments: I will check vitamin D level and supplement as needed. - BMP8+eGFR - Vitamin D (25 hydroxy)  4. Rheumatoid arthritis involving both feet, unspecified whether rheumatoid factor present ([place]) Comments: Chronic, podiatry input is appreciated.  She is encouraged to follow an anti-inflammatory diet.   5. BMI 29.0-29.9,adult Comments: She was commended on her regular exercise regimen. She walks/strength training for an hour four days per week.  6. Herpes zoster vaccination declined  7. Influenza vaccination declined  8. Pneumococcal vaccination declined   Patient was given opportunity to ask questions. Patient verbalized understanding of the plan and was able to repeat key elements of the plan. All questions were answered to their satisfaction.   I, Maximino Greenland, MD, have reviewed all documentation for this visit. The documentation on 05/10/22 for the exam, diagnosis, procedures, and orders are all accurate and complete.   THE PATIENT IS ENCOURAGED TO PRACTICE SOCIAL DISTANCING DUE TO THE COVID-19 PANDEMIC.

## 2022-05-11 LAB — BMP8+EGFR
BUN/Creatinine Ratio: 10 — ABNORMAL LOW (ref 12–28)
BUN: 8 mg/dL (ref 8–27)
CO2: 22 mmol/L (ref 20–29)
Calcium: 9.8 mg/dL (ref 8.7–10.3)
Chloride: 103 mmol/L (ref 96–106)
Creatinine, Ser: 0.77 mg/dL (ref 0.57–1.00)
Glucose: 78 mg/dL (ref 70–99)
Potassium: 3.5 mmol/L (ref 3.5–5.2)
Sodium: 142 mmol/L (ref 134–144)
eGFR: 81 mL/min/{1.73_m2} (ref >59)

## 2022-05-11 LAB — LIPID PANEL
Chol/HDL Ratio: 3.3 ratio (ref 0.0–4.4)
Cholesterol, Total: 242 mg/dL — ABNORMAL HIGH (ref 100–199)
HDL: 74 mg/dL (ref >39)
LDL Chol Calc (NIH): 148 mg/dL — ABNORMAL HIGH (ref 0–99)
Triglycerides: 116 mg/dL (ref 0–149)
VLDL Cholesterol Cal: 20 mg/dL (ref 5–40)

## 2022-05-11 LAB — TSH: TSH: 3.69 u[IU]/mL (ref 0.450–4.500)

## 2022-05-11 LAB — T4, FREE: Free T4: 1.38 ng/dL (ref 0.82–1.77)

## 2022-05-11 LAB — VITAMIN D 25 HYDROXY (VIT D DEFICIENCY, FRACTURES): Vit D, 25-Hydroxy: 20 ng/mL — ABNORMAL LOW (ref 30.0–100.0)

## 2022-05-12 ENCOUNTER — Encounter: Payer: 59 | Admitting: Internal Medicine

## 2022-05-12 ENCOUNTER — Encounter: Payer: Self-pay | Admitting: Internal Medicine

## 2022-05-18 DIAGNOSIS — Z1152 Encounter for screening for COVID-19: Secondary | ICD-10-CM | POA: Diagnosis not present

## 2022-06-01 DIAGNOSIS — Z6828 Body mass index (BMI) 28.0-28.9, adult: Secondary | ICD-10-CM | POA: Diagnosis not present

## 2022-06-01 DIAGNOSIS — B356 Tinea cruris: Secondary | ICD-10-CM | POA: Diagnosis not present

## 2022-06-01 DIAGNOSIS — Z1239 Encounter for other screening for malignant neoplasm of breast: Secondary | ICD-10-CM | POA: Diagnosis not present

## 2022-06-01 DIAGNOSIS — M8580 Other specified disorders of bone density and structure, unspecified site: Secondary | ICD-10-CM | POA: Diagnosis not present

## 2022-06-01 DIAGNOSIS — Z1211 Encounter for screening for malignant neoplasm of colon: Secondary | ICD-10-CM | POA: Diagnosis not present

## 2022-06-02 ENCOUNTER — Ambulatory Visit (INDEPENDENT_AMBULATORY_CARE_PROVIDER_SITE_OTHER): Payer: Medicare PPO | Admitting: Podiatry

## 2022-06-02 ENCOUNTER — Encounter: Payer: Self-pay | Admitting: Podiatry

## 2022-06-02 VITALS — BP 133/70

## 2022-06-02 DIAGNOSIS — Q828 Other specified congenital malformations of skin: Secondary | ICD-10-CM

## 2022-06-02 DIAGNOSIS — M79672 Pain in left foot: Secondary | ICD-10-CM

## 2022-06-02 DIAGNOSIS — M79671 Pain in right foot: Secondary | ICD-10-CM | POA: Diagnosis not present

## 2022-06-02 DIAGNOSIS — L84 Corns and callosities: Secondary | ICD-10-CM | POA: Diagnosis not present

## 2022-06-02 NOTE — Progress Notes (Unsigned)
  Subjective:  Patient ID: Evelyn Orr, female    DOB: 1946-02-12,  MRN: [id number]  [place] presents to clinic today for painful porokeratotic lesions b/l lower extremities. Pain prevent(s) comfortable ambulation. Aggravating factor is weightbearing with and without shoegear.  Chief Complaint  Patient presents with   Callouses    BIL callus debridement PCP-Robyn Sanders PCP VST-04/2022    New problem(s): None.   PCP is [name], MD.  No Known Allergies  Review of Systems: Negative except as noted in the HPI.  Objective: No changes noted in today's physical examination. Vitals:   06/02/22 0847  BP: 133/70   Evelyn Orr is a pleasant 77 y.o. female WD, WN in NAD. AAO x 3.  Vascular CFT immediate b/l LE. Palpable DP/PT pulses b/l LE. Digital hair present b/l. Skin temperature gradient WNL b/l. No pain with calf compression b/l. No edema b/l.  No cyanosis or clubbing noted b/l LE.  Neurologic Normal speech. Oriented to person, place, and time. Protective sensation intact 5/5 intact bilaterally with 10g monofilament b/l. Vibratory sensation intact b/l.  Dermatologic Longitudinal surgical scars 1st and 2nd webspaces. Pedal integument with normal turgor, texture and tone BLE. No open wounds b/l LE. Toenails 1-5 bilaterally well maintained with adequate length. No erythema, no edema, no drainage, no fluctuance.   Hyperkeratotic lesion(s) submet head 5 b/l feet.  No erythema, no edema, no drainage, no fluctuance.   Porokeratotic lesion plantar left heel.  Orthopedic: Normal muscle strength 5/5 to all lower extremity muscle groups bilaterally. No pain, crepitus or joint limitation noted with ROM b/l LE. No gross bony pedal deformities b/l. Patient ambulates independently without assistive aids.   Radiographs: None Assessment/Plan: 1. Callus   2. Porokeratosis   3. Pain in both feet     No orders of the defined types were placed in this encounter.    None {Jgplan:23602::"-Patient/POA to call should there be question/concern in the interim."}   Return in about 3 months (around 09/01/2022).  Marzetta Board, DPM

## 2022-06-03 ENCOUNTER — Ambulatory Visit (INDEPENDENT_AMBULATORY_CARE_PROVIDER_SITE_OTHER): Payer: Medicare PPO

## 2022-06-03 VITALS — Ht 61.0 in | Wt 154.0 lb

## 2022-06-03 DIAGNOSIS — Z0000 Encounter for general adult medical examination without abnormal findings: Secondary | ICD-10-CM | POA: Diagnosis not present

## 2022-06-03 NOTE — Patient Instructions (Signed)
Evelyn Orr , Thank you for taking time to come for your Medicare Wellness Visit. I appreciate your ongoing commitment to your health goals. Please review the following plan we discussed and let me know if I can assist you in the future.   These are the goals we discussed:  Goals      DIET - INCREASE WATER INTAKE     04/10/2019, wants to increase water intake by drinking through out the day     Patient Stated     04/23/2020, stay healthy     Patient Stated     05/14/2021, stay healthy     Patient Stated     06/03/2022, no goals        This is a list of the screening recommended for you and due dates:  Health Maintenance  Topic Date Due   DTaP/Tdap/Td vaccine (1 - Tdap) Never done   COVID-19 Vaccine (5 - 2023-24 season) 01/29/2022   Zoster (Shingles) Vaccine (1 of 2) 08/09/2022*   Flu Shot  08/29/2022*   Pneumonia Vaccine (1 - PCV) 05/11/2023*   Mammogram  04/28/2023   Medicare Annual Wellness Visit  06/04/2023   Colon Cancer Screening  04/16/2024   DEXA scan (bone density measurement)  Completed   Hepatitis C Screening: USPSTF Recommendation to screen - Ages 74-79 yo.  Completed   HPV Vaccine  Aged Out  *Topic was postponed. The date shown is not the original due date.    Advanced directives: Advance directive discussed with you today.   Conditions/risks identified: none  Next appointment: Follow up in one year for your annual wellness visit    Preventive Care 65 Years and Older, Female Preventive care refers to lifestyle choices and visits with your health care provider that can promote health and wellness. What does preventive care include? A yearly physical exam. This is also called an annual well check. Dental exams once or twice a year. Routine eye exams. Ask your health care provider how often you should have your eyes checked. Personal lifestyle choices, including: Daily care of your teeth and gums. Regular physical activity. Eating a healthy  diet. Avoiding tobacco and drug use. Limiting alcohol use. Practicing safe sex. Taking low-dose aspirin every day. Taking vitamin and mineral supplements as recommended by your health care provider. What happens during an annual well check? The services and screenings done by your health care provider during your annual well check will depend on your age, overall health, lifestyle risk factors, and family history of disease. Counseling  Your health care provider may ask you questions about your: Alcohol use. Tobacco use. Drug use. Emotional well-being. Home and relationship well-being. Sexual activity. Eating habits. History of falls. Memory and ability to understand (cognition). Work and work Statistician. Reproductive health. Screening  You may have the following tests or measurements: Height, weight, and BMI. Blood pressure. Lipid and cholesterol levels. These may be checked every 5 years, or more frequently if you are over 39 years old. Skin check. Lung cancer screening. You may have this screening every year starting at age 77 if you have a 30-pack-year history of smoking and currently smoke or have quit within the past 15 years. Fecal occult blood test (FOBT) of the stool. You may have this test every year starting at age 49. Flexible sigmoidoscopy or colonoscopy. You may have a sigmoidoscopy every 5 years or a colonoscopy every 10 years starting at age 43. Hepatitis C blood test. Hepatitis B blood test. Sexually transmitted disease (STD)  testing. Diabetes screening. This is done by checking your blood sugar (glucose) after you have not eaten for a while (fasting). You may have this done every 1-3 years. Bone density scan. This is done to screen for osteoporosis. You may have this done starting at age 13. Mammogram. This may be done every 1-2 years. Talk to your health care provider about how often you should have regular mammograms. Talk with your health care provider about  your test results, treatment options, and if necessary, the need for more tests. Vaccines  Your health care provider may recommend certain vaccines, such as: Influenza vaccine. This is recommended every year. Tetanus, diphtheria, and acellular pertussis (Tdap, Td) vaccine. You may need a Td booster every 10 years. Zoster vaccine. You may need this after age 65. Pneumococcal 13-valent conjugate (PCV13) vaccine. One dose is recommended after age 52. Pneumococcal polysaccharide (PPSV23) vaccine. One dose is recommended after age 69. Talk to your health care provider about which screenings and vaccines you need and how often you need them. This information is not intended to replace advice given to you by your health care provider. Make sure you discuss any questions you have with your health care provider. Document Released: 06/13/2015 Document Revised: 02/04/2016 Document Reviewed: 03/18/2015 Elsevier Interactive Patient Education  2017 [place] Prevention in the Home Falls can cause injuries. They can happen to people of all ages. There are many things you can do to make your home safe and to help prevent falls. What can I do on the outside of my home? Regularly fix the edges of walkways and driveways and fix any cracks. Remove anything that might make you trip as you walk through a door, such as a raised step or threshold. Trim any bushes or trees on the path to your home. Use bright outdoor lighting. Clear any walking paths of anything that might make someone trip, such as rocks or tools. Regularly check to see if handrails are loose or broken. Make sure that both sides of any steps have handrails. Any raised decks and porches should have guardrails on the edges. Have any leaves, snow, or ice cleared regularly. Use sand or salt on walking paths during winter. Clean up any spills in your garage right away. This includes oil or grease spills. What can I do in the bathroom? Use  night lights. Install grab bars by the toilet and in the tub and shower. Do not use towel bars as grab bars. Use non-skid mats or decals in the tub or shower. If you need to sit down in the shower, use a plastic, non-slip stool. Keep the floor dry. Clean up any water that spills on the floor as soon as it happens. Remove soap buildup in the tub or shower regularly. Attach bath mats securely with double-sided non-slip rug tape. Do not have throw rugs and other things on the floor that can make you trip. What can I do in the bedroom? Use night lights. Make sure that you have a light by your bed that is easy to reach. Do not use any sheets or blankets that are too big for your bed. They should not hang down onto the floor. Have a firm chair that has side arms. You can use this for support while you get dressed. Do not have throw rugs and other things on the floor that can make you trip. What can I do in the kitchen? Clean up any spills right away. Avoid walking on wet  floors. Keep items that you use a lot in easy-to-reach places. If you need to reach something above you, use a strong step stool that has a grab bar. Keep electrical cords out of the way. Do not use floor polish or wax that makes floors slippery. If you must use wax, use non-skid floor wax. Do not have throw rugs and other things on the floor that can make you trip. What can I do with my stairs? Do not leave any items on the stairs. Make sure that there are handrails on both sides of the stairs and use them. Fix handrails that are broken or loose. Make sure that handrails are as long as the stairways. Check any carpeting to make sure that it is firmly attached to the stairs. Fix any carpet that is loose or worn. Avoid having throw rugs at the top or bottom of the stairs. If you do have throw rugs, attach them to the floor with carpet tape. Make sure that you have a light switch at the top of the stairs and the bottom of the  stairs. If you do not have them, ask someone to add them for you. What else can I do to help prevent falls? Wear shoes that: Do not have high heels. Have rubber bottoms. Are comfortable and fit you well. Are closed at the toe. Do not wear sandals. If you use a stepladder: Make sure that it is fully opened. Do not climb a closed stepladder. Make sure that both sides of the stepladder are locked into place. Ask someone to hold it for you, if possible. Clearly mark and make sure that you can see: Any grab bars or handrails. First and last steps. Where the edge of each step is. Use tools that help you move around (mobility aids) if they are needed. These include: Canes. Walkers. Scooters. Crutches. Turn on the lights when you go into a dark area. Replace any light bulbs as soon as they burn out. Set up your furniture so you have a clear path. Avoid moving your furniture around. If any of your floors are uneven, fix them. If there are any pets around you, be aware of where they are. Review your medicines with your doctor. Some medicines can make you feel dizzy. This can increase your chance of falling. Ask your doctor what other things that you can do to help prevent falls. This information is not intended to replace advice given to you by your health care provider. Make sure you discuss any questions you have with your health care provider. Document Released: 03/13/2009 Document Revised: 10/23/2015 Document Reviewed: 06/21/2014 Elsevier Interactive Patient Education  2017 [employer].

## 2022-06-03 NOTE — Progress Notes (Signed)
I connected with [facility] today by telephone and verified that I am speaking with the correct person using two identifiers. Location patient: home Location provider: work Persons participating in the virtual visit: Evelyn Orr, Glenna Durand LPN.   I discussed the limitations, risks, security and privacy concerns of performing an evaluation and management service by telephone and the availability of in person appointments. I also discussed with the patient that there may be a patient responsible charge related to this service. The patient expressed understanding and verbally consented to this telephonic visit.    Interactive audio and video telecommunications were attempted between this provider and patient, however failed, due to patient having technical difficulties OR patient did not have access to video capability.  We continued and completed visit with audio only.     Vital signs may be patient reported or missing.  Subjective:   Evelyn Orr is a 77 y.o. female who presents for Medicare Annual (Subsequent) preventive examination.  Review of Systems     Cardiac Risk Factors include: advanced age (>5mn, >>40women)     Objective:    Today's Vitals   06/03/22 1356  Weight: 154 lb (69.9 kg)  Height: _0  (1.549 m)   Body mass index is 29.1 kg/m.     06/03/2022    2:00 PM 05/14/2021    9:06 AM 03/05/2021    6:00 PM 03/03/2021    3:37 PM 02/03/2021    9:14 AM 04/23/2020    9:11 AM 12/10/2019    8:30 AM  Advanced Directives  Does Patient Have a Medical Advance Directive? _1  No No  Would patient like information on creating a medical advance directive?  No - Patient declined No - Patient declined   No - Patient declined     Current Medications (verified) Outpatient Encounter Medications as of 06/03/2022  Medication Sig   albuterol (VENTOLIN HFA) 108 (90 Base) MCG/ACT inhaler Inhale 2 puffs into the lungs every 6 (six) hours as needed for  wheezing or shortness of breath.   folic acid (FOLVITE) 1 MG tablet 1 tablet   levothyroxine (SYNTHROID) 100 MCG tablet Take 100 mcg by mouth See admin instructions. On Saturdays and Sundays   levothyroxine (SYNTHROID) 88 MCG tablet Take 1 tablet (88 mcg total) by mouth See admin instructions. Monday-Friday   mometasone (NASONEX) 50 MCG/ACT nasal spray Place 2 sprays into the nose daily.   montelukast (SINGULAIR) 10 MG tablet Take 1 tablet every day by oral route.   nystatin cream (MYCOSTATIN) APPLY TO THE AFFECTED AREA(S) BY TOPICAL ROUTE 2 TIMES PER DAY x 14 days then prn   sulfaSALAzine (AZULFIDINE) 500 MG tablet Take 1,000 mg by mouth 2 (two) times daily.   triamcinolone (KENALOG) 0.1 % Apply 1 application  topically daily as needed (itching).   methotrexate (RHEUMATREX) 2.5 MG tablet Take 20 mg by mouth once a week. 8 tablets weekly (Patient not taking: Reported on 05/10/2022)   No facility-administered encounter medications on file as of 06/03/2022.    Allergies (verified) Patient has no known allergies.   History: Past Medical History:  Diagnosis Date   Arthritis    generalized   Breast cancer ([place]    [place] of [place]-outer [place] 03/[place]   Cough    GERD (gastroesophageal reflux disease)    uses PRN meds/with certain foods   Hypothyroidism    Nasal congestion    PONV (postoperative nausea and vomiting)  Rheumatoid arthritis(714.0)    Seasonal allergies    Seasonal allergies    Thyroid disease    hypothyroidism   Past Surgical History:  Procedure Laterality Date   BREAST LUMPECTOMY WITH RADIOACTIVE SEED LOCALIZATION Right 04/09/2015   Procedure: BREAST LUMPECTOMY WITH RADIOACTIVE SEED LOCALIZATION;  Surgeon: Rolm Bookbinder, MD;  Location: [place];  Service: General;  Laterality: Right;   BREAST REDUCTION SURGERY  2001   CALCANEAL OSTEOTOMY Left 10/11/2013   Procedure: LEFT CALCANEAL OSTEOTOMY;  Surgeon: Wylene Simmer, MD;  Location: [place];  Service: Orthopedics;  Laterality: Left;   CARPAL TUNNEL RELEASE     left   CERVICAL FUSION  2009   COLONOSCOPY     2002,2005,2008 w/B[facility]ie   COLONOSCOPY  2015   Brodie-MAC-prep good-TA- recall 5 yrs   GASTROCNEMIUS RECESSION Left 10/11/2013   Procedure: LEFT GASTROC RECESSION; LEFT POSTERIOR TIBIAL TENOLYSIS;  Surgeon: Wylene Simmer, MD;  Location: [place];  Service: Orthopedics;  Laterality: Left;   KNEE ARTHROSCOPY  2004   right   POLYPECTOMY     REMOVAL OF IMPLANT Left 02/14/2014   Procedure: LEFT FOOT REMOVAL OF DEEP IMPLANT;  Surgeon: Wylene Simmer, MD;  Location: [place];  Service: Orthopedics;  Laterality: Left;   REVERSE SHOULDER ARTHROPLASTY Right 03/05/2021   Procedure: REVERSE SHOULDER ARTHROPLASTY;  Surgeon: Tania Ade, MD;  Location: [facility];  Service: Orthopedics;  Laterality: Right;   SHOULDER ARTHROSCOPY W/ ROTATOR CUFF REPAIR  2006   right   THYROIDECTOMY     age 56   TONSILLECTOMY     TOTAL KNEE ARTHROPLASTY Right 06/21/2016   Procedure: RIGHT TOTAL KNEE ARTHROPLASTY;  Surgeon: Paralee Cancel, MD;  Location: [facility];  Service: Orthopedics;  Laterality: Right;   TUBAL LIGATION     WRIST ARTHRODESIS  2003   left    Family History  Problem Relation Age of Onset   Colon polyps Mother    Hypertension Mother    Heart disease Father    Heart attack Father    Other Sister        high breast density   Colon polyps Brother    Prostate cancer Brother 79       unknown Gleason   Colon polyps Brother    Prostate cancer Maternal Uncle    Prostate cancer Maternal Uncle    Prostate cancer Maternal Uncle    Breast cancer Paternal Aunt        dx. 68 or younger   Colon cancer Paternal Uncle    Prostate cancer Maternal Grandfather        dx. older age   Colon polyps Son 51       colonoscopy for unspecified reason; found some colon polyps, has not had one since   Breast cancer Cousin         4 maternal cousin had breast cancer    Colon cancer Other    Cancer Other        MGM's sister   Rectal cancer Neg Hx    Stomach cancer Neg Hx    Esophageal cancer Neg Hx    Social History   Socioeconomic History   Marital status: Married    Spouse name: Not on file   Number of children: Not on file   Years of education: Not on file   Highest education level: Not on file  Occupational History   Occupation: retired  Tobacco Use   Smoking status: Former  Packs/day: 0.25    Years: 21.00    Total pack years: 5.25    Types: Cigarettes    Quit date: 02/13/1984    Years since quitting: 38.3   Smokeless tobacco: Never  Vaping Use   Vaping Use: Never used  Substance and Sexual Activity   Alcohol use: No   Drug use: No   Sexual activity: Yes    Birth control/protection: Post-menopausal  Other Topics Concern   Not on file  Social History Narrative   Not on file   Social Determinants of Health   Financial Resource Strain: Low Risk  (06/03/2022)   Overall Financial Resource Strain (CARDIA)    Difficulty of Paying Living Expenses: Not hard at all  Food Insecurity: No Food Insecurity (06/03/2022)   Hunger Vital Sign    Worried About Running Out of Food in the Last Year: Never true    Ran Out of Food in the Last Year: Never true  Transportation Needs: No Transportation Needs (06/03/2022)   PRAPARE - Hydrologist (Medical): No    Lack of Transportation (Non-Medical): No  Physical Activity: Sufficiently Active (06/03/2022)   Exercise Vital Sign    Days of Exercise per Week: 4 days    Minutes of Exercise per Session: 60 min  Stress: No Stress Concern Present (06/03/2022)   [place]    Feeling of Stress : Not at all  Social Connections: Not on file    Tobacco Counseling Counseling given: Not Answered   Clinical Intake:  Pre-visit preparation completed: Yes  Pain : No/denies  pain     Nutritional Status: BMI 25 -29 Overweight Nutritional Risks: None Diabetes: No  How often do you need to have someone help you when you read instructions, pamphlets, or other written materials from your doctor or pharmacy?: 1 - Never  Diabetic? no  Interpreter Needed?: No  Information entered by :: NAllen LPN   Activities of Daily Living    06/03/2022    2:00 PM  In your present state of health, do you have any difficulty performing the following activities:  Hearing? 0  Vision? 1  Comment blurry sometimes  Difficulty concentrating or making decisions? 0  Walking or climbing stairs? 0  Dressing or bathing? 0  Doing errands, shopping? 0  Preparing Food and eating ? N  Using the Toilet? N  In the past six months, have you accidently leaked urine? N  Do you have problems with loss of bowel control? N  Managing your Medications? N  Managing your Finances? N  Housekeeping or managing your Housekeeping? N    Patient Care Team: [name], MD as PCP - General (Internal Medicine) Earnstine Regal, PA-C as Physician Assistant (Obstetrics and Gynecology) Rolm Bookbinder, MD as Consulting Physician (General Surgery) Truitt Merle, MD as Consulting Physician (Hematology) Thea Silversmith, MD as Consulting Physician (Radiation Oncology) Mauro Kaufmann, RN as Registered Nurse Rockwell Germany, RN as Registered Nurse Jake Shark Johny Blamer, NP as Nurse Practitioner (Hematology and Oncology)  Indicate any recent Medical Services you may have received from other than [facility] providers in the past year (date may be approximate).     Assessment:   This is a routine wellness examination for Evelyn Orr.  Hearing/Vision screen Vision Screening - Comments:: Regular eye exams, [facility]  Dietary issues and exercise activities discussed: Current Exercise Habits: Home exercise routine, Type of exercise: walking;strength training/weights, Time (Minutes): 60, Frequency  (Times/Week):  4, Weekly Exercise (Minutes/Week): 240   Goals Addressed             This Visit's Progress    Patient Stated       06/03/2022, no goals       Depression Screen    06/03/2022    2:00 PM 05/14/2021    9:08 AM 05/07/2021    9:19 AM 04/23/2020    9:12 AM 09/04/2019    2:08 PM 04/10/2019   12:20 PM 03/19/2019    8:46 AM  PHQ 2/9 Scores  PHQ - 2 Score 0 0 0 0 0 0 0  PHQ- 9 Score      3     Fall Risk    06/03/2022    2:00 PM 05/14/2021    9:08 AM 05/07/2021    9:19 AM 04/23/2020    9:12 AM 04/10/2019   12:20 PM  Fall Risk   Falls in the past year? 0 0 0 0 0  Number falls in past yr: 0  0  0  Injury with Fall? 0  0    Risk for fall due to : Medication side effect No Fall Risks  Medication side effect Medication side effect  Follow up Falls prevention discussed;Education provided;Falls evaluation completed Falls evaluation completed;Education provided;Falls prevention discussed  Falls evaluation completed;Education provided;Falls prevention discussed Falls evaluation completed;Falls prevention discussed    FALL RISK PREVENTION PERTAINING TO THE HOME:  Any stairs in or around the home? Yes  If so, are there any without handrails? No  Home free of loose throw rugs in walkways, pet beds, electrical cords, etc? Yes  Adequate lighting in your home to reduce risk of falls? Yes   ASSISTIVE DEVICES UTILIZED TO PREVENT FALLS:  Life alert? No  Use of a cane, walker or w/c? No  Grab bars in the bathroom? No  Shower chair or bench in shower? Yes  Elevated toilet seat or a handicapped toilet? Yes   TIMED UP AND GO:  Was the test performed? No .      Cognitive Function:        06/03/2022    2:01 PM 05/14/2021    9:08 AM 04/23/2020    9:13 AM 04/10/2019   12:23 PM  6CIT Screen  What Year? 0 points 0 points 0 points 0 points  What month? 0 points 0 points 0 points 0 points  What time? 0 points 0 points 0 points 0 points  Count back from 20 0 points 0 points 0  points 0 points  Months in reverse 0 points 0 points 0 points 0 points  Repeat phrase 4 points 4 points 2 points 2 points  Total Score 4 points 4 points 2 points 2 points    Immunizations Immunization History  Administered Date(s) Administered   PFIZER(Purple Top)SARS-COV-2 Vaccination 07/06/2019, 07/27/2019, 01/30/2020    TDAP status: Due, Education has been provided regarding the importance of this vaccine. Advised may receive this vaccine at local pharmacy or Health Dept. Aware to provide a copy of the vaccination record if obtained from local pharmacy or Health Dept. Verbalized acceptance and understanding.  Flu Vaccine status: Declined, Education has been provided regarding the importance of this vaccine but patient still declined. Advised may receive this vaccine at local pharmacy or Health Dept. Aware to provide a copy of the vaccination record if obtained from local pharmacy or Health Dept. Verbalized acceptance and understanding.  Pneumococcal vaccine status: Declined,  Education has been provided regarding the importance  of this vaccine but patient still declined. Advised may receive this vaccine at local pharmacy or Health Dept. Aware to provide a copy of the vaccination record if obtained from local pharmacy or Health Dept. Verbalized acceptance and understanding.   Covid-19 vaccine status: Completed vaccines  Qualifies for Shingles Vaccine? Yes   Zostavax completed No   Shingrix Completed?: No.    Education has been provided regarding the importance of this vaccine. Patient has been advised to call insurance company to determine out of pocket expense if they have not yet received this vaccine. Advised may also receive vaccine at local pharmacy or Health Dept. Verbalized acceptance and understanding.  Screening Tests Health Maintenance  Topic Date Due   DTaP/Tdap/Td (1 - Tdap) Never done   COVID-19 Vaccine (5 - 2023-24 season) 01/29/2022   Medicare Annual Wellness (AWV)   05/14/2022   Zoster Vaccines- Shingrix (1 of 2) 08/09/2022 (Originally 03/01/1965)   INFLUENZA VACCINE  08/29/2022 (Originally 12/29/2021)   Pneumonia Vaccine 22+ Years old (1 - PCV) 05/11/2023 (Originally 02/28/2013)   MAMMOGRAM  04/28/2023   COLONOSCOPY (Pts 45-7yr Insurance coverage will need to be confirmed)  04/16/2024   DEXA SCAN  Completed   Hepatitis C Screening  Completed   HPV VACCINES  Aged Out    Health Maintenance  Health Maintenance Due  Topic Date Due   DTaP/Tdap/Td (1 - Tdap) Never done   COVID-19 Vaccine (5 - 2023-24 season) 01/29/2022   Medicare Annual Wellness (AWV)  05/14/2022    Colorectal cancer screening: Type of screening: Colonoscopy. Completed 04/16/2021. Repeat every 3 years  Mammogram status: Completed 04/27/2022. Repeat every year  Bone Density status: Completed 04/08/2021.  Lung Cancer Screening: (Low Dose CT Chest recommended if Age 75-80years, 30 pack-year currently smoking OR have quit w/in 15years.) does not qualify.   Lung Cancer Screening Referral: no  Additional Screening:  Hepatitis C Screening: does qualify; Completed 03/19/2019  Vision Screening: Recommended annual ophthalmology exams for early detection of glaucoma and other disorders of the eye. Is the patient up to date with their annual eye exam?  Yes  Who is the provider or what is the name of the office in which the patient attends annual eye exams? Dr. HHerbert DeanerIf pt is not established with a provider, would they like to be referred to a provider to establish care? No .   Dental Screening: Recommended annual dental exams for proper oral hygiene  Community Resource Referral / Chronic Care Management: CRR required this visit?  No   CCM required this visit?  No      Plan:     I have personally reviewed and noted the following in the patient's chart:   Medical and social history Use of alcohol, tobacco or illicit drugs  Current medications and supplements including opioid  prescriptions. Patient is not currently taking opioid prescriptions. Functional ability and status Nutritional status Physical activity Advanced directives List of other physicians Hospitalizations, surgeries, and ER visits in previous 12 months Vitals Screenings to include cognitive, depression, and falls Referrals and appointments  In addition, I have reviewed and discussed with patient certain preventive protocols, quality metrics, and best practice recommendations. A written personalized care plan for preventive services as well as general preventive health recommendations were provided to patient.     NKellie Simmering LPN   05/01/2978  Nurse Notes: none  Due to this being a virtual visit, the after visit summary with patients personalized plan was offered to patient via mail  or my-chart. Patient would like to access on my-chart

## 2022-06-22 DIAGNOSIS — Z79899 Other long term (current) drug therapy: Secondary | ICD-10-CM | POA: Diagnosis not present

## 2022-06-22 DIAGNOSIS — M0589 Other rheumatoid arthritis with rheumatoid factor of multiple sites: Secondary | ICD-10-CM | POA: Diagnosis not present

## 2022-06-22 DIAGNOSIS — M8580 Other specified disorders of bone density and structure, unspecified site: Secondary | ICD-10-CM | POA: Diagnosis not present

## 2022-06-22 DIAGNOSIS — M1990 Unspecified osteoarthritis, unspecified site: Secondary | ICD-10-CM | POA: Diagnosis not present

## 2022-06-22 DIAGNOSIS — M79643 Pain in unspecified hand: Secondary | ICD-10-CM | POA: Diagnosis not present

## 2022-07-02 ENCOUNTER — Ambulatory Visit: Payer: Self-pay | Admitting: Internal Medicine

## 2022-07-07 ENCOUNTER — Ambulatory Visit (INDEPENDENT_AMBULATORY_CARE_PROVIDER_SITE_OTHER): Payer: Medicare PPO | Admitting: Internal Medicine

## 2022-07-07 ENCOUNTER — Encounter: Payer: Self-pay | Admitting: Internal Medicine

## 2022-07-07 VITALS — BP 120/84 | HR 87 | Temp 98.5°F | Ht 61.0 in | Wt 156.6 lb

## 2022-07-07 DIAGNOSIS — Z853 Personal history of malignant neoplasm of breast: Secondary | ICD-10-CM

## 2022-07-07 DIAGNOSIS — L309 Dermatitis, unspecified: Secondary | ICD-10-CM | POA: Diagnosis not present

## 2022-07-07 DIAGNOSIS — D229 Melanocytic nevi, unspecified: Secondary | ICD-10-CM

## 2022-07-07 DIAGNOSIS — M069 Rheumatoid arthritis, unspecified: Secondary | ICD-10-CM | POA: Diagnosis not present

## 2022-07-07 DIAGNOSIS — Z6829 Body mass index (BMI) 29.0-29.9, adult: Secondary | ICD-10-CM | POA: Diagnosis not present

## 2022-07-07 MED ORDER — LEVOTHYROXINE SODIUM 88 MCG PO TABS: 88.0000 ug | ORAL_TABLET | ORAL | 2 refills | Status: DC

## 2022-07-07 MED ORDER — MONTELUKAST SODIUM 10 MG PO TABS: ORAL_TABLET | ORAL | 2 refills | Status: DC

## 2022-07-07 MED ORDER — TRIAMCINOLONE ACETONIDE 0.1 % EX CREA: 1.0000 | TOPICAL_CREAM | Freq: Every day | CUTANEOUS | 0 refills | Status: DC | PRN

## 2022-07-07 NOTE — Progress Notes (Signed)
I,Victoria T Hamilton,acting as a scribe for Maximino Greenland, MD.,have documented all relevant documentation on the behalf of Maximino Greenland, MD,as directed by  Maximino Greenland, MD while in the presence of Maximino Greenland, MD.    Subjective:     Patient ID: Evelyn Orr , female    DOB: 1945-09-12 , 77 y.o.   MRN: [id number]   Chief Complaint  Patient presents with   Referral    HPI  Pt presents today for referral to Dermatologist.  She reports scattered dark spots on both legs, periodically causes itchiness. Initially started a couple weeks ago.  She states she does not have a preferred Dermatologist. She would like the specialist to be local, in [place].      Past Medical History:  Diagnosis Date   Arthritis    generalized   Breast cancer ([place])    Breast cancer of upper-outer quadrant of right female breast ([place]) 03/20/2015   Cough    GERD (gastroesophageal reflux disease)    uses PRN meds/with certain foods   Hypothyroidism    Nasal congestion    PONV (postoperative nausea and vomiting)    Rheumatoid arthritis(714.0)    Seasonal allergies    Seasonal allergies    Thyroid disease    hypothyroidism     Family History  Problem Relation Age of Onset   Colon polyps Mother    Hypertension Mother    Heart disease Father    Heart attack Father    Other Sister        high breast density   Colon polyps Brother    Prostate cancer Brother 56       unknown Gleason   Colon polyps Brother    Prostate cancer Maternal Uncle    Prostate cancer Maternal Uncle    Prostate cancer Maternal Uncle    Breast cancer Paternal Aunt        dx. 44 or younger   Colon cancer Paternal Uncle    Prostate cancer Maternal Grandfather        dx. older age   Colon polyps Son 72       colonoscopy for unspecified reason; found some colon polyps, has not had one since   Breast cancer Cousin        4 maternal cousin had breast cancer    Colon cancer Other    Cancer Other         MGM's sister   Rectal cancer Neg Hx    Stomach cancer Neg Hx    Esophageal cancer Neg Hx      Current Outpatient Medications:    albuterol (VENTOLIN HFA) 108 (90 Base) MCG/ACT inhaler, Inhale 2 puffs into the lungs every 6 (six) hours as needed for wheezing or shortness of breath., Disp: 18 g, Rfl: 1   folic acid (FOLVITE) 1 MG tablet, 1 tablet, Disp: , Rfl:    levothyroxine (SYNTHROID) 100 MCG tablet, Take 100 mcg by mouth See admin instructions. On Saturdays and Sundays, Disp: , Rfl:    mometasone (NASONEX) 50 MCG/ACT nasal spray, Place 2 sprays into the nose daily., Disp: 17 g, Rfl: 2   nystatin cream (MYCOSTATIN), APPLY TO THE AFFECTED AREA(S) BY TOPICAL ROUTE 2 TIMES PER DAY x 14 days then prn, Disp: , Rfl:    sulfaSALAzine (AZULFIDINE) 500 MG tablet, Take 1,000 mg by mouth 2 (two) times daily., Disp: , Rfl:    levothyroxine (SYNTHROID) 88 MCG tablet, Take 1 tablet (88 mcg total)  by mouth See admin instructions. Monday-Friday, Disp: 90 tablet, Rfl: 2   methotrexate (RHEUMATREX) 2.5 MG tablet, Take 20 mg by mouth once a week. 8 tablets weekly (Patient not taking: Reported on 05/10/2022), Disp: , Rfl: 1   montelukast (SINGULAIR) 10 MG tablet, Take 1 tablet every day by oral route., Disp: 90 tablet, Rfl: 2   triamcinolone cream (KENALOG) 0.1 %, Apply 1 Application topically daily as needed (itching)., Disp: 30 g, Rfl: 0   No Known Allergies   Review of Systems  Constitutional: Negative.   Respiratory: Negative.    Cardiovascular: Negative.   Skin:  Positive for rash.  Neurological: Negative.   Psychiatric/Behavioral: Negative.       Today's Vitals   07/07/22 1423  BP: 120/84  Pulse: 87  Temp: 98.5 F (36.9 C)  SpO2: 98%  Weight: 156 lb 9.6 oz (71 kg)  Height: '5\' 1"'$  (1.549 m)  PainSc: 0-No pain   Body mass index is 29.59 kg/m.  Wt Readings from Last 3 Encounters:  07/07/22 156 lb 9.6 oz (71 kg)  06/03/22 154 lb (69.9 kg)  05/10/22 154 lb 3.2 oz (69.9 kg)    Objective:   Physical Exam Vitals and nursing note reviewed.  Constitutional:      Appearance: Normal appearance.  HENT:     Head: Normocephalic and atraumatic.     Nose:     Comments: Masked     Mouth/Throat:     Comments: Masked  Cardiovascular:     Rate and Rhythm: Normal rate and regular rhythm.     Heart sounds: Normal heart sounds.  Pulmonary:     Effort: Pulmonary effort is normal.     Breath sounds: Normal breath sounds.  Skin:    General: Skin is warm and dry.     Findings: Erythema and lesion present.     Comments: Hyperpigmented, irregularly shaped lesion on RLE Scaly, dry skin b/l LE. Additionally, hyperpigmented lesions b/l LE, some scaly and blanching  Neurological:     General: No focal deficit present.     Mental Status: She is alert.  Psychiatric:        Mood and Affect: Mood normal.        Behavior: Behavior normal.       Assessment And Plan:     1. Dermatitis Comments: I will refer her to Derm as requested. I will send rx triamcinolone cream to apply to affected area prn. - Ambulatory referral to Dermatology - triamcinolone cream (KENALOG) 0.1 %; Apply 1 Application topically daily as needed (itching).  Dispense: 30 g; Refill: 0  2. Nevus - Ambulatory referral to Dermatology  3. Rheumatoid arthritis involving both feet, unspecified whether rheumatoid factor present ([place]) Comments: Chronic, also followed by Rheum. She is currently on DMARD therapy. She will c/w MTX and sulfasalazine.  4. BMI 29.0-29.9,adult Comments: She is encouraged to aim for at least 150 minutes of exercise/week.  5. History of breast cancer  She was initially diagnosed in 03/2015. She is s/p right lumpectomy and 5 years antiestrogen therapy (anastrozole) from 06/20/15 - 06/19/20.  Adjuvant radiation was not recommended. She is no longer followed by Oncology.   Patient was given opportunity to ask questions. Patient verbalized understanding of the plan and was able to repeat key elements of  the plan. All questions were answered to their satisfaction.   I, Maximino Greenland, MD, have reviewed all documentation for this visit. The documentation on 07/07/22 for the exam, diagnosis, procedures, and orders  are all accurate and complete.   IF YOU HAVE BEEN REFERRED TO A SPECIALIST, IT MAY TAKE 1-2 WEEKS TO SCHEDULE/PROCESS THE REFERRAL. IF YOU HAVE NOT HEARD FROM US/SPECIALIST IN TWO WEEKS, PLEASE GIVE [place] A CALL AT [phone] X [phone].   THE PATIENT IS ENCOURAGED TO PRACTICE SOCIAL DISTANCING DUE TO THE COVID-19 PANDEMIC.

## 2022-07-15 DIAGNOSIS — Z87891 Personal history of nicotine dependence: Secondary | ICD-10-CM | POA: Diagnosis not present

## 2022-07-15 DIAGNOSIS — E538 Deficiency of other specified B group vitamins: Secondary | ICD-10-CM | POA: Diagnosis not present

## 2022-07-15 DIAGNOSIS — Z825 Family history of asthma and other chronic lower respiratory diseases: Secondary | ICD-10-CM | POA: Diagnosis not present

## 2022-07-15 DIAGNOSIS — R030 Elevated blood-pressure reading, without diagnosis of hypertension: Secondary | ICD-10-CM | POA: Diagnosis not present

## 2022-07-15 DIAGNOSIS — Z8249 Family history of ischemic heart disease and other diseases of the circulatory system: Secondary | ICD-10-CM | POA: Diagnosis not present

## 2022-07-15 DIAGNOSIS — E039 Hypothyroidism, unspecified: Secondary | ICD-10-CM | POA: Diagnosis not present

## 2022-07-15 DIAGNOSIS — M069 Rheumatoid arthritis, unspecified: Secondary | ICD-10-CM | POA: Diagnosis not present

## 2022-07-15 DIAGNOSIS — Z79620 Long term (current) use of immunosuppressive biologic: Secondary | ICD-10-CM | POA: Diagnosis not present

## 2022-07-15 DIAGNOSIS — J309 Allergic rhinitis, unspecified: Secondary | ICD-10-CM | POA: Diagnosis not present

## 2022-08-02 ENCOUNTER — Ambulatory Visit: Payer: Self-pay | Admitting: Internal Medicine

## 2022-08-04 ENCOUNTER — Ambulatory Visit: Payer: Medicare PPO | Admitting: Internal Medicine

## 2022-08-12 ENCOUNTER — Other Ambulatory Visit: Payer: Self-pay | Admitting: Internal Medicine

## 2022-08-12 DIAGNOSIS — L309 Dermatitis, unspecified: Secondary | ICD-10-CM

## 2022-08-31 ENCOUNTER — Ambulatory Visit: Payer: Medicare PPO | Admitting: Podiatry

## 2022-09-13 ENCOUNTER — Ambulatory Visit (INDEPENDENT_AMBULATORY_CARE_PROVIDER_SITE_OTHER): Payer: Medicare PPO | Admitting: Podiatry

## 2022-09-13 ENCOUNTER — Encounter: Payer: Self-pay | Admitting: Podiatry

## 2022-09-13 ENCOUNTER — Ambulatory Visit: Payer: Medicare PPO | Admitting: Podiatry

## 2022-09-13 VITALS — BP 145/76

## 2022-09-13 DIAGNOSIS — M79672 Pain in left foot: Secondary | ICD-10-CM

## 2022-09-13 DIAGNOSIS — L84 Corns and callosities: Secondary | ICD-10-CM

## 2022-09-13 NOTE — Progress Notes (Unsigned)
  Subjective:  Patient ID: Evelyn Orr, female    DOB: 1945/09/18,  MRN: [id number]  Evelyn Orr presents to clinic today for {jgcomplaint:23593}  Chief Complaint  Patient presents with   Nail Problem    [facility] PCP-Robyn Sanders PCP VST-07/2022   New problem(s): None. {jgcomplaint:23593}  PCP is Dorothyann Peng, MD.  No Known Allergies  Review of Systems: Negative except as noted in the HPI.  Objective: No changes noted in today's physical examination. Vitals:   09/13/22 0832  BP: (!) 145/76   Evelyn Orr is a pleasant 77 y.o. female {jgbodyhabitus:24098} AAO x 3.  Vascular CFT immediate b/l LE. Palpable DP/PT pulses b/l LE. Digital hair present b/l. Skin temperature gradient WNL b/l. No pain with calf compression b/l. No edema b/l.  No cyanosis or clubbing noted b/l LE.  Neurologic Normal speech. Oriented to person, place, and time. Protective sensation intact 5/5 intact bilaterally with 10g monofilament b/l. Vibratory sensation intact b/l.  Dermatologic Longitudinal surgical scars 1st and 2nd webspaces. Pedal integument with normal turgor, texture and tone BLE. No open wounds b/l LE.   Toenails 1-5 bilaterally well maintained with adequate length. No erythema, no edema, no drainage, no fluctuance.   Hyperkeratotic lesion(s) submet head 5 b/l feet.  No erythema, no edema, no drainage, no fluctuance.   Porokeratotic lesion plantar left heel.  Orthopedic: Normal muscle strength 5/5 to all lower extremity muscle groups bilaterally. No pain, crepitus or joint limitation noted with ROM b/l LE. No gross bony pedal deformities b/l. Patient ambulates independently without assistive aids.   Radiographs: None Assessment/Plan: No diagnosis found.  No orders of the defined types were placed in this encounter.   None {Jgplan:23602::"-Patient/POA to call should there be question/concern in the interim."}   Return in about 3 months (around 12/13/2022).  [name], DPM

## 2022-09-21 DIAGNOSIS — H5213 Myopia, bilateral: Secondary | ICD-10-CM | POA: Diagnosis not present

## 2022-09-21 DIAGNOSIS — Z961 Presence of intraocular lens: Secondary | ICD-10-CM | POA: Diagnosis not present

## 2022-09-21 DIAGNOSIS — H524 Presbyopia: Secondary | ICD-10-CM | POA: Diagnosis not present

## 2022-09-30 ENCOUNTER — Ambulatory Visit (INDEPENDENT_AMBULATORY_CARE_PROVIDER_SITE_OTHER): Payer: Medicare PPO | Admitting: Podiatry

## 2022-09-30 DIAGNOSIS — M7671 Peroneal tendinitis, right leg: Secondary | ICD-10-CM

## 2022-09-30 NOTE — Patient Instructions (Signed)
Look for Voltaren gel at the pharmacy over the counter or online (also known as diclofenac 1% gel). Apply to the painful areas 3-4x daily with the supplied dosing card. Allow to dry for 10 minutes before going into socks/shoes ? ? ?Peroneal Tendinopathy Rehab ?Ask your health care provider which exercises are safe for you. Do exercises exactly as told by your health care provider and adjust them as directed. It is normal to feel mild stretching, pulling, tightness, or discomfort as you do these exercises. Stop right away if you feel sudden pain or your pain gets worse. Do not begin these exercises until told by your health care provider. ?Stretching and range-of-motion exercises ?These exercises warm up your muscles and joints and improve the movement and flexibility of your ankle. These exercises also help to relieve pain and stiffness. ?Gastroc and soleus stretch, standing ? ?This is an exercise in which you stand on a step and use your body weight to stretch your calf muscles. To do this exercise: ?Stand on the edge of a step on the ball of your left / right foot. The ball of your foot is on the walking surface, right under your toes. ?Keep your other foot firmly on the same step. ?Hold on to the wall, a railing, or a chair for balance. ?Slowly lift your other foot, allowing your body weight to press your left / right heel down over the edge of the step. You should feel a stretch in your left / right calf (gastrocnemius and soleus). ?Hold this position for 15 seconds. ?Return both feet to the step. ?Repeat this exercise with a slight bend in your left / right knee. ?Repeat 5 times with your left / right knee straight and 5 times with your left / right knee bent. Complete this exercise 2 times a day. ?Strengthening exercises ?These exercises build strength and endurance in your foot and ankle. Endurance is the ability to use your muscles for a long time, even after they get tired. ?Ankle dorsiflexion with  band ? ? ?Secure a rubber exercise band or tube to an object, such as a table leg, that will not move when the band is pulled. ?Secure the other end of the band around your left / right foot. ?Sit on the floor, facing the object with your left / right leg extended. The band or tube should be slightly tense when your foot is relaxed. ?Slowly flex your left / right ankle and toes to bring your foot toward you (dorsiflexion). ?Hold this position for 15 seconds. ?Let the band or tube slowly pull your foot back to the starting position. ?Repeat 5 times. Complete this exercise 2 times a day. ?Ankle eversion ?Sit on the floor with your legs straight out in front of you. ?Loop a rubber exercise band or tube around the ball of your left / right foot. The ball of your foot is on the walking surface, right under your toes. ?Hold the ends of the band in your hands, or secure the band to a stable object. The band or tube should be slightly tense when your foot is relaxed. ?Slowly push your foot outward, away from your other leg (eversion). ?Hold this position for 15 seconds. ?Slowly return your foot to the starting position. ?Repeat 5 times. Complete this exercise 2 times a day. ?Plantar flexion, standing ? ?This exercise is sometimes called standing heel raise. ?Stand with your feet shoulder-width apart. ?Place your hands on a wall or table to steady yourself as   needed, but try not to use it for support. ?Keep your weight spread evenly over the width of your feet while you slowly rise up on your toes (plantar flexion). If told by your health care provider: ?Shift your weight toward your left / right leg until you feel challenged. ?Stand on your left / right leg only. ?Hold this position for 15 seconds. ?Repeat 2 times. Complete this exercise 2 times a day. ?Single leg stand ?Without shoes, stand near a railing or in a doorway. You may hold on to the railing or door frame as needed. ?Stand on your left / right foot. Keep your  big toe down on the floor and try to keep your arch lifted. ?Do not roll to the outside of your foot. ?If this exercise is too easy, you can try it with your eyes closed or while standing on a pillow. ?Hold this position for 15 seconds. ?Repeat 5 times. Complete this exercise 2 times a day. ?This information is not intended to replace advice given to you by your health care provider. Make sure you discuss any questions you have with your health care provider. ?Document Revised: 09/05/2018 Document Reviewed: 09/05/2018 ?Elsevier Patient Education ? 2020 [employer] ? ?

## 2022-09-30 NOTE — Progress Notes (Signed)
  Subjective:  Patient ID: Evelyn Orr, female    DOB: 1945-12-15,  MRN: [id number]  Chief Complaint  Patient presents with   Foot Pain    burning on side of right foot below the fifth met head - doesn't matter if she's wearing shoes or not - just achy burning pain    77 y.o. female presents with the above complaint. History confirmed with patient.  She returns for follow-up with a new issue with pain on the outside of the foot  Objective:  Physical Exam: warm, good capillary refill, no trophic changes or ulcerative lesions, normal DP and PT pulses, normal sensory exam, and pain to palpation of the insertion of the peroneus brevis tendon on the proximal dorsal fifth metatarsal base  Assessment:   1. Peroneal tendinitis, right      Plan:  Patient was evaluated and treated and all questions answered.  Discussed the etiology and treatment options for peroneal tendinitis including stretching, formal physical therapy with an eccentric exercises therapy plan, supportive shoegears such as a running shoe or sneaker, bracing, topical and oral medications.  We also discussed that I do not routinely perform injections in this area because of the risk of an increased damage or rupture of the tendon unless we properly immobilize the foot and CAM boot.  We also discussed the role of surgical treatment of this for patients who do not improve after exhausting non-surgical treatment options.  -Educated on stretching and icing of the affected limb. -Recommend she utilize Voltaren gel 3-4 times daily -Home exercise plan given to do twice daily -Tri-Lock ankle brace placed and dispensed to support and offload the lateral ankle and peroneal tendons. -If not improving transition to cam boot and perform injection next visit  Return in about 6 weeks (around 11/11/2022) for re-check peroneal tendinitis.

## 2022-10-11 DIAGNOSIS — M25511 Pain in right shoulder: Secondary | ICD-10-CM | POA: Diagnosis not present

## 2022-10-18 DIAGNOSIS — M1990 Unspecified osteoarthritis, unspecified site: Secondary | ICD-10-CM | POA: Diagnosis not present

## 2022-10-18 DIAGNOSIS — Z79899 Other long term (current) drug therapy: Secondary | ICD-10-CM | POA: Diagnosis not present

## 2022-10-18 DIAGNOSIS — M79643 Pain in unspecified hand: Secondary | ICD-10-CM | POA: Diagnosis not present

## 2022-10-18 DIAGNOSIS — M0589 Other rheumatoid arthritis with rheumatoid factor of multiple sites: Secondary | ICD-10-CM | POA: Diagnosis not present

## 2022-10-18 DIAGNOSIS — M7989 Other specified soft tissue disorders: Secondary | ICD-10-CM | POA: Diagnosis not present

## 2022-10-18 DIAGNOSIS — M8580 Other specified disorders of bone density and structure, unspecified site: Secondary | ICD-10-CM | POA: Diagnosis not present

## 2022-10-21 DIAGNOSIS — L821 Other seborrheic keratosis: Secondary | ICD-10-CM | POA: Diagnosis not present

## 2022-10-26 DIAGNOSIS — M25462 Effusion, left knee: Secondary | ICD-10-CM | POA: Diagnosis not present

## 2022-10-26 DIAGNOSIS — M1712 Unilateral primary osteoarthritis, left knee: Secondary | ICD-10-CM | POA: Diagnosis not present

## 2022-11-11 ENCOUNTER — Ambulatory Visit: Payer: Medicare PPO | Admitting: Podiatry

## 2022-11-16 IMAGING — MR MR FOOT*L* WO/W CM
4 of 10 series · 18 of 40 positions shown · IV contrast (8ml GAD)
Comparison: 07/30/2020

CLINICAL DATA: Soft tissue mass between the second and third
metatarsals. Mass under the fifth metatarsal.

EXAM:
MRI OF THE LEFT FOREFOOT WITHOUT AND WITH CONTRAST
TECHNIQUE: Multiplanar, multisequence MR imaging of the left forefoot was
performed both before and after administration of intravenous
contrast.
CONTRAST:  8mL GADAVIST GADOBUTROL 1 MMOL/ML IV SOLN

[Series 8: T2 fat-sat · coronal · 3.0mm · 0.27mm/px · 6 of 44 slices shown (1 of 3)]
[im 1/44]
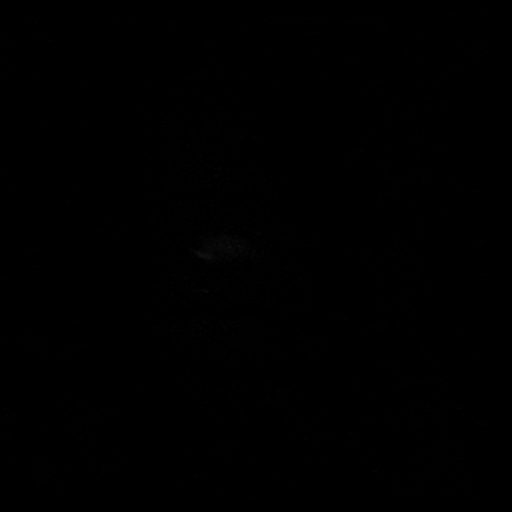
[im 9/44]
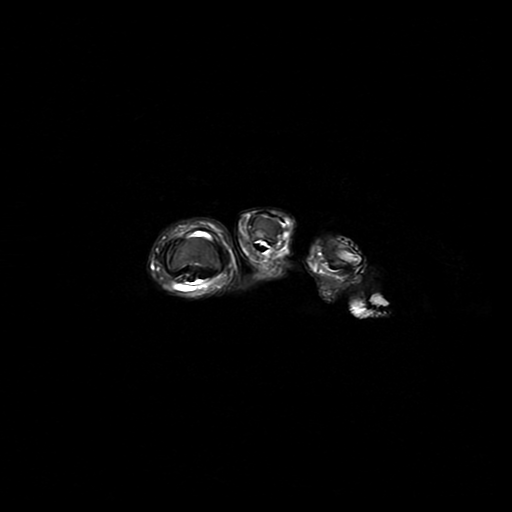
[im 18/44]
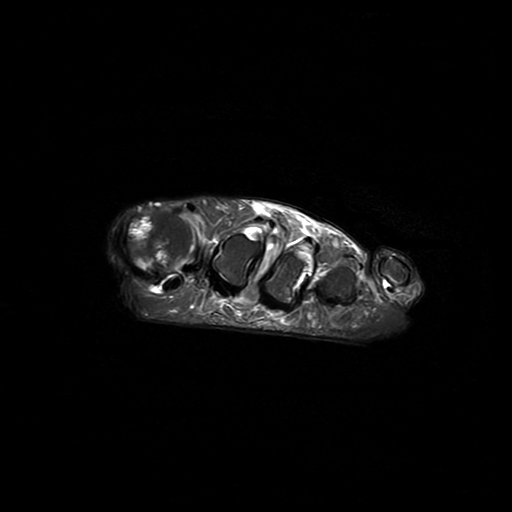
[im 26/44]
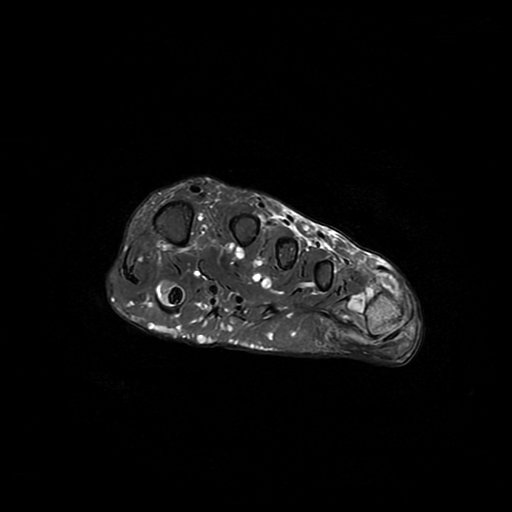
[im 35/44]
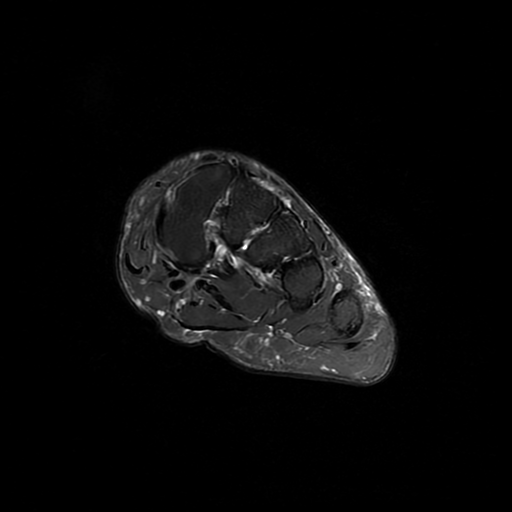
[im 44/44]
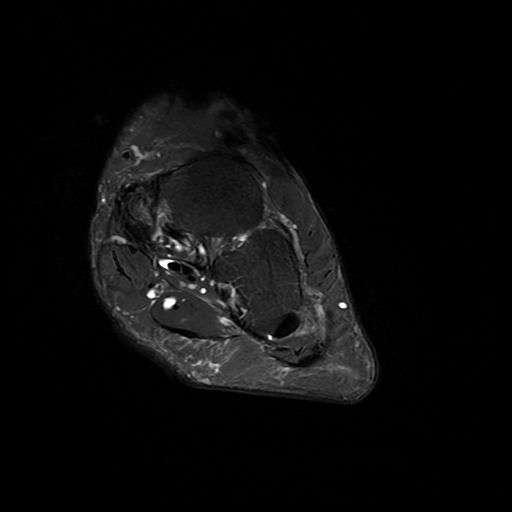

[Series 9: T1 · coronal · 3.0mm · 0.27mm/px · 6 of 44 slices shown]
[im 1/44]
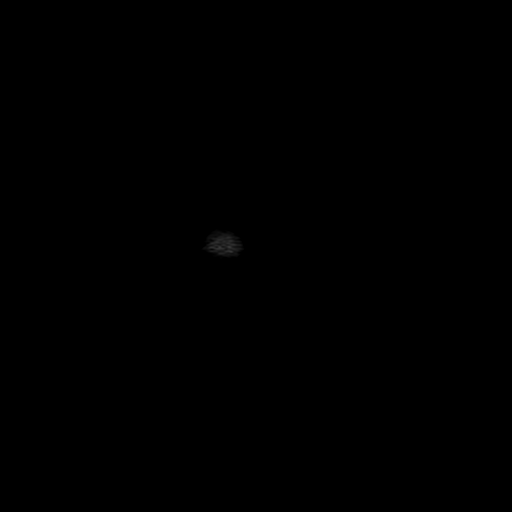
[im 9/44]
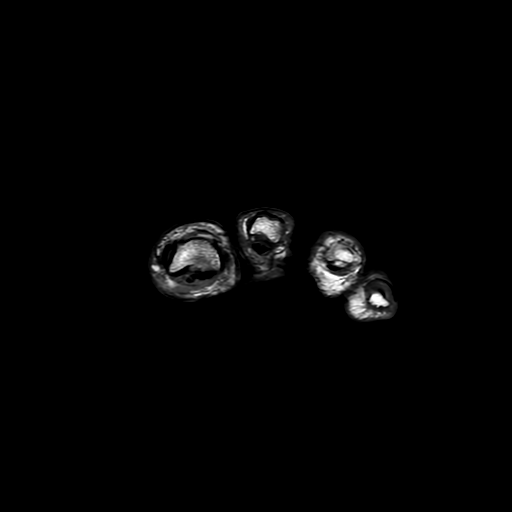
[im 18/44]
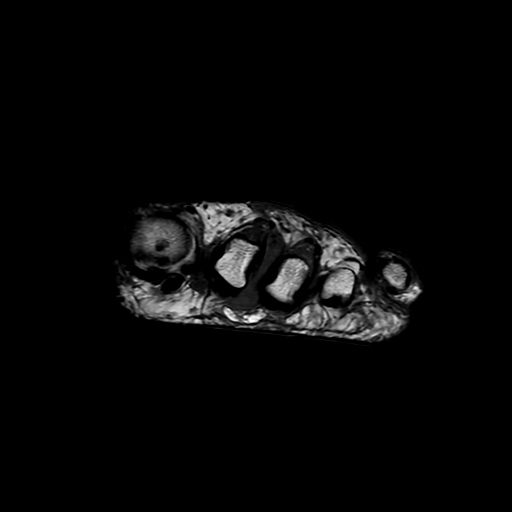
[im 26/44]
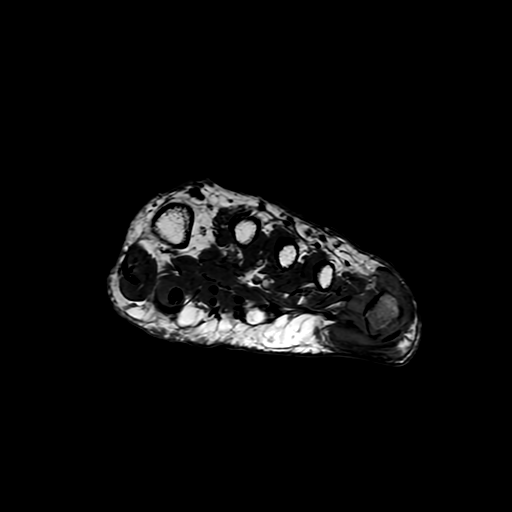
[im 35/44]
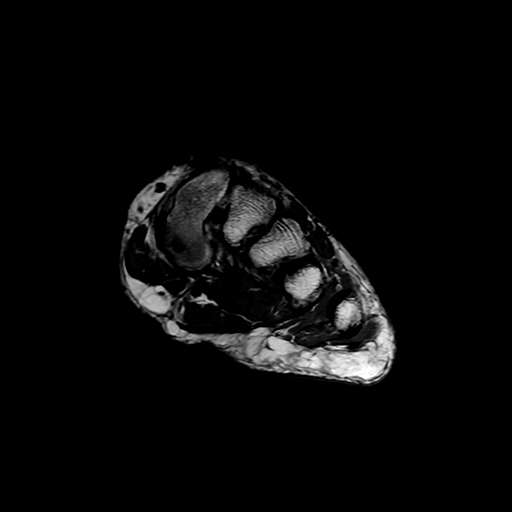
[im 44/44]
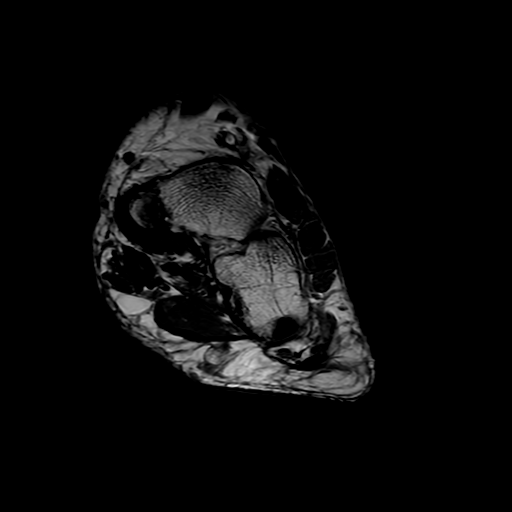

[Series 11: T2 fat-sat · axial · 3.0mm · 0.49mm/px · z∈[-13,+74]mm · 3 of 26 slices shown (2 of 3)]
[im 1/26]
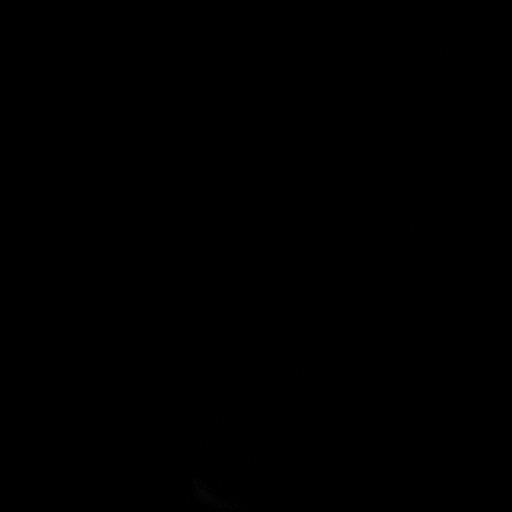
[im 13/26]
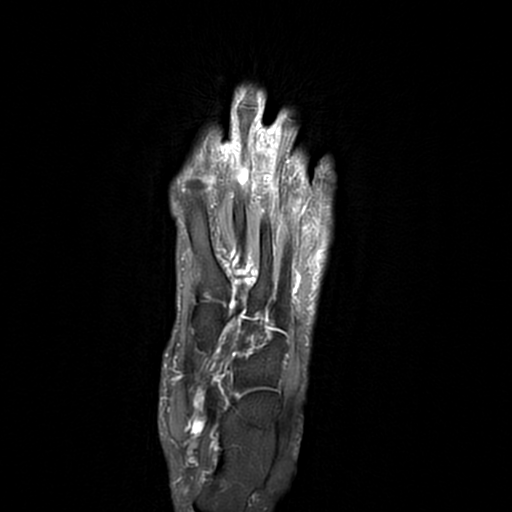
[im 26/26]
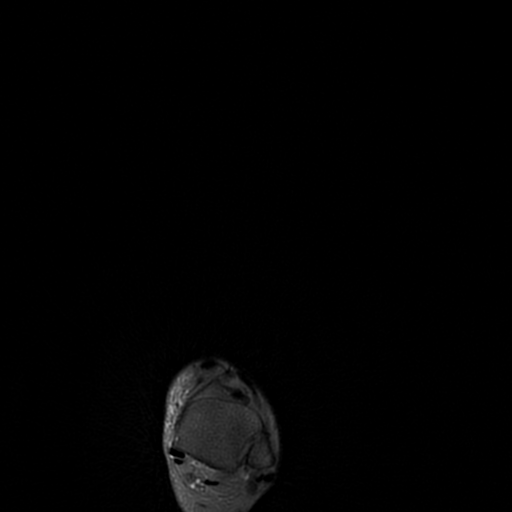

[Series 13: T2 fat-sat · sagittal · 3.0mm · 0.47mm/px · 3 of 28 slices shown (3 of 3)]
[im 1/28]
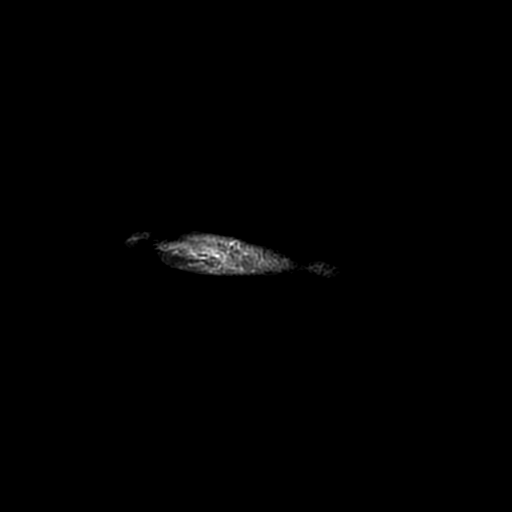
[im 14/28]
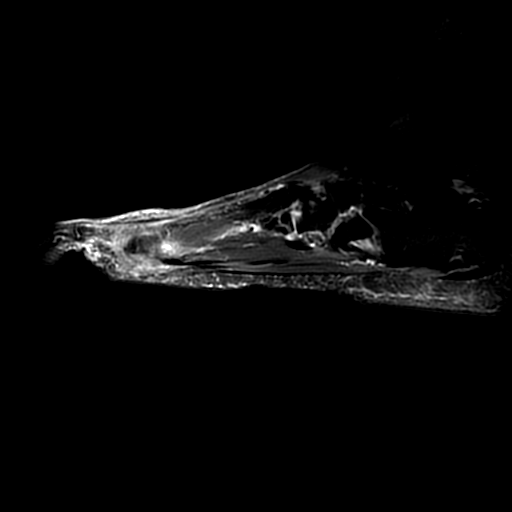
[im 28/28]
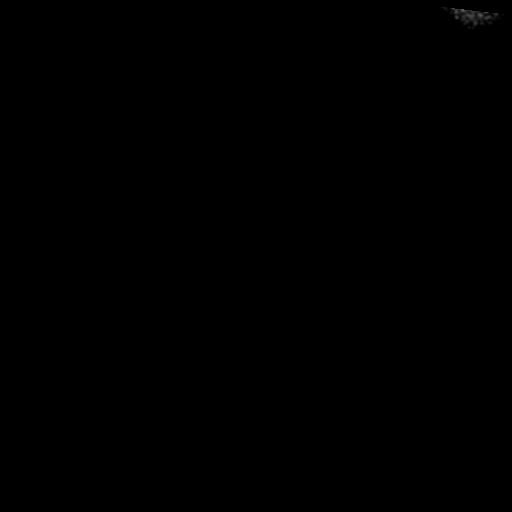

[18 of 40 positions shown; findings below may reference images not displayed]

FINDINGS: Bones/Joint/Cartilage

No fracture or dislocation. Normal alignment.

Moderate fifth MTP joint effusion with synovitis and marrow edema on
either side of the joint which may be secondary to an inflammatory
or crystalline arthropathy versus septic arthritis.

Hallux valgus with mild osteoarthritis of the first MTP joint.

Ligaments

Collateral ligaments are intact.  Lisfranc ligament is intact.

Muscles and Tendons

Severe tendinosis and mild tenosynovitis of the flexor hallucis
longus tendon. Muscles are normal.

Soft tissue

5 x 9 mm soft tissue mass along the plantar aspect of the first
intermetatarsal space consistent with a small neuroma.

Thick-walled fluid collection between the second and third
metatarsal heads with severe surrounding inflammatory changes
concerning for an a intermetatarsal bursa which may be secondarily
infected. Severe subcortical bone marrow edema in the adjacent
medial base of the third proximal phalanx with cortical irregularity
concerning for osteomyelitis versus reactive edema.

Thick-walled nonenhancing collapse adventitial bursa along the
plantar aspect of the fifth MCP joint likely reflecting changes from
chronic inflammation.
IMPRESSION: 1. A 5 x 9 mm neuroma along the plantar aspect of the first
intermetatarsal space.
2. Thick-walled fluid collection between the second and third
metatarsal heads with severe surrounding inflammatory changes
concerning for an intermetatarsal bursa which may be secondarily
infected. Severe subcortical bone marrow edema in the adjacent
medial base of the third proximal phalanx with cortical irregularity
concerning for osteomyelitis versus reactive edema.
3. Moderate fifth MTP joint effusion with synovitis and marrow edema
on either side of the joint which may be secondary to an
inflammatory or crystalline arthropathy versus septic arthritis.
4. Severe tendinosis and mild tenosynovitis of the flexor hallucis
longus tendon.

## 2022-11-22 ENCOUNTER — Other Ambulatory Visit: Payer: Self-pay

## 2022-11-22 MED ORDER — ALBUTEROL SULFATE HFA 108 (90 BASE) MCG/ACT IN AERS: 2.0000 | INHALATION_SPRAY | Freq: Four times a day (QID) | RESPIRATORY_TRACT | 1 refills | Status: DC | PRN

## 2022-12-10 ENCOUNTER — Other Ambulatory Visit: Payer: Self-pay

## 2022-12-10 ENCOUNTER — Ambulatory Visit
Admission: EM | Admit: 2022-12-10 | Discharge: 2022-12-10 | Disposition: A | Discharge status: Home / Self Care | Payer: Medicare PPO

## 2022-12-10 DIAGNOSIS — R21 Rash and other nonspecific skin eruption: Secondary | ICD-10-CM | POA: Diagnosis not present

## 2022-12-10 MED ORDER — TRIAMCINOLONE ACETONIDE 0.1 % EX CREA: 1.0000 | TOPICAL_CREAM | Freq: Two times a day (BID) | CUTANEOUS | 0 refills | Status: AC

## 2022-12-10 NOTE — ED Provider Notes (Signed)
[facility]    CSN: [id number] Arrival date & time: 12/10/22  1435      History   Chief Complaint Chief Complaint  Patient presents with   Rash    HPI Evelyn Orr is a 77 y.o. female.   Patient presents with itchy rash to chest and bilateral arms that have been present for about a week.  Denies any changes to lotions, soaps, detergents, foods, etc.  Denies any known sick contacts or fever.  Denies feelings of throat closing or shortness of breath.  Has taken Benadryl with minimal improvement.   Rash   Past Medical History:  Diagnosis Date   Arthritis    generalized   Breast cancer (HCC)    Breast cancer of upper-outer quadrant of right female breast (HCC) 03/20/2015   Cough    GERD (gastroesophageal reflux disease)    uses PRN meds/with certain foods   Hypothyroidism    Nasal congestion    PONV (postoperative nausea and vomiting)    Rheumatoid arthritis(714.0)    Seasonal allergies    Seasonal allergies    Thyroid disease    hypothyroidism    Patient Active Problem List   Diagnosis Date Noted   Other long term (current) drug therapy 08/05/2021   Osteoarthritis 08/05/2021   Vitamin D deficiency 05/13/2021   Hypothyroidism 05/13/2021   Primary hypothyroidism 05/07/2021   Influenza vaccination declined 05/07/2021   S/P reverse total shoulder arthroplasty, 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   Routine general medical examination at a health care facility 03/11/2018   Upper airway cough syndrome 09/22/2017   Overweight (BMI 25.0-29.9) 06/23/2016   S/P right TKA 06/21/2016   Osteopenia 06/05/2015   Genetic testing 04/14/2015   Family history of breast cancer 04/01/2015   Intraductal carcinoma in situ of breast 03/20/2015   Ductal carcinoma in situ of breast 03/20/2015   Flatfoot 10/11/2013   Achilles tendinitis 07/31/2013   Metatarsal deformity 07/31/2013   Pain in lower limb 07/31/2013   Rheumatoid arthritis (HCC) 03/17/2012   Thyroid disease 03/17/2012     Past Surgical History:  Procedure Laterality Date   BREAST LUMPECTOMY WITH RADIOACTIVE SEED LOCALIZATION Right 04/09/2015   Procedure: BREAST LUMPECTOMY WITH RADIOACTIVE SEED LOCALIZATION;  Surgeon: Emelia Loron, MD;  Location: [facility];  Service: General;  Laterality: Right;   BREAST REDUCTION SURGERY  2001   CALCANEAL OSTEOTOMY Left 10/11/2013   Procedure: LEFT CALCANEAL OSTEOTOMY;  Surgeon: Toni Arthurs, MD;  Location: [facility];  Service: Orthopedics;  Laterality: Left;   CARPAL TUNNEL RELEASE     left   CERVICAL FUSION  2009   COLONOSCOPY     2002,2005,2008 w/[facility]ie   COLONOSCOPY  2015   [facility]rodie-MAC-prep good-TA- recall 5 yrs   GASTROCNEMIUS RECESSION Left 10/11/2013   Procedure: LEFT GASTROC RECESSION; LEFT POSTERIOR TIBIAL TENOLYSIS;  Surgeon: Toni Arthurs, MD;  Location: [facility];  Service: Orthopedics;  Laterality: Left;   KNEE ARTHROSCOPY  2004   right   POLYPECTOMY     REMOVAL OF IMPLANT Left 02/14/2014   Procedure: LEFT FOOT REMOVAL OF DEEP IMPLANT;  Surgeon: Toni Arthurs, MD;  Location:  SURGERY CENTER;  Service: Orthopedics;  Laterality: Left;   REVERSE SHOULDER ARTHROPLASTY Right 03/05/2021   Procedure: REVERSE SHOULDER ARTHROPLASTY;  Surgeon: Jones Broom, MD;  Location: [facility];  Service: Orthopedics;  Laterality: Right;   SHOULDER ARTHROSCOPY W/ ROTATOR CUFF REPAIR  2006   right   THYROIDECTOMY     age 44  TONSILLECTOMY     TOTAL KNEE ARTHROPLASTY Right 06/21/2016   Procedure: RIGHT TOTAL KNEE ARTHROPLASTY;  Surgeon: Durene Romans, MD;  Location: [facility];  Service: Orthopedics;  Laterality: Right;   TUBAL LIGATION     WRIST ARTHRODESIS  2003   left     OB History   No obstetric history on file.      Home Medications    Prior to Admission medications   Medication Sig Start Date End Date Taking? Authorizing Provider  albuterol (VENTOLIN HFA) 108 (90 Base) MCG/ACT inhaler Inhale 2 puffs  into the lungs every 6 (six) hours as needed for wheezing or shortness of breath. 11/22/22  Yes Dorothyann Peng, MD  diphenhydrAMINE (BENADRYL) 25 mg capsule Take 25 mg by mouth every 6 (six) hours as needed.   Yes [provider]  fluconazole (DIFLUCAN) 150 MG tablet TAKE 1 TABLET AND REPEAT 3 DAYS 03/23/22  Yes [provider]  folic acid (FOLVITE) 1 MG tablet 1 tablet   Yes [provider]  leflunomide (ARAVA) 20 MG tablet TAKE 1 TABLET BY MOUTH EVERY DAY FOR 30 DAYS for 30   Yes [provider]  levothyroxine (SYNTHROID) 100 MCG tablet Take 100 mcg by mouth See admin instructions. On Saturdays and Sundays   Yes [provider]  levothyroxine (SYNTHROID) 88 MCG tablet Take 1 tablet (88 mcg total) by mouth See admin instructions. Monday-Friday 07/07/22  Yes Dorothyann Peng, MD  mometasone (NASONEX) 50 MCG/ACT nasal spray Place 2 sprays into the nose daily. 09/04/19  Yes Dorothyann Peng, MD  montelukast (SINGULAIR) 10 MG tablet Take 1 tablet every day by oral route. 07/07/22  Yes Dorothyann Peng, MD  montelukast (SINGULAIR) 10 MG tablet Take 1 tablet by mouth daily. 07/07/22  Yes [provider]  nystatin ointment (MYCOSTATIN) APPLY TO THE AFFECTED AREA(S) BY TOPICAL ROUTE 2 TIMES PER DAY x 14 DAYS THEN AS NEEDED 01/26/22  Yes [provider]  predniSONE (DELTASONE) 5 MG tablet 6 tablets once a day for 3 days, 5 tablets once a day for 3 days, 4 tablets once a day for 3 days, 3 tablets once a day for 3 days, 2 tablets once a day for 3 days, 1 tablet once a day for 3 days Orally for 18 days 01/11/22  Yes [provider]  sulfaSALAzine (AZULFIDINE) 500 MG tablet Take 1,000 mg by mouth 2 (two) times daily. 10/23/19  Yes [provider]  triamcinolone cream (KENALOG) 0.1 % Apply 1 Application topically 2 (two) times daily. 12/10/22  Yes Gustavus Bryant, FNP  Cholecalciferol 50 MCG (2000 UT) TABS Orally    [provider]   ERGOCALCIFEROL PO TAKE 1 CAPSULE (50000UNITS) BY ORAL ROUTE 2 TIMES EVERY WEEK TUES/FRI    [provider]  folic acid (FOLVITE) 1 MG tablet 1 tablet Orally Once a day for 90 days    [provider]  Levothyroxine Sodium 100 MCG CAPS Friday-Sunday    [provider]  methotrexate (RHEUMATREX) 2.5 MG tablet Take 20 mg by mouth once a week. 8 tablets weekly Patient not taking: Reported on 05/10/2022 11/22/17   [provider]  methotrexate (RHEUMATREX) 2.5 MG tablet take 8 tablets once a week orally Orally once a week for 90 days    [provider]  methotrexate (RHEUMATREX) 2.5 MG tablet Take 8 tablets by mouth once a week.    [provider]  nystatin cream (MYCOSTATIN) APPLY TO THE AFFECTED AREA(S) BY TOPICAL ROUTE 2 TIMES  PER DAY x 14 days then prn 12/09/21   [provider]  peg 3350 powder (MOVIPREP) 100 g SOLR     [provider]  Phenylephrine-Chlorphen-DM (NOREL CS PO) TAKE 1 TEASPOONFUL BY MOUTH 3 TIMES A DAY AS NEEDED    [provider]  sulfaSALAzine (AZULFIDINE) 500 MG tablet Take 2 tablets by mouth 2 (two) times daily.    [provider]  TRUXIMA 500 MG/50ML injection as directed Intravenous    [provider]    Family History Family History  Problem Relation Age of Onset   Colon polyps Mother    Hypertension Mother    Heart disease Father    Heart attack Father    Other Sister        high breast density   Colon polyps Brother    Prostate cancer Brother 1       unknown Gleason   Colon polyps Brother    Prostate cancer Maternal Uncle    Prostate cancer Maternal Uncle    Prostate cancer Maternal Uncle    Breast cancer Paternal Aunt        dx. 35 or younger   Colon cancer Paternal Uncle    Prostate cancer Maternal Grandfather        dx. older age   Colon polyps Son 64       colonoscopy for unspecified reason; found some colon polyps, has not had one since   Breast cancer  Cousin        4 maternal cousin had breast cancer    Colon cancer Other    Cancer Other        MGM's sister   Rectal cancer Neg Hx    Stomach cancer Neg Hx    Esophageal cancer Neg Hx     Social History Social History   Tobacco Use   Smoking status: Former    Current packs/day: 0.00    Average packs/day: 0.3 packs/day for 21.0 years (5.3 ttl pk-yrs)    Types: Cigarettes    Start date: 02/13/1963    Quit date: 02/13/1984    Years since quitting: 38.8   Smokeless tobacco: Never  Vaping Use   Vaping status: Never Used  Substance Use Topics   Alcohol use: No   Drug use: No     Allergies   Patient has no known allergies.   Review of Systems Review of Systems Per HPI  Physical Exam Triage Vital Signs ED Triage Vitals  Encounter Vitals Group     BP 12/10/22 1446 128/83     Systolic BP Percentile --      Diastolic BP Percentile --      Pulse Rate 12/10/22 1446 83     Resp 12/10/22 1446 18     Temp 12/10/22 1446 98.1 F (36.7 C)     Temp Source 12/10/22 1446 Oral     SpO2 12/10/22 1446 96 %     Weight 12/10/22 1442 152 lb (68.9 kg)     Height 12/10/22 1442 5\' 1"  (1.549 m)     Head Circumference --      Peak Flow --      Pain Score 12/10/22 1442 0     Pain Loc --      Pain Education --      Exclude from Growth Chart --    No data found.  Updated Vital Signs BP 128/83 (BP Location: Left Arm)   Pulse 83   Temp 98.1 F (36.7 C) (  Oral)   Resp 18   Ht 5\' 1"  (1.549 m)   Wt 152 lb (68.9 kg)   SpO2 96%   BMI 28.72 kg/m   Visual Acuity Right Eye Distance:   Left Eye Distance:   Bilateral Distance:    Right Eye Near:   Left Eye Near:    Bilateral Near:     Physical Exam Constitutional:      General: She is not in acute distress.    Appearance: Normal appearance. She is not toxic-appearing or diaphoretic.  HENT:     Head: Normocephalic and atraumatic.  Eyes:     Extraocular Movements: Extraocular movements intact.     Conjunctiva/sclera:  Conjunctivae normal.  Pulmonary:     Effort: Pulmonary effort is normal.  Skin:    Comments: Patient has a very small patches of maculopapular rash present to bilateral elbows and center of chest.  Neurological:     General: No focal deficit present.     Mental Status: She is alert and oriented to person, place, and time. Mental status is at baseline.  Psychiatric:        Mood and Affect: Mood normal.        Behavior: Behavior normal.        Thought Content: Thought content normal.        Judgment: Judgment normal.      UC Treatments / Results  Labs (all labs ordered are listed, but only abnormal results are displayed) Labs Reviewed - No data to display  EKG   Radiology No results found.  Procedures Procedures (including critical care time)  Medications Ordered in UC Medications - No data to display  Initial Impression / Assessment and Plan / UC Course  I have reviewed the triage vital signs and the nursing notes.  Pertinent labs & imaging results that were available during my care of the patient were reviewed by me and considered in my medical decision making (see chart for details).     Suspect some form of contact dermatitis that is allergic in nature.  Will treat with triamcinolone as patient denies that she has any of this available at home.  Advised supportive care and following up if any symptoms persist or worsen.  Patient verbalized understanding and was agreeable with plan. Final Clinical Impressions(s) / UC Diagnoses   Final diagnoses:  Rash and nonspecific skin eruption     Discharge Instructions      I have prescribed you a topical cream to apply to affected areas.  Follow-up if any symptoms persist or worsen.    ED Prescriptions     Medication Sig Dispense Auth. Provider   triamcinolone cream (KENALOG) 0.1 % Apply 1 Application topically 2 (two) times daily. 30 g Gustavus Bryant, [place]      PDMP not reviewed this encounter.   Gustavus Bryant,  [place] 12/10/22 1500

## 2022-12-10 NOTE — Discharge Instructions (Signed)
I have prescribed you a topical cream to apply to affected areas.  Follow-up if any symptoms persist or worsen.

## 2022-12-10 NOTE — ED Triage Notes (Signed)
"  I have a rash on both arms and on my chest". "It is itchy". First noticed last week.

## 2022-12-13 ENCOUNTER — Encounter: Payer: Self-pay | Admitting: Nurse Practitioner

## 2022-12-13 ENCOUNTER — Ambulatory Visit (INDEPENDENT_AMBULATORY_CARE_PROVIDER_SITE_OTHER): Payer: Medicare PPO | Admitting: Nurse Practitioner

## 2022-12-13 VITALS — BP 118/80 | HR 72 | Temp 98.1°F | Ht 61.0 in | Wt 150.0 lb

## 2022-12-13 DIAGNOSIS — R21 Rash and other nonspecific skin eruption: Secondary | ICD-10-CM | POA: Insufficient documentation

## 2022-12-13 DIAGNOSIS — L309 Dermatitis, unspecified: Secondary | ICD-10-CM

## 2022-12-13 MED ORDER — CETIRIZINE HCL 10 MG PO TABS: 10.0000 mg | ORAL_TABLET | Freq: Every day | ORAL | Status: AC

## 2022-12-13 MED ORDER — PREDNISONE 20 MG PO TABS
ORAL_TABLET | ORAL | 0 refills | Status: DC
Start: 2022-12-13 — End: 2023-10-17

## 2022-12-13 NOTE — Progress Notes (Signed)
Evelyn Orr, CMA,acting as a Neurosurgeon for Evelyn Felts, FNP.,have documented all relevant documentation on the behalf of Evelyn Felts, FNP,as directed by  Evelyn Felts, FNP while in the presence of Evelyn Felts, FNP.  Subjective:  Patient ID: Evelyn Orr , female    DOB: 10-15-1945 , 77 y.o.   MRN: [id number]  Chief Complaint  Patient presents with   Rash    HPI  Patient presents today for a rash, Patient reports compliance with medications. Patient denies any chest pain, SOB, or headache. Rash to arms and chest last week and went to urgent care on Friday which is not effective. She does not feel as if getting any better. She has been taking benadryl gel tablets. She doe not take an antihistamine      Past Medical History:  Diagnosis Date   Arthritis    generalized   Breast cancer (HCC)    Breast cancer of upper-outer quadrant of right female breast (HCC) 03/20/2015   Cough    GERD (gastroesophageal reflux disease)    uses PRN meds/with certain foods   Hypothyroidism    Nasal congestion    PONV (postoperative nausea and vomiting)    Rheumatoid arthritis(714.0)    Seasonal allergies    Seasonal allergies    Thyroid disease    hypothyroidism     Family History  Problem Relation Age of Onset   Colon polyps Mother    Hypertension Mother    Heart disease Father    Heart attack Father    Other Sister        high breast density   Colon polyps Brother    Prostate cancer Brother 25       unknown Gleason   Colon polyps Brother    Prostate cancer Maternal Uncle    Prostate cancer Maternal Uncle    Prostate cancer Maternal Uncle    Breast cancer Paternal Aunt        dx. 35 or younger   Colon cancer Paternal Uncle    Prostate cancer Maternal Grandfather        dx. older age   Colon polyps Son 60       colonoscopy for unspecified reason; found some colon polyps, has not had one since   Breast cancer Cousin        4 maternal cousin had breast cancer    Colon  cancer Other    Cancer Other        MGM's sister   Rectal cancer Neg Hx    Stomach cancer Neg Hx    Esophageal cancer Neg Hx      Current Outpatient Medications:    albuterol (VENTOLIN HFA) 108 (90 Base) MCG/ACT inhaler, Inhale 2 puffs into the lungs every 6 (six) hours as needed for wheezing or shortness of breath., Disp: 18 g, Rfl: 1   cetirizine (ZYRTEC ALLERGY) 10 MG tablet, Take 1 tablet (10 mg total) by mouth daily., Disp: , Rfl:    Cholecalciferol 50 MCG (2000 UT) TABS, Orally, Disp: , Rfl:    diphenhydrAMINE (BENADRYL) 25 mg capsule, Take 25 mg by mouth every 6 (six) hours as needed., Disp: , Rfl:    ERGOCALCIFEROL PO, TAKE 1 CAPSULE (50000UNITS) BY ORAL ROUTE 2 TIMES EVERY WEEK TUES/FRI, Disp: , Rfl:    folic acid (FOLVITE) 1 MG tablet, 1 tablet, Disp: , Rfl:    folic acid (FOLVITE) 1 MG tablet, 1 tablet Orally Once a day for 90 days, Disp: , Rfl:  leflunomide (ARAVA) 20 MG tablet, TAKE 1 TABLET BY MOUTH EVERY DAY FOR 30 DAYS for 30, Disp: , Rfl:    levothyroxine (SYNTHROID) 100 MCG tablet, Take 100 mcg by mouth See admin instructions. On Saturdays and Sundays, Disp: , Rfl:    levothyroxine (SYNTHROID) 88 MCG tablet, Take 1 tablet (88 mcg total) by mouth See admin instructions. Monday-Friday, Disp: 90 tablet, Rfl: 2   Levothyroxine Sodium 100 MCG CAPS, Friday-Sunday, Disp: , Rfl:    methotrexate (RHEUMATREX) 2.5 MG tablet, Take 20 mg by mouth once a week. 8 tablets weekly, Disp: , Rfl: 1   methotrexate (RHEUMATREX) 2.5 MG tablet, take 8 tablets once a week orally Orally once a week for 90 days, Disp: , Rfl:    methotrexate (RHEUMATREX) 2.5 MG tablet, Take 8 tablets by mouth once a week., Disp: , Rfl:    mometasone (NASONEX) 50 MCG/ACT nasal spray, Place 2 sprays into the nose daily., Disp: 17 g, Rfl: 2   montelukast (SINGULAIR) 10 MG tablet, Take 1 tablet every day by oral route., Disp: 90 tablet, Rfl: 2   montelukast (SINGULAIR) 10 MG tablet, Take 1 tablet by mouth daily., Disp:  , Rfl:    nystatin cream (MYCOSTATIN), APPLY TO THE AFFECTED AREA(S) BY TOPICAL ROUTE 2 TIMES PER DAY x 14 days then prn, Disp: , Rfl:    nystatin ointment (MYCOSTATIN), APPLY TO THE AFFECTED AREA(S) BY TOPICAL ROUTE 2 TIMES PER DAY x 14 DAYS THEN AS NEEDED, Disp: , Rfl:    peg 3350 powder (MOVIPREP) 100 g SOLR, , Disp: , Rfl:    Phenylephrine-Chlorphen-DM (NOREL CS PO), TAKE 1 TEASPOONFUL BY MOUTH 3 TIMES A DAY AS NEEDED, Disp: , Rfl:    predniSONE (DELTASONE) 20 MG tablet, Take 2 tablets by mouth daily with breakfast for 5 days, Disp: 10 tablet, Rfl: 0   sulfaSALAzine (AZULFIDINE) 500 MG tablet, Take 1,000 mg by mouth 2 (two) times daily., Disp: , Rfl:    sulfaSALAzine (AZULFIDINE) 500 MG tablet, Take 2 tablets by mouth 2 (two) times daily., Disp: , Rfl:    triamcinolone cream (KENALOG) 0.1 %, Apply 1 Application topically 2 (two) times daily., Disp: 30 g, Rfl: 0   TRUXIMA 500 MG/50ML injection, as directed Intravenous, Disp: , Rfl:    No Known Allergies   Review of Systems  Constitutional: Negative.   Eyes: Negative.   Skin:  Positive for color change and rash.  Psychiatric/Behavioral: Negative.       Today's Vitals   12/13/22 1517  BP: 118/80  Pulse: 72  Temp: 98.1 F (36.7 C)  Weight: 150 lb (68 kg)  Height: 5\' 1"  (1.549 m)  PainSc: 0-No pain   Body mass index is 28.34 kg/m.  Wt Readings from Last 3 Encounters:  12/13/22 150 lb (68 kg)  12/10/22 152 lb (68.9 kg)  07/07/22 156 lb 9.6 oz (71 kg)     Objective:  Physical Exam Cardiovascular:     Rate and Rhythm: Normal rate and regular rhythm.     Pulses: Normal pulses.     Heart sounds:     Friction rub present.  Pulmonary:     Effort: Pulmonary effort is normal. No respiratory distress.     Breath sounds: Normal breath sounds. No wheezing.  Skin:    General: Skin is warm and dry.     Capillary Refill: Capillary refill takes less than 2 seconds.     Findings: Rash (fine papular rash, skin tone) present.   Neurological:  General: No focal deficit present.     Mental Status: She is oriented to person, place, and time.     Cranial Nerves: No cranial nerve deficit.     Motor: No weakness.         Assessment And Plan:  Dermatitis Assessment & Plan: Flesh colored fine rash to arms dermatitis vs poison ivy/poison oak  Orders: -     predniSONE; Take 2 tablets by mouth daily with breakfast for 5 days  Dispense: 10 tablet; Refill: 0  Other orders -     Cetirizine HCl; Take 1 tablet (10 mg total) by mouth daily.    Return if symptoms worsen or fail to improve.   Patient was given opportunity to ask questions. Patient verbalized understanding of the plan and was able to repeat key elements of the plan. All questions were answered to their satisfaction.   Jeanell Sparrow, FNP, have reviewed all documentation for this visit. The documentation on 12/13/22 for the exam, diagnosis, procedures, and orders are all accurate and complete.   IF YOU HAVE BEEN REFERRED TO A SPECIALIST, IT MAY TAKE 1-2 WEEKS TO SCHEDULE/PROCESS THE REFERRAL. IF YOU HAVE NOT HEARD FROM US/SPECIALIST IN TWO WEEKS, PLEASE GIVE [place] A CALL AT [phone].

## 2022-12-28 NOTE — Assessment & Plan Note (Signed)
Flesh colored fine rash to arms dermatitis vs poison ivy/poison oak

## 2023-01-09 ENCOUNTER — Other Ambulatory Visit: Payer: Self-pay | Admitting: Internal Medicine

## 2023-01-25 ENCOUNTER — Ambulatory Visit (INDEPENDENT_AMBULATORY_CARE_PROVIDER_SITE_OTHER): Payer: Medicare PPO | Admitting: Podiatry

## 2023-01-25 ENCOUNTER — Encounter: Payer: Self-pay | Admitting: Podiatry

## 2023-01-25 VITALS — BP 121/68 | HR 87

## 2023-01-25 DIAGNOSIS — M79672 Pain in left foot: Secondary | ICD-10-CM

## 2023-01-25 DIAGNOSIS — Q828 Other specified congenital malformations of skin: Secondary | ICD-10-CM | POA: Diagnosis not present

## 2023-01-25 DIAGNOSIS — M79671 Pain in right foot: Secondary | ICD-10-CM | POA: Diagnosis not present

## 2023-01-25 DIAGNOSIS — L84 Corns and callosities: Secondary | ICD-10-CM | POA: Diagnosis not present

## 2023-01-25 NOTE — Progress Notes (Addendum)
  Subjective:  Patient ID: Evelyn Orr, female    DOB: [birth year],  MRN: [id number]  77 y.o. female presents callus(es) left lower extremity. Aggravating factors include weightbearing with and without shoe gear. Pain is relieved with periodic professional debridement. Patient states her toes and fingers are cold at times. Chief Complaint  Patient presents with   Callouses    DFC/last visit with PCP sometime around the Spring.   New problem(s): None   PCP is Dorothyann Peng, MD , and last visit was July 07, 2022.  No Known Allergies  Review of Systems: Negative except as noted in the HPI.   Objective:   Vitals:   01/25/23 0816  BP: 121/68  Pulse: 54    Evelyn Orr is a pleasant 77 y.o. female WD, WN in NAD.Marland Kitchen AAO x 3.  Vascular Examination: Vascular status intact b/l with palpable pedal pulses. CFT immediate b/l. Pedal hair present. No edema. No pain with calf compression b/l. Skin temperature gradient WNL b/l. No varicosities noted. No cyanosis or clubbing noted.  Neurological Examination: Sensation grossly intact b/l with 10 gram monofilament. Vibratory sensation intact b/l.  Dermatological Examination: Pedal skin with normal turgor, texture and tone b/l. No open wounds nor interdigital macerations noted. Toenails 1-5 b/l well maintained with adequate length. No erythema, no edema, no drainage, no fluctuance. Hyperkeratotic lesion(s) submet head 5 b/l.  No erythema, no edema, no drainage, no fluctuance. Porokeratotic lesion(s) left heel. No erythema, no edema, no drainage, no fluctuance.  Musculoskeletal Examination: Muscle strength 5/5 to b/l LE.  No pain, crepitus noted b/l. No gross pedal deformities. Patient ambulates independently without assistive aids.   Radiographs: None  Assessment:   1. Callus   2. Porokeratosis   3. Pain in both feet    Plan:  -Patient was evaluated and treated. All patient's and/or POA's questions/concerns answered on  today's visit. -Patient to continue soft, supportive shoe gear daily. -Patient will discuss symptoms of cold fingers/toes with her PCP. May be early onset of neuropathy. -Callus(es) submet head 5 b/l pared utilizing sterile scalpel blade without complication or incident. Total number debrided =2. -Porokeratotic lesion(s) left heel pared and enucleated with sterile currette without incident. Total number of lesions debrided=1. -Patient/POA to call should there be question/concern in the interim.  Return in about 3 months (around 04/27/2023).  [name], DPM

## 2023-02-15 DIAGNOSIS — M8580 Other specified disorders of bone density and structure, unspecified site: Secondary | ICD-10-CM | POA: Diagnosis not present

## 2023-02-15 DIAGNOSIS — M1990 Unspecified osteoarthritis, unspecified site: Secondary | ICD-10-CM | POA: Diagnosis not present

## 2023-02-15 DIAGNOSIS — M7989 Other specified soft tissue disorders: Secondary | ICD-10-CM | POA: Diagnosis not present

## 2023-02-15 DIAGNOSIS — M79643 Pain in unspecified hand: Secondary | ICD-10-CM | POA: Diagnosis not present

## 2023-02-15 DIAGNOSIS — Z79899 Other long term (current) drug therapy: Secondary | ICD-10-CM | POA: Diagnosis not present

## 2023-02-15 DIAGNOSIS — M0589 Other rheumatoid arthritis with rheumatoid factor of multiple sites: Secondary | ICD-10-CM | POA: Diagnosis not present

## 2023-02-28 ENCOUNTER — Other Ambulatory Visit: Payer: Self-pay

## 2023-02-28 DIAGNOSIS — E039 Hypothyroidism, unspecified: Secondary | ICD-10-CM

## 2023-03-01 ENCOUNTER — Other Ambulatory Visit: Payer: Self-pay

## 2023-03-01 DIAGNOSIS — E039 Hypothyroidism, unspecified: Secondary | ICD-10-CM | POA: Diagnosis not present

## 2023-03-02 LAB — TSH+FREE T4
Free T4: 1.3 ng/dL (ref 0.82–1.77)
TSH: 3.74 u[IU]/mL (ref 0.450–4.500)

## 2023-04-16 ENCOUNTER — Other Ambulatory Visit: Payer: Self-pay | Admitting: Internal Medicine

## 2023-05-06 DIAGNOSIS — M069 Rheumatoid arthritis, unspecified: Secondary | ICD-10-CM | POA: Diagnosis not present

## 2023-05-06 DIAGNOSIS — Z853 Personal history of malignant neoplasm of breast: Secondary | ICD-10-CM | POA: Diagnosis not present

## 2023-05-06 DIAGNOSIS — M8588 Other specified disorders of bone density and structure, other site: Secondary | ICD-10-CM | POA: Diagnosis not present

## 2023-05-06 DIAGNOSIS — Z1231 Encounter for screening mammogram for malignant neoplasm of breast: Secondary | ICD-10-CM | POA: Diagnosis not present

## 2023-05-06 LAB — HM MAMMOGRAPHY

## 2023-05-06 LAB — HM DEXA SCAN

## 2023-05-18 ENCOUNTER — Encounter: Payer: Self-pay | Admitting: Podiatry

## 2023-05-18 ENCOUNTER — Ambulatory Visit: Payer: Medicare PPO | Admitting: Podiatry

## 2023-05-18 VITALS — Ht 61.0 in | Wt 150.0 lb

## 2023-05-18 DIAGNOSIS — M79672 Pain in left foot: Secondary | ICD-10-CM

## 2023-05-18 DIAGNOSIS — M79671 Pain in right foot: Secondary | ICD-10-CM | POA: Diagnosis not present

## 2023-05-18 DIAGNOSIS — Q828 Other specified congenital malformations of skin: Secondary | ICD-10-CM | POA: Diagnosis not present

## 2023-05-18 DIAGNOSIS — L84 Corns and callosities: Secondary | ICD-10-CM

## 2023-05-19 ENCOUNTER — Ambulatory Visit: Payer: Medicare PPO | Admitting: Internal Medicine

## 2023-05-19 ENCOUNTER — Encounter: Payer: Self-pay | Admitting: Internal Medicine

## 2023-05-19 VITALS — BP 110/74 | HR 85 | Temp 98.3°F | Ht 61.0 in | Wt 142.4 lb

## 2023-05-19 DIAGNOSIS — Z0000 Encounter for general adult medical examination without abnormal findings: Secondary | ICD-10-CM

## 2023-05-19 DIAGNOSIS — E039 Hypothyroidism, unspecified: Secondary | ICD-10-CM | POA: Diagnosis not present

## 2023-05-19 DIAGNOSIS — M069 Rheumatoid arthritis, unspecified: Secondary | ICD-10-CM

## 2023-05-19 DIAGNOSIS — Z853 Personal history of malignant neoplasm of breast: Secondary | ICD-10-CM

## 2023-05-19 DIAGNOSIS — E559 Vitamin D deficiency, unspecified: Secondary | ICD-10-CM

## 2023-05-19 NOTE — Progress Notes (Unsigned)
I,[name], CMA,acting as a Neurosurgeon for Evelyn Aliment, MD.,have documented all relevant documentation on the behalf of Evelyn Aliment, MD,as directed by  Evelyn Aliment, MD while in the presence of Evelyn Aliment, MD.  Subjective:    Patient ID: Evelyn Orr , female    DOB: 11/11/1945 , 77 y.o.   MRN: [id number]  Chief Complaint  Patient presents with  . Annual Exam  . Hypothyroidism    HPI  M-She is here today for a full physical examination.  She is followed by Henreitta Leber, PA for her GYN exam. She reports compliance with meds. Denies headaches, chest pain and shortness of breath.  Letter sent to [facility], [place] for mammogram.   Thyroid Problem Presents for follow-up visit. Patient reports no hoarse voice, leg swelling or visual change. The symptoms have been stable.     Past Medical History:  Diagnosis Date  . Arthritis    generalized  . Breast cancer (HCC)   . Breast cancer of upper-outer quadrant of right female breast (HCC) 03/20/2015  . Cough   . GERD (gastroesophageal reflux disease)    uses PRN meds/with certain foods  . Hypothyroidism   . Nasal congestion   . PONV (postoperative nausea and vomiting)   . Rheumatoid arthritis(714.0)   . Seasonal allergies   . Seasonal allergies   . Thyroid disease    hypothyroidism     Family History  Problem Relation Age of Onset  . Colon polyps Mother   . Hypertension Mother   . Heart disease Father   . Heart attack Father   . Other Sister        high breast density  . Colon polyps Brother   . Prostate cancer Brother 69       unknown Gleason  . Colon polyps Brother   . Prostate cancer Maternal Uncle   . Prostate cancer Maternal Uncle   . Prostate cancer Maternal Uncle   . Breast cancer Paternal Aunt        dx. 35 or younger  . Colon cancer Paternal Uncle   . Prostate cancer Maternal Grandfather        dx. older age  . Colon polyps Son 32       colonoscopy for unspecified reason; found some colon  polyps, has not had one since  . Breast cancer Cousin        4 maternal cousin had breast cancer   . Colon cancer Other   . Cancer Other        MGM's sister  . Rectal cancer Neg Hx   . Stomach cancer Neg Hx   . Esophageal cancer Neg Hx      Current Outpatient Medications:  .  albuterol (VENTOLIN HFA) 108 (90 Base) MCG/ACT inhaler, TAKE 2 PUFFS BY MOUTH EVERY 6 HOURS AS NEEDED FOR WHEEZE OR SHORTNESS OF BREATH, Disp: 18 each, Rfl: 1 .  cetirizine (ZYRTEC ALLERGY) 10 MG tablet, Take 1 tablet (10 mg total) by mouth daily., Disp: , Rfl:  .  Cholecalciferol 50 MCG (2000 UT) TABS, Orally, Disp: , Rfl:  .  diphenhydrAMINE (BENADRYL) 25 mg capsule, Take 25 mg by mouth every 6 (six) hours as needed., Disp: , Rfl:  .  ERGOCALCIFEROL PO, TAKE 1 CAPSULE (50000UNITS) BY ORAL ROUTE 2 TIMES EVERY WEEK TUES/FRI, Disp: , Rfl:  .  folic acid (FOLVITE) 1 MG tablet, 1 tablet, Disp: , Rfl:  .  folic acid (FOLVITE) 1 MG tablet, 1  tablet Orally Once a day for 90 days, Disp: , Rfl:  .  leflunomide (ARAVA) 20 MG tablet, TAKE 1 TABLET BY MOUTH EVERY DAY FOR 30 DAYS for 30, Disp: , Rfl:  .  levothyroxine (SYNTHROID) 100 MCG tablet, Take 100 mcg by mouth See admin instructions. On Saturdays and Sundays, Disp: , Rfl:  .  levothyroxine (SYNTHROID) 88 MCG tablet, Take 1 tablet (88 mcg total) by mouth See admin instructions. Monday-Friday, Disp: 90 tablet, Rfl: 2 .  Levothyroxine Sodium 100 MCG CAPS, Friday-Sunday, Disp: , Rfl:  .  methotrexate (RHEUMATREX) 2.5 MG tablet, take 8 tablets once a week orally Orally once a week for 90 days, Disp: , Rfl:  .  methotrexate (RHEUMATREX) 2.5 MG tablet, Take 8 tablets by mouth once a week., Disp: , Rfl:  .  mometasone (NASONEX) 50 MCG/ACT nasal spray, Place 2 sprays into the nose daily., Disp: 17 g, Rfl: 2 .  montelukast (SINGULAIR) 10 MG tablet, Take 1 tablet every day by oral route., Disp: 90 tablet, Rfl: 2 .  montelukast (SINGULAIR) 10 MG tablet, Take 1 tablet by mouth daily.,  Disp: , Rfl:  .  nystatin cream (MYCOSTATIN), APPLY TO THE AFFECTED AREA(S) BY TOPICAL ROUTE 2 TIMES PER DAY x 14 days then prn, Disp: , Rfl:  .  nystatin ointment (MYCOSTATIN), APPLY TO THE AFFECTED AREA(S) BY TOPICAL ROUTE 2 TIMES PER DAY x 14 DAYS THEN AS NEEDED, Disp: , Rfl:  .  peg 3350 powder (MOVIPREP) 100 g SOLR, , Disp: , Rfl:  .  Phenylephrine-Chlorphen-DM (NOREL CS PO), TAKE 1 TEASPOONFUL BY MOUTH 3 TIMES A DAY AS NEEDED, Disp: , Rfl:  .  predniSONE (DELTASONE) 20 MG tablet, Take 2 tablets by mouth daily with breakfast for 5 days, Disp: 10 tablet, Rfl: 0 .  sulfaSALAzine (AZULFIDINE) 500 MG tablet, Take 1,000 mg by mouth 2 (two) times daily., Disp: , Rfl:  .  sulfaSALAzine (AZULFIDINE) 500 MG tablet, Take 2 tablets by mouth 2 (two) times daily., Disp: , Rfl:  .  triamcinolone cream (KENALOG) 0.1 %, Apply 1 Application topically 2 (two) times daily., Disp: 30 g, Rfl: 0 .  TRUXIMA 500 MG/50ML injection, as directed Intravenous, Disp: , Rfl:  .  methotrexate (RHEUMATREX) 2.5 MG tablet, Take 20 mg by mouth once a week. 8 tablets weekly, Disp: , Rfl: 1   No Known Allergies    The patient states she uses post menopausal status for birth control. No LMP recorded. Patient is postmenopausal.. Negative for Dysmenorrhea. Negative for: breast discharge, breast lump(s), breast pain and breast self exam. Associated symptoms include abnormal vaginal bleeding. Pertinent negatives include abnormal bleeding (hematology), anxiety, decreased libido, depression, difficulty falling sleep, dyspareunia, history of infertility, nocturia, sexual dysfunction, sleep disturbances, urinary incontinence, urinary urgency, vaginal discharge and vaginal itching. Diet regular.The patient states her exercise level is  moderate - [employer].   . The patient's tobacco use is:  Social History   Tobacco Use  Smoking Status Former  . Current packs/day: 0.00  . Average packs/day: 0.3 packs/day for 21.0 years (5.3 ttl  pk-yrs)  . Types: Cigarettes  . Start date: 02/13/1963  . Quit date: 02/13/1984  . Years since quitting: 39.2  Smokeless Tobacco Never  . She has been exposed to passive smoke. The patient's alcohol use is:  Social History   Substance and Sexual Activity  Alcohol Use No    Review of Systems  Constitutional: Negative.   HENT: Negative.  Negative for hoarse voice.  Eyes: Negative.   Respiratory: Negative.    Cardiovascular: Negative.   Gastrointestinal: Negative.   Endocrine: Negative.   Genitourinary: Negative.   Musculoskeletal: Negative.   Skin: Negative.   Allergic/Immunologic: Negative.   Neurological: Negative.   Hematological: Negative.   Psychiatric/Behavioral: Negative.       Today's Vitals   05/19/23 1407  BP: 110/74  Pulse: 85  Temp: 98.3 F (36.8 C)  SpO2: 98%  Weight: 142 lb 6.4 oz (64.6 kg)  Height: 5\' 1"  (1.549 m)   Body mass index is 26.91 kg/m.  Wt Readings from Last 3 Encounters:  05/19/23 142 lb 6.4 oz (64.6 kg)  05/18/23 150 lb (68 kg)  12/13/22 150 lb (68 kg)     Objective:  Physical Exam      Assessment And Plan:     Encounter for general adult medical examination w/o abnormal findings  Primary hypothyroidism  Rheumatoid arthritis involving both feet, unspecified whether rheumatoid factor present (HCC)  Vitamin D deficiency disease  History of breast cancer     Return for 1 year HM, 6 month thyroid f/u. Marland Kitchen Patient was given opportunity to ask questions. Patient verbalized understanding of the plan and was able to repeat key elements of the plan. All questions were answered to their satisfaction.    I, Evelyn Aliment, MD, have reviewed all documentation for this visit. The documentation on 05/19/23 for the exam, diagnosis, procedures, and orders are all accurate and complete.

## 2023-05-19 NOTE — Patient Instructions (Signed)

## 2023-05-20 LAB — LIPID PANEL
Chol/HDL Ratio: 3.2 {ratio} (ref 0.0–4.4)
Cholesterol, Total: 211 mg/dL — ABNORMAL HIGH (ref 100–199)
HDL: 66 mg/dL (ref >39)
LDL Chol Calc (NIH): 123 mg/dL — ABNORMAL HIGH (ref 0–99)
Triglycerides: 126 mg/dL (ref 0–149)
VLDL Cholesterol Cal: 22 mg/dL (ref 5–40)

## 2023-05-20 LAB — CMP14+EGFR
ALT: 9 [IU]/L (ref 0–32)
AST: 20 [IU]/L (ref 0–40)
Albumin: 4.1 g/dL (ref 3.8–4.8)
Alkaline Phosphatase: 106 [IU]/L (ref 44–121)
BUN/Creatinine Ratio: 12 (ref 12–28)
BUN: 9 mg/dL (ref 8–27)
Bilirubin Total: 0.2 mg/dL (ref 0.0–1.2)
CO2: 23 mmol/L (ref 20–29)
Calcium: 9.2 mg/dL (ref 8.7–10.3)
Chloride: 104 mmol/L (ref 96–106)
Creatinine, Ser: 0.78 mg/dL (ref 0.57–1.00)
Globulin, Total: 2.9 g/dL (ref 1.5–4.5)
Glucose: 85 mg/dL (ref 70–99)
Potassium: 3.6 mmol/L (ref 3.5–5.2)
Sodium: 139 mmol/L (ref 134–144)
Total Protein: 7 g/dL (ref 6.0–8.5)
eGFR: 79 mL/min/{1.73_m2} (ref >59)

## 2023-05-20 LAB — TSH+FREE T4
Free T4: 0.94 ng/dL (ref 0.82–1.77)
TSH: 8.5 u[IU]/mL — ABNORMAL HIGH (ref 0.450–4.500)

## 2023-05-20 LAB — CBC
Hematocrit: 33.1 % — ABNORMAL LOW (ref 34.0–46.6)
Hemoglobin: 10.7 g/dL — ABNORMAL LOW (ref 11.1–15.9)
MCH: 29.9 pg (ref 26.6–33.0)
MCHC: 32.3 g/dL (ref 31.5–35.7)
MCV: 93 fL (ref 79–97)
Platelets: 329 10*3/uL (ref 150–450)
RBC: 3.58 x10E6/uL — ABNORMAL LOW (ref 3.77–5.28)
RDW: 11.9 % (ref 11.7–15.4)
WBC: 5.4 10*3/uL (ref 3.4–10.8)

## 2023-05-20 LAB — HEMOGLOBIN A1C
Est. average glucose Bld gHb Est-mCnc: 108 mg/dL
Hgb A1c MFr Bld: 5.4 % (ref 4.8–5.6)

## 2023-05-26 NOTE — Progress Notes (Signed)
  Subjective:  Patient ID: Evelyn Orr, female    DOB: August 02, 1945,  MRN: [id number]  Evelyn Orr presents to clinic today for callus(es) of both feet. Aggravating factors include weightbearing with and without shoe gear. Pain is relieved with periodic professional debridement.  Chief Complaint  Patient presents with   Nail Problem    Pt is here for RFC not a diabetic PCP is Dr Allyne Gee and LOV was 6 months ago.   New problem(s): None.   PCP is Dorothyann Peng, MD.  No Known Allergies  Review of Systems: Negative except as noted in the HPI.  Objective: No changes noted in today's physical examination. There were no vitals filed for this visit. AURBREE EURE is a pleasant 77 y.o. female WD, WN in NAD. AAO x 3.  Vascular Examination: Vascular status intact b/l with palpable pedal pulses. CFT immediate b/l. Pedal hair present. No edema. No pain with calf compression b/l. Skin temperature gradient WNL b/l. No varicosities noted. No cyanosis or clubbing noted.  Neurological Examination: Sensation grossly intact b/l with 10 gram monofilament. Vibratory sensation intact b/l.  Dermatological Examination: Pedal skin with normal turgor, texture and tone b/l. No open wounds nor interdigital macerations noted. Toenails 1-5 b/l well maintained with adequate length. No erythema, no edema, no drainage, no fluctuance.   Hyperkeratotic lesion(s) submet head 5 b/l.  No erythema, no edema, no drainage, no fluctuance.   Porokeratotic lesion(s) left heel. No erythema, no edema, no drainage, no fluctuance.  Musculoskeletal Examination: Muscle strength 5/5 to b/l LE.  No pain, crepitus noted b/l. No gross pedal deformities. Patient ambulates independently without assistive aids.   Radiographs: None Assessment/Plan: 1. Callus   2. Porokeratosis   3. Pain in both feet     -Consent given for treatment as described below: -Examined patient. -Toenails 1-5 b/l trimmed as a  courtesy. -Patient to continue soft, supportive shoe gear daily. -Callus(es) submet head 5 b/l pared utilizing sharp debridement with sterile blade without complication or incident. Total number debrided =2. -Porokeratotic lesion(s) left heel pared and enucleated with sterile currette without incident. Total number of lesions debrided=1. -Patient/POA to call should there be question/concern in the interim.   Return in about 3 months (around 08/16/2023).  [name], DPM      [place] LOCATION: [address], [place] [address]                   Office [phone]   [facility] LOCATION: [address] [place], [place] [address] Office [phone]

## 2023-05-30 ENCOUNTER — Encounter: Payer: Medicare PPO | Admitting: Internal Medicine

## 2023-06-02 ENCOUNTER — Encounter: Payer: Medicare PPO | Admitting: Internal Medicine

## 2023-06-02 DIAGNOSIS — Z853 Personal history of malignant neoplasm of breast: Secondary | ICD-10-CM | POA: Insufficient documentation

## 2023-06-02 MED ORDER — LEVOTHYROXINE SODIUM 88 MCG PO TABS: 88.0000 ug | ORAL_TABLET | ORAL | Status: DC

## 2023-06-02 NOTE — Assessment & Plan Note (Signed)
 Chronic, currently followed by Rheumatology.  She is currently on DMARD therapy.

## 2023-06-02 NOTE — Assessment & Plan Note (Signed)

## 2023-06-02 NOTE — Assessment & Plan Note (Addendum)
 Chronic, she is currently taking both Synthroid M-F.  She ran out of the dose several weeks ago.  I will check thyroid panel and adjust meds as needed.  She will f/u in in six months, sooner if needed

## 2023-06-02 NOTE — Assessment & Plan Note (Addendum)
 She is s/p treatment for DCIS. She was initially diagnosed in 03/2015. She is s/p right lumpectomy and 5 years antiestrogen therapy (anastrozole ) from 06/20/15 - 06/19/20. Adjuvant radiation was not recommended. She is UTD w/ yearly mammograms.  She is no longer followed by Oncology.

## 2023-06-03 DIAGNOSIS — Z1211 Encounter for screening for malignant neoplasm of colon: Secondary | ICD-10-CM | POA: Diagnosis not present

## 2023-06-03 DIAGNOSIS — N952 Postmenopausal atrophic vaginitis: Secondary | ICD-10-CM | POA: Diagnosis not present

## 2023-06-03 DIAGNOSIS — Z01419 Encounter for gynecological examination (general) (routine) without abnormal findings: Secondary | ICD-10-CM | POA: Diagnosis not present

## 2023-06-03 DIAGNOSIS — M8580 Other specified disorders of bone density and structure, unspecified site: Secondary | ICD-10-CM | POA: Diagnosis not present

## 2023-06-03 DIAGNOSIS — N771 Vaginitis, vulvitis and vulvovaginitis in diseases classified elsewhere: Secondary | ICD-10-CM | POA: Diagnosis not present

## 2023-06-03 DIAGNOSIS — Z124 Encounter for screening for malignant neoplasm of cervix: Secondary | ICD-10-CM | POA: Diagnosis not present

## 2023-06-03 DIAGNOSIS — Z1339 Encounter for screening examination for other mental health and behavioral disorders: Secondary | ICD-10-CM | POA: Diagnosis not present

## 2023-06-03 DIAGNOSIS — Z1239 Encounter for other screening for malignant neoplasm of breast: Secondary | ICD-10-CM | POA: Diagnosis not present

## 2023-06-06 DIAGNOSIS — Z79899 Other long term (current) drug therapy: Secondary | ICD-10-CM | POA: Diagnosis not present

## 2023-06-06 DIAGNOSIS — M7989 Other specified soft tissue disorders: Secondary | ICD-10-CM | POA: Diagnosis not present

## 2023-06-06 DIAGNOSIS — M8580 Other specified disorders of bone density and structure, unspecified site: Secondary | ICD-10-CM | POA: Diagnosis not present

## 2023-06-06 DIAGNOSIS — M0589 Other rheumatoid arthritis with rheumatoid factor of multiple sites: Secondary | ICD-10-CM | POA: Diagnosis not present

## 2023-06-06 DIAGNOSIS — M79643 Pain in unspecified hand: Secondary | ICD-10-CM | POA: Diagnosis not present

## 2023-06-06 DIAGNOSIS — M1990 Unspecified osteoarthritis, unspecified site: Secondary | ICD-10-CM | POA: Diagnosis not present

## 2023-06-07 ENCOUNTER — Other Ambulatory Visit: Payer: Self-pay | Admitting: Internal Medicine

## 2023-06-07 DIAGNOSIS — E079 Disorder of thyroid, unspecified: Secondary | ICD-10-CM

## 2023-06-08 LAB — HM PAP SMEAR

## 2023-06-09 ENCOUNTER — Other Ambulatory Visit: Payer: Self-pay | Admitting: Internal Medicine

## 2023-06-09 DIAGNOSIS — B880 Other acariasis: Secondary | ICD-10-CM | POA: Diagnosis not present

## 2023-06-09 DIAGNOSIS — H01001 Unspecified blepharitis right upper eyelid: Secondary | ICD-10-CM | POA: Diagnosis not present

## 2023-06-09 DIAGNOSIS — H01004 Unspecified blepharitis left upper eyelid: Secondary | ICD-10-CM | POA: Diagnosis not present

## 2023-06-09 DIAGNOSIS — H524 Presbyopia: Secondary | ICD-10-CM | POA: Diagnosis not present

## 2023-06-09 DIAGNOSIS — H40023 Open angle with borderline findings, high risk, bilateral: Secondary | ICD-10-CM | POA: Diagnosis not present

## 2023-06-22 ENCOUNTER — Ambulatory Visit: Payer: Medicare PPO | Admitting: Internal Medicine

## 2023-06-22 NOTE — Progress Notes (Deleted)
I,Trayvond Viets T Deloria Lair, CMA,acting as a Neurosurgeon for [name], MD.,have documented all relevant documentation on the behalf of [name], MD,as directed by  [name], MD while in the presence of [name], MD.  Subjective:  Patient ID: Evelyn Orr , female    DOB: [birth year] , 78 y.o.   MRN: [id number]  No chief complaint on file.   HPI  Patient presents today for thyroid follow up.      Past Medical History:  Diagnosis Date   Arthritis    generalized   Breast cancer (HCC)    Breast cancer of upper-outer quadrant of right female breast (HCC) 03/20/2015   Cough    GERD (gastroesophageal reflux disease)    uses PRN meds/with certain foods   Hypothyroidism    Nasal congestion    PONV (postoperative nausea and vomiting)    Rheumatoid arthritis(714.0)    Seasonal allergies    Seasonal allergies    Thyroid disease    hypothyroidism     Family History  Problem Relation Age of Onset   Colon polyps Mother    Hypertension Mother    Heart disease Father    Heart attack Father    Other Sister        high breast density   Colon polyps Brother    Prostate cancer Brother 53       unknown Gleason   Colon polyps Brother    Prostate cancer Maternal Uncle    Prostate cancer Maternal Uncle    Prostate cancer Maternal Uncle    Breast cancer Paternal Aunt        dx. 35 or younger   Colon cancer Paternal Uncle    Prostate cancer Maternal Grandfather        dx. older age   Colon polyps Son 76       colonoscopy for unspecified reason; found some colon polyps, has not had one since   Breast cancer Cousin        4 maternal cousin had breast cancer    Colon cancer Other    Cancer Other        MGM's sister   Rectal cancer Neg Hx    Stomach cancer Neg Hx    Esophageal cancer Neg Hx      Current Outpatient Medications:    albuterol (VENTOLIN HFA) 108 (90 Base) MCG/ACT inhaler, TAKE 2 PUFFS BY MOUTH EVERY 6 HOURS AS NEEDED FOR WHEEZE OR SHORTNESS OF  BREATH, Disp: 18 each, Rfl: 1   cetirizine (ZYRTEC ALLERGY) 10 MG tablet, Take 1 tablet (10 mg total) by mouth daily., Disp: , Rfl:    Cholecalciferol 50 MCG (2000 UT) TABS, Orally, Disp: , Rfl:    diphenhydrAMINE (BENADRYL) 25 mg capsule, Take 25 mg by mouth every 6 (six) hours as needed., Disp: , Rfl:    ERGOCALCIFEROL PO, TAKE 1 CAPSULE (50000UNITS) BY ORAL ROUTE 2 TIMES EVERY WEEK TUES/FRI, Disp: , Rfl:    folic acid (FOLVITE) 1 MG tablet, 1 tablet, Disp: , Rfl:    folic acid (FOLVITE) 1 MG tablet, 1 tablet Orally Once a day for 90 days, Disp: , Rfl:    leflunomide (ARAVA) 20 MG tablet, TAKE 1 TABLET BY MOUTH EVERY DAY FOR 30 DAYS for 30, Disp: , Rfl:    levothyroxine (SYNTHROID) 88 MCG tablet, Take one tablet by mouth once daily, Disp: 90 tablet, Rfl: 0   methotrexate (RHEUMATREX) 2.5 MG tablet, Take 20 mg by mouth once  a week. 8 tablets weekly, Disp: , Rfl: 1   methotrexate (RHEUMATREX) 2.5 MG tablet, take 8 tablets once a week orally Orally once a week for 90 days, Disp: , Rfl:    methotrexate (RHEUMATREX) 2.5 MG tablet, Take 8 tablets by mouth once a week., Disp: , Rfl:    mometasone (NASONEX) 50 MCG/ACT nasal spray, Place 2 sprays into the nose daily., Disp: 17 g, Rfl: 2   montelukast (SINGULAIR) 10 MG tablet, Take 1 tablet every day by oral route., Disp: 90 tablet, Rfl: 2   montelukast (SINGULAIR) 10 MG tablet, Take 1 tablet by mouth daily., Disp: , Rfl:    nystatin cream (MYCOSTATIN), APPLY TO THE AFFECTED AREA(S) BY TOPICAL ROUTE 2 TIMES PER DAY x 14 days then prn, Disp: , Rfl:    nystatin ointment (MYCOSTATIN), APPLY TO THE AFFECTED AREA(S) BY TOPICAL ROUTE 2 TIMES PER DAY x 14 DAYS THEN AS NEEDED, Disp: , Rfl:    peg 3350 powder (MOVIPREP) 100 g SOLR, , Disp: , Rfl:    Phenylephrine-Chlorphen-DM (NOREL CS PO), TAKE 1 TEASPOONFUL BY MOUTH 3 TIMES A DAY AS NEEDED, Disp: , Rfl:    predniSONE (DELTASONE) 20 MG tablet, Take 2 tablets by mouth daily with breakfast for 5 days, Disp: 10  tablet, Rfl: 0   sulfaSALAzine (AZULFIDINE) 500 MG tablet, Take 1,000 mg by mouth 2 (two) times daily., Disp: , Rfl:    sulfaSALAzine (AZULFIDINE) 500 MG tablet, Take 2 tablets by mouth 2 (two) times daily., Disp: , Rfl:    triamcinolone cream (KENALOG) 0.1 %, Apply 1 Application topically 2 (two) times daily., Disp: 30 g, Rfl: 0   TRUXIMA 500 MG/50ML injection, as directed Intravenous, Disp: , Rfl:    No Known Allergies   Review of Systems  Constitutional: Negative.   Respiratory: Negative.    Cardiovascular: Negative.   Neurological: Negative.   Psychiatric/Behavioral: Negative.       There were no vitals filed for this visit. There is no height or weight on file to calculate BMI.  Wt Readings from Last 3 Encounters:  05/19/23 142 lb 6.4 oz (64.6 kg)  05/18/23 150 lb (68 kg)  12/13/22 150 lb (68 kg)     Objective:  Physical Exam      Assessment And Plan:  There are no diagnoses linked to this encounter.   No follow-ups on file.  Patient was given opportunity to ask questions. Patient verbalized understanding of the plan and was able to repeat key elements of the plan. All questions were answered to their satisfaction.  [name], MD  I, [name], MD, have reviewed all documentation for this visit. The documentation on 06/22/23 for the exam, diagnosis, procedures, and orders are all accurate and complete.   IF YOU HAVE BEEN REFERRED TO A SPECIALIST, IT MAY TAKE 1-2 WEEKS TO SCHEDULE/PROCESS THE REFERRAL. IF YOU HAVE NOT HEARD FROM US/SPECIALIST IN TWO WEEKS, PLEASE GIVE [place] A CALL AT [phone].   THE PATIENT IS ENCOURAGED TO PRACTICE SOCIAL DISTANCING DUE TO THE COVID-19 PANDEMIC.

## 2023-06-28 ENCOUNTER — Other Ambulatory Visit (INDEPENDENT_AMBULATORY_CARE_PROVIDER_SITE_OTHER): Payer: Self-pay

## 2023-06-28 DIAGNOSIS — D649 Anemia, unspecified: Secondary | ICD-10-CM

## 2023-06-28 LAB — HEMOCCULT GUIAC POC 1CARD (OFFICE)
Card #2 Fecal Occult Blod, POC: POSITIVE
Card #3 Fecal Occult Blood, POC: POSITIVE
Fecal Occult Blood, POC: POSITIVE — AB

## 2023-06-30 ENCOUNTER — Other Ambulatory Visit: Payer: Self-pay | Admitting: Internal Medicine

## 2023-06-30 DIAGNOSIS — Z860109 Personal history of other colon polyps: Secondary | ICD-10-CM

## 2023-06-30 DIAGNOSIS — D649 Anemia, unspecified: Secondary | ICD-10-CM

## 2023-07-12 ENCOUNTER — Other Ambulatory Visit: Payer: Self-pay | Admitting: Internal Medicine

## 2023-07-12 DIAGNOSIS — D649 Anemia, unspecified: Secondary | ICD-10-CM

## 2023-07-12 DIAGNOSIS — R195 Other fecal abnormalities: Secondary | ICD-10-CM

## 2023-07-20 ENCOUNTER — Ambulatory Visit: Payer: Medicare PPO

## 2023-07-21 ENCOUNTER — Other Ambulatory Visit: Payer: Self-pay | Admitting: Internal Medicine

## 2023-07-25 ENCOUNTER — Ambulatory Visit (INDEPENDENT_AMBULATORY_CARE_PROVIDER_SITE_OTHER): Payer: Medicare PPO

## 2023-07-25 ENCOUNTER — Ambulatory Visit (INDEPENDENT_AMBULATORY_CARE_PROVIDER_SITE_OTHER): Payer: Medicare PPO | Admitting: Podiatry

## 2023-07-25 DIAGNOSIS — M2012 Hallux valgus (acquired), left foot: Secondary | ICD-10-CM

## 2023-07-25 DIAGNOSIS — M19072 Primary osteoarthritis, left ankle and foot: Secondary | ICD-10-CM

## 2023-07-25 DIAGNOSIS — M21612 Bunion of left foot: Secondary | ICD-10-CM

## 2023-07-25 NOTE — Progress Notes (Signed)
  Subjective:  Patient ID: Evelyn Orr, female    DOB: March 08, 1946,  MRN: [id number]  Chief Complaint  Patient presents with   Bunions    Pt stated that her left big toe just started hurting a few weeks ago no injuries     78 y.o. female presents with the above complaint. History confirmed with patient.  Has started hurting in shoes  Objective:  Physical Exam: warm, good capillary refill, no trophic changes or ulcerative lesions, normal DP and PT pulses, normal sensory exam, and hallux valgus deformity with large dorsal medial bunion tenderness here, there is some crepitus with range of motion   Radiographs: Multiple views x-ray of the right foot: no fracture, dislocation, swelling or degenerative changes noted, hallux valgus deformity, and arthritic changes in the lateral portion of the MTP and sesamoid complex Assessment:   1. Arthritis of first metatarsophalangeal (MTP) joint of left foot   2. Hallux valgus with bunions, left      Plan:  Patient was evaluated and treated and all questions answered.  We reviewed her x-rays discussed the etiology and treatment options of bunion deformity including offloading with silicone padding and shoe gear changes.  A silicone pad was dispensed.  She will trial this.  We also discussed arthritis is causing some pain and corticosteroid injection could improve pain relief.  She has plans with her rheumatologist to begin an infusion soon and this may offer increased relief as well.  Long-term we also discussed that first MTP arthrodesis would give long-term correction.  She will follow with me as needed if it worsens.  Return if symptoms worsen or fail to improve.

## 2023-07-26 ENCOUNTER — Other Ambulatory Visit: Payer: Self-pay | Admitting: Internal Medicine

## 2023-07-26 DIAGNOSIS — M1712 Unilateral primary osteoarthritis, left knee: Secondary | ICD-10-CM | POA: Diagnosis not present

## 2023-07-28 ENCOUNTER — Other Ambulatory Visit: Payer: Self-pay

## 2023-07-28 ENCOUNTER — Telehealth: Payer: Self-pay

## 2023-07-28 MED ORDER — ACCRUFER 30 MG PO CAPS: 1.0000 | ORAL_CAPSULE | Freq: Every day | ORAL | 1 refills | Status: DC

## 2023-07-28 NOTE — Telephone Encounter (Signed)
 Reason for CRM: patient called to see if the nurse could f/u with the gastroenterologist to find out if she can go see them and have the colonoscopy before Novembers. Please f/u with patient   I returned pt call and left her vm. She can not have her colonoscopy before Nov. Because it is not due before then insurance may not pay for it if she does it sooner. YL,RMA

## 2023-07-29 ENCOUNTER — Ambulatory Visit: Payer: Medicare PPO

## 2023-07-29 DIAGNOSIS — Z0000 Encounter for general adult medical examination without abnormal findings: Secondary | ICD-10-CM | POA: Diagnosis not present

## 2023-07-29 NOTE — Progress Notes (Signed)
 Subjective:   Evelyn Orr is a 78 y.o. female who presents for Medicare Annual (Subsequent) preventive examination.  Visit Complete: Virtual I connected with  [name] on 07/29/23 by a audio enabled telemedicine application and verified that I am speaking with the correct person using two identifiers.  Patient Location: Home  Provider Location: Office/Clinic  I discussed the limitations of evaluation and management by telemedicine. The patient expressed understanding and agreed to proceed.  Vital Signs: Because this visit was a virtual/telehealth visit, some criteria may be missing or patient reported. Any vitals not documented were not able to be obtained and vitals that have been documented are patient reported.  Patient Medicare AWV questionnaire was completed by the patient on 07/29/2023; I have confirmed that all information answered by patient is correct and no changes since this date.  Cardiac Risk Factors include: none     Objective:    Today's Vitals   There is no height or weight on file to calculate BMI.     06/03/2022    2:00 PM 05/14/2021    9:06 AM 03/05/2021    6:00 PM 03/03/2021    3:37 PM 02/03/2021    9:14 AM 04/23/2020    9:11 AM 12/10/2019    8:30 AM  Advanced Directives  Does Patient Have a Medical Advance Directive? No No No No No No No  Would patient like information on creating a medical advance directive?  No - Patient declined No - Patient declined   No - Patient declined     Current Medications (verified) Outpatient Encounter Medications as of 07/29/2023  Medication Sig   albuterol (VENTOLIN HFA) 108 (90 Base) MCG/ACT inhaler TAKE 2 PUFFS BY MOUTH EVERY 6 HOURS AS NEEDED FOR WHEEZE OR SHORTNESS OF BREATH   cetirizine (ZYRTEC ALLERGY) 10 MG tablet Take 1 tablet (10 mg total) by mouth daily.   Cholecalciferol 50 MCG (2000 UT) TABS Orally   diphenhydrAMINE (BENADRYL) 25 mg capsule Take 25 mg by mouth every 6 (six) hours as needed.    ERGOCALCIFEROL PO TAKE 1 CAPSULE (50000UNITS) BY ORAL ROUTE 2 TIMES EVERY WEEK TUES/FRI   Ferric Maltol (ACCRUFER) 30 MG CAPS Take 1 capsule (30 mg total) by mouth daily.   folic acid (FOLVITE) 1 MG tablet 1 tablet   folic acid (FOLVITE) 1 MG tablet 1 tablet Orally Once a day for 90 days   leflunomide (ARAVA) 20 MG tablet TAKE 1 TABLET BY MOUTH EVERY DAY FOR 30 DAYS for 30   levothyroxine (SYNTHROID) 88 MCG tablet Take one tablet by mouth once daily   methotrexate (RHEUMATREX) 2.5 MG tablet Take 20 mg by mouth once a week. 8 tablets weekly   methotrexate (RHEUMATREX) 2.5 MG tablet take 8 tablets once a week orally Orally once a week for 90 days   methotrexate (RHEUMATREX) 2.5 MG tablet Take 8 tablets by mouth once a week.   mometasone (NASONEX) 50 MCG/ACT nasal spray Place 2 sprays into the nose daily.   montelukast (SINGULAIR) 10 MG tablet Take 1 tablet by mouth daily.   montelukast (SINGULAIR) 10 MG tablet TAKE 1 TABLET BY MOUTH EVERY DAY   nystatin cream (MYCOSTATIN) APPLY TO THE AFFECTED AREA(S) BY TOPICAL ROUTE 2 TIMES PER DAY x 14 days then prn   nystatin ointment (MYCOSTATIN) APPLY TO THE AFFECTED AREA(S) BY TOPICAL ROUTE 2 TIMES PER DAY x 14 DAYS THEN AS NEEDED   peg 3350 powder (MOVIPREP) 100 g SOLR    Phenylephrine-Chlorphen-DM (NOREL CS  PO) TAKE 1 TEASPOONFUL BY MOUTH 3 TIMES A DAY AS NEEDED   predniSONE (DELTASONE) 20 MG tablet Take 2 tablets by mouth daily with breakfast for 5 days   sulfaSALAzine (AZULFIDINE) 500 MG tablet Take 1,000 mg by mouth 2 (two) times daily.   sulfaSALAzine (AZULFIDINE) 500 MG tablet Take 2 tablets by mouth 2 (two) times daily.   triamcinolone cream (KENALOG) 0.1 % Apply 1 Application topically 2 (two) times daily.   TRUXIMA 500 MG/50ML injection as directed Intravenous   No facility-administered encounter medications on file as of 07/29/2023.    Allergies (verified) Patient has no known allergies.   History: Past Medical History:  Diagnosis Date    Arthritis    generalized   Breast cancer (HCC)    Breast cancer of upper-outer quadrant of right female breast (HCC) 03/20/2015   Cough    GERD (gastroesophageal reflux disease)    uses PRN meds/with certain foods   Hypothyroidism    Nasal congestion    PONV (postoperative nausea and vomiting)    Rheumatoid arthritis(714.0)    Seasonal allergies    Seasonal allergies    Thyroid disease    hypothyroidism   Past Surgical History:  Procedure Laterality Date   BREAST LUMPECTOMY WITH RADIOACTIVE SEED LOCALIZATION Right 04/09/2015   Procedure: BREAST LUMPECTOMY WITH RADIOACTIVE SEED LOCALIZATION;  Surgeon: Emelia Loron, MD;  Location: [facility];  Service: General;  Laterality: Right;   BREAST REDUCTION SURGERY  2001   CALCANEAL OSTEOTOMY Left 10/11/2013   Procedure: LEFT CALCANEAL OSTEOTOMY;  Surgeon: Toni Arthurs, MD;  Location: [facility];  Service: Orthopedics;  Laterality: Left;   CARPAL TUNNEL RELEASE     left   CERVICAL FUSION  2009   COLONOSCOPY     2002,2005,2008 w/Brodie   COLONOSCOPY  2015   Brodie-MAC-prep good-TA- recall 5 yrs   GASTROCNEMIUS RECESSION Left 10/11/2013   Procedure: LEFT GASTROC RECESSION; LEFT POSTERIOR TIBIAL TENOLYSIS;  Surgeon: Toni Arthurs, MD;  Location: [facility];  Service: Orthopedics;  Laterality: Left;   KNEE ARTHROSCOPY  2004   right   POLYPECTOMY     REMOVAL OF IMPLANT Left 02/14/2014   Procedure: LEFT FOOT REMOVAL OF DEEP IMPLANT;  Surgeon: Toni Arthurs, MD;  Location: [facility];  Service: Orthopedics;  Laterality: Left;   REVERSE SHOULDER ARTHROPLASTY Right 03/05/2021   Procedure: REVERSE SHOULDER ARTHROPLASTY;  Surgeon: Jones Broom, MD;  Location: [facility];  Service: Orthopedics;  Laterality: Right;   SHOULDER ARTHROSCOPY W/ ROTATOR CUFF REPAIR  2006   right   THYROIDECTOMY     age 89   TONSILLECTOMY     TOTAL KNEE ARTHROPLASTY Right 06/21/2016   Procedure: RIGHT TOTAL  KNEE ARTHROPLASTY;  Surgeon: Durene Romans, MD;  Location: [facility];  Service: Orthopedics;  Laterality: Right;   TUBAL LIGATION     WRIST ARTHRODESIS  2003   left    Family History  Problem Relation Age of Onset   Colon polyps Mother    Hypertension Mother    Heart disease Father    Heart attack Father    Other Sister        high breast density   Colon polyps Brother    Prostate cancer Brother 13       unknown Gleason   Colon polyps Brother    Prostate cancer Maternal Uncle    Prostate cancer Maternal Uncle    Prostate cancer Maternal Uncle    Breast cancer Paternal Aunt  dx. 62 or younger   Colon cancer Paternal Uncle    Prostate cancer Maternal Grandfather        dx. older age   Colon polyps Son 59       colonoscopy for unspecified reason; found some colon polyps, has not had one since   Breast cancer Cousin        4 maternal cousin had breast cancer    Colon cancer Other    Cancer Other        MGM's sister   Rectal cancer Neg Hx    Stomach cancer Neg Hx    Esophageal cancer Neg Hx    Social History   Socioeconomic History   Marital status: Married    Spouse name: Not on file   Number of children: Not on file   Years of education: Not on file   Highest education level: Not on file  Occupational History   Occupation: retired  Tobacco Use   Smoking status: Former    Current packs/day: 0.00    Average packs/day: 0.3 packs/day for 21.0 years (5.3 ttl pk-yrs)    Types: Cigarettes    Start date: 02/13/1963    Quit date: 02/13/1984    Years since quitting: 39.4   Smokeless tobacco: Never  Vaping Use   Vaping status: Never Used  Substance and Sexual Activity   Alcohol use: No   Drug use: No   Sexual activity: Not Currently    Birth control/protection: Post-menopausal  Other Topics Concern   Not on file  Social History Narrative   Not on file   Social Drivers of Health   Financial Resource Strain: Low Risk  (07/29/2023)   Overall Financial Resource  Strain (CARDIA)    Difficulty of Paying Living Expenses: Not hard at all  Food Insecurity: No Food Insecurity (07/29/2023)   Hunger Vital Sign    Worried About Running Out of Food in the Last Year: Never true    Ran Out of Food in the Last Year: Never true  Transportation Needs: No Transportation Needs (06/03/2022)   PRAPARE - Administrator, Civil Service (Medical): No    Lack of Transportation (Non-Medical): No  Physical Activity: Sufficiently Active (07/29/2023)   Exercise Vital Sign    Days of Exercise per Week: 4 days    Minutes of Exercise per Session: 60 min  Stress: No Stress Concern Present (07/29/2023)   [employer] of Occupational Health - Occupational Stress Questionnaire    Feeling of Stress : Not at all  Social Connections: Socially Integrated (07/29/2023)   Social Connection and Isolation Panel [NHANES]    Frequency of Communication with Friends and Family: More than three times a week    Frequency of Social Gatherings with Friends and Family: More than three times a week    Attends Religious Services: More than 4 times per year    Active Member of [employer] or Organizations: No    Attends Engineer, structural: More than 4 times per year    Marital Status: Married    Tobacco Counseling Counseling given: Yes   Clinical Intake:  Pre-visit preparation completed: Yes  Pain : No/denies pain     Nutritional Risks: None Diabetes: No  How often do you need to have someone help you when you read instructions, pamphlets, or other written materials from your doctor or pharmacy?: 1 - Never What is the last grade level you completed in school?: 12grade  Interpreter Needed?: No  Information  entered by :: [place]   Activities of Daily Living    07/29/2023    9:04 AM  In your present state of health, do you have any difficulty performing the following activities:  Hearing? 0  Vision? 0  Difficulty concentrating or making decisions? 0  Walking  or climbing stairs? 0  Dressing or bathing? 0  Doing errands, shopping? 0  Preparing Food and eating ? N  Using the Toilet? N  In the past six months, have you accidently leaked urine? N  Do you have problems with loss of bowel control? N  Managing your Medications? N  Managing your Finances? N  Housekeeping or managing your Housekeeping? N    Patient Care Team: Dorothyann Peng, MD as PCP - General (Internal Medicine) Henreitta Leber, PA-C as Physician Assistant (Obstetrics and Gynecology) Emelia Loron, MD as Consulting Physician (General Surgery) Malachy Mood, MD as Consulting Physician (Hematology) Lurline Hare, MD as Consulting Physician (Radiation Oncology) Pershing Proud, RN as Registered Nurse Donnelly Angelica, RN as Registered Nurse Damita Lack Marcy Salvo, NP as Nurse Practitioner (Hematology and Oncology)  Indicate any recent Medical Services you may have received from other than [facility] providers in the past year (date may be approximate).     Assessment:   This is a routine wellness examination for Willy.  Hearing/Vision screen No results found.   Goals Addressed               This Visit's Progress     Stay Healthy (pt-stated)        Eat the right foods, walk.       Depression Screen    07/29/2023    9:10 AM 05/19/2023    2:08 PM 06/03/2022    2:00 PM 05/14/2021    9:08 AM 05/07/2021    9:19 AM 04/23/2020    9:12 AM 09/04/2019    2:08 PM  PHQ 2/9 Scores  PHQ - 2 Score 0 0 0 0 0 0 0  PHQ- 9 Score  0         Fall Risk    05/19/2023    2:08 PM 06/03/2022    2:00 PM 05/14/2021    9:08 AM 05/07/2021    9:19 AM 04/23/2020    9:12 AM  Fall Risk   Falls in the past year? 0 0 0 0 0  Number falls in past yr: 0 0  0   Injury with Fall? 0 0  0   Risk for fall due to : No Fall Risks Medication side effect No Fall Risks  Medication side effect  Follow up Falls evaluation completed Falls prevention discussed;Education provided;Falls evaluation completed  Falls evaluation completed;Education provided;Falls prevention discussed  Falls evaluation completed;Education provided;Falls prevention discussed    MEDICARE RISK AT HOME:    TIMED UP AND GO:  Was the test performed?  No    Cognitive Function:        07/29/2023    9:10 AM 06/03/2022    2:01 PM 05/14/2021    9:08 AM 04/23/2020    9:13 AM 04/10/2019   12:23 PM  6CIT Screen  What Year? 0 points 0 points 0 points 0 points 0 points  What month? 0 points 0 points 0 points 0 points 0 points  What time? 0 points 0 points 0 points 0 points 0 points  Count back from 20 0 points 0 points 0 points 0 points 0 points  Months in reverse 0 points 0 points 0 points  0 points 0 points  Repeat phrase 0 points 4 points 4 points 2 points 2 points  Total Score 0 points 4 points 4 points 2 points 2 points    Immunizations Immunization History  Administered Date(s) Administered   PFIZER(Purple Top)SARS-COV-2 Vaccination 01/30/2019, 07/06/2019, 07/27/2019, 01/30/2020   Tdap 01/10/2009    TDAP status: Up to date  Flu Vaccine status: Up to date  Pneumococcal vaccine status: Up to date  Covid-19 vaccine status: Completed vaccines  Qualifies for Shingles Vaccine? Yes   Zostavax completed Yes   Shingrix Completed?: Yes  Screening Tests Health Maintenance  Topic Date Due   COVID-19 Vaccine (5 - 2024-25 season) 08/14/2023 (Originally 01/30/2023)   Zoster Vaccines- Shingrix (1 of 2) 08/17/2023 (Originally 03/01/1965)   INFLUENZA VACCINE  08/29/2023 (Originally 12/30/2022)   DTaP/Tdap/Td (2 - Td or Tdap) 05/18/2024 (Originally 01/11/2019)   Pneumonia Vaccine 23+ Years old (1 of 1 - PCV) 05/18/2024 (Originally 02/28/2013)   Colonoscopy  04/16/2024   MAMMOGRAM  05/05/2024   Medicare Annual Wellness (AWV)  07/28/2024   DEXA SCAN  Completed   Hepatitis C Screening  Completed   HPV VACCINES  Aged Out    Health Maintenance  There are no preventive care reminders to display for this  patient.   Colorectal cancer screening: Type of screening: Colonoscopy. Completed 04/16/2021. Repeat every 3 years  Mammogram status: Completed 05/06/2023. Repeat every year  Bone Density status: Completed 05/06/2023. Results reflect: Bone density results: OSTEOPENIA. Repeat every 3 years.  Additional Screening:  Hepatitis C Screening: does qualify; Completed 03/19/2019  Dental Screening: Recommended annual dental exams for proper oral hygiene   Community Resource Referral / Chronic Care Management: CRR required this visit?  No   CCM required this visit?  No     Plan:     I have personally reviewed and noted the following in the patient's chart:   Medical and social history Use of alcohol, tobacco or illicit drugs  Current medications and supplements including opioid prescriptions. Patient is not currently taking opioid prescriptions. Functional ability and status Nutritional status Physical activity Advanced directives List of other physicians Hospitalizations, surgeries, and ER visits in previous 12 months Vitals Screenings to include cognitive, depression, and falls Referrals and appointments  In addition, I have reviewed and discussed with patient certain preventive protocols, quality metrics, and best practice recommendations. A written personalized care plan for preventive services as well as general preventive health recommendations were provided to patient.     Coolidge Breeze, [place]   07/29/2023   After Visit Summary: (MyChart) Due to this being a telephonic visit, the after visit summary with patients personalized plan was offered to patient via MyChart   Nurse Notes: AWV Completed.

## 2023-07-29 NOTE — Patient Instructions (Signed)

## 2023-08-02 ENCOUNTER — Other Ambulatory Visit: Payer: Self-pay

## 2023-08-02 DIAGNOSIS — E079 Disorder of thyroid, unspecified: Secondary | ICD-10-CM | POA: Diagnosis not present

## 2023-08-03 LAB — TSH+FREE T4
Free T4: 1.2 ng/dL (ref 0.82–1.77)
TSH: 2.45 u[IU]/mL (ref 0.450–4.500)

## 2023-08-15 DIAGNOSIS — M25562 Pain in left knee: Secondary | ICD-10-CM | POA: Diagnosis not present

## 2023-08-16 ENCOUNTER — Ambulatory Visit (INDEPENDENT_AMBULATORY_CARE_PROVIDER_SITE_OTHER): Payer: Medicare PPO | Admitting: Podiatry

## 2023-08-16 DIAGNOSIS — Z91199 Patient's noncompliance with other medical treatment and regimen due to unspecified reason: Secondary | ICD-10-CM

## 2023-08-16 NOTE — Progress Notes (Signed)
 1. No-show for appointment

## 2023-08-31 ENCOUNTER — Ambulatory Visit (INDEPENDENT_AMBULATORY_CARE_PROVIDER_SITE_OTHER): Payer: Self-pay | Admitting: Internal Medicine

## 2023-08-31 ENCOUNTER — Encounter: Payer: Self-pay | Admitting: Internal Medicine

## 2023-08-31 VITALS — BP 124/82 | HR 87 | Temp 97.7°F | Ht 61.0 in | Wt 140.2 lb

## 2023-08-31 DIAGNOSIS — Z79899 Other long term (current) drug therapy: Secondary | ICD-10-CM

## 2023-08-31 DIAGNOSIS — D649 Anemia, unspecified: Secondary | ICD-10-CM | POA: Diagnosis not present

## 2023-08-31 DIAGNOSIS — Z87448 Personal history of other diseases of urinary system: Secondary | ICD-10-CM | POA: Diagnosis not present

## 2023-08-31 DIAGNOSIS — R195 Other fecal abnormalities: Secondary | ICD-10-CM

## 2023-08-31 DIAGNOSIS — R634 Abnormal weight loss: Secondary | ICD-10-CM | POA: Diagnosis not present

## 2023-08-31 DIAGNOSIS — Z6829 Body mass index (BMI) 29.0-29.9, adult: Secondary | ICD-10-CM

## 2023-08-31 DIAGNOSIS — M069 Rheumatoid arthritis, unspecified: Secondary | ICD-10-CM | POA: Diagnosis not present

## 2023-08-31 NOTE — Patient Instructions (Signed)
Rheumatoid Arthritis Rheumatoid arthritis (RA) is a long-term (chronic) disease that causes inflammation in the joints. RA may start slowly. It most often affects the small joints of the hands and feet. Usually, the same joints are affected on both sides of the body. Inflammation from RA can also affect other parts of the body, including the heart, eyes, or lungs. There is no cure for RA, but medicines can help your symptoms and stop or slow down the progression of the disease. What are the causes? RA is an autoimmune disease. When you have an autoimmune disease, your body's defense system (immune system) mistakenly attacks healthy body tissues. The exact cause of RA is not known. What increases the risk? The following factors may make you more likely to develop this condition: Being female. Having a family history of RA or other autoimmune diseases. Having a history of smoking. Being obese. Having been exposed to pollutants or chemicals. What are the signs or symptoms? Symptoms of this condition usually start gradually. They are often worse in the morning. The first symptom may be morning stiffness that lasts longer than 30 minutes. As RA progresses, symptoms may include: Pain, stiffness, swelling, warmth, and tenderness in joints on both sides of your body. Loss of energy. Loss of appetite. Weight loss. Low-grade fever. Dry eyes and dry mouth. Firm lumps (rheumatoid nodules) that grow beneath your skin in areas such as your forearm bones near your elbows and on your hands. Changes in the appearance of joints (deformity) and loss of joint function. Symptoms of this condition vary from person to person. Symptoms of RA often come and go. Sometimes, symptoms get worse for a period of time. These are called flares. How is this diagnosed? This condition is diagnosed based on your symptoms, medical history, and a physical exam. You may have X-rays or an MRI to check for the type of joint  changes that are caused by RA. You may also have blood tests to look for: Proteins (antibodies) that your immune system may make if you have RA. These include rheumatoid factor (RF) and anti-CCP. When blood tests show these proteins, you are said to have "seropositive RA." When blood tests do not show these proteins, you may have "seronegative RA." Inflammation in your blood. A low number of red blood cells (anemia). How is this treated? The goals of treatment are to relieve pain, reduce inflammation, and slow down or stop joint damage and disability. Treatment may include: Lifestyle changes. It is important to rest as needed, eat a healthy diet, and exercise. Medicines. Your health care provider may adjust your medicines every 3 months until treatment goals are reached. Common medicines include: Pain relievers (analgesics). Corticosteroids and NSAIDs, such as ibuprofen, to reduce inflammation. Disease-modifying antirheumatic drugs (DMARDs) to try to slow the course of the disease. Biologic response modifiers to reduce inflammation and damage. Physical therapy and occupational therapy. Surgery, if you have severe joint damage. Joint replacement or fusing of joints may be needed. Your health care provider will work with you to identify the best treatment option for you based on assessment of the overall disease activity in your body. Follow these instructions at home: Managing pain, stiffness, and swelling If directed, apply heat to the affected area as often as told by your health care provider. Use the heat source that your health care provider recommends, such as a moist heat pack or a heating pad. Place a towel between your skin and the heat source. Leave the heat on for  20-30 minutes. Remove the heat if your skin turns bright red. This is especially important if you are unable to feel pain, heat, or cold. You have a greater risk of getting burned.  Activity Return to your normal  activities as told by your health care provider. Ask your health care provider what activities are safe for you. Rest when you are having a flare. Start an exercise program as told by your health care provider. This may include physical therapy exercises to maintain movement and strength in your joints. General instructions Take over-the-counter and prescription medicines only as told by your health care provider. Keep all follow-up visits. This is important. Where to find more information [employer] Rheumatology: rheumatology.org Arthritis Foundation: arthritis.org Contact a health care provider if: You have a flare-up of RA symptoms. You have a fever. You have side effects from your medicines. Get help right away if: You have chest pain. You have trouble breathing. You quickly develop a hot, painful joint that is more severe than your usual joint aches. These symptoms may be an emergency. Get help right away. Call 911. Do not wait to see if the symptoms will go away. Do not drive yourself to the hospital. Summary Rheumatoid arthritis (RA) is a long-term (chronic) disease that causes inflammation in the joints. RA is an autoimmune disease. The goals of treatment are to relieve pain, reduce inflammation, and slow down or stop joint damage and disability. This information is not intended to replace advice given to you by your health care provider. Make sure you discuss any questions you have with your health care provider. Document Revised: 03/19/2021 Document Reviewed: 03/19/2021 Elsevier Patient Education  2024 [employer].

## 2023-08-31 NOTE — Progress Notes (Signed)
 I,Victoria T Basil Lim, CMA,acting as a Neurosurgeon for Smiley Dung, MD.,have documented all relevant documentation on the behalf of Smiley Dung, MD,as directed by  Smiley Dung, MD while in the presence of Smiley Dung, MD.  Subjective:  Patient ID: Evelyn Orr  , female    DOB: Jul 14, 1945 , 78 y.o.   MRN: [id number]  Chief Complaint  Patient presents with   Weight Check    Patient presents today for weight loss concern. She reports initially noticing this a month ago. Comparing her recent appointment weights. She has only lost 2 pounds since December. She states noticing a change in the fit of her clothing. No change in her appetite. She eats breakfast & dinner daily.      Discussed the use of AI scribe software for clinical note transcription with the patient, who gave verbal consent to proceed.  History of Present Illness Evelyn Orr  is a 78 year old female who presents with weight loss and blood in stool.  She submitted stool cards which were guiaic positive. She has been referred to GI, she finally has an upcoming appt.  She has an upcoming appointment on the seventeenth to discuss scheduling the procedure.  She notes a weight loss of approximately 16 pounds over the past year, with her weight decreasing from 156 pounds in February of last year to 140 pounds recently. Her appetite has not changed, and she continues to eat two meals a day without snacking. She also admits that she is now exercising on a regular basis.  She goes to the [facility] four days a week, which includes using two or three machines and walking a mile each session, she has gone down two clothing sizes.  She is currently taking Synthroid 88 mcg daily and Accrufer, which she tolerates well. Her thyroid function was checked earlier this month and was reported as normal. She has not been using MyChart to check her lab results but acknowledges receiving information through it.  Her family history is notable  for an uncle with colon cancer, but no parental history of the disease. No changes in appetite, and no tingling in her hands or feet. Her extremities used to feel cold but have improved since starting a new medication.   Past Medical History:  Diagnosis Date   Arthritis    generalized   Breast cancer (HCC)    Breast cancer of upper-outer quadrant of right female breast (HCC) 03/20/2015   Cough    GERD (gastroesophageal reflux disease)    uses PRN meds/with certain foods   Hypothyroidism    Nasal congestion    PONV (postoperative nausea and vomiting)    Rheumatoid arthritis(714.0)    Seasonal allergies    Seasonal allergies    Thyroid disease    hypothyroidism     Family History  Problem Relation Age of Onset   Colon polyps Mother    Hypertension Mother    Heart disease Father    Heart attack Father    Other Sister        high breast density   Colon polyps Brother    Prostate cancer Brother 4       unknown Gleason   Colon polyps Brother    Prostate cancer Maternal Uncle    Prostate cancer Maternal Uncle    Prostate cancer Maternal Uncle    Breast cancer Paternal Aunt        dx. 35 or younger   Colon cancer Paternal Uncle  Prostate cancer Maternal Grandfather        dx. older age   Colon polyps Son 14       colonoscopy for unspecified reason; found some colon polyps, has not had one since   Breast cancer Cousin        4 maternal cousin had breast cancer    Colon cancer Other    Cancer Other        MGM's sister   Rectal cancer Neg Hx    Stomach cancer Neg Hx    Esophageal cancer Neg Hx      Current Outpatient Medications:    albuterol (VENTOLIN HFA) 108 (90 Base) MCG/ACT inhaler, TAKE 2 PUFFS BY MOUTH EVERY 6 HOURS AS NEEDED FOR WHEEZE OR SHORTNESS OF BREATH, Disp: 18 each, Rfl: 1   cetirizine (ZYRTEC ALLERGY) 10 MG tablet, Take 1 tablet (10 mg total) by mouth daily., Disp: , Rfl:    ERGOCALCIFEROL PO, TAKE 1 CAPSULE (50000UNITS) BY ORAL ROUTE 2 TIMES EVERY  WEEK TUES/FRI, Disp: , Rfl:    Ferric Maltol (ACCRUFER) 30 MG CAPS, Take 1 capsule (30 mg total) by mouth daily., Disp: 90 capsule, Rfl: 1   folic acid (FOLVITE) 1 MG tablet, 1 tablet, Disp: , Rfl:    folic acid (FOLVITE) 1 MG tablet, 1 tablet Orally Once a day for 90 days, Disp: , Rfl:    leflunomide (ARAVA) 20 MG tablet, TAKE 1 TABLET BY MOUTH EVERY DAY FOR 30 DAYS for 30, Disp: , Rfl:    levothyroxine (SYNTHROID) 88 MCG tablet, Take one tablet by mouth once daily, Disp: 90 tablet, Rfl: 0   methotrexate (RHEUMATREX) 2.5 MG tablet, Take 8 tablets by mouth once a week., Disp: , Rfl:    mometasone (NASONEX) 50 MCG/ACT nasal spray, Place 2 sprays into the nose daily., Disp: 17 g, Rfl: 2   montelukast (SINGULAIR) 10 MG tablet, Take 1 tablet by mouth daily., Disp: , Rfl:    montelukast (SINGULAIR) 10 MG tablet, TAKE 1 TABLET BY MOUTH EVERY DAY, Disp: 90 tablet, Rfl: 2   nystatin ointment (MYCOSTATIN), APPLY TO THE AFFECTED AREA(S) BY TOPICAL ROUTE 2 TIMES PER DAY x 14 DAYS THEN AS NEEDED, Disp: , Rfl:    peg 3350 powder (MOVIPREP) 100 g SOLR, , Disp: , Rfl:    predniSONE (DELTASONE) 20 MG tablet, Take 2 tablets by mouth daily with breakfast for 5 days (Patient not taking: Reported on 09/05/2023), Disp: 10 tablet, Rfl: 0   sulfaSALAzine (AZULFIDINE) 500 MG tablet, Take 1,000 mg by mouth 2 (two) times daily., Disp: , Rfl:    triamcinolone cream (KENALOG) 0.1 %, Apply 1 Application topically 2 (two) times daily., Disp: 30 g, Rfl: 0   TRUXIMA 500 MG/50ML injection, as directed Intravenous (Patient not taking: Reported on 09/05/2023), Disp: , Rfl:    No Known Allergies   Review of Systems  Constitutional:  Positive for unexpected weight change.  Respiratory: Negative.    Cardiovascular: Negative.   Gastrointestinal:  Positive for blood in stool.  Neurological: Negative.   Psychiatric/Behavioral: Negative.       Today's Vitals   08/31/23 1050  BP: 124/82  Pulse: 87  Temp: 97.7 F (36.5 C)  SpO2:  98%  Weight: 140 lb 3.2 oz (63.6 kg)  Height: 5\' 1"  (1.549 m)   Body mass index is 26.49 kg/m.  Wt Readings from Last 3 Encounters:  09/06/23 140 lb (63.5 kg)  08/31/23 140 lb 3.2 oz (63.6 kg)  05/19/23 142 lb 6.4  oz (64.6 kg)     Objective:  Physical Exam Vitals and nursing note reviewed.  Constitutional:      Appearance: Normal appearance.  HENT:     Head: Normocephalic and atraumatic.  Eyes:     Extraocular Movements: Extraocular movements intact.  Cardiovascular:     Rate and Rhythm: Normal rate and regular rhythm.     Heart sounds: Normal heart sounds.  Pulmonary:     Effort: Pulmonary effort is normal.     Breath sounds: Normal breath sounds.  Musculoskeletal:     Cervical back: Normal range of motion.  Skin:    General: Skin is warm.  Neurological:     General: No focal deficit present.     Mental Status: She is alert.  Psychiatric:        Mood and Affect: Mood normal.        Behavior: Behavior normal.         Assessment And Plan:  Unintentional weight loss Assessment & Plan: Weight loss of 16 pounds over the past year with normal thyroid function. Possible increased physical activity or gastrointestinal cause due to blood in stool.  - Order blood work for blood count, protein levels, liver, and kidney function. - Send urine for analysis; if blood present, order renal ultrasound. - Ensure colonoscopy is scheduled post-GI appointment on the 17th. - it is possible that her regular exercise routine has contributed to her weight loss as well.    Orders: -     CBC -     Prealbumin -     CMP14+EGFR  Guaiac positive stools Assessment & Plan: Blood in stool suggests possible gastrointestinal issue, awaiting colonoscopy for further investigation. - Ensure colonoscopy is scheduled post-GI appointment on the 17th. - Advise her to express urgency for colonoscopy during GI appointment.   Anemia, unspecified type Assessment & Plan: I will check levels as  below. She has guaiac positive stools. She has upcoming GI appt, colonoscopy and possible EGD will likely be performed.   Orders: -     CBC -     Iron, TIBC and Ferritin Panel -     Vitamin B12  Rheumatoid arthritis involving both feet, unspecified whether rheumatoid factor present [facility]) Assessment & Plan: Difficulty obtaining approval for infusion therapy despite previous benefit. Inquiry about rheumatologist's support for Trixema infusion therapy. - Recheck with rheumatologist regarding criteria for infusion therapy approval. - Patient advised that Rheum may need to do PA for her infusion therapy.  - Will refer her to my [facility] pharmacist to see if the approval process can be expedited.  - She is currently on DMARD therapy.   Orders: -     AMB Referral [facility] Care Management  History of hematuria -     Urinalysis, Complete  Drug therapy -     Vitamin B12  Other orders -     Microscopic Examination   Return if symptoms worsen or fail to improve.  Patient was given opportunity to ask questions. Patient verbalized understanding of the plan and was able to repeat key elements of the plan. All questions were answered to their satisfaction.    I, Smiley Dung, MD, have reviewed all documentation for this visit. The documentation on 08/31/23 for the exam, diagnosis, procedures, and orders are all accurate and complete.   IF YOU HAVE BEEN REFERRED TO A SPECIALIST, IT MAY TAKE 1-2 WEEKS TO SCHEDULE/PROCESS THE REFERRAL. IF YOU HAVE NOT HEARD FROM US /SPECIALIST IN TWO WEEKS, PLEASE GIVE  US  A CALL AT [phone] X [phone].   THE PATIENT IS ENCOURAGED TO PRACTICE SOCIAL DISTANCING DUE TO THE COVID-19 PANDEMIC.

## 2023-09-01 LAB — CMP14+EGFR
ALT: 11 IU/L (ref 0–32)
AST: 26 IU/L (ref 0–40)
Albumin: 4.4 g/dL (ref 3.8–4.8)
Alkaline Phosphatase: 107 IU/L (ref 44–121)
BUN/Creatinine Ratio: 9 — ABNORMAL LOW (ref 12–28)
BUN: 7 mg/dL — ABNORMAL LOW (ref 8–27)
Bilirubin Total: 0.3 mg/dL (ref 0.0–1.2)
CO2: 20 mmol/L (ref 20–29)
Calcium: 9.5 mg/dL (ref 8.7–10.3)
Chloride: 102 mmol/L (ref 96–106)
Creatinine, Ser: 0.74 mg/dL (ref 0.57–1.00)
Globulin, Total: 3 g/dL (ref 1.5–4.5)
Glucose: 70 mg/dL (ref 70–99)
Potassium: 3.7 mmol/L (ref 3.5–5.2)
Sodium: 142 mmol/L (ref 134–144)
Total Protein: 7.4 g/dL (ref 6.0–8.5)
eGFR: 84 mL/min/{1.73_m2} (ref >59)

## 2023-09-01 LAB — URINALYSIS, COMPLETE
Bilirubin, UA: NEGATIVE
Glucose, UA: NEGATIVE
Ketones, UA: NEGATIVE
Leukocytes,UA: NEGATIVE
Nitrite, UA: NEGATIVE
Protein,UA: NEGATIVE
RBC, UA: NEGATIVE
Specific Gravity, UA: 1.012 (ref 1.005–1.030)
Urobilinogen, Ur: 0.2 mg/dL (ref 0.2–1.0)
pH, UA: 6.5 (ref 5.0–7.5)

## 2023-09-01 LAB — MICROSCOPIC EXAMINATION
Bacteria, UA: NONE SEEN
Casts: NONE SEEN /LPF
RBC, Urine: NONE SEEN /HPF (ref 0–2)
WBC, UA: NONE SEEN /HPF (ref 0–5)

## 2023-09-01 LAB — IRON,TIBC AND FERRITIN PANEL
Ferritin: 191 ng/mL — ABNORMAL HIGH (ref 15–150)
Iron Saturation: 22 % (ref 15–55)
Iron: 69 ug/dL (ref 27–139)
Total Iron Binding Capacity: 309 ug/dL (ref 250–450)
UIBC: 240 ug/dL (ref 118–369)

## 2023-09-01 LAB — VITAMIN B12: Vitamin B-12: 344 pg/mL (ref 232–1245)

## 2023-09-01 LAB — CBC
Hematocrit: 38 % (ref 34.0–46.6)
Hemoglobin: 12 g/dL (ref 11.1–15.9)
MCH: 29.4 pg (ref 26.6–33.0)
MCHC: 31.6 g/dL (ref 31.5–35.7)
MCV: 93 fL (ref 79–97)
Platelets: 217 10*3/uL (ref 150–450)
RBC: 4.08 x10E6/uL (ref 3.77–5.28)
RDW: 11.8 % (ref 11.7–15.4)
WBC: 4.6 10*3/uL (ref 3.4–10.8)

## 2023-09-01 LAB — PREALBUMIN: PREALBUMIN: 19 mg/dL (ref 9–32)

## 2023-09-05 ENCOUNTER — Telehealth: Payer: Self-pay | Admitting: Pharmacist

## 2023-09-05 ENCOUNTER — Encounter: Payer: Self-pay | Admitting: Internal Medicine

## 2023-09-05 DIAGNOSIS — M069 Rheumatoid arthritis, unspecified: Secondary | ICD-10-CM

## 2023-09-05 NOTE — Progress Notes (Incomplete Revision)
 09/05/2023 Name: Evelyn Orr MRN: [id number] DOB: Aug 31, 1945  Chief Complaint  Patient presents with   Medication Management   Medication Assistance    Truxima    Evelyn Orr is a 78 y.o. year old female who presented for a telephone visit.   They were referred to the pharmacist by their PCP for assistance in managing medication access.    Subjective:  Care Team: Primary Care Provider: Dorothyann Peng, MD ; Next Scheduled Visit 11/21/23 Rheumatologist Dr. Deanne Coffer -09/19/2023  Medication Access/Adherence  Current Pharmacy:  [id number] [place], [place] - [address] [place] [address] Phone: [phone] Fax: [phone]  [facility] [place], [place] - [phone] [facility] [address] [place] [address] Phone: [phone] Fax: [phone]   Patient reports affordability concerns with their medications: Yes  Truxima Patient reports access/transportation concerns to their pharmacy: No  Patient reports adherence concerns with their medications:  Yes  Truxima   Spoke with Patient. She expressed concern and discouragement about being able to get Truxima through her Rheumatologist.  She read me a letter she received from [facility] which seemed to hint to a "tier exception" request that was being denied.    Dr. Dereck Ligas office was called and message was left for his nurse.   Objective:  Lab Results  Component Value Date   HGBA1C 5.4 05/19/2023    Lab Results  Component Value Date   CREATININE 0.74 08/31/2023   BUN 7 (L) 08/31/2023   NA 142 08/31/2023   K 3.7 08/31/2023   CL 102 08/31/2023   CO2 20 08/31/2023    Lab Results  Component Value Date   CHOL 211 (H) 05/19/2023   HDL 66 05/19/2023   LDLCALC 123 (H) 05/19/2023   TRIG 126 05/19/2023   CHOLHDL 3.2 05/19/2023    Medications Reviewed Today     Reviewed by Beecher Mcardle, RPH (Pharmacist) on 09/05/23 at 1514   Med List Status: <None>   Medication Order Taking? Sig Documenting Provider Last Dose Status Informant  albuterol (VENTOLIN HFA) 108 (90 Base) MCG/ACT inhaler [id number]  TAKE 2 PUFFS BY MOUTH EVERY 6 HOURS AS NEEDED FOR WHEEZE OR SHORTNESS OF Weyman Rodney, MD  Active   cetirizine (ZYRTEC ALLERGY) 10 MG tablet [id number] Yes Take 1 tablet (10 mg total) by mouth daily. [name], FNP Taking Active   ERGOCALCIFEROL PO [id number] Yes TAKE 1 CAPSULE (50000UNITS) BY ORAL ROUTE 2 TIMES EVERY WEEK TUES/FRI [provider] Taking Active   Ferric Maltol (ACCRUFER) 30 MG CAPS [id number] Yes Take 1 capsule (30 mg total) by mouth daily. Dorothyann Peng, MD Taking Active   folic acid (FOLVITE) 1 MG tablet [id number] Yes 1 tablet [provider] Taking Active   folic acid (FOLVITE) 1 MG tablet [id number] Yes 1 tablet Orally Once a day for 90 days [provider] Taking Active   leflunomide (ARAVA) 20 MG tablet [id number] Yes TAKE 1 TABLET BY MOUTH EVERY DAY FOR 30 DAYS for 30 [provider] Taking Active   levothyroxine (SYNTHROID) 88 MCG tablet [id number] Yes Take one tablet by mouth once daily Dorothyann Peng, MD Taking Active   methotrexate (RHEUMATREX) 2.5 MG tablet [id number]  Take 8 tablets by mouth once a week. [provider]  Active   mometasone (NASONEX) 50 MCG/ACT nasal spray [id number]  Place 2 sprays into the nose daily. Dorothyann Peng, MD  Active   montelukast Sharene Butters)  10 MG tablet [id number] Yes Take 1 tablet by mouth daily. [provider] Taking Active   montelukast (SINGULAIR) 10 MG tablet [id number]  TAKE 1 TABLET BY MOUTH EVERY DAY Dorothyann Peng, MD  Active   nystatin ointment (MYCOSTATIN) [id number] Yes APPLY TO THE AFFECTED AREA(S) BY TOPICAL ROUTE 2 TIMES PER DAY x 14 DAYS THEN AS NEEDED [provider] Taking Active   peg 3350 powder (MOVIPREP) 100 g SOLR [id number] No   Patient not taking: Reported on 09/05/2023   [provider] Not Taking Active   predniSONE (DELTASONE) 20 MG tablet [id number] No Take 2 tablets by mouth daily with breakfast for 5 days  Patient not taking: Reported on 09/05/2023   [name], FNP Not Taking Consider Medication Status and Discontinue (Completed Course)   sulfaSALAzine (AZULFIDINE) 500 MG tablet [id number] Yes Take 1,000 mg by mouth 2 (two) times daily. [provider] Taking Active Self  triamcinolone cream (KENALOG) 0.1 % [id number] Yes Apply 1 Application topically 2 (two) times daily. Gustavus Bryant, [place] Taking Active   TRUXIMA 500 MG/50ML injection [id number]  as directed Intravenous [provider]  Active               Assessment/Plan:   Patient wondered about the ability to restart Truxima infusions .  She reported being off of the infusions for more than a year due to cost. Her provider's office was called for clarification.   There does not appear to be a patient assistance program for Truxima but there are some founations that could help;  ([employer] and [employer].)  Follow Up Plan:     Await a call back from Dr. Dereck Ligas office. Call them back if I do not hear from them.   Reach back out to the Patient.\\   Beecher Mcardle, PharmD, BCACP Clinical Pharmacist [phone]   ADDENDUM  Charlene from Dr. Dereck Ligas office called me back.  She said Truxima is covered by [facility] but that the patient has a $600 deductible which they have explained to the patient several times.  After further investigation, it became clear that the patient has two insurances.Marland KitchenMarland KitchenBarrister's clerk. Last year, Truxima was filled on [employer].    [facility] was called.  The representative said Truxima was not covered but the biosimilar Ruxience is covered. Ruxience requires a PA but does NOT have a deductible.  Called and spoke with Charlene with Dr. Dereck Ligas office. She made a note and will speak with Dr. Deanne Coffer and Jamesetta Orleans (who does their billing

## 2023-09-05 NOTE — Progress Notes (Signed)
 09/05/2023 Name: Evelyn Orr MRN: [id number] DOB: 1945-06-17  Chief Complaint  Patient presents with   Medication Management   Medication Assistance    Truxima    Evelyn Orr is a 78 y.o. year old female who presented for a telephone visit.   They were referred to the pharmacist by their PCP for assistance in managing medication access.    Subjective:  Care Team: Primary Care Provider: Dorothyann Peng, MD ; Next Scheduled Visit 11/21/23 Rheumatologist Dr. Deanne Coffer -09/19/2023  Medication Access/Adherence  Current Pharmacy:  [id number] [place], [place] - [address] [place] [address] Phone: [phone] Fax: [phone]  [facility] [place] - [phone] [facility] [address] [place] [address] Phone: [phone] Fax: [phone]   Patient reports affordability concerns with their medications: Yes  Truxima Patient reports access/transportation concerns to their pharmacy: No  Patient reports adherence concerns with their medications:  Yes  Truxima   Spoke with Patient. She expressed concern and discouragement about being able to get Truxima through her Rheumatologist.  She read me a letter she received from [facility] which seemed to hint to a "tier exception" request that was being denied.    Dr. Dereck Ligas office was called and message was left for his nurse.   Objective:  Lab Results  Component Value Date   HGBA1C 5.4 05/19/2023    Lab Results  Component Value Date   CREATININE 0.74 08/31/2023   BUN 7 (L) 08/31/2023   NA 142 08/31/2023   K 3.7 08/31/2023   CL 102 08/31/2023   CO2 20 08/31/2023    Lab Results  Component Value Date   CHOL 211 (H) 05/19/2023   HDL 66 05/19/2023   LDLCALC 123 (H) 05/19/2023   TRIG 126 05/19/2023   CHOLHDL 3.2 05/19/2023    Medications Reviewed Today     Reviewed by Beecher Mcardle, RPH (Pharmacist) on 09/05/23 at 1514   Med List Status: <None>   Medication Order Taking? Sig Documenting Provider Last Dose Status Informant  albuterol (VENTOLIN HFA) 108 (90 Base) MCG/ACT inhaler [id number]  TAKE 2 PUFFS BY MOUTH EVERY 6 HOURS AS NEEDED FOR WHEEZE OR SHORTNESS OF Weyman Rodney, MD  Active   cetirizine (ZYRTEC ALLERGY) 10 MG tablet [id number] Yes Take 1 tablet (10 mg total) by mouth daily. [name], FNP Taking Active   ERGOCALCIFEROL PO [id number] Yes TAKE 1 CAPSULE (50000UNITS) BY ORAL ROUTE 2 TIMES EVERY WEEK TUES/FRI [provider] Taking Active   Ferric Maltol (ACCRUFER) 30 MG CAPS [id number] Yes Take 1 capsule (30 mg total) by mouth daily. Dorothyann Peng, MD Taking Active   folic acid (FOLVITE) 1 MG tablet [id number] Yes 1 tablet [provider] Taking Active   folic acid (FOLVITE) 1 MG tablet [id number] Yes 1 tablet Orally Once a day for 90 days [provider] Taking Active   leflunomide (ARAVA) 20 MG tablet [id number] Yes TAKE 1 TABLET BY MOUTH EVERY DAY FOR 30 DAYS for 30 [provider] Taking Active   levothyroxine (SYNTHROID) 88 MCG tablet [id number] Yes Take one tablet by mouth once daily Dorothyann Peng, MD Taking Active   methotrexate (RHEUMATREX) 2.5 MG tablet [id number]  Take 8 tablets by mouth once a week. [provider]  Active   mometasone (NASONEX) 50 MCG/ACT nasal spray [id number]  Place 2 sprays into the nose daily. Dorothyann Peng, MD  Active   montelukast Sharene Butters)  10 MG tablet [id number] Yes Take 1 tablet by mouth daily. [provider] Taking Active   montelukast (SINGULAIR) 10 MG tablet [id number]  TAKE 1 TABLET BY MOUTH EVERY DAY Dorothyann Peng, MD  Active   nystatin ointment (MYCOSTATIN) [id number] Yes APPLY TO THE AFFECTED AREA(S) BY TOPICAL ROUTE 2 TIMES PER DAY x 14 DAYS THEN AS NEEDED [provider] Taking Active   peg 3350 powder (MOVIPREP) 100 g SOLR [id number] No   Patient not taking: Reported on 09/05/2023   [provider] Not Taking Active   predniSONE (DELTASONE) 20 MG tablet [id number] No Take 2 tablets by mouth daily with breakfast for 5 days  Patient not taking: Reported on 09/05/2023   [name], FNP Not Taking Consider Medication Status and Discontinue (Completed Course)   sulfaSALAzine (AZULFIDINE) 500 MG tablet [id number] Yes Take 1,000 mg by mouth 2 (two) times daily. [provider] Taking Active Self  triamcinolone cream (KENALOG) 0.1 % [id number] Yes Apply 1 Application topically 2 (two) times daily. Gustavus Bryant, [place] Taking Active   TRUXIMA 500 MG/50ML injection [id number]  as directed Intravenous [provider]  Active               Assessment/Plan:   Patient wondered about the ability to restart Truxima infusions .  She reported being off of the infusions for more than a year due to cost. Her provider's office was called for clarification.   There does not appear to be a patient assistance program for Truxima but there are some founations that could help;  ([employer] and [employer].)  Follow Up Plan:     Await a call back from Dr. Dereck Ligas office. Call them back if I do not hear from them.   Reach back out to the Patient.\\   Beecher Mcardle, PharmD, BCACP Clinical Pharmacist [phone]   ADDENDUM  Charlene from Dr. Dereck Ligas office called me back.  She said Truxima is covered by [facility] but that the patient has a $600 deductible which they have explained to the patient several times.  After further investigation, it became clear that the patient has two insurances.Marland KitchenMarland KitchenBarrister's clerk. Last year, Truxima was filled on [employer].    [facility] was called.  The representative said Truxima was not covered but the biosimilar Ruxience is covered. Ruxience requires a PA but does NOT have a deductible.  Called and spoke with Charlene with Dr. Dereck Ligas office. She made a note and will speak with Dr. Deanne Coffer and Jamesetta Orleans (who does their  billing).  Plan:  Follow up later this week.   Beecher Mcardle, PharmD, BCACP Clinical Pharmacist [phone]

## 2023-09-06 ENCOUNTER — Ambulatory Visit (INDEPENDENT_AMBULATORY_CARE_PROVIDER_SITE_OTHER)

## 2023-09-06 VITALS — BP 110/60 | HR 78 | Temp 98.1°F | Ht 61.0 in | Wt 140.0 lb

## 2023-09-06 DIAGNOSIS — Z01818 Encounter for other preprocedural examination: Secondary | ICD-10-CM | POA: Diagnosis not present

## 2023-09-06 NOTE — Progress Notes (Signed)
 Patient presents today for a NV for a EKG.

## 2023-09-08 ENCOUNTER — Telehealth: Payer: Self-pay | Admitting: Pharmacist

## 2023-09-08 DIAGNOSIS — M069 Rheumatoid arthritis, unspecified: Secondary | ICD-10-CM

## 2023-09-08 NOTE — Progress Notes (Signed)
   09/08/2023  Patient ID: Evelyn Orr, female   DOB: 06-29-1945, 78 y.o.   MRN: [id number]  Called Dr. Dereck Ligas office and spoke with Alcario Drought. Alcario Drought said their office has to bill both the Patient's primary and secondary insurances to get the Patient a Truxima or Ruxience infusion therapy.  According to Alcario Drought, the Patient's primary insurance requires a $600 deductible and they cannot just bill the secondary insurance.  Their office orders the medication and dispenses/administers the medication at their office.    Called patient back to explain. She was placed on the [facility] waiting list.  It was suggested that she ask family members and perhaps her church to help contribute towards the deductible.  Patient said she would pray and think on asking others.    Plan: Send staff message to PCP about the status.  Beecher Mcardle, PharmD, BCACP Clinical Pharmacist [phone]

## 2023-09-11 DIAGNOSIS — D649 Anemia, unspecified: Secondary | ICD-10-CM | POA: Insufficient documentation

## 2023-09-11 DIAGNOSIS — R195 Other fecal abnormalities: Secondary | ICD-10-CM | POA: Insufficient documentation

## 2023-09-11 DIAGNOSIS — R634 Abnormal weight loss: Secondary | ICD-10-CM | POA: Insufficient documentation

## 2023-09-11 NOTE — Assessment & Plan Note (Addendum)
 Weight loss of 16 pounds over the past year with normal thyroid function. Possible increased physical activity or gastrointestinal cause due to blood in stool.  - Order blood work for blood count, protein levels, liver, and kidney function. - Send urine for analysis; if blood present, order renal ultrasound. - Ensure colonoscopy is scheduled post-GI appointment on the 17th. - it is possible that her regular exercise routine has contributed to her weight loss as well.

## 2023-09-11 NOTE — Assessment & Plan Note (Signed)
 I will check levels as below. She has guaiac positive stools. She has upcoming GI appt, colonoscopy and possible EGD will likely be performed.

## 2023-09-11 NOTE — Assessment & Plan Note (Signed)
 Blood in stool suggests possible gastrointestinal issue, awaiting colonoscopy for further investigation. - Ensure colonoscopy is scheduled post-GI appointment on the 17th. - Advise her to express urgency for colonoscopy during GI appointment.

## 2023-09-11 NOTE — Assessment & Plan Note (Signed)
 Difficulty obtaining approval for infusion therapy despite previous benefit. Inquiry about rheumatologist's support for Trixema infusion therapy. - Recheck with rheumatologist regarding criteria for infusion therapy approval. - Patient advised that Rheum may need to do PA for her infusion therapy.  - Will refer her to my [facility] pharmacist to see if the approval process can be expedited.  - She is currently on DMARD therapy.

## 2023-09-15 ENCOUNTER — Other Ambulatory Visit (HOSPITAL_COMMUNITY): Payer: Self-pay

## 2023-09-15 ENCOUNTER — Telehealth: Payer: Self-pay

## 2023-09-15 ENCOUNTER — Ambulatory Visit: Admitting: Gastroenterology

## 2023-09-15 ENCOUNTER — Encounter: Payer: Self-pay | Admitting: Gastroenterology

## 2023-09-15 VITALS — BP 122/72 | HR 78 | Ht 61.0 in | Wt 141.0 lb

## 2023-09-15 DIAGNOSIS — Z860100 Personal history of colon polyps, unspecified: Secondary | ICD-10-CM | POA: Diagnosis not present

## 2023-09-15 DIAGNOSIS — R195 Other fecal abnormalities: Secondary | ICD-10-CM | POA: Diagnosis not present

## 2023-09-15 MED ORDER — PLENVU 140 G PO SOLR: 1.0000 | Freq: Once | ORAL | 0 refills | Status: AC

## 2023-09-15 NOTE — Patient Instructions (Addendum)
 You have been scheduled for an endoscopy and colonoscopy. Please follow the written instructions given to you at your visit today.  If you use inhalers (even only as needed), please bring them with you on the day of your procedure.  DO NOT TAKE 7 DAYS PRIOR TO TEST- Trulicity (dulaglutide) Ozempic, Wegovy (semaglutide) Mounjaro (tirzepatide) Bydureon Bcise (exanatide extended release)  DO NOT TAKE 1 DAY PRIOR TO YOUR TEST Rybelsus (semaglutide) Adlyxin (lixisenatide) Victoza (liraglutide) Byetta (exanatide)  _______________________________________________________  If your blood pressure at your visit was 140/90 or greater, please contact your primary care physician to follow up on this.  _______________________________________________________  If you are age 76 or older, your body mass index should be between 23-30. Your Body mass index is 26.64 kg/m. If this is out of the aforementioned range listed, please consider follow up with your Primary Care Provider.  If you are age 3 or younger, your body mass index should be between 19-25. Your Body mass index is 26.64 kg/m. If this is out of the aformentioned range listed, please consider follow up with your Primary Care Provider.   ________________________________________________________  The  GI providers would like to encourage you to use MYCHART to communicate with providers for non-urgent requests or questions.  Due to long hold times on the telephone, sending your provider a message by [facility] may be a faster and more efficient way to get a response.  Please allow 48 business hours for a response.  Please remember that this is for non-urgent requests.  _______________________________________________________

## 2023-09-15 NOTE — Progress Notes (Signed)
 09/15/2023 Evelyn Orr [place] [id number] Aug 06, 1945   HISTORY OF PRESENT ILLNESS:  This is a 78 year old female who is a patient of Dr. Elana Alm.  She is here today for evaluation of FOBT positive stool.  Back in December she had a drop in her hemoglobin down to the 10 g range from normal.  Was found to be FOBT positive x 3.  Was started on once daily oral iron supplements.  Most recent hemoglobin earlier this month shows hemoglobin has returned to normal around 12 g.  Iron studies are normal with the ferritin actually slightly high.  She has been on iron supplements for about the past 2 months at this point.  Stools are dark green now with iron supplements, but previous to that she did not notice any dark stools, no bloody stools.  No other GI complaints.  She moves her bowels regularly without issues.  No heartburn or reflux.  No abdominal pain.  No previous EGD.  Colonoscopy 03/2021: - One 1 mm polyp in the ascending colon, removed with a cold biopsy forceps. Resected and retrieved. - Two 4 to 7 mm polyps in the transverse colon and in the cecum, removed with a cold snare. Resected and retrieved. - Diverticulosis in the sigmoid colon and in the descending colon. - Non- bleeding external and internal hemorrhoids.  Surgical [P], colon, cecum, ascending, transverse, polyp (3) - TUBULAR ADENOMA (3). WITHOUT HIGH GRADE DYSPLASIA.   Past Medical History:  Diagnosis Date   Arthritis    generalized   Breast cancer (HCC)    Breast cancer of upper-outer quadrant of right female breast (HCC) 03/20/2015   Cough    GERD (gastroesophageal reflux disease)    uses PRN meds/with certain foods   Hypothyroidism    Nasal congestion    PONV (postoperative nausea and vomiting)    Rheumatoid arthritis(714.0)    Seasonal allergies    Seasonal allergies    Thyroid disease    hypothyroidism   Past Surgical History:  Procedure Laterality Date   BREAST LUMPECTOMY WITH RADIOACTIVE SEED  LOCALIZATION Right 04/09/2015   Procedure: BREAST LUMPECTOMY WITH RADIOACTIVE SEED LOCALIZATION;  Surgeon: Emelia Loron, MD;  Location: [facility];  Service: General;  Laterality: Right;   BREAST REDUCTION SURGERY  2001   CALCANEAL OSTEOTOMY Left 10/11/2013   Procedure: LEFT CALCANEAL OSTEOTOMY;  Surgeon: Toni Arthurs, MD;  Location: [facility];  Service: Orthopedics;  Laterality: Left;   CARPAL TUNNEL RELEASE     left   CERVICAL FUSION  2009   COLONOSCOPY     2002,2005,2008 w/Brodie   COLONOSCOPY  2015   Brodie-MAC-prep good-TA- recall 5 yrs   GASTROCNEMIUS RECESSION Left 10/11/2013   Procedure: LEFT GASTROC RECESSION; LEFT POSTERIOR TIBIAL TENOLYSIS;  Surgeon: Toni Arthurs, MD;  Location: [facility];  Service: Orthopedics;  Laterality: Left;   KNEE ARTHROSCOPY  2004   right   POLYPECTOMY     REMOVAL OF IMPLANT Left 02/14/2014   Procedure: LEFT FOOT REMOVAL OF DEEP IMPLANT;  Surgeon: Toni Arthurs, MD;  Location: [facility];  Service: Orthopedics;  Laterality: Left;   REVERSE SHOULDER ARTHROPLASTY Right 03/05/2021   Procedure: REVERSE SHOULDER ARTHROPLASTY;  Surgeon: Jones Broom, MD;  Location: [facility];  Service: Orthopedics;  Laterality: Right;   SHOULDER ARTHROSCOPY W/ ROTATOR CUFF REPAIR  2006   right   THYROIDECTOMY     age 16   TONSILLECTOMY     TOTAL KNEE ARTHROPLASTY  Right 06/21/2016   Procedure: RIGHT TOTAL KNEE ARTHROPLASTY;  Surgeon: Durene Romans, MD;  Location: [facility];  Service: Orthopedics;  Laterality: Right;   TUBAL LIGATION     WRIST ARTHRODESIS  2003   left     reports that she quit smoking about 39 years ago. Her smoking use included cigarettes. She started smoking about 60 years ago. She has a 5.3 pack-year smoking history. She has never used smokeless tobacco. She reports that she does not drink alcohol and does not use drugs. family history includes Breast cancer in her cousin and paternal aunt;  Cancer in an other family member; Colon cancer in her paternal uncle and another family member; Colon polyps in her brother, brother, and mother; Colon polyps (age of onset: 32) in her son; Heart attack in her father; Heart disease in her father; Hypertension in her mother; Other in her sister; Prostate cancer in her maternal grandfather, maternal uncle, maternal uncle, and maternal uncle; Prostate cancer (age of onset: 61) in her brother. No Known Allergies    Outpatient Encounter Medications as of 09/15/2023  Medication Sig   albuterol (VENTOLIN HFA) 108 (90 Base) MCG/ACT inhaler TAKE 2 PUFFS BY MOUTH EVERY 6 HOURS AS NEEDED FOR WHEEZE OR SHORTNESS OF BREATH   cetirizine (ZYRTEC ALLERGY) 10 MG tablet Take 1 tablet (10 mg total) by mouth daily.   ERGOCALCIFEROL PO TAKE 1 CAPSULE (50000UNITS) BY ORAL ROUTE 2 TIMES EVERY WEEK TUES/FRI   Ferric Maltol (ACCRUFER) 30 MG CAPS Take 1 capsule (30 mg total) by mouth daily.   folic acid (FOLVITE) 1 MG tablet 1 tablet   folic acid (FOLVITE) 1 MG tablet 1 tablet Orally Once a day for 90 days   leflunomide (ARAVA) 20 MG tablet TAKE 1 TABLET BY MOUTH EVERY DAY FOR 30 DAYS for 30   levothyroxine (SYNTHROID) 88 MCG tablet Take one tablet by mouth once daily   methotrexate (RHEUMATREX) 2.5 MG tablet Take 8 tablets by mouth once a week.   mometasone (NASONEX) 50 MCG/ACT nasal spray Place 2 sprays into the nose daily.   montelukast (SINGULAIR) 10 MG tablet Take 1 tablet by mouth daily.   montelukast (SINGULAIR) 10 MG tablet TAKE 1 TABLET BY MOUTH EVERY DAY   nystatin ointment (MYCOSTATIN) APPLY TO THE AFFECTED AREA(S) BY TOPICAL ROUTE 2 TIMES PER DAY x 14 DAYS THEN AS NEEDED   peg 3350 powder (MOVIPREP) 100 g SOLR    predniSONE (DELTASONE) 20 MG tablet Take 2 tablets by mouth daily with breakfast for 5 days   sulfaSALAzine (AZULFIDINE) 500 MG tablet Take 1,000 mg by mouth 2 (two) times daily.   triamcinolone cream (KENALOG) 0.1 % Apply 1 Application topically 2  (two) times daily.   TRUXIMA 500 MG/50ML injection    No facility-administered encounter medications on file as of 09/15/2023.    REVIEW OF SYSTEMS  : All other systems reviewed and negative except where noted in the History of Present Illness.   PHYSICAL EXAM: BP 122/72   Pulse 78   Ht 5\' 1"  (1.549 m)   Wt 141 lb (64 kg)   BMI 26.64 kg/m  General: Well developed female in no acute distress Head: Normocephalic and atraumatic Eyes:  Sclerae anicteric, conjunctiva pink. Ears: Normal auditory acuity Lungs: Clear throughout to auscultation Heart: Regular rate and rhythm Rectal: Will be done at the time of colonoscopy. Musculoskeletal: Symmetrical with no gross deformities  Skin: No lesions on visible extremities Neurological: Alert oriented x 4, grossly non-focal Psychological:  Alert and cooperative. Normal mood and affect  ASSESSMENT AND PLAN: *FOBT positive: Back in December she had a drop in her hemoglobin down to the 10 g range from normal.  Was found to be FOBT positive x 3.  Was started on once daily oral iron supplements.  Most recent hemoglobin earlier this month shows hemoglobin has returned to normal around 12 g.  Iron studies are normal with the ferritin actually slightly high.  She has been on iron supplements for about the past 2 months at this point.  Stools are dark green now with iron supplements, but previous to that she did not notice any dark stools, no bloody stools. *Personal history of colon polyps: Colonoscopy 03/2021 with repeat recommended in 3 years, so due later this year.  **FOBT positive certainly could have come from hemorrhoids as she had both internal and external hemorrhoids on her last colonoscopy, but with drop in hemoglobin recently as well we will plan for EGD and sooner colonoscopy with Dr. Mee Spillers.   CC:  Cleave Curling, MD

## 2023-09-15 NOTE — Telephone Encounter (Signed)
 Pharmacy Patient Advocate Encounter   Received notification from CoverMyMeds that prior authorization for Plenvu 140GM solution is required/requested.   Insurance verification completed.   The patient is insured through [facility] .   Per test claim: PA required; PA submitted to above mentioned insurance via CoverMyMeds Key/confirmation #/EOC [id number] Status is pending

## 2023-09-15 NOTE — Telephone Encounter (Signed)
 Pharmacy Patient Advocate Encounter  Received notification from [facility] that Prior Authorization for Plenvu 140GM solution has been APPROVED from 09-15-2023 to 09-14-2024   PA #/Case ID/Reference #: [id number]

## 2023-09-19 DIAGNOSIS — M79643 Pain in unspecified hand: Secondary | ICD-10-CM | POA: Diagnosis not present

## 2023-09-19 DIAGNOSIS — M7989 Other specified soft tissue disorders: Secondary | ICD-10-CM | POA: Diagnosis not present

## 2023-09-19 DIAGNOSIS — M79642 Pain in left hand: Secondary | ICD-10-CM | POA: Diagnosis not present

## 2023-09-19 DIAGNOSIS — M79641 Pain in right hand: Secondary | ICD-10-CM | POA: Diagnosis not present

## 2023-09-19 DIAGNOSIS — Z79899 Other long term (current) drug therapy: Secondary | ICD-10-CM | POA: Diagnosis not present

## 2023-09-19 DIAGNOSIS — M0589 Other rheumatoid arthritis with rheumatoid factor of multiple sites: Secondary | ICD-10-CM | POA: Diagnosis not present

## 2023-09-19 DIAGNOSIS — M79671 Pain in right foot: Secondary | ICD-10-CM | POA: Diagnosis not present

## 2023-09-19 DIAGNOSIS — M25562 Pain in left knee: Secondary | ICD-10-CM | POA: Diagnosis not present

## 2023-09-19 DIAGNOSIS — M8580 Other specified disorders of bone density and structure, unspecified site: Secondary | ICD-10-CM | POA: Diagnosis not present

## 2023-09-19 DIAGNOSIS — M1990 Unspecified osteoarthritis, unspecified site: Secondary | ICD-10-CM | POA: Diagnosis not present

## 2023-09-19 DIAGNOSIS — M79672 Pain in left foot: Secondary | ICD-10-CM | POA: Diagnosis not present

## 2023-10-10 ENCOUNTER — Encounter (HOSPITAL_COMMUNITY): Payer: Self-pay

## 2023-10-17 ENCOUNTER — Ambulatory Visit (AMBULATORY_SURGERY_CENTER): Admitting: Gastroenterology

## 2023-10-17 ENCOUNTER — Encounter: Payer: Self-pay | Admitting: Gastroenterology

## 2023-10-17 VITALS — BP 114/72 | HR 64 | Temp 98.6°F | Resp 16 | Ht 61.0 in | Wt 141.0 lb

## 2023-10-17 DIAGNOSIS — K2100 Gastro-esophageal reflux disease with esophagitis, without bleeding: Secondary | ICD-10-CM

## 2023-10-17 DIAGNOSIS — K449 Diaphragmatic hernia without obstruction or gangrene: Secondary | ICD-10-CM | POA: Diagnosis not present

## 2023-10-17 DIAGNOSIS — K31A11 Gastric intestinal metaplasia without dysplasia, involving the antrum: Secondary | ICD-10-CM | POA: Diagnosis not present

## 2023-10-17 DIAGNOSIS — K2951 Unspecified chronic gastritis with bleeding: Secondary | ICD-10-CM | POA: Diagnosis not present

## 2023-10-17 DIAGNOSIS — K648 Other hemorrhoids: Secondary | ICD-10-CM | POA: Diagnosis not present

## 2023-10-17 DIAGNOSIS — K3189 Other diseases of stomach and duodenum: Secondary | ICD-10-CM | POA: Diagnosis not present

## 2023-10-17 DIAGNOSIS — K644 Residual hemorrhoidal skin tags: Secondary | ICD-10-CM | POA: Diagnosis not present

## 2023-10-17 DIAGNOSIS — K5730 Diverticulosis of large intestine without perforation or abscess without bleeding: Secondary | ICD-10-CM

## 2023-10-17 DIAGNOSIS — K2940 Chronic atrophic gastritis without bleeding: Secondary | ICD-10-CM | POA: Diagnosis not present

## 2023-10-17 DIAGNOSIS — R195 Other fecal abnormalities: Secondary | ICD-10-CM

## 2023-10-17 DIAGNOSIS — M069 Rheumatoid arthritis, unspecified: Secondary | ICD-10-CM | POA: Diagnosis not present

## 2023-10-17 DIAGNOSIS — K31A15 Gastric intestinal metaplasia without dysplasia, involving multiple sites: Secondary | ICD-10-CM

## 2023-10-17 DIAGNOSIS — D123 Benign neoplasm of transverse colon: Secondary | ICD-10-CM

## 2023-10-17 DIAGNOSIS — E039 Hypothyroidism, unspecified: Secondary | ICD-10-CM | POA: Diagnosis not present

## 2023-10-17 MED ORDER — SODIUM CHLORIDE 0.9 % IV SOLN: 500.0000 mL | Freq: Once | INTRAVENOUS | Status: DC

## 2023-10-17 NOTE — Progress Notes (Signed)
 Pt's states no medical or surgical changes since previsit or office visit.

## 2023-10-17 NOTE — Op Note (Signed)
 [facility] Patient Name: Evelyn Orr  Procedure Date: 10/17/2023 2:02 PM MRN: [id number] Endoscopist: Sergio Dandy , MD, [id number] Age: 78 Referring MD:  Date of Birth: Nov 10, 1945 Gender: Female Account #: [id number] Procedure:                Colonoscopy Indications:              Evaluation of unexplained GI bleeding presenting                            with fecal occult blood Medicines:                Monitored Anesthesia Care Procedure:                Pre-Anesthesia Assessment:                           - Prior to the procedure, a History and Physical                            was performed, and patient medications and                            allergies were reviewed. The patient's tolerance of                            previous anesthesia was also reviewed. The risks                            and benefits of the procedure and the sedation                            options and risks were discussed with the patient.                            All questions were answered, and informed consent                            was obtained. Prior Anticoagulants: The patient has                            taken no anticoagulant or antiplatelet agents. ASA                            Grade Assessment: II - A patient with mild systemic                            disease. After reviewing the risks and benefits,                            the patient was deemed in satisfactory condition to                            undergo the procedure.  After obtaining informed consent, the colonoscope                            was passed under direct vision. Throughout the                            procedure, the patient's blood pressure, pulse, and                            oxygen saturations were monitored continuously. The                            [id number]-HQ190L Colonoscope [id number] was introduced                            through the anus and advanced  to the the cecum,                            identified by appendiceal orifice and ileocecal                            valve. The colonoscopy was performed without                            difficulty. The patient tolerated the procedure                            well. The quality of the bowel preparation was                            good. The terminal ileum, ileocecal valve,                            appendiceal orifice, and rectum were photographed. Scope In: 2:17:25 PM Scope Out: 2:32:13 PM Scope Withdrawal Time: 0 hours 9 minutes 47 seconds  Total Procedure Duration: 0 hours 14 minutes 48 seconds  Findings:                 The perianal and digital rectal examinations were                            normal.                           A 4 mm polyp was found in the transverse colon. The                            polyp was sessile. The polyp was removed with a                            cold snare. Resection and retrieval were complete.                           Scattered small-mouthed diverticula were found in  the sigmoid colon and descending colon.                           Non-bleeding external and internal hemorrhoids were                            found during retroflexion. The hemorrhoids were                            small. Complications:            No immediate complications. Estimated Blood Loss:     Estimated blood loss was minimal. Impression:               - One 4 mm polyp in the transverse colon, removed                            with a cold snare. Resected and retrieved.                           - Diverticulosis in the sigmoid colon and in the                            descending colon.                           - Non-bleeding external and internal hemorrhoids. Recommendation:           - Patient has a contact number available for                            emergencies. The signs and symptoms of potential                             delayed complications were discussed with the                            patient. Return to normal activities tomorrow.                            Written discharge instructions were provided to the                            patient.                           - Resume previous diet.                           - Continue present medications.                           - Await pathology results.                           - No repeat colonoscopy due to age. Kiril Hippe V. Jaysie Benthall, MD 10/17/2023 2:46:07 PM This report has been signed electronically.

## 2023-10-17 NOTE — Patient Instructions (Signed)

## 2023-10-17 NOTE — Progress Notes (Signed)
 Report to PACU, RN, vss, BBS= Clear.

## 2023-10-17 NOTE — Op Note (Signed)
 [facility] Patient Name: Evelyn Orr  Procedure Date: 10/17/2023 2:03 PM MRN: [id number] Endoscopist: Sergio Dandy , MD, [id number] Age: 78 Referring MD:  Date of Birth: 1945-06-20 Gender: Female Account #: [id number] Procedure:                Upper GI endoscopy Indications:              Gastrointestinal bleeding of unknown origin Medicines:                Monitored Anesthesia Care Procedure:                Pre-Anesthesia Assessment:                           - Prior to the procedure, a History and Physical                            was performed, and patient medications and                            allergies were reviewed. The patient's tolerance of                            previous anesthesia was also reviewed. The risks                            and benefits of the procedure and the sedation                            options and risks were discussed with the patient.                            All questions were answered, and informed consent                            was obtained. Prior Anticoagulants: The patient has                            taken no anticoagulant or antiplatelet agents. ASA                            Grade Assessment: II - A patient with mild systemic                            disease. After reviewing the risks and benefits,                            the patient was deemed in satisfactory condition to                            undergo the procedure.                           After obtaining informed consent, the endoscope was  passed under direct vision. Throughout the                            procedure, the patient's blood pressure, pulse, and                            oxygen saturations were monitored continuously. The                            GIF [id number] #[id number] was introduced through the                            mouth, and advanced to the second part of duodenum.                             The upper GI endoscopy was accomplished without                            difficulty. The patient tolerated the procedure                            well. Scope In: Scope Out: Findings:                 LA Grade B (one or more mucosal breaks greater than                            5 mm, not extending between the tops of two mucosal                            folds) esophagitis with no bleeding was found 34 to                            36 cm from the incisors.                           A 2 cm hiatal hernia was present.                           Patchy mild inflammation characterized by                            congestion (edema), erythema and friability was                            found in the entire examined stomach. Biopsies were                            taken with a cold forceps for Helicobacter pylori                            testing.                           Localized moderate inflammation characterized  by                            nodularity was found in the gastric antrum.                            Biopsies were taken with a cold forceps for                            histology.                           The cardia and gastric fundus were normal on                            retroflexion.                           The examined duodenum was normal. Complications:            No immediate complications. Estimated Blood Loss:     Estimated blood loss was minimal. Impression:               - LA Grade B reflux esophagitis with no bleeding.                           - 2 cm hiatal hernia.                           - Gastritis. Biopsied.                           - Gastritis. Biopsied.                           - Normal examined duodenum. Recommendation:           - Resume previous diet.                           - Continue present medications.                           - Await pathology results.                           - Return to GI office at appointment to be                             scheduled in 2-3 months with Jessica Zehr or                            Dr.Chelisa Hennen. Kaleena Corrow V. Marlen Mollica, MD 10/17/2023 2:49:17 PM This report has been signed electronically.

## 2023-10-17 NOTE — Progress Notes (Signed)
 [facility] History and Physical   Primary Care Physician:  Cleave Curling, MD   Reason for Procedure:  Heme positive stool  Plan:    EGD and colonoscopy with possible interventions as needed     HPI: Evelyn Orr  is a very pleasant 78 y.o. female here for EGD and colonoscopy for further evaluation of Heme positive stool. Please refer to office visit note by Jessica Zehr for details  The risks and benefits as well as alternatives of endoscopic procedure(s) have been discussed and reviewed. All questions answered. The patient agrees to proceed.    Past Medical History:  Diagnosis Date   Arthritis    generalized   Breast cancer (HCC)    Breast cancer of upper-outer quadrant of right female breast (HCC) 03/20/2015   Cough    GERD (gastroesophageal reflux disease)    uses PRN meds/with certain foods   Hypothyroidism    Nasal congestion    PONV (postoperative nausea and vomiting)    Rheumatoid arthritis(714.0)    Seasonal allergies    Seasonal allergies    Thyroid  disease    hypothyroidism    Past Surgical History:  Procedure Laterality Date   BREAST LUMPECTOMY WITH RADIOACTIVE SEED LOCALIZATION Right 04/09/2015   Procedure: BREAST LUMPECTOMY WITH RADIOACTIVE SEED LOCALIZATION;  Surgeon: Enid Harry, MD;  Location: [facility];  Service: General;  Laterality: Right;   BREAST REDUCTION SURGERY  2001   CALCANEAL OSTEOTOMY Left 10/11/2013   Procedure: LEFT CALCANEAL OSTEOTOMY;  Surgeon: Amada Backer, MD;  Location: [facility];  Service: Orthopedics;  Laterality: Left;   CARPAL TUNNEL RELEASE     left   CERVICAL FUSION  2009   COLONOSCOPY     2002,2005,2008 w/Brodie   COLONOSCOPY  2015   Brodie-MAC-prep good-TA- recall 5 yrs   GASTROCNEMIUS RECESSION Left 10/11/2013   Procedure: LEFT GASTROC RECESSION; LEFT POSTERIOR TIBIAL TENOLYSIS;  Surgeon: Amada Backer, MD;  Location: [facility];  Service:  Orthopedics;  Laterality: Left;   KNEE ARTHROSCOPY  2004   right   POLYPECTOMY     REMOVAL OF IMPLANT Left 02/14/2014   Procedure: LEFT FOOT REMOVAL OF DEEP IMPLANT;  Surgeon: Amada Backer, MD;  Location: [facility];  Service: Orthopedics;  Laterality: Left;   REVERSE SHOULDER ARTHROPLASTY Right 03/05/2021   Procedure: REVERSE SHOULDER ARTHROPLASTY;  Surgeon: Sammye Cristal, MD;  Location: [facility];  Service: Orthopedics;  Laterality: Right;   SHOULDER ARTHROSCOPY W/ ROTATOR CUFF REPAIR  2006   right   THYROIDECTOMY     age 76   TONSILLECTOMY     TOTAL KNEE ARTHROPLASTY Right 06/21/2016   Procedure: RIGHT TOTAL KNEE ARTHROPLASTY;  Surgeon: Claiborne Crew, MD;  Location: [facility];  Service: Orthopedics;  Laterality: Right;   TUBAL LIGATION     WRIST ARTHRODESIS  2003   left     Prior to Admission medications   Medication Sig Start Date End Date Taking? Authorizing Provider  albuterol  (VENTOLIN  HFA) 108 (90 Base) MCG/ACT inhaler TAKE 2 PUFFS BY MOUTH EVERY 6 HOURS AS NEEDED FOR WHEEZE OR SHORTNESS OF BREATH 07/21/23   Cleave Curling, MD  cetirizine  (ZYRTEC  ALLERGY ) 10 MG tablet Take 1 tablet (10 mg total) by mouth daily. 12/13/22 12/13/23  Susanna Epley, FNP  ERGOCALCIFEROL  PO TAKE 1 CAPSULE (50000UNITS) BY ORAL ROUTE 2 TIMES EVERY WEEK TUES/FRI    [provider]  Ferric Maltol  (ACCRUFER ) 30 MG CAPS Take 1 capsule (30 mg total) by mouth daily.  07/28/23   Cleave Curling, MD  folic acid (FOLVITE) 1 MG tablet 1 tablet    [provider]  folic acid (FOLVITE) 1 MG tablet 1 tablet Orally Once a day for 90 days    [provider]  leflunomide (ARAVA) 20 MG tablet TAKE 1 TABLET BY MOUTH EVERY DAY FOR 30 DAYS for 30    [provider]  levothyroxine  (SYNTHROID ) 88 MCG tablet Take one tablet by mouth once daily 06/13/23   Cleave Curling, MD  methotrexate (RHEUMATREX) 2.5 MG tablet Take 8 tablets by mouth once a week.    [provider]   mometasone  (NASONEX ) 50 MCG/ACT nasal spray Place 2 sprays into the nose daily. 09/04/19   Cleave Curling, MD  montelukast  (SINGULAIR ) 10 MG tablet Take 1 tablet by mouth daily. 07/07/22   [provider]  montelukast  (SINGULAIR ) 10 MG tablet TAKE 1 TABLET BY MOUTH EVERY DAY 07/26/23   Cleave Curling, MD  nystatin ointment (MYCOSTATIN) APPLY TO THE AFFECTED AREA(S) BY TOPICAL ROUTE 2 TIMES PER DAY x 14 DAYS THEN AS NEEDED 01/26/22   [provider]  predniSONE  (DELTASONE ) 20 MG tablet Take 2 tablets by mouth daily with breakfast for 5 days 12/13/22   Susanna Epley, FNP  sulfaSALAzine  (AZULFIDINE ) 500 MG tablet Take 1,000 mg by mouth 2 (two) times daily. 10/23/19   [provider]  triamcinolone  cream (KENALOG ) 0.1 % Apply 1 Application topically 2 (two) times daily. 12/10/22   Dodson Freestone, FNP  TRUXIMA  500 MG/50ML injection     [provider]    Current Outpatient Medications  Medication Sig Dispense Refill   albuterol  (VENTOLIN  HFA) 108 (90 Base) MCG/ACT inhaler TAKE 2 PUFFS BY MOUTH EVERY 6 HOURS AS NEEDED FOR WHEEZE OR SHORTNESS OF BREATH 18 each 1   cetirizine  (ZYRTEC  ALLERGY ) 10 MG tablet Take 1 tablet (10 mg total) by mouth daily.     ERGOCALCIFEROL  PO TAKE 1 CAPSULE (50000UNITS) BY ORAL ROUTE 2 TIMES EVERY WEEK TUES/FRI     Ferric Maltol  (ACCRUFER ) 30 MG CAPS Take 1 capsule (30 mg total) by mouth daily. 90 capsule 1   folic acid (FOLVITE) 1 MG tablet 1 tablet     folic acid (FOLVITE) 1 MG tablet 1 tablet Orally Once a day for 90 days     leflunomide (ARAVA) 20 MG tablet TAKE 1 TABLET BY MOUTH EVERY DAY FOR 30 DAYS for 30     levothyroxine  (SYNTHROID ) 88 MCG tablet Take one tablet by mouth once daily 90 tablet 0   methotrexate (RHEUMATREX) 2.5 MG tablet Take 8 tablets by mouth once a week.     mometasone  (NASONEX ) 50 MCG/ACT nasal spray Place 2 sprays into the nose daily. 17 g 2   montelukast  (SINGULAIR ) 10 MG tablet Take 1 tablet by mouth daily.      montelukast  (SINGULAIR ) 10 MG tablet TAKE 1 TABLET BY MOUTH EVERY DAY 90 tablet 2   nystatin ointment (MYCOSTATIN) APPLY TO THE AFFECTED AREA(S) BY TOPICAL ROUTE 2 TIMES PER DAY x 14 DAYS THEN AS NEEDED     predniSONE  (DELTASONE ) 20 MG tablet Take 2 tablets by mouth daily with breakfast for 5 days 10 tablet 0   sulfaSALAzine  (AZULFIDINE ) 500 MG tablet Take 1,000 mg by mouth 2 (two) times daily.     triamcinolone  cream (KENALOG ) 0.1 % Apply 1 Application topically 2 (two) times daily. 30 g 0   TRUXIMA  500 MG/50ML injection      Current Facility-Administered Medications  Medication Dose Route Frequency Provider Last Rate Last Admin   0.9 %  sodium chloride  infusion  500 mL Intravenous Once Kristeena Meineke V, MD        Allergies as of 10/17/2023   (No Known Allergies)    Family History  Problem Relation Age of Onset   Colon polyps Mother    Hypertension Mother    Heart disease Father    Heart attack Father    Other Sister        high breast density   Colon polyps Brother    Prostate cancer Brother 52       unknown Gleason   Colon polyps Brother    Prostate cancer Maternal Uncle    Prostate cancer Maternal Uncle    Prostate cancer Maternal Uncle    Breast cancer Paternal Aunt        dx. 35 or younger   Colon cancer Paternal Uncle    Prostate cancer Maternal Grandfather        dx. older age   Colon polyps Son 25       colonoscopy for unspecified reason; found some colon polyps, has not had one since   Breast cancer Cousin        4 maternal cousin had breast cancer    Colon cancer Other    Cancer Other        MGM's sister   Rectal cancer Neg Hx    Stomach cancer Neg Hx    Esophageal cancer Neg Hx     Social History   Socioeconomic History   Marital status: Married    Spouse name: Not on file   Number of children: Not on file   Years of education: Not on file   Highest education level: Not on file  Occupational History   Occupation: retired  Tobacco Use   Smoking  status: Former    Current packs/day: 0.00    Average packs/day: 0.3 packs/day for 21.0 years (5.3 ttl pk-yrs)    Types: Cigarettes    Start date: 02/13/1963    Quit date: 02/13/1984    Years since quitting: 39.7   Smokeless tobacco: Never  Vaping Use   Vaping status: Never Used  Substance and Sexual Activity   Alcohol use: No   Drug use: No   Sexual activity: Not Currently    Birth control/protection: Post-menopausal  Other Topics Concern   Not on file  Social History Narrative   Not on file   Social Drivers of Health   Financial Resource Strain: Low Risk  (07/29/2023)   Overall Financial Resource Strain (CARDIA)    Difficulty of Paying Living Expenses: Not hard at all  Food Insecurity: No Food Insecurity (07/29/2023)   Hunger Vital Sign    Worried About Running Out of Food in the Last Year: Never true    Ran Out of Food in the Last Year: Never true  Transportation Needs: No Transportation Needs (06/03/2022)   PRAPARE - Administrator, Civil Service (Medical): No    Lack of Transportation (Non-Medical): No  Physical Activity: Sufficiently Active (07/29/2023)   Exercise Vital Sign    Days of Exercise per Week: 4 days    Minutes of Exercise per Session: 60 min  Stress: No Stress Concern Present (07/29/2023)   [employer] of Occupational Health - Occupational Stress Questionnaire    Feeling of Stress : Not at all  Social Connections: Socially Integrated (07/29/2023)   Social Connection and Isolation Panel [NHANES]  Frequency of Communication with Friends and Family: More than three times a week    Frequency of Social Gatherings with Friends and Family: More than three times a week    Attends Religious Services: More than 4 times per year    Active Member of [employer] or Organizations: No    Attends Engineer, structural: More than 4 times per year    Marital Status: Married  Catering manager Violence: Not At Risk (07/29/2023)   Humiliation, Afraid, Rape, and  Kick questionnaire    Fear of Current or Ex-Partner: No    Emotionally Abused: No    Physically Abused: No    Sexually Abused: No    Review of Systems:  All other review of systems negative except as mentioned in the HPI.  Physical Exam: Vital signs in last 24 hours: BP (!) 151/97   Pulse 87   Temp 98.6 F (37 C) (Temporal)   Ht 5\' 1"  (1.549 m)   Wt 141 lb (64 kg)   SpO2 96%   BMI 26.64 kg/m  General:   Alert, NAD Lungs:  Clear .   Heart:  Regular rate and rhythm Abdomen:  Soft, nontender and nondistended. Neuro/Psych:  Alert and cooperative. Normal mood and affect. A and O x 3  Reviewed labs, radiology imaging, old records and pertinent past GI work up  Patient is appropriate for planned procedure(s) and anesthesia in an ambulatory setting   K. Veena Dio Giller , MD [phone]

## 2023-10-17 NOTE — Progress Notes (Signed)
 Called to room to assist during endoscopic procedure.  Patient ID and intended procedure confirmed with present staff. Received instructions for my participation in the procedure from the performing physician.

## 2023-10-18 ENCOUNTER — Encounter: Payer: Self-pay | Admitting: Gastroenterology

## 2023-10-18 ENCOUNTER — Telehealth: Payer: Self-pay | Admitting: *Deleted

## 2023-10-18 NOTE — Telephone Encounter (Signed)
  Follow up Call-     10/17/2023    1:47 PM 04/16/2021    8:36 AM  Call back number  Post procedure Call Back phone  # [phone]  Permission to leave phone message Yes Yes     Patient questions:  Do you have a fever, pain , or abdominal swelling? No. Pain Score  0 *  Have you tolerated food without any problems? Yes.    Have you been able to return to your normal activities? Yes.    Do you have any questions about your discharge instructions: Diet   No. Medications  No. Follow up visit  No.  Do you have questions or concerns about your Care? No.  Actions: * If pain score is 4 or above: No action needed, pain <4.

## 2023-10-20 ENCOUNTER — Other Ambulatory Visit: Payer: Self-pay | Admitting: Orthopaedic Surgery

## 2023-10-20 NOTE — Care Plan (Signed)
 Ortho Bundle Case Management Note  Patient Details  Name: Evelyn Orr  MRN: [id number] Date of Birth: [birth year]  Spoke with patient prior to surgery. She will discharge to home with family to assist. Has rolling walker at home. OPPT set up with [facility] [facility]. Discharge mailed. Patient and MD in agreement with plan. Choice offered                     DME Arranged:    DME Agency:     HH Arranged:    HH Agency:     Additional Comments: Please contact me with any questions of if this plan should need to change.  Cornelia Dieter,  RN,BSN,MHA,CCM  [facility]opa[facility]ist  [phone] 10/20/2023, 2:18 PM

## 2023-10-21 LAB — SURGICAL PATHOLOGY

## 2023-10-26 DIAGNOSIS — R262 Difficulty in walking, not elsewhere classified: Secondary | ICD-10-CM | POA: Diagnosis not present

## 2023-10-26 DIAGNOSIS — M25662 Stiffness of left knee, not elsewhere classified: Secondary | ICD-10-CM | POA: Diagnosis not present

## 2023-10-26 DIAGNOSIS — M1732 Unilateral post-traumatic osteoarthritis, left knee: Secondary | ICD-10-CM | POA: Diagnosis not present

## 2023-10-26 NOTE — Patient Instructions (Addendum)
 SURGICAL WAITING ROOM VISITATION Patients having surgery or a procedure may have no more than 2 support people in the waiting area - these visitors may rotate in the visitor waiting room.   If the patient needs to stay at the hospital during part of their recovery, the visitor guidelines for inpatient rooms apply.  PRE-OP VISITATION  Pre-op nurse will coordinate an appropriate time for 1 support person to accompany the patient in pre-op.  This support person may not rotate.  This visitor will be contacted when the time is appropriate for the visitor to come back in the pre-op area.  Please refer to the [facility] website for the visitor guidelines for Inpatients (after your surgery is over and you are in a regular room).  You are not required to quarantine at this time prior to your surgery. However, you must do this: Hand Hygiene often Do NOT share personal items Notify your provider if you are in close contact with someone who has COVID or you develop fever 100.4 or greater, new onset of sneezing, cough, sore throat, shortness of breath or body aches.  If you test positive for Covid or have been in contact with anyone that has tested positive in the last 10 days please notify you surgeon.    Your procedure is scheduled on:  Tuesday  November 01, 2023  Report to [facility] Main Entrance: [address] entrance where the [employer] is available.   Report to admitting at:  07:30     AM  Call this number if you have any questions or problems the morning of surgery [phone]  Do not eat food after Midnight the night prior to your surgery/procedure.  After Midnight you may have the following liquids until  07:00 AM DAY OF SURGERY  Clear Liquid Diet Water  Black Coffee (sugar ok, NO MILK/CREAM OR CREAMERS)  Tea (sugar ok, NO MILK/CREAM OR CREAMERS) regular and decaf                             Plain Jell-O  with no fruit (NO RED)                                           Fruit ices  (not with fruit pulp, NO RED)                                     Popsicles (NO RED)                                                                  Juice: NO CITRUS JUICES: only apple, WHITE grape, WHITE cranberry Sports drinks like Gatorade or Powerade (NO RED)                   The day of surgery:  Drink ONE (1) Pre-Surgery Clear Ensure at   07:00 AM the morning of surgery. Drink in one sitting. Do not sip.  This drink was given to you during your hospital pre-op appointment visit. Nothing else to  drink after completing the Pre-Surgery Clear Ensure: No candy, chewing gum or throat lozenges.    FOLLOW ANY ADDITIONAL PRE OP INSTRUCTIONS YOU RECEIVED FROM YOUR SURGEON'S OFFICE!!!   Oral Hygiene is also important to reduce your risk of infection.        Remember - BRUSH YOUR TEETH THE MORNING OF SURGERY WITH YOUR REGULAR TOOTHPASTE  Do NOT smoke after Midnight the night before surgery.   STOP TAKING all Vitamins, Herbs and supplements 1 week before your surgery.   STOP TAKING LEFLUNOMIDE (ARAVA) and SULFASALAZINE  for 72 hours before your surgery. Last dose will be taken on Friday 10-28-23.  Take ONLY these medicines the morning of surgery with A SIP OF WATER : levothyroxine , cetirizine  and montelukast . You may use your Albuterol  inhaler if needed. (Please bring your inhaler with you the morning of your surgery)                   You may not have any metal on your body including hair pins, jewelry, and body piercing  Do not wear make-up, lotions, powders, perfumes or deodorant  Do not wear nail polish including gel and S&S, artificial / acrylic nails, or any other type of covering on natural nails including finger and toenails. If you have artificial nails, gel coating, etc., that needs to be removed by a nail salon, Please have this removed prior to surgery. Not doing so may mean that your surgery could be cancelled or delayed if the Surgeon or anesthesia staff feels like they are  unable to monitor you safely.   Do not shave 48 hours prior to surgery to avoid nicks in your skin which may contribute to postoperative infections.   Contacts, Hearing Aids, dentures or bridgework may not be worn into surgery. DENTURES WILL BE REMOVED PRIOR TO SURGERY PLEASE DO NOT APPLY "Poly grip" OR ADHESIVES!!!  You may bring a small overnight bag with you on the day of surgery, only pack items that are not valuable. [facility] IS NOT RESPONSIBLE   FOR VALUABLES THAT ARE LOST OR STOLEN.   Do not bring your home medications to the hospital. The Pharmacy will dispense medications listed on your medication list to you during your admission in the Hospital.  Special Instructions: Bring a copy of your healthcare power of attorney and living will documents the day of surgery, if you wish to have them scanned into your [facility] Records- EPIC  Please read over the following fact sheets you were given: IF YOU HAVE QUESTIONS ABOUT YOUR PRE-OP INSTRUCTIONS, PLEASE CALL [phone].     Pre-operative 5 CHG Bath Instructions   You can play a key role in reducing the risk of infection after surgery. Your skin needs to be as free of germs as possible. You can reduce the number of germs on your skin by washing with CHG (chlorhexidine  gluconate) soap before surgery. CHG is an antiseptic soap that kills germs and continues to kill germs even after washing.   DO NOT use if you have an allergy  to chlorhexidine /CHG or antibacterial soaps. If your skin becomes reddened or irritated, stop using the CHG and notify one of our RNs at [phone]  Please shower with the CHG soap starting 4 days before surgery using the following schedule: START SHOWERS ON                Friday  Oct 28, 2023  Please keep in mind the following:   DO NOT shave, including legs and underarms, starting the day of your first shower.   You may shave your face at any point before/day of surgery.   Place clean sheets on your bed the day you start using CHG soap. Use a clean washcloth (not used since being washed) for each shower. DO NOT sleep with pets once you start using the CHG.   CHG Shower Instructions:  If you choose to wash your hair and private area, wash first with your normal shampoo/soap.  After you use shampoo/soap, rinse your hair and body thoroughly to remove shampoo/soap residue.  Turn the water  OFF and apply about 3 tablespoons (45 ml) of CHG soap to a CLEAN washcloth.  Apply CHG soap ONLY FROM YOUR NECK DOWN TO YOUR TOES (washing for 3-5 minutes)  DO NOT use CHG soap on face, private areas, open wounds, or sores.  Pay special attention to the area where your surgery is being performed.  If you are having back surgery, having someone wash your back for you may be helpful.  Wait 2 minutes after CHG soap is applied, then you may rinse off the CHG soap.  Pat dry with a clean towel  Put on clean clothes/pajamas   If you choose to wear lotion, please use ONLY the CHG-compatible lotions on the back of this paper.     Additional instructions for the day of surgery: DO NOT APPLY any lotions, deodorants, cologne, or perfumes.   Put on clean/comfortable clothes.  Brush your teeth.  Ask your nurse before applying any prescription medications to the skin.      CHG Compatible Lotions   Aveeno Moisturizing lotion  Cetaphil Moisturizing Cream  Cetaphil Moisturizing Lotion  Clairol Herbal Essence Moisturizing Lotion, Dry Skin  Clairol Herbal Essence Moisturizing Lotion, Extra Dry Skin  Clairol Herbal Essence Moisturizing Lotion, Normal Skin  Curel Age Defying Therapeutic Moisturizing Lotion with Alpha Hydroxy  Curel Extreme Care Body Lotion  Curel Soothing Hands Moisturizing Hand Lotion  Curel Therapeutic Moisturizing  Cream, Fragrance-Free  Curel Therapeutic Moisturizing Lotion, Fragrance-Free  Curel Therapeutic Moisturizing Lotion, Original Formula  Eucerin Daily Replenishing Lotion  Eucerin Dry Skin Therapy Plus Alpha Hydroxy Crme  Eucerin Dry Skin Therapy Plus Alpha Hydroxy Lotion  Eucerin Original Crme  Eucerin Original Lotion  Eucerin Plus Crme Eucerin Plus Lotion  Eucerin TriLipid Replenishing Lotion  Keri Anti-Bacterial Hand Lotion  Keri Deep Conditioning Original Lotion Dry Skin Formula Softly Scented  Keri Deep Conditioning Original Lotion, Fragrance Free Sensitive Skin Formula  Keri Lotion Fast Absorbing Fragrance Free Sensitive Skin Formula  Keri Lotion Fast Absorbing Softly Scented Dry Skin Formula  Keri Original Lotion  Keri Skin Renewal Lotion Keri Silky Smooth Lotion  Keri Silky Smooth Sensitive Skin Lotion  Nivea Body Creamy Conditioning Oil  Nivea Body Extra Enriched Lotion  Nivea Body Original Lotion  Nivea Body Sheer Moisturizing Lotion Nivea Crme  Nivea Skin Firming Lotion  NutraDerm 30 Skin Lotion  NutraDerm Skin Lotion  NutraDerm Therapeutic Skin Cream  NutraDerm Therapeutic Skin Lotion  ProShield Protective Hand Cream  Provon moisturizing lotion   FAILURE TO FOLLOW THESE INSTRUCTIONS MAY RESULT IN THE CANCELLATION OF YOUR SURGERY  PATIENT SIGNATURE_________________________________  NURSE SIGNATURE__________________________________  ________________________________________________________________________        Evelyn Orr    An incentive spirometer is a tool that can help keep your lungs clear and active. This tool measures how well you are filling your lungs with each breath.  Taking long deep breaths may help reverse or decrease the chance of developing breathing (pulmonary) problems (especially infection) following: A long period of time when you are unable to move or be active. BEFORE THE PROCEDURE  If the spirometer includes an indicator to  show your best effort, your nurse or respiratory therapist will set it to a desired goal. If possible, sit up straight or lean slightly forward. Try not to slouch. Hold the incentive spirometer in an upright position. INSTRUCTIONS FOR USE  Sit on the edge of your bed if possible, or sit up as far as you can in bed or on a chair. Hold the incentive spirometer in an upright position. Breathe out normally. Place the mouthpiece in your mouth and seal your lips tightly around it. Breathe in slowly and as deeply as possible, raising the piston or the ball toward the top of the column. Hold your breath for 3-5 seconds or for as long as possible. Allow the piston or ball to fall to the bottom of the column. Remove the mouthpiece from your mouth and breathe out normally. Rest for a few seconds and repeat Steps 1 through 7 at least 10 times every 1-2 hours when you are awake. Take your time and take a few normal breaths between deep breaths. The spirometer may include an indicator to show your best effort. Use the indicator as a goal to work toward during each repetition. After each set of 10 deep breaths, practice coughing to be sure your lungs are clear. If you have an incision (the cut made at the time of surgery), support your incision when coughing by placing a pillow or rolled up towels firmly against it. Once you are able to get out of bed, walk around indoors and cough well. You may stop using the incentive spirometer when instructed by your caregiver.  RISKS AND COMPLICATIONS Take your time so you do not get dizzy or light-headed. If you are in pain, you may need to take or ask for pain medication before doing incentive spirometry. It is harder to take a deep breath if you are having pain. AFTER USE Rest and breathe slowly and easily. It can be helpful to keep track of a log of your progress. Your caregiver can provide you with a simple table to help with this. If you are using the spirometer at  home, follow these instructions: SEEK MEDICAL CARE IF:  You are having difficultly using the spirometer. You have trouble using the spirometer as often as instructed. Your pain medication is not giving enough relief while using the spirometer. You develop fever of 100.5 F (38.1 C) or higher.                                                                                                    SEEK IMMEDIATE MEDICAL CARE IF:  You cough up bloody sputum that had not been present before. You develop fever of 102 F (38.9 C) or greater. You develop worsening pain at or near the incision site. MAKE SURE YOU:  Understand these instructions. Will watch your  condition. Will get help right away if you are not doing well or get worse. Document Released: 09/27/2006 Document Revised: 08/09/2011 Document Reviewed: 11/28/2006 [facility] Patient Information 2014 [place], [place].       If you would like to see a video about joint replacement:   [email or web address]

## 2023-10-26 NOTE — Progress Notes (Signed)
 COVID Vaccine received:  []  No [x]  Yes Date of any COVID positive Test in last 90 days:  PCP - Cleave Curling, MD Cardiologist - none Rheumatology- Govinda Aryal  Chest x-ray - 03-04-2021  2v  Epic,  repeat ordered EKG -  09-06-2023  Epic Stress Test -  ECHO -  Cardiac Cath -  CT Coronary Calcium score:   Pacemaker / ICD device [x]  No []  Yes   Spinal Cord Stimulator:[x]  No []  Yes       History of Sleep Apnea? [x]  No []  Yes   CPAP used?- [x]  No []  Yes    Does the patient monitor blood sugar?   [x]  N/A   []  No []  Yes  Patient has: [x]  NO Hx DM   []  Pre-DM   []  DM1  []   DM2 Last A1c was:  5.4 normal on  05-19-23      Blood Thinner / Instructions:  none Aspirin  Instructions:  none  ERAS Protocol Ordered: []  No  [x]  Yes PRE-SURGERY [x]  ENSURE  []  G2   Patient is to be NPO after: 0700  Dental hx: []  Dentures:  []  N/A      []  Bridge or Partial:                   []  Loose or Damaged teeth:   Comments: Patient was given the 5 CHG shower / bath instructions for TKA surgery along with 2 bottles of the CHG soap. Patient will start this on:  10-28-23            Activity level: Able to walk up 2 flights of stairs without becoming significantly short of breath or having chest pain?  []  No   []    Yes   Anesthesia review: RA, anemia, GERD, PONV  Patient denies shortness of breath, fever, cough and chest pain at PAT appointment.  Patient verbalized understanding and agreement to the Pre-Surgical Instructions that were given to them at this PAT appointment. Patient was also educated of the need to review these PAT instructions again prior to her surgery.I reviewed the appropriate phone numbers to call if they have any and questions or concerns.

## 2023-10-27 ENCOUNTER — Ambulatory Visit (HOSPITAL_COMMUNITY)
Admission: RE | Admit: 2023-10-27 | Discharge: 2023-10-27 | Disposition: A | Discharge status: Home / Self Care | Source: Ambulatory Visit | Attending: Orthopaedic Surgery | Admitting: Orthopaedic Surgery

## 2023-10-27 ENCOUNTER — Encounter (HOSPITAL_COMMUNITY): Payer: Self-pay

## 2023-10-27 ENCOUNTER — Encounter (HOSPITAL_COMMUNITY)
Admission: RE | Admit: 2023-10-27 | Discharge: 2023-10-27 | Disposition: A | Discharge status: Home / Self Care | Source: Ambulatory Visit | Attending: Orthopaedic Surgery | Admitting: Orthopaedic Surgery

## 2023-10-27 ENCOUNTER — Other Ambulatory Visit: Payer: Self-pay

## 2023-10-27 VITALS — BP 142/83 | HR 84 | Temp 99.1°F | Resp 20 | Ht 61.0 in | Wt 140.0 lb

## 2023-10-27 DIAGNOSIS — Z96611 Presence of right artificial shoulder joint: Secondary | ICD-10-CM | POA: Diagnosis not present

## 2023-10-27 DIAGNOSIS — M1712 Unilateral primary osteoarthritis, left knee: Secondary | ICD-10-CM | POA: Insufficient documentation

## 2023-10-27 DIAGNOSIS — Z79899 Other long term (current) drug therapy: Secondary | ICD-10-CM | POA: Insufficient documentation

## 2023-10-27 DIAGNOSIS — Z01812 Encounter for preprocedural laboratory examination: Secondary | ICD-10-CM | POA: Insufficient documentation

## 2023-10-27 DIAGNOSIS — Z01818 Encounter for other preprocedural examination: Secondary | ICD-10-CM | POA: Diagnosis not present

## 2023-10-27 HISTORY — DX: Essential (primary) hypertension: I10

## 2023-10-27 HISTORY — DX: Anemia, unspecified: D64.9

## 2023-10-27 LAB — CBC
HCT: 36.8 % (ref 36.0–46.0)
Hemoglobin: 11.3 g/dL — ABNORMAL LOW (ref 12.0–15.0)
MCH: 29.4 pg (ref 26.0–34.0)
MCHC: 30.7 g/dL (ref 30.0–36.0)
MCV: 95.8 fL (ref 80.0–100.0)
Platelets: 324 10*3/uL (ref 150–400)
RBC: 3.84 MIL/uL — ABNORMAL LOW (ref 3.87–5.11)
RDW: 12.6 % (ref 11.5–15.5)
WBC: 6.9 10*3/uL (ref 4.0–10.5)
nRBC: 0 % (ref 0.0–0.2)

## 2023-10-27 LAB — COMPREHENSIVE METABOLIC PANEL WITH GFR
ALT: 11 U/L (ref 0–44)
AST: 23 U/L (ref 15–41)
Albumin: 3.6 g/dL (ref 3.5–5.0)
Alkaline Phosphatase: 87 U/L (ref 38–126)
Anion gap: 11 (ref 5–15)
BUN: 12 mg/dL (ref 8–23)
CO2: 24 mmol/L (ref 22–32)
Calcium: 9.2 mg/dL (ref 8.9–10.3)
Chloride: 102 mmol/L (ref 98–111)
Creatinine, Ser: 0.61 mg/dL (ref 0.44–1.00)
GFR, Estimated: >60 mL/min (ref >60)
Glucose, Bld: 109 mg/dL — ABNORMAL HIGH (ref 70–99)
Potassium: 3.4 mmol/L — ABNORMAL LOW (ref 3.5–5.1)
Sodium: 137 mmol/L (ref 135–145)
Total Bilirubin: 0.8 mg/dL (ref 0.0–1.2)
Total Protein: 7.6 g/dL (ref 6.5–8.1)

## 2023-10-27 LAB — SURGICAL PCR SCREEN
MRSA, PCR: NEGATIVE
Staphylococcus aureus: NEGATIVE

## 2023-10-31 NOTE — Anesthesia Preprocedure Evaluation (Addendum)
 Anesthesia Evaluation  Patient identified by MRN, date of birth, ID band Patient awake    Reviewed: Allergy  & Precautions, NPO status , Patient's Chart, lab work & pertinent test results  History of Anesthesia Complications (+) PONV and history of anesthetic complications  Airway Mallampati: III  TM Distance: >3 FB Neck ROM: Limited   Comment: Previous grade I view with MAC 3, easy mask Dental  (+) Dental Advisory Given, Missing   Pulmonary neg shortness of breath, neg sleep apnea, neg COPD, neg recent URI, former smoker   Pulmonary exam normal breath sounds clear to auscultation       Cardiovascular hypertension, (-) angina (-) Past MI, (-) Cardiac Stents and (-) CABG (-) dysrhythmias  Rhythm:Regular Rate:Normal     Neuro/Psych neg Seizures  Neuromuscular disease (s/p cervical fusion 2009)    GI/Hepatic Neg liver ROS,GERD  ,,  Endo/Other  neg diabetesHypothyroidism    Renal/GU negative Renal ROS     Musculoskeletal  (+) Arthritis , Osteoarthritis and Rheumatoid disorders,    Abdominal   Peds  Hematology  (+) Blood dyscrasia, anemia Lab Results      Component                Value               Date                      WBC                      6.9                 10/27/2023                HGB                      11.3 (L)            10/27/2023                HCT                      36.8                10/27/2023                MCV                      95.8                10/27/2023                PLT                      324                 10/27/2023              Anesthesia Other Findings   Reproductive/Obstetrics H/o right breast cancer 2016                             Anesthesia Physical Anesthesia Plan  ASA: 3  Anesthesia Plan: MAC and Spinal   Post-op Pain Management: Regional block* and Tylenol  PO (pre-op)*   Induction: Intravenous  PONV Risk Score and Plan: 3 and  Ondansetron , Dexamethasone , Propofol  infusion, TIVA and Treatment may vary  due to age or medical condition  Airway Management Planned: Natural Airway and Simple Face Mask  Additional Equipment:   Intra-op Plan:   Post-operative Plan:   Informed Consent: I have reviewed the patients History and Physical, chart, labs and discussed the procedure including the risks, benefits and alternatives for the proposed anesthesia with the patient or authorized representative who has indicated his/her understanding and acceptance.     Dental advisory given  Plan Discussed with: Anesthesiologist and CRNA  Anesthesia Plan Comments: (Discussed potential risks of nerve blocks including, but not limited to, infection, bleeding, nerve damage, seizures, pneumothorax, respiratory depression, and potential failure of the block. Alternatives to nerve blocks discussed. All questions answered.  I have discussed risks of neuraxial anesthesia including but not limited to infection, bleeding, nerve injury, back pain, headache, seizures, and failure of block. Patient denies bleeding disorders and is not currently anticoagulated. Labs have been reviewed. Risks and benefits discussed. All patient's questions answered.   Discussed with patient risks of MAC including, but not limited to, minor pain or discomfort, hearing people in the room, and possible need for backup general anesthesia. Risks for general anesthesia also discussed including, but not limited to, sore throat, hoarse voice, chipped/damaged teeth, injury to vocal cords, nausea and vomiting, allergic reactions, lung infection, heart attack, stroke, and death. All questions answered. )       Anesthesia Quick Evaluation

## 2023-10-31 NOTE — H&P (Signed)
 TOTAL KNEE ADMISSION H&P  Patient is being admitted for left total knee arthroplasty.  Subjective:  Chief Complaint:left knee pain.  HPI: Evelyn Orr , 78 y.o. female, has a history of pain and functional disability in the left knee due to arthritis and has failed non-surgical conservative treatments for greater than 12 weeks to includeNSAID's and/or analgesics, corticosteriod injections, viscosupplementation injections, flexibility and strengthening excercises, use of assistive devices, weight reduction as appropriate, and activity modification.  Onset of symptoms was gradual, starting 5 years ago with gradually worsening course since that time. The patient noted no past surgery on the left knee(s).  Patient currently rates pain in the left knee(s) at 10 out of 10 with activity. Patient has night pain, worsening of pain with activity and weight bearing, pain that interferes with activities of daily living, crepitus, and joint swelling.  Patient has evidence of subchondral cysts, subchondral sclerosis, periarticular osteophytes, and joint space narrowing by imaging studies. There is no active infection.  Patient Active Problem List   Diagnosis Date Noted   Hx of colonic polyps 09/15/2023   Unintentional weight loss 09/11/2023   Anemia 09/11/2023   Heme positive stool 09/11/2023   History of breast cancer 06/02/2023   Encounter for general adult medical examination w/o abnormal findings 05/19/2023   Rash and nonspecific skin eruption 12/13/2022   Other long term (current) drug therapy 08/05/2021   Osteoarthritis 08/05/2021   Vitamin D  deficiency 05/13/2021   Hypothyroidism 05/13/2021   Primary hypothyroidism 05/07/2021   Influenza vaccination declined 05/07/2021   S/P reverse total shoulder arthroplasty, 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   Routine general medical examination at a health care facility 03/11/2018   Upper airway cough syndrome 09/22/2017   Overweight (BMI 25.0-29.9) 06/23/2016   S/P  right TKA 06/21/2016   Osteopenia 06/05/2015   Genetic testing 04/14/2015   Family history of breast cancer 04/01/2015   Intraductal carcinoma in situ of breast 03/20/2015   Ductal carcinoma in situ of breast 03/20/2015   Flatfoot 10/11/2013   Achilles tendinitis 07/31/2013   Metatarsal deformity 07/31/2013   Pain in lower limb 07/31/2013   Rheumatoid arthritis (HCC) 03/17/2012   Thyroid  disease 03/17/2012   Past Medical History:  Diagnosis Date   Anemia    Arthritis    generalized   Breast cancer (HCC)    Breast cancer of upper-outer quadrant of right female breast (HCC) 03/20/2015   Cataract    Cough    GERD (gastroesophageal reflux disease)    uses PRN meds/with certain foods   Hypertension    Hypothyroidism    Nasal congestion    PONV (postoperative nausea and vomiting)    Rheumatoid arthritis(714.0)    Seasonal allergies    Thyroid  disease    hypothyroidism    Past Surgical History:  Procedure Laterality Date   BREAST LUMPECTOMY WITH RADIOACTIVE SEED LOCALIZATION Right 04/09/2015   Procedure: BREAST LUMPECTOMY WITH RADIOACTIVE SEED LOCALIZATION;  Surgeon: Enid Harry, MD;  Location: [facility];  Service: General;  Laterality: Right;   BREAST REDUCTION SURGERY  2001   CALCANEAL OSTEOTOMY Left 10/11/2013   Procedure: LEFT CALCANEAL OSTEOTOMY;  Surgeon: Amada Backer, MD;  Location: [facility];  Service: Orthopedics;  Laterality: Left;   CARPAL TUNNEL RELEASE     left   CERVICAL FUSION  2009   posterior C5-6   COLONOSCOPY     2002,2005,2008 w/Brodie   COLONOSCOPY  2015   Brodie-MAC-prep good-TA- recall 5 yrs   GASTROCNEMIUS RECESSION Left 10/11/2013  Procedure: LEFT GASTROC RECESSION; LEFT POSTERIOR TIBIAL TENOLYSIS;  Surgeon: Amada Backer, MD;  Location: [facility];  Service: Orthopedics;  Laterality: Left;   KNEE ARTHROSCOPY  2004   right   POLYPECTOMY     REMOVAL OF IMPLANT Left 02/14/2014   Procedure: LEFT  FOOT REMOVAL OF DEEP IMPLANT;  Surgeon: Amada Backer, MD;  Location: [facility];  Service: Orthopedics;  Laterality: Left;   REVERSE SHOULDER ARTHROPLASTY Right 03/05/2021   Procedure: REVERSE SHOULDER ARTHROPLASTY;  Surgeon: Sammye Cristal, MD;  Location: [facility];  Service: Orthopedics;  Laterality: Right;   SHOULDER ARTHROSCOPY W/ ROTATOR CUFF REPAIR  2006   right   THYROIDECTOMY     age 78   TONSILLECTOMY     TOTAL KNEE ARTHROPLASTY Right 06/21/2016   Procedure: RIGHT TOTAL KNEE ARTHROPLASTY;  Surgeon: Claiborne Crew, MD;  Location: [facility];  Service: Orthopedics;  Laterality: Right;   TUBAL LIGATION     WRIST ARTHRODESIS  2003   left     No current facility-administered medications for this encounter.   Current Outpatient Medications  Medication Sig Dispense Refill Last Dose/Taking   albuterol  (VENTOLIN  HFA) 108 (90 Base) MCG/ACT inhaler TAKE 2 PUFFS BY MOUTH EVERY 6 HOURS AS NEEDED FOR WHEEZE OR SHORTNESS OF BREATH 18 each 1 Taking   cetirizine  (ZYRTEC  ALLERGY ) 10 MG tablet Take 1 tablet (10 mg total) by mouth daily.   Taking   Ferric Maltol  (ACCRUFER ) 30 MG CAPS Take 1 capsule (30 mg total) by mouth daily. 90 capsule 1 Taking   folic acid (FOLVITE) 1 MG tablet Take 1 mg by mouth daily.   Taking   leflunomide (ARAVA) 20 MG tablet Take 20 mg by mouth daily.   Taking   levothyroxine  (SYNTHROID ) 88 MCG tablet Take one tablet by mouth once daily 90 tablet 0 Taking   montelukast  (SINGULAIR ) 10 MG tablet TAKE 1 TABLET BY MOUTH EVERY DAY 90 tablet 2 Taking   sulfaSALAzine  (AZULFIDINE ) 500 MG tablet Take 1,000 mg by mouth 2 (two) times daily.   Taking   mometasone  (NASONEX ) 50 MCG/ACT nasal spray Place 2 sprays into the nose daily. (Patient not taking: Reported on 10/25/2023) 17 g 2 Not Taking   triamcinolone  cream (KENALOG ) 0.1 % Apply 1 Application topically 2 (two) times daily. (Patient not taking: Reported on 10/25/2023) 30 g 0 Not Taking   No Known Allergies  Social History    Tobacco Use   Smoking status: Former    Current packs/day: 0.00    Average packs/day: 0.3 packs/day for 21.0 years (5.3 ttl pk-yrs)    Types: Cigarettes    Start date: 02/13/1963    Quit date: 02/13/1984    Years since quitting: 39.7   Smokeless tobacco: Never  Substance Use Topics   Alcohol use: No    Family History  Problem Relation Age of Onset   Colon polyps Mother    Hypertension Mother    Heart disease Father    Heart attack Father    Other Sister        high breast density   Colon polyps Brother    Prostate cancer Brother 60       unknown Gleason   Colon polyps Brother    Prostate cancer Maternal Uncle    Prostate cancer Maternal Uncle    Prostate cancer Maternal Uncle    Breast cancer Paternal Aunt        dx. 35 or younger   Colon cancer Paternal Uncle  Prostate cancer Maternal Grandfather        dx. older age   Colon polyps Son 12       colonoscopy for unspecified reason; found some colon polyps, has not had one since   Breast cancer Cousin        4 maternal cousin had breast cancer    Colon cancer Other    Cancer Other        MGM's sister   Rectal cancer Neg Hx    Stomach cancer Neg Hx    Esophageal cancer Neg Hx      Review of Systems  Musculoskeletal:  Positive for arthralgias.       Left knee  All other systems reviewed and are negative.   Objective:  Physical Exam Constitutional:      Appearance: Normal appearance.  HENT:     Head: Normocephalic and atraumatic.     Nose: Nose normal.     Mouth/Throat:     Pharynx: Oropharynx is clear.  Eyes:     Extraocular Movements: Extraocular movements intact.  Pulmonary:     Effort: Pulmonary effort is normal.  Abdominal:     Palpations: Abdomen is soft.  Musculoskeletal:     Cervical back: Normal range of motion.     Comments: Left knee motion is only about 0-95.  She has more medial than lateral pain with trace effusion.  Calf is soft and non-tender.  There are no scars about her knee.  Hip  motion is good.  She has normal unlabored respirations.    Skin:    General: Skin is warm and dry.  Neurological:     General: No focal deficit present.     Mental Status: She is alert and oriented to person, place, and time. Mental status is at baseline.  Psychiatric:        Mood and Affect: Mood normal.        Behavior: Behavior normal.        Thought Content: Thought content normal.        Judgment: Judgment normal.     Vital signs in last 24 hours:    Labs:   Estimated body mass index is 26.45 kg/m as calculated from the following:   Height as of 10/27/23: 5\' 1"  (1.549 m).   Weight as of 10/27/23: 63.5 kg.   Imaging Review Plain radiographs demonstrate severe degenerative joint disease of the left knee(s). The overall alignment isneutral. The bone quality appears to be good for age and reported activity level.      Assessment/Plan:  End stage primary arthritis, left knee   The patient history, physical examination, clinical judgment of the provider and imaging studies are consistent with end stage degenerative joint disease of the left knee(s) and total knee arthroplasty is deemed medically necessary. The treatment options including medical management, injection therapy arthroscopy and arthroplasty were discussed at length. The risks and benefits of total knee arthroplasty were presented and reviewed. The risks due to aseptic loosening, infection, stiffness, patella tracking problems, thromboembolic complications and other imponderables were discussed. The patient acknowledged the explanation, agreed to proceed with the plan and consent was signed. Patient is being admitted for inpatient treatment for surgery, pain control, PT, OT, prophylactic antibiotics, VTE prophylaxis, progressive ambulation and ADL's and discharge planning. The patient is planning to be discharged home with home health services   Patient's anticipated LOS is less than 2 midnights, meeting these  requirements: - Younger than 6 - Lives within 1  hour of care - Has a competent adult at home to recover with post-op recover - NO history of  - Chronic pain requiring opiods  - Diabetes  - Coronary Artery Disease  - Heart failure  - Heart attack  - Stroke  - DVT/VTE  - Cardiac arrhythmia  - Respiratory Failure/COPD  - Renal failure  - Anemia  - Advanced Liver disease

## 2023-11-01 ENCOUNTER — Encounter (HOSPITAL_COMMUNITY)
Admission: RE | Disposition: A | Discharge status: Home / Self Care | Payer: Self-pay | Source: Home / Self Care | Attending: Orthopaedic Surgery

## 2023-11-01 ENCOUNTER — Ambulatory Visit (HOSPITAL_COMMUNITY): Admitting: Certified Registered"

## 2023-11-01 ENCOUNTER — Observation Stay (HOSPITAL_COMMUNITY)
Admission: RE | Admit: 2023-11-01 | Discharge: 2023-11-02 | Disposition: A | Discharge status: Home / Self Care | Attending: Orthopaedic Surgery | Admitting: Orthopaedic Surgery

## 2023-11-01 ENCOUNTER — Other Ambulatory Visit: Payer: Self-pay

## 2023-11-01 ENCOUNTER — Encounter (HOSPITAL_COMMUNITY): Payer: Self-pay | Admitting: Orthopaedic Surgery

## 2023-11-01 DIAGNOSIS — Z853 Personal history of malignant neoplasm of breast: Secondary | ICD-10-CM | POA: Insufficient documentation

## 2023-11-01 DIAGNOSIS — Z87891 Personal history of nicotine dependence: Secondary | ICD-10-CM | POA: Diagnosis not present

## 2023-11-01 DIAGNOSIS — Z79899 Other long term (current) drug therapy: Secondary | ICD-10-CM | POA: Insufficient documentation

## 2023-11-01 DIAGNOSIS — I10 Essential (primary) hypertension: Secondary | ICD-10-CM | POA: Diagnosis not present

## 2023-11-01 DIAGNOSIS — E039 Hypothyroidism, unspecified: Secondary | ICD-10-CM | POA: Insufficient documentation

## 2023-11-01 DIAGNOSIS — M1712 Unilateral primary osteoarthritis, left knee: Secondary | ICD-10-CM

## 2023-11-01 DIAGNOSIS — Z96651 Presence of right artificial knee joint: Secondary | ICD-10-CM | POA: Diagnosis not present

## 2023-11-01 DIAGNOSIS — G8918 Other acute postprocedural pain: Secondary | ICD-10-CM | POA: Diagnosis not present

## 2023-11-01 DIAGNOSIS — Z96611 Presence of right artificial shoulder joint: Secondary | ICD-10-CM | POA: Diagnosis not present

## 2023-11-01 DIAGNOSIS — Z96652 Presence of left artificial knee joint: Secondary | ICD-10-CM | POA: Diagnosis not present

## 2023-11-01 HISTORY — PX: TOTAL KNEE ARTHROPLASTY: SHX125

## 2023-11-01 SURGERY — ARTHROPLASTY, KNEE, TOTAL
Anesthesia: Monitor Anesthesia Care | Site: Knee | Laterality: Left

## 2023-11-01 MED ORDER — BUPIVACAINE IN DEXTROSE 0.75-8.25 % IT SOLN
INTRATHECAL | Status: DC | PRN
  Administered 2023-11-01: 1.6 mL via INTRATHECAL

## 2023-11-01 MED ORDER — BISACODYL 5 MG PO TBEC: 5.0000 mg | DELAYED_RELEASE_TABLET | Freq: Every day | ORAL | Status: DC | PRN

## 2023-11-01 MED ORDER — TRANEXAMIC ACID-NACL 1000-0.7 MG/100ML-% IV SOLN
1000.0000 mg | Freq: Once | INTRAVENOUS | Status: AC
  Administered 2023-11-01: 1000 mg via INTRAVENOUS
  Filled 2023-11-01: qty 100

## 2023-11-01 MED ORDER — ACETAMINOPHEN 500 MG PO TABS
500.0000 mg | ORAL_TABLET | Freq: Four times a day (QID) | ORAL | Status: DC
  Administered 2023-11-01 – 2023-11-02 (×3): 500 mg via ORAL
  Filled 2023-11-01 (×4): qty 1

## 2023-11-01 MED ORDER — ONDANSETRON HCL 4 MG/2ML IJ SOLN
INTRAMUSCULAR | Status: DC | PRN
Start: 2023-11-01 — End: 2023-11-01
  Administered 2023-11-01: 4 mg via INTRAVENOUS

## 2023-11-01 MED ORDER — TRANEXAMIC ACID 1000 MG/10ML IV SOLN
INTRAVENOUS | Status: DC | PRN
  Administered 2023-11-01: 2000 mg via TOPICAL

## 2023-11-01 MED ORDER — HYDROCODONE-ACETAMINOPHEN 7.5-325 MG PO TABS
1.0000 | ORAL_TABLET | ORAL | Status: DC | PRN
  Administered 2023-11-01: 1 via ORAL
  Administered 2023-11-02: 2 via ORAL
  Filled 2023-11-01: qty 1
  Filled 2023-11-01: qty 2

## 2023-11-01 MED ORDER — BUPIVACAINE LIPOSOME 1.3 % IJ SUSP
INTRAMUSCULAR | Status: AC
  Filled 2023-11-01: qty 20

## 2023-11-01 MED ORDER — METOCLOPRAMIDE HCL 5 MG/ML IJ SOLN: 5.0000 mg | Freq: Three times a day (TID) | INTRAMUSCULAR | Status: DC | PRN

## 2023-11-01 MED ORDER — LIDOCAINE HCL (PF) 2 % IJ SOLN
INTRAMUSCULAR | Status: AC
  Filled 2023-11-01: qty 5

## 2023-11-01 MED ORDER — BUPIVACAINE-EPINEPHRINE 0.5% -1:200000 IJ SOLN
INTRAMUSCULAR | Status: DC | PRN
  Administered 2023-11-01: 30 mL

## 2023-11-01 MED ORDER — LEVOTHYROXINE SODIUM 88 MCG PO TABS
88.0000 ug | ORAL_TABLET | Freq: Every day | ORAL | Status: DC
  Administered 2023-11-02: 88 ug via ORAL
  Filled 2023-11-01: qty 1

## 2023-11-01 MED ORDER — ACETAMINOPHEN 500 MG PO TABS
1000.0000 mg | ORAL_TABLET | Freq: Once | ORAL | Status: AC
  Administered 2023-11-01: 1000 mg via ORAL
  Filled 2023-11-01: qty 2

## 2023-11-01 MED ORDER — PROPOFOL 500 MG/50ML IV EMUL
INTRAVENOUS | Status: DC | PRN
  Administered 2023-11-01: 40 ug/kg/min via INTRAVENOUS

## 2023-11-01 MED ORDER — OXYCODONE HCL 5 MG PO TABS: 5.0000 mg | ORAL_TABLET | Freq: Once | ORAL | Status: DC | PRN

## 2023-11-01 MED ORDER — ONDANSETRON HCL 4 MG/2ML IJ SOLN: 4.0000 mg | Freq: Four times a day (QID) | INTRAMUSCULAR | Status: DC | PRN

## 2023-11-01 MED ORDER — MIDAZOLAM HCL 2 MG/2ML IJ SOLN: 1.0000 mg | INTRAMUSCULAR | Status: DC | PRN

## 2023-11-01 MED ORDER — ASPIRIN 81 MG PO CHEW
81.0000 mg | CHEWABLE_TABLET | Freq: Two times a day (BID) | ORAL | Status: DC
  Administered 2023-11-02: 81 mg via ORAL
  Filled 2023-11-01: qty 1

## 2023-11-01 MED ORDER — TRANEXAMIC ACID-NACL 1000-0.7 MG/100ML-% IV SOLN
1000.0000 mg | INTRAVENOUS | Status: AC
  Administered 2023-11-01: 1000 mg via INTRAVENOUS
  Filled 2023-11-01: qty 100

## 2023-11-01 MED ORDER — POVIDONE-IODINE 7.5 % EX SOLN: Freq: Once | CUTANEOUS | Status: DC

## 2023-11-01 MED ORDER — SULFASALAZINE 500 MG PO TABS
1000.0000 mg | ORAL_TABLET | Freq: Two times a day (BID) | ORAL | Status: DC
  Administered 2023-11-01 – 2023-11-02 (×2): 1000 mg via ORAL
  Filled 2023-11-01 (×2): qty 2

## 2023-11-01 MED ORDER — PHENOL 1.4 % MT LIQD: 1.0000 | OROMUCOSAL | Status: DC | PRN

## 2023-11-01 MED ORDER — LEFLUNOMIDE 10 MG PO TABS
20.0000 mg | ORAL_TABLET | Freq: Every day | ORAL | Status: DC
  Administered 2023-11-01 – 2023-11-02 (×2): 20 mg via ORAL
  Filled 2023-11-01 (×2): qty 2

## 2023-11-01 MED ORDER — DEXAMETHASONE SODIUM PHOSPHATE 10 MG/ML IJ SOLN
INTRAMUSCULAR | Status: AC
  Filled 2023-11-01: qty 1

## 2023-11-01 MED ORDER — SODIUM CHLORIDE (PF) 0.9 % IJ SOLN
INTRAMUSCULAR | Status: AC
  Filled 2023-11-01: qty 30

## 2023-11-01 MED ORDER — LIDOCAINE HCL (CARDIAC) PF 100 MG/5ML IV SOSY
PREFILLED_SYRINGE | INTRAVENOUS | Status: DC | PRN
  Administered 2023-11-01: 40 mg via INTRAVENOUS

## 2023-11-01 MED ORDER — PROPOFOL 10 MG/ML IV BOLUS
INTRAVENOUS | Status: AC
  Filled 2023-11-01: qty 20

## 2023-11-01 MED ORDER — LACTATED RINGERS IV SOLN: INTRAVENOUS | Status: DC

## 2023-11-01 MED ORDER — FOLIC ACID 1 MG PO TABS
1.0000 mg | ORAL_TABLET | Freq: Every day | ORAL | Status: DC
  Administered 2023-11-01 – 2023-11-02 (×2): 1 mg via ORAL
  Filled 2023-11-01 (×2): qty 1

## 2023-11-01 MED ORDER — MONTELUKAST SODIUM 10 MG PO TABS
10.0000 mg | ORAL_TABLET | Freq: Every day | ORAL | Status: DC
  Administered 2023-11-01 – 2023-11-02 (×2): 10 mg via ORAL
  Filled 2023-11-01 (×2): qty 1

## 2023-11-01 MED ORDER — ALBUTEROL SULFATE (2.5 MG/3ML) 0.083% IN NEBU: 2.5000 mg | INHALATION_SOLUTION | Freq: Four times a day (QID) | RESPIRATORY_TRACT | Status: DC | PRN

## 2023-11-01 MED ORDER — PHENYLEPHRINE 80 MCG/ML (10ML) SYRINGE FOR IV PUSH (FOR BLOOD PRESSURE SUPPORT)
PREFILLED_SYRINGE | INTRAVENOUS | Status: DC | PRN
  Administered 2023-11-01 (×7): 80 ug via INTRAVENOUS

## 2023-11-01 MED ORDER — DIPHENHYDRAMINE HCL 12.5 MG/5ML PO ELIX
12.5000 mg | ORAL_SOLUTION | ORAL | Status: DC | PRN
  Administered 2023-11-02: 12.5 mg via ORAL
  Filled 2023-11-01: qty 10

## 2023-11-01 MED ORDER — DOCUSATE SODIUM 100 MG PO CAPS
100.0000 mg | ORAL_CAPSULE | Freq: Two times a day (BID) | ORAL | Status: DC
  Administered 2023-11-01 – 2023-11-02 (×3): 100 mg via ORAL
  Filled 2023-11-01 (×3): qty 1

## 2023-11-01 MED ORDER — AMISULPRIDE (ANTIEMETIC) 5 MG/2ML IV SOLN
10.0000 mg | Freq: Once | INTRAVENOUS | Status: DC | PRN
Start: 2023-11-01 — End: 2023-11-01

## 2023-11-01 MED ORDER — CEFAZOLIN SODIUM-DEXTROSE 2-4 GM/100ML-% IV SOLN
2.0000 g | INTRAVENOUS | Status: AC
  Administered 2023-11-01: 2 g via INTRAVENOUS
  Filled 2023-11-01: qty 100

## 2023-11-01 MED ORDER — POVIDONE-IODINE 10 % EX SWAB
2.0000 | Freq: Once | CUTANEOUS | Status: AC
Start: 2023-11-01 — End: 2023-11-01
  Administered 2023-11-01: 2 via TOPICAL

## 2023-11-01 MED ORDER — ONDANSETRON HCL 4 MG/2ML IJ SOLN
INTRAMUSCULAR | Status: AC
  Filled 2023-11-01: qty 2

## 2023-11-01 MED ORDER — OXYCODONE HCL 5 MG/5ML PO SOLN: 5.0000 mg | Freq: Once | ORAL | Status: DC | PRN

## 2023-11-01 MED ORDER — PROPOFOL 10 MG/ML IV BOLUS
INTRAVENOUS | Status: DC | PRN
Start: 2023-11-01 — End: 2023-11-01
  Administered 2023-11-01: 10 mg via INTRAVENOUS
  Administered 2023-11-01: 20 mg via INTRAVENOUS
  Administered 2023-11-01: 10 mg via INTRAVENOUS

## 2023-11-01 MED ORDER — ONDANSETRON HCL 4 MG PO TABS: 4.0000 mg | ORAL_TABLET | Freq: Four times a day (QID) | ORAL | Status: DC | PRN

## 2023-11-01 MED ORDER — ROPIVACAINE HCL 5 MG/ML IJ SOLN
INTRAMUSCULAR | Status: DC | PRN
  Administered 2023-11-01: 20 mL via PERINEURAL

## 2023-11-01 MED ORDER — SODIUM CHLORIDE (PF) 0.9 % IJ SOLN
INTRAMUSCULAR | Status: DC | PRN
  Administered 2023-11-01: 30 mL

## 2023-11-01 MED ORDER — FENTANYL CITRATE PF 50 MCG/ML IJ SOSY
50.0000 ug | PREFILLED_SYRINGE | INTRAMUSCULAR | Status: AC | PRN
  Administered 2023-11-01: 50 ug via INTRAVENOUS
  Filled 2023-11-01: qty 2

## 2023-11-01 MED ORDER — ALUM & MAG HYDROXIDE-SIMETH 200-200-20 MG/5ML PO SUSP: 30.0000 mL | ORAL | Status: DC | PRN

## 2023-11-01 MED ORDER — METOCLOPRAMIDE HCL 5 MG PO TABS: 5.0000 mg | ORAL_TABLET | Freq: Three times a day (TID) | ORAL | Status: DC | PRN

## 2023-11-01 MED ORDER — 0.9 % SODIUM CHLORIDE (POUR BTL) OPTIME
TOPICAL | Status: DC | PRN
  Administered 2023-11-01: 1000 mL

## 2023-11-01 MED ORDER — SODIUM CHLORIDE 0.9% IV SOLUTION
INTRAVENOUS | Status: AC | PRN
  Administered 2023-11-01: 1000 mL

## 2023-11-01 MED ORDER — PROPOFOL 1000 MG/100ML IV EMUL
INTRAVENOUS | Status: AC
  Filled 2023-11-01: qty 100

## 2023-11-01 MED ORDER — BUPIVACAINE-EPINEPHRINE (PF) 0.5% -1:200000 IJ SOLN
INTRAMUSCULAR | Status: AC
  Filled 2023-11-01: qty 30

## 2023-11-01 MED ORDER — ACETAMINOPHEN 325 MG PO TABS: 325.0000 mg | ORAL_TABLET | Freq: Four times a day (QID) | ORAL | Status: DC | PRN

## 2023-11-01 MED ORDER — BUPIVACAINE LIPOSOME 1.3 % IJ SUSP
INTRAMUSCULAR | Status: DC | PRN
  Administered 2023-11-01: 20 mL

## 2023-11-01 MED ORDER — ORAL CARE MOUTH RINSE: 15.0000 mL | Freq: Once | OROMUCOSAL | Status: AC

## 2023-11-01 MED ORDER — LORATADINE 10 MG PO TABS
10.0000 mg | ORAL_TABLET | Freq: Every day | ORAL | Status: DC
  Administered 2023-11-01 – 2023-11-02 (×2): 10 mg via ORAL
  Filled 2023-11-01 (×2): qty 1

## 2023-11-01 MED ORDER — TRANEXAMIC ACID 1000 MG/10ML IV SOLN
2000.0000 mg | Freq: Once | INTRAVENOUS | Status: DC
  Filled 2023-11-01: qty 20

## 2023-11-01 MED ORDER — METHOCARBAMOL 500 MG PO TABS
500.0000 mg | ORAL_TABLET | Freq: Four times a day (QID) | ORAL | Status: DC | PRN
  Administered 2023-11-01: 500 mg via ORAL
  Filled 2023-11-01: qty 1

## 2023-11-01 MED ORDER — MORPHINE SULFATE (PF) 2 MG/ML IV SOLN: 0.5000 mg | INTRAVENOUS | Status: DC | PRN

## 2023-11-01 MED ORDER — HYDROCODONE-ACETAMINOPHEN 5-325 MG PO TABS
1.0000 | ORAL_TABLET | ORAL | Status: DC | PRN
  Administered 2023-11-02 (×2): 2 via ORAL
  Filled 2023-11-01 (×2): qty 2

## 2023-11-01 MED ORDER — METHOCARBAMOL 1000 MG/10ML IJ SOLN: 500.0000 mg | Freq: Four times a day (QID) | INTRAMUSCULAR | Status: DC | PRN

## 2023-11-01 MED ORDER — MENTHOL 3 MG MT LOZG: 1.0000 | LOZENGE | OROMUCOSAL | Status: DC | PRN

## 2023-11-01 MED ORDER — KETOROLAC TROMETHAMINE 15 MG/ML IJ SOLN
7.5000 mg | Freq: Four times a day (QID) | INTRAMUSCULAR | Status: AC
  Administered 2023-11-01 – 2023-11-02 (×4): 7.5 mg via INTRAVENOUS
  Filled 2023-11-01 (×4): qty 1

## 2023-11-01 MED ORDER — DEXAMETHASONE SODIUM PHOSPHATE 10 MG/ML IJ SOLN
INTRAMUSCULAR | Status: DC | PRN
  Administered 2023-11-01: 4 mg via INTRAVENOUS

## 2023-11-01 MED ORDER — FENTANYL CITRATE PF 50 MCG/ML IJ SOSY: 25.0000 ug | PREFILLED_SYRINGE | INTRAMUSCULAR | Status: DC | PRN

## 2023-11-01 MED ORDER — CHLORHEXIDINE GLUCONATE 0.12 % MT SOLN
15.0000 mL | Freq: Once | OROMUCOSAL | Status: AC
  Administered 2023-11-01: 15 mL via OROMUCOSAL

## 2023-11-01 MED ORDER — PHENYLEPHRINE 80 MCG/ML (10ML) SYRINGE FOR IV PUSH (FOR BLOOD PRESSURE SUPPORT)
PREFILLED_SYRINGE | INTRAVENOUS | Status: AC
Start: 2023-11-01 — End: ?
  Filled 2023-11-01: qty 20

## 2023-11-01 SURGICAL SUPPLY — 50 items
ATTUNE MED DOME PAT 32 KNEE (Knees) IMPLANT
ATTUNE PSFEM LTSZ4 NARCEM KNEE (Femur) IMPLANT
ATTUNE PSRP INSR SZ4 7 KNEE (Insert) IMPLANT
BAG COUNTER SPONGE SURGICOUNT (BAG) ×1 IMPLANT
BAG DECANTER FOR FLEXI CONT (MISCELLANEOUS) ×1 IMPLANT
BAG ZIPLOCK 12X15 (MISCELLANEOUS) ×1 IMPLANT
BASE TIBIAL ROT PLAT SZ 3 KNEE (Knees) IMPLANT
BLADE SAGITTAL 25.0X1.19X90 (BLADE) ×1 IMPLANT
BLADE SAW SGTL 11.0X1.19X90.0M (BLADE) ×1 IMPLANT
BLADE SURG SZ10 CARB STEEL (BLADE) ×1 IMPLANT
BNDG ELASTIC 6INX 5YD STR LF (GAUZE/BANDAGES/DRESSINGS) ×1 IMPLANT
BNDG ELASTIC 6X10 VLCR STRL LF (GAUZE/BANDAGES/DRESSINGS) IMPLANT
BOOTIES KNEE HIGH SLOAN (MISCELLANEOUS) ×1 IMPLANT
BOWL SMART MIX CTS (DISPOSABLE) ×1 IMPLANT
CEMENT HV SMART SET (Cement) ×2 IMPLANT
COVER SURGICAL LIGHT HANDLE (MISCELLANEOUS) ×1 IMPLANT
CUFF TRNQT CYL 34X4.125X (TOURNIQUET CUFF) ×1 IMPLANT
DRAPE TOP 10253 STERILE (DRAPES) ×1 IMPLANT
DRAPE U-SHAPE 47X51 STRL (DRAPES) ×1 IMPLANT
DRSG AQUACEL AG ADV 3.5X10 (GAUZE/BANDAGES/DRESSINGS) ×1 IMPLANT
DURAPREP 26ML APPLICATOR (WOUND CARE) ×2 IMPLANT
ELECT PENCIL ROCKER SW 15FT (MISCELLANEOUS) ×1 IMPLANT
ELECT REM PT RETURN 15FT ADLT (MISCELLANEOUS) ×1 IMPLANT
GLOVE BIO SURGEON STRL SZ8 (GLOVE) ×2 IMPLANT
GLOVE BIOGEL PI IND STRL 7.0 (GLOVE) ×1 IMPLANT
GLOVE BIOGEL PI IND STRL 8 (GLOVE) ×2 IMPLANT
GLOVE SURG SYN 7.0 (GLOVE) ×1 IMPLANT
GLOVE SURG SYN 7.0 PF PI (GLOVE) ×1 IMPLANT
GOWN SRG XL LVL 4 BRTHBL STRL (GOWNS) ×1 IMPLANT
GOWN STRL REUS W/ TWL XL LVL3 (GOWN DISPOSABLE) ×2 IMPLANT
HOLDER FOLEY CATH W/STRAP (MISCELLANEOUS) IMPLANT
HOOD PEEL AWAY T7 (MISCELLANEOUS) ×3 IMPLANT
KIT TURNOVER KIT A (KITS) IMPLANT
MANIFOLD NEPTUNE II (INSTRUMENTS) ×1 IMPLANT
NS IRRIG 1000ML POUR BTL (IV SOLUTION) ×1 IMPLANT
PACK TOTAL KNEE CUSTOM (KITS) ×1 IMPLANT
PAD ARMBOARD POSITIONER FOAM (MISCELLANEOUS) ×1 IMPLANT
PIN STEINMAN FIXATION KNEE (PIN) IMPLANT
PROTECTOR NERVE ULNAR (MISCELLANEOUS) ×1 IMPLANT
SET HNDPC FAN SPRY TIP SCT (DISPOSABLE) ×1 IMPLANT
SPIKE FLUID TRANSFER (MISCELLANEOUS) ×2 IMPLANT
SUT ETHIBOND NAB CT1 #1 30IN (SUTURE) ×1 IMPLANT
SUT VIC AB 0 CT1 36 (SUTURE) ×2 IMPLANT
SUT VIC AB 2-0 CT1 TAPERPNT 27 (SUTURE) ×1 IMPLANT
SUT VICRYL AB 3-0 FS1 BRD 27IN (SUTURE) ×1 IMPLANT
SUTURE STRATFX 0 PDS 27 VIOLET (SUTURE) ×1 IMPLANT
TRAY FOLEY MTR SLVR 16FR STAT (SET/KITS/TRAYS/PACK) IMPLANT
WATER STERILE IRR 1000ML POUR (IV SOLUTION) ×2 IMPLANT
WRAP KNEE MAXI GEL POST OP (GAUZE/BANDAGES/DRESSINGS) ×1 IMPLANT
YANKAUER SUCT BULB TIP NO VENT (SUCTIONS) ×1 IMPLANT

## 2023-11-01 NOTE — Anesthesia Procedure Notes (Signed)
 Anesthesia Regional Block: Adductor canal block   Pre-Anesthetic Checklist: , timeout performed,  Correct Patient, Correct Site, Correct Laterality,  Correct Procedure, Correct Position, site marked,  Risks and benefits discussed,  Surgical consent,  Pre-op evaluation,  At surgeon's request and post-op pain management  Laterality: Left  Prep: chloraprep       Needles:  Injection technique: Single-shot  Needle Type: Echogenic Stimulator Needle     Needle Length: 9cm  Needle Gauge: 21     Additional Needles:   Procedures:,,,, ultrasound used (permanent image in chart),,    Narrative:  Start time: 11/01/2023 9:12 AM End time: 11/01/2023 9:16 AM Injection made incrementally with aspirations every 5 mL.  Performed by: Personally  Anesthesiologist: Conard Decent, MD  Additional Notes: Discussed risks and benefits of nerve block including, but not limited to, prolonged and/or permanent nerve injury involving sensory and/or motor function. Monitors were applied and a time-out was performed. The nerve and associated structures were visualized under ultrasound guidance. After negative aspiration, local anesthetic was slowly injected around the nerve. There was no evidence of high pressure during the procedure. There were no paresthesias. VSS remained stable and the patient tolerated the procedure well.

## 2023-11-01 NOTE — Op Note (Signed)
 PREOP DIAGNOSIS: DJD LEFT KNEE POSTOP DIAGNOSIS:  same PROCEDURE: LEFT TKR ANESTHESIA: Spinal and MAC ATTENDING SURGEON: Alphonzo Ask ASSISTANT: Lucrezia Sachs PA  INDICATIONS FOR PROCEDURE: Evelyn Orr  is a 78 y.o. female who has struggled for a long time with pain due to degenerative arthritis of the left knee.  The patient has failed many conservative non-operative measures and at this point has pain which limits the ability to sleep and walk.  The patient is offered total knee replacement.  Informed operative consent was obtained after discussion of possible risks of anesthesia, infection, neurovascular injury, DVT, and death.  The importance of the post-operative rehabilitation protocol to optimize result was stressed extensively with the patient.  SUMMARY OF FINDINGS AND PROCEDURE:  Evelyn Orr  was taken to the operative suite where under the above anesthesia a left knee replacement was performed.  There were advanced degenerative changes and the bone quality was good.  We used the DePuyAttune system and placed size 4 narrow femur, 3 tibia, 32 mm all polyethylene patella, and a size 7 mm spacer.  Lucrezia Sachs PA-C assisted throughout and was invaluable to the completion of the case in that he helped retract and maintain exposure while I placed the components.  He also helped close thereby minimizing OR time.  The patient was admitted for appropriate post-op care to include perioperative antibiotics and mechanical and pharmacologic measures for DVT prophylaxis.  DESCRIPTION OF PROCEDURE:  Evelyn Orr  was taken to the operative suite where the above anesthesia was applied.  The patient was positioned supine and prepped and draped in normal sterile fashion.  An appropriate time out was performed.  After the administration of kefzol  pre-op antibiotic the leg was elevated and exsanguinated and a tourniquet inflated.  A standard longitudinal incision was made on the anterior knee.   Dissection was carried down to the extensor mechanism.  All appropriate anti-infective measures were used including the pre-operative antibiotic, betadine impregnated drape, and closed hooded exhaust systems for each member of the surgical team.  A medial parapatellar incision was made in the extensor mechanism and the knee cap flipped and the knee flexed.  Some residual meniscal tissues were removed along with any remaining ACL/PCL tissue.  A guide was placed on the tibia and a flat cut was made on it's superior surface.  An intramedullary guide was placed in the femur and was utilized to make anterior and posterior cuts creating an appropriate flexion gap.  A second intramedullary guide was placed in the femur to make a distal cut properly balancing the knee with an extension gap equal to the flexion gap.  The three bones sized to the above mentioned sizes and the appropriate guides were placed and utilized.  A trial reduction was done and the knee easily came to full extension and the patella tracked well on flexion.  The trial components were removed and all bones were cleaned with pulsatile lavage and then dried thoroughly.  Cement was mixed and was pressurized onto the bones followed by placement of the aforementioned components.  Excess cement was trimmed and pressure was held on the components until the cement had hardened.  The tourniquet was deflated and a small amount of bleeding was controlled with cautery and pressure.  The knee was irrigated thoroughly.  The extensor mechanism was re-approximated with #1 ethibond in interrupted fashion.  The knee was flexed and the repair was solid.  The subcutaneous tissues were re-approximated with #0 and #2-0 vicryl and the  skin closed with a subcuticular stitch and steristrips.  A sterile dressing was applied.  Intraoperative fluids, EBL, and tourniquet time can be obtained from anesthesia records.  DISPOSITION:  The patient was taken to recovery room in stable  condition and scheduled to potentially go home same day depending on ability to walk and tolerate liquids.  Alphonzo Ask 11/01/2023, 11:32 AM

## 2023-11-01 NOTE — Interval H&P Note (Signed)
 History and Physical Interval Note:  11/01/2023 8:56 AM  Evelyn Orr   has presented today for surgery, with the diagnosis of LEFT KNEE DEGENERATIVE JOINT DISEASE.  The various methods of treatment have been discussed with the patient and family. After consideration of risks, benefits and other options for treatment, the patient has consented to  Procedure(s) with comments: ARTHROPLASTY, KNEE, TOTAL (Left) - LEFT TOTAL KNEE ARTHOPLASTY as a surgical intervention.  The patient's history has been reviewed, patient examined, no change in status, stable for surgery.  I have reviewed the patient's chart and labs.  Questions were answered to the patient's satisfaction.     [name]

## 2023-11-01 NOTE — Anesthesia Procedure Notes (Signed)
 Procedure Name: MAC Date/Time: 11/01/2023 10:01 AM  Performed by: Alwyn Juba, CRNAPre-anesthesia Checklist: Patient identified, Emergency Drugs available, Suction available, Patient being monitored and Timeout performed Oxygen Delivery Method: Simple face mask Placement Confirmation: positive ETCO2

## 2023-11-01 NOTE — Evaluation (Signed)
 Physical Therapy Evaluation Patient Details Name: Evelyn Orr  MRN: [id number] DOB: 06-06-1945 Today's Date: 11/01/2023  History of Present Illness  78 yo female presents to therapy s/p L TKA on 10/31/2023 due to failure of conservative measures. Pt PMH includes but is not limited to: anemia, ba ca, OA, hypothyroidism, R reverse TSA (2018), R TKA (2018), and RA.  Clinical Impression    Manreet Kiernan Meggison  is a 78 y.o. female POD 0 s/p L TKA. Patient reports mod I with mobility at baseline. Patient is now limited by functional impairments (see PT problem list below) and requires CGA for bed mobility and CGA for transfers. Patient was able to ambulate 50 feet with RW and CGA level of assist. Patient instructed in exercise to facilitate ROM and circulation to manage edema. Patient will benefit from continued skilled PT interventions to address impairments and progress towards PLOF. Acute PT will follow to progress mobility and stair training in preparation for safe discharge home with family support and OPPT services schedule for 6/6.       If plan is discharge home, recommend the following: A little help with walking and/or transfers;A little help with bathing/dressing/bathroom;Assistance with cooking/housework;Assist for transportation;Help with stairs or ramp for entrance   Can travel by private vehicle        Equipment Recommendations None recommended by PT  Recommendations for Other Services       Functional Status Assessment Patient has had a recent decline in their functional status and demonstrates the ability to make significant improvements in function in a reasonable and predictable amount of time.     Precautions / Restrictions Precautions Precautions: Fall Restrictions Weight Bearing Restrictions Per Provider Order: No      Mobility  Bed Mobility Overal bed mobility: Needs Assistance Bed Mobility: Supine to Sit     Supine to sit: Contact guard, HOB elevated, Used  rails     General bed mobility comments: min cues    Transfers Overall transfer level: Needs assistance Equipment used: Rolling walker (2 wheels) Transfers: Sit to/from Stand Sit to Stand: Contact guard assist           General transfer comment: min cues    Ambulation/Gait Ambulation/Gait assistance: Contact guard assist Gait Distance (Feet): 50 Feet Assistive device: Rolling walker (2 wheels) Gait Pattern/deviations: Step-to pattern, Antalgic, Trunk flexed, Decreased stance time - left Gait velocity: decreased     General Gait Details: B UE support at RW to offload L LE in stance phase, min cues for posture and RW management  Stairs            Wheelchair Mobility     Tilt Bed    Modified Rankin (Stroke Patients Only)       Balance Overall balance assessment: Needs assistance Sitting-balance support: Feet supported Sitting balance-Leahy Scale: Good     Standing balance support: No upper extremity supported, During functional activity, Reliant on assistive device for balance Standing balance-Leahy Scale: Fair                               Pertinent Vitals/Pain Pain Assessment Pain Assessment: 0-10 Pain Score: 6  Pain Location: L knee and LE Pain Descriptors / Indicators: Aching, Constant, Contraction, Discomfort, Dull, Grimacing, Operative site guarding Pain Intervention(s): Limited activity within patient's tolerance, Monitored during session, Premedicated before session, Repositioned, Ice applied    Home Living Family/patient expects to be discharged to:: Private residence Living Arrangements: Spouse/significant  other Available Help at Discharge: Family (granddaughter) Type of Home: House Home Access: Stairs to enter Entrance Stairs-Rails: Right Entrance Stairs-Number of Steps: 3   Home Layout: One level Home Equipment: Agricultural consultant (2 wheels);Cane - single point      Prior Function Prior Level of Function :  Independent/Modified Independent;Driving             Mobility Comments: recent use of SPC over the past 3 wks due to pain. pt mod I for all ADLs, self care tasks, driving [facility]       Extremity/Trunk Assessment        Lower Extremity Assessment Lower Extremity Assessment: LLE deficits/detail LLE Deficits / Details: ankle DF/PF 5/5; SLR < 10 degree lag LLE Sensation: WNL    Cervical / Trunk Assessment Cervical / Trunk Assessment: Normal  Communication   Communication Communication: No apparent difficulties    Cognition Arousal: Alert Behavior During Therapy: WFL for tasks assessed/performed   PT - Cognitive impairments: No apparent impairments                         Following commands: Intact       Cueing       General Comments      Exercises Total Joint Exercises Ankle Circles/Pumps: AROM, Both, 10 reps   Assessment/Plan    PT Assessment Patient needs continued PT services  PT Problem List Decreased strength;Decreased range of motion;Decreased activity tolerance;Decreased balance;Decreased mobility;Pain       PT Treatment Interventions DME instruction;Gait training;Stair training;Functional mobility training;Therapeutic activities;Therapeutic exercise;Balance training;Neuromuscular re-education;Patient/family education;Modalities    PT Goals (Current goals can be found in the Care Plan section)  Acute Rehab PT Goals Patient Stated Goal: to be able to return to the [facility] and do everything I was doing before PT Goal Formulation: With patient Time For Goal Achievement: 11/15/23 Potential to Achieve Goals: Good    Frequency 7X/week     Co-evaluation               AM-PAC PT "6 Clicks" Mobility  Outcome Measure Help needed turning from your back to your side while in a flat bed without using bedrails?: None Help needed moving from lying on your back to sitting on the side of a flat bed without using bedrails?: A  Little Help needed moving to and from a bed to a chair (including a wheelchair)?: A Little Help needed standing up from a chair using your arms (e.g., wheelchair or bedside chair)?: A Little Help needed to walk in hospital room?: A Little Help needed climbing 3-5 steps with a railing? : A Lot 6 Click Score: 18    End of Session Equipment Utilized During Treatment: Gait belt Activity Tolerance: No increased pain;Patient tolerated treatment well Patient left: in chair;with call bell/phone within reach;with chair alarm set Nurse Communication: Mobility status PT Visit Diagnosis: Unsteadiness on feet (R26.81);Other abnormalities of gait and mobility (R26.89);Muscle weakness (generalized) (M62.81);Difficulty in walking, not elsewhere classified (R26.2);Pain Pain - Right/Left: Left Pain - part of body: Knee;Leg    Time: 1093-2355 PT Time Calculation (min) (ACUTE ONLY): 39 min   Charges:   PT Evaluation $PT Eval Low Complexity: 1 Low PT Treatments $Gait Training: 8-22 mins $Therapeutic Activity: 8-22 mins PT General Charges $$ ACUTE PT VISIT: 1 Visit         Cary Clarks, PT Acute Rehab   Annalee Kiang 11/01/2023, 5:48 PM

## 2023-11-01 NOTE — Anesthesia Postprocedure Evaluation (Signed)
 Anesthesia Post Note  Patient: Evelyn Orr   Procedure(s) Performed: ARTHROPLASTY, KNEE, TOTAL (Left: Knee)     Patient location during evaluation: PACU Anesthesia Type: MAC and Spinal Level of consciousness: awake Pain management: pain level controlled Vital Signs Assessment: post-procedure vital signs reviewed and stable Respiratory status: spontaneous breathing, respiratory function stable and nonlabored ventilation Cardiovascular status: blood pressure returned to baseline and stable Postop Assessment: no headache, no backache and no apparent nausea or vomiting Anesthetic complications: no   No notable events documented.  Last Vitals:  Vitals:   11/01/23 1345 11/01/23 1402  BP: (!) 152/83 (!) 144/74  Pulse: 72 71  Resp: 18 15  Temp:  36.4 C  SpO2: 100% 98%    Last Pain:  Vitals:   11/01/23 1402  TempSrc:   PainSc: 0-No pain                 Conard Decent

## 2023-11-01 NOTE — Anesthesia Procedure Notes (Signed)
 Spinal  Patient location during procedure: OR Start time: 11/01/2023 10:00 AM End time: 11/01/2023 10:08 AM Reason for block: surgical anesthesia Staffing Performed: anesthesiologist and resident/CRNA  Anesthesiologist: Conard Decent, MD Resident/CRNA: Alwyn Juba, CRNA Performed by: Conard Decent, MD Authorized by: Conard Decent, MD   Preanesthetic Checklist Completed: patient identified, IV checked, site marked, risks and benefits discussed, surgical consent, monitors and equipment checked, pre-op evaluation and timeout performed Spinal Block Patient position: sitting Prep: DuraPrep Patient monitoring: blood pressure and continuous pulse ox Approach: midline Location: L3-4 Injection technique: single-shot Needle Needle type: Pencan  Needle gauge: 24 G Needle length: 9 cm Additional Notes Risks and benefits of neuraxial anesthesia including, but not limited to, infection, bleeding, local anesthetic toxicity, headache, hypotension, back pain, block failure, etc. were discussed with the patient. The patient expressed understanding and consented to the procedure. I confirmed that the patient has no bleeding disorders and is not taking blood thinners. I confirmed the patient's last platelet count with the nurse. Monitors were applied. A time-out was performed immediately prior to the procedure. Sterile technique was used throughout the whole procedure.   1 attempt(s) by CRNA, unsuccessful. 3 attempts by MDA, third attempt successful.

## 2023-11-01 NOTE — Transfer of Care (Signed)
 Immediate Anesthesia Transfer of Care Note  Patient: Evelyn Orr   Procedure(s) Performed: ARTHROPLASTY, KNEE, TOTAL (Left: Knee)  Patient Location: PACU  Anesthesia Type:Spinal  Level of Consciousness: awake, alert , and oriented  Airway & Oxygen Therapy: Patient Spontanous Breathing and Patient connected to face mask oxygen  Post-op Assessment: Report given to RN  Post vital signs: Reviewed and stable  Last Vitals:  Vitals Value Taken Time  BP 116/85 11/01/23 1200  Temp    Pulse 63 11/01/23 1200  Resp 16 11/01/23 1201  SpO2 93 % 11/01/23 1200  Vitals shown include unfiled device data.  Last Pain:  Vitals:   11/01/23 0925  TempSrc:   PainSc: 0-No pain      Patients Stated Pain Goal: 5 (11/01/23 0802)  Complications: No notable events documented.

## 2023-11-02 DIAGNOSIS — Z79899 Other long term (current) drug therapy: Secondary | ICD-10-CM | POA: Diagnosis not present

## 2023-11-02 DIAGNOSIS — Z853 Personal history of malignant neoplasm of breast: Secondary | ICD-10-CM | POA: Diagnosis not present

## 2023-11-02 DIAGNOSIS — Z96611 Presence of right artificial shoulder joint: Secondary | ICD-10-CM | POA: Diagnosis not present

## 2023-11-02 DIAGNOSIS — I10 Essential (primary) hypertension: Secondary | ICD-10-CM | POA: Diagnosis not present

## 2023-11-02 DIAGNOSIS — Z96651 Presence of right artificial knee joint: Secondary | ICD-10-CM | POA: Diagnosis not present

## 2023-11-02 DIAGNOSIS — Z87891 Personal history of nicotine dependence: Secondary | ICD-10-CM | POA: Diagnosis not present

## 2023-11-02 DIAGNOSIS — E039 Hypothyroidism, unspecified: Secondary | ICD-10-CM | POA: Diagnosis not present

## 2023-11-02 DIAGNOSIS — M1712 Unilateral primary osteoarthritis, left knee: Secondary | ICD-10-CM | POA: Diagnosis not present

## 2023-11-02 MED ORDER — TIZANIDINE HCL 2 MG PO TABS: 2.0000 mg | ORAL_TABLET | Freq: Four times a day (QID) | ORAL | 1 refills | Status: AC | PRN

## 2023-11-02 MED ORDER — HYDROCODONE-ACETAMINOPHEN 5-325 MG PO TABS: 1.0000 | ORAL_TABLET | Freq: Four times a day (QID) | ORAL | 0 refills | Status: AC | PRN

## 2023-11-02 MED ORDER — ASPIRIN 81 MG PO TBEC
81.0000 mg | DELAYED_RELEASE_TABLET | Freq: Two times a day (BID) | ORAL | 0 refills | Status: AC
Start: 2023-11-02 — End: 2024-11-01

## 2023-11-02 NOTE — TOC Transition Note (Signed)
 Transition of Care [facility] - Discharge Note   Patient Details  Name: Evelyn Orr  MRN: [id number] Date of Birth: 07-May-1946  Transition of [facility]) CM/SW Contact:  Delilah Fend, LCSW Phone Number: 11/02/2023, 9:49 AM   Clinical Narrative:    Met with pt who confirms she has needed DME in the home.  OPPT already arranged with [facility].  No further TOC needs.   Final next level of care: OP Rehab Barriers to Discharge: No Barriers Identified   Patient Goals and CMS Choice Patient states their goals for this hospitalization and ongoing recovery are:: return home          Discharge Placement                       Discharge Plan and Services Additional resources added to the After Visit Summary for                  DME Arranged: N/A DME Agency: NA                  Social Drivers of Health (SDOH) Interventions SDOH Screenings   Food Insecurity: No Food Insecurity (11/01/2023)  Housing: Low Risk  (11/01/2023)  Transportation Needs: No Transportation Needs (11/01/2023)  Utilities: Not At Risk (11/01/2023)  Alcohol Screen: Low Risk  (07/29/2023)  Depression (PHQ2-9): Low Risk  (08/31/2023)  Financial Resource Strain: Low Risk  (07/29/2023)  Physical Activity: Sufficiently Active (07/29/2023)  Social Connections: Socially Integrated (11/01/2023)  Stress: No Stress Concern Present (07/29/2023)  Tobacco Use: Medium Risk (11/01/2023)  Health Literacy: Adequate Health Literacy (07/29/2023)     Readmission Risk Interventions     No data to display

## 2023-11-02 NOTE — Plan of Care (Signed)
   Problem: Education: Goal: Knowledge of General Education information will improve Description Including pain rating scale, medication(s)/side effects and non-pharmacologic comfort measures Outcome: Progressing

## 2023-11-02 NOTE — Progress Notes (Signed)
 Subjective: 1 Day Post-Op Procedure(s) (LRB): ARTHROPLASTY, KNEE, TOTAL (Left)  Patient is doing well. She is hoping to go home today.  Activity level:  wbat Diet tolerance:  ok Voiding:  ok Patient reports pain as mild.    Objective: Vital signs in last 24 hours: Temp:  [97.4 F (36.3 C)-98.4 F (36.9 C)] 97.7 F (36.5 C) (06/04 0517) Pulse Rate:  [56-85] 72 (06/04 0517) Resp:  [13-20] 16 (06/04 0517) BP: (116-167)/(72-107) 150/93 (06/04 0517) SpO2:  [93 %-100 %] 96 % (06/04 0517)  Labs: No results for input(s): "HGB" in the last 72 hours. No results for input(s): "WBC", "RBC", "HCT", "PLT" in the last 72 hours. No results for input(s): "NA", "K", "CL", "CO2", "BUN", "CREATININE", "GLUCOSE", "CALCIUM" in the last 72 hours. No results for input(s): "LABPT", "INR" in the last 72 hours.  Physical Exam:  Neurologically intact ABD soft Neurovascular intact Sensation intact distally Intact pulses distally Dorsiflexion/Plantar flexion intact Incision: dressing C/D/I and no drainage No cellulitis present  Assessment/Plan:  1 Day Post-Op Procedure(s) (LRB): ARTHROPLASTY, KNEE, TOTAL (Left) Advance diet Up with therapy D/C IV fluids Discharge home today once cleared by PT and doing well.  Continue on 81mg  asa BID for dvt prevention.  Follow up in office 2 weeks post op.    Evelyn Orr 11/02/2023, 8:19 AM

## 2023-11-02 NOTE — Discharge Summary (Signed)
 Patient ID: Evelyn Orr  MRN: [id number] DOB/AGE: 1946-02-26 78 y.o.  Admit date: 11/01/2023 Discharge date: 11/02/2023  Admission Diagnoses:  Principal Problem:   Primary localized osteoarthritis of left knee Active Problems:   Primary osteoarthritis of left knee   Discharge Diagnoses:  Same  Past Medical History:  Diagnosis Date   Anemia    Arthritis    generalized   Breast cancer (HCC)    Breast cancer of upper-outer quadrant of right female breast (HCC) 03/20/2015   Cataract    Cough    GERD (gastroesophageal reflux disease)    uses PRN meds/with certain foods   Hypertension    Hypothyroidism    Nasal congestion    PONV (postoperative nausea and vomiting)    Rheumatoid arthritis(714.0)    Seasonal allergies    Thyroid  disease    hypothyroidism    Surgeries: Procedure(s): ARTHROPLASTY, KNEE, TOTAL on 11/01/2023   Consultants:   Discharged Condition: Improved  Hospital Course: Evelyn Orr  is an 78 y.o. female who was admitted 11/01/2023 for operative treatment ofPrimary localized osteoarthritis of left knee. Patient has severe unremitting pain that affects sleep, daily activities, and work/hobbies. After pre-op clearance the patient was taken to the operating room on 11/01/2023 and underwent  Procedure(s): ARTHROPLASTY, KNEE, TOTAL.    Patient was given perioperative antibiotics:  Anti-infectives (From admission, onward)    Start     Dose/Rate Route Frequency Ordered Stop   11/01/23 0745  ceFAZolin  (ANCEF ) IVPB 2g/100 mL premix        2 g 200 mL/hr over 30 Minutes Intravenous On call to O.R. 11/01/23 0740 11/01/23 1019        Patient was given sequential compression devices, early ambulation, and chemoprophylaxis to prevent DVT.  Patient benefited maximally from hospital stay and there were no complications.    Recent vital signs: Patient Vitals for the past 24 hrs:  BP Temp Temp src Pulse Resp SpO2  11/02/23 0517 (!) 150/93 97.7 F (36.5 C)  Oral 72 16 96 %  11/02/23 0115 128/79 98.4 F (36.9 C) Oral 75 16 96 %  11/01/23 2125 121/72 97.7 F (36.5 C) Oral 73 16 100 %  11/01/23 1648 (!) 147/75 98.4 F (36.9 C) Oral 85 16 99 %  11/01/23 1432 129/85 97.8 F (36.6 C) -- 71 16 100 %  11/01/23 1402 (!) 144/74 97.6 F (36.4 C) -- 71 15 98 %  11/01/23 1345 (!) 152/83 -- -- 72 18 100 %  11/01/23 1330 (!) 140/80 -- -- 71 18 97 %  11/01/23 1300 136/79 -- -- 65 15 98 %  11/01/23 1245 (!) 148/77 -- -- 68 13 95 %  11/01/23 1230 134/77 -- -- (!) 56 16 100 %  11/01/23 1215 132/73 -- -- 66 13 100 %  11/01/23 1200 116/85 -- -- 63 16 93 %  11/01/23 1158 116/85 (!) 97.4 F (36.3 C) -- -- -- 96 %  11/01/23 0945 131/76 -- -- 76 17 100 %  11/01/23 0930 (!) 167/107 -- -- 76 20 100 %  11/01/23 0925 (!) 146/83 -- -- 75 17 99 %  11/01/23 0920 (!) 140/80 -- -- 77 16 100 %  11/01/23 0915 (!) 145/80 -- -- 77 15 100 %     Recent laboratory studies: No results for input(s): "WBC", "HGB", "HCT", "PLT", "NA", "K", "CL", "CO2", "BUN", "CREATININE", "GLUCOSE", "INR", "CALCIUM" in the last 72 hours.  Invalid input(s): "PT", "2"   Discharge Medications:   Allergies as of 11/02/2023  No Known Allergies      Medication List     TAKE these medications    ACCRUFeR  30 MG Caps Generic drug: Ferric Maltol  Take 1 capsule (30 mg total) by mouth daily.   albuterol  108 (90 Base) MCG/ACT inhaler Commonly known as: VENTOLIN  HFA TAKE 2 PUFFS BY MOUTH EVERY 6 HOURS AS NEEDED FOR WHEEZE OR SHORTNESS OF BREATH   aspirin  EC 81 MG tablet Take 1 tablet (81 mg total) by mouth 2 (two) times daily after a meal. For 2 weeks then once a day for 2 weeks for dvt prevention.   cetirizine  10 MG tablet Commonly known as: ZyrTEC  Allergy  Take 1 tablet (10 mg total) by mouth daily.   folic acid 1 MG tablet Commonly known as: FOLVITE Take 1 mg by mouth daily.   HYDROcodone -acetaminophen  5-325 MG tablet Commonly known as: NORCO/VICODIN Take 1-2 tablets by mouth  every 6 (six) hours as needed for moderate pain (pain score 4-6) or severe pain (pain score 7-10) (post op pain).   leflunomide 20 MG tablet Commonly known as: ARAVA Take 20 mg by mouth daily.   levothyroxine  88 MCG tablet Commonly known as: SYNTHROID  Take one tablet by mouth once daily   mometasone  50 MCG/ACT nasal spray Commonly known as: NASONEX  Place 2 sprays into the nose daily.   montelukast  10 MG tablet Commonly known as: SINGULAIR  TAKE 1 TABLET BY MOUTH EVERY DAY   sulfaSALAzine  500 MG tablet Commonly known as: AZULFIDINE  Take 1,000 mg by mouth 2 (two) times daily.   tiZANidine 2 MG tablet Commonly known as: ZANAFLEX Take 1-2 tablets (2-4 mg total) by mouth every 6 (six) hours as needed for muscle spasms.   triamcinolone  cream 0.1 % Commonly known as: KENALOG  Apply 1 Application topically 2 (two) times daily.               Durable Medical Equipment  (From admission, onward)           Start     Ordered   11/01/23 1437  DME Walker rolling  Once       Question:  Patient needs a walker to treat with the following condition  Answer:  Primary osteoarthritis of left knee   11/01/23 1436   11/01/23 1437  DME 3 n 1  Once        11/01/23 1436   11/01/23 1437  DME Bedside commode  Once       Question:  Patient needs a bedside commode to treat with the following condition  Answer:  Primary osteoarthritis of left knee   11/01/23 1436            Diagnostic Studies: DG Chest 2 View Result Date: 10/27/2023 CLINICAL DATA:  Preop preop coming left knee surgery. EXAM: CHEST - 2 VIEW COMPARISON:  03/04/2021 FINDINGS: The cardiomediastinal contours are normal. The lungs are clear. Pulmonary vasculature is normal. No consolidation, pleural effusion, or pneumothorax. No acute osseous abnormalities are seen. Reverse right shoulder arthroplasty. Surgical hardware in the lower cervical spine. Right chest wall surgical clips IMPRESSION: No active cardiopulmonary disease.  Electronically Signed   By: Chadwick Colonel M.D.   On: 10/27/2023 23:06    Disposition: Discharge disposition: 01-Home or Self Care       Discharge Instructions     Call MD / Call 911   Complete by: As directed    If you experience chest pain or shortness of breath, CALL 911 and be transported to the hospital emergency room.  If you develope a fever above 101 F, pus (white drainage) or increased drainage or redness at the wound, or calf pain, call your surgeon's office.   Constipation Prevention   Complete by: As directed    Drink plenty of fluids.  Prune juice may be helpful.  You may use a stool softener, such as Colace (over the counter) 100 mg twice a day.  Use MiraLax  (over the counter) for constipation as needed.   Diet - low sodium heart healthy   Complete by: As directed    Discharge instructions   Complete by: As directed    INSTRUCTIONS AFTER JOINT REPLACEMENT   Remove items at home which could result in a fall. This includes throw rugs or furniture in walking pathways ICE to the affected joint every three hours while awake for 30 minutes at a time, for at least the first 3-5 days, and then as needed for pain and swelling.  Continue to use ice for pain and swelling. You may notice swelling that will progress down to the foot and ankle.  This is normal after surgery.  Elevate your leg when you are not up walking on it.   Continue to use the breathing machine you got in the hospital (incentive spirometer) which will help keep your temperature down.  It is common for your temperature to cycle up and down following surgery, especially at night when you are not up moving around and exerting yourself.  The breathing machine keeps your lungs expanded and your temperature down.   DIET:  As you were doing prior to hospitalization, we recommend a well-balanced diet.  DRESSING / WOUND CARE / SHOWERING  You may shower 3 days after surgery, but keep the wounds dry during showering.   You may use an occlusive plastic wrap (Press'n Seal for example), NO SOAKING/SUBMERGING IN THE BATHTUB.  If the bandage gets wet, change with a clean dry gauze.  If the incision gets wet, pat the wound dry with a clean towel.  ACTIVITY  Increase activity slowly as tolerated, but follow the weight bearing instructions below.   No driving for 6 weeks or until further direction given by your physician.  You cannot drive while taking narcotics.  No lifting or carrying greater than 10 lbs. until further directed by your surgeon. Avoid periods of inactivity such as sitting longer than an hour when not asleep. This helps prevent blood clots.  You may return to work once you are authorized by your doctor.     WEIGHT BEARING   Weight bearing as tolerated with assist device (walker, cane, etc) as directed, use it as long as suggested by your surgeon or therapist, typically at least 4-6 weeks.   EXERCISES  Results after joint replacement surgery are often greatly improved when you follow the exercise, range of motion and muscle strengthening exercises prescribed by your doctor. Safety measures are also important to protect the joint from further injury. Any time any of these exercises cause you to have increased pain or swelling, decrease what you are doing until you are comfortable again and then slowly increase them. If you have problems or questions, call your caregiver or physical therapist for advice.   Rehabilitation is important following a joint replacement. After just a few days of immobilization, the muscles of the leg can become weakened and shrink (atrophy).  These exercises are designed to build up the tone and strength of the thigh and leg muscles and to improve motion. Often times heat  used for twenty to thirty minutes before working out will loosen up your tissues and help with improving the range of motion but do not use heat for the first two weeks following surgery (sometimes heat can  increase post-operative swelling).   These exercises can be done on a training (exercise) mat, on the floor, on a table or on a bed. Use whatever works the best and is most comfortable for you.    Use music or television while you are exercising so that the exercises are a pleasant break in your day. This will make your life better with the exercises acting as a break in your routine that you can look forward to.   Perform all exercises about fifteen times, three times per day or as directed.  You should exercise both the operative leg and the other leg as well.  Exercises include:   Quad Sets - Tighten up the muscle on the front of the thigh (Quad) and hold for 5-10 seconds.   Straight Leg Raises - With your knee straight (if you were given a brace, keep it on), lift the leg to 60 degrees, hold for 3 seconds, and slowly lower the leg.  Perform this exercise against resistance later as your leg gets stronger.  Leg Slides: Lying on your back, slowly slide your foot toward your buttocks, bending your knee up off the floor (only go as far as is comfortable). Then slowly slide your foot back down until your leg is flat on the floor again.  Angel Wings: Lying on your back spread your legs to the side as far apart as you can without causing discomfort.  Hamstring Strength:  Lying on your back, push your heel against the floor with your leg straight by tightening up the muscles of your buttocks.  Repeat, but this time bend your knee to a comfortable angle, and push your heel against the floor.  You may put a pillow under the heel to make it more comfortable if necessary.   A rehabilitation program following joint replacement surgery can speed recovery and prevent re-injury in the future due to weakened muscles. Contact your doctor or a physical therapist for more information on knee rehabilitation.    CONSTIPATION  Constipation is defined medically as fewer than three stools per week and severe  constipation as less than one stool per week.  Even if you have a regular bowel pattern at home, your normal regimen is likely to be disrupted due to multiple reasons following surgery.  Combination of anesthesia, postoperative narcotics, change in appetite and fluid intake all can affect your bowels.   YOU MUST use at least one of the following options; they are listed in order of increasing strength to get the job done.  They are all available over the counter, and you may need to use some, POSSIBLY even all of these options:    Drink plenty of fluids (prune juice may be helpful) and high fiber foods Colace 100 mg by mouth twice a day  Senokot for constipation as directed and as needed Dulcolax (bisacodyl ), take with full glass of water   Miralax  (polyethylene glycol) once or twice a day as needed.  If you have tried all these things and are unable to have a bowel movement in the first 3-4 days after surgery call either your surgeon or your primary doctor.    If you experience loose stools or diarrhea, hold the medications until you stool forms back up.  If your symptoms  do not get better within 1 week or if they get worse, check with your doctor.  If you experience "the worst abdominal pain ever" or develop nausea or vomiting, please contact the office immediately for further recommendations for treatment.   ITCHING:  If you experience itching with your medications, try taking only a single pain pill, or even half a pain pill at a time.  You can also use Benadryl  over the counter for itching or also to help with sleep.   TED HOSE STOCKINGS:  Use stockings on both legs until for at least 2 weeks or as directed by physician office. They may be removed at night for sleeping.  MEDICATIONS:  See your medication summary on the "After Visit Summary" that nursing will review with you.  You may have some home medications which will be placed on hold until you complete the course of blood thinner  medication.  It is important for you to complete the blood thinner medication as prescribed.  PRECAUTIONS:  If you experience chest pain or shortness of breath - call 911 immediately for transfer to the hospital emergency department.   If you develop a fever greater that 101 F, purulent drainage from wound, increased redness or drainage from wound, foul odor from the wound/dressing, or calf pain - CONTACT YOUR SURGEON.                                                   FOLLOW-UP APPOINTMENTS:  If you do not already have a post-op appointment, please call the office for an appointment to be seen by your surgeon.  Guidelines for how soon to be seen are listed in your "After Visit Summary", but are typically between 1-4 weeks after surgery.  OTHER INSTRUCTIONS:   Knee Replacement:  Do not place pillow under knee, focus on keeping the knee straight while resting. CPM instructions: 0-90 degrees, 2 hours in the morning, 2 hours in the afternoon, and 2 hours in the evening. Place foam block, curve side up under heel at all times except when in CPM or when walking.  DO NOT modify, tear, cut, or change the foam block in any way.  POST-OPERATIVE OPIOID TAPER INSTRUCTIONS: It is important to wean off of your opioid medication as soon as possible. If you do not need pain medication after your surgery it is ok to stop day one. Opioids include: Codeine, Hydrocodone (Norco, Vicodin), Oxycodone (Percocet, oxycontin ) and hydromorphone  amongst others.  Long term and even short term use of opiods can cause: Increased pain response Dependence Constipation Depression Respiratory depression And more.  Withdrawal symptoms can include Flu like symptoms Nausea, vomiting And more Techniques to manage these symptoms Hydrate well Eat regular healthy meals Stay active Use relaxation techniques(deep breathing, meditating, yoga) Do Not substitute Alcohol to help with tapering If you have been on opioids for less than  two weeks and do not have pain than it is ok to stop all together.  Plan to wean off of opioids This plan should start within one week post op of your joint replacement. Maintain the same interval or time between taking each dose and first decrease the dose.  Cut the total daily intake of opioids by one tablet each day Next start to increase the time between doses. The last dose that should be eliminated is the evening dose.  MAKE SURE YOU:  Understand these instructions.  Get help right away if you are not doing well or get worse.    Thank you for letting us  be a part of your medical care team.  It is a privilege we respect greatly.  We hope these instructions will help you stay on track for a fast and full recovery!   Increase activity slowly as tolerated   Complete by: As directed    Post-operative opioid taper instructions:   Complete by: As directed    POST-OPERATIVE OPIOID TAPER INSTRUCTIONS: It is important to wean off of your opioid medication as soon as possible. If you do not need pain medication after your surgery it is ok to stop day one. Opioids include: Codeine, Hydrocodone (Norco, Vicodin), Oxycodone (Percocet, oxycontin ) and hydromorphone  amongst others.  Long term and even short term use of opiods can cause: Increased pain response Dependence Constipation Depression Respiratory depression And more.  Withdrawal symptoms can include Flu like symptoms Nausea, vomiting And more Techniques to manage these symptoms Hydrate well Eat regular healthy meals Stay active Use relaxation techniques(deep breathing, meditating, yoga) Do Not substitute Alcohol to help with tapering If you have been on opioids for less than two weeks and do not have pain than it is ok to stop all together.  Plan to wean off of opioids This plan should start within one week post op of your joint replacement. Maintain the same interval or time between taking each dose and first decrease the  dose.  Cut the total daily intake of opioids by one tablet each day Next start to increase the time between doses. The last dose that should be eliminated is the evening dose.           Follow-up Information     Dayne Even, MD. Go on 11/14/2023.   Specialty: Orthopedic Surgery Why: your appointment has been scheduled for 9:00. Contact information: [address] [place] [address] [phone]         [facility] Go on 11/04/2023.   Why: Your outpatient physical therapy is scheduled for 2:20. Please arrive at 2:00 to complete your paperwork Contact information: Physical Therapy [address] [place] [address] [phone]                  Signed: Geraldina Klinefelter Sheritha Louis 11/02/2023, 8:27 AM

## 2023-11-02 NOTE — Progress Notes (Signed)
 Physical Therapy Treatment Patient Details Name: Evelyn Orr  MRN: [id number] DOB: [birth year] Today's Date: 11/02/2023   History of Present Illness 78 yo female presents to therapy s/p L TKA on 10/31/2023 due to failure of conservative measures. Pt PMH includes but is not limited to: anemia, ba ca, OA, hypothyroidism, R reverse TSA (2018), R TKA (2018), and RA.    PT Comments  Pt is POD # 1 and is progressing well.  She has excellent pain control, quad activation, ROM, safety awareness, and understanding of HEP. She was able to ambulate 200' with RW and supervision without difficulty and performed steps similar to home set up.  Pt has DME and home support. Pt demonstrates safe gait & transfers in order to return home from PT perspective once discharged by MD.  While in hospital, will continue to benefit from PT for skilled therapy to advance mobility and exercises.       If plan is discharge home, recommend the following: A little help with walking and/or transfers;A little help with bathing/dressing/bathroom;Assistance with cooking/housework;Assist for transportation;Help with stairs or ramp for entrance   Can travel by private vehicle        Equipment Recommendations  None recommended by PT    Recommendations for Other Services       Precautions / Restrictions Precautions Precautions: Fall;Knee Restrictions Weight Bearing Restrictions Per Provider Order: Yes LLE Weight Bearing Per Provider Order: Weight bearing as tolerated     Mobility  Bed Mobility Overal bed mobility: Needs Assistance Bed Mobility: Supine to Sit     Supine to sit: Supervision          Transfers Overall transfer level: Needs assistance Equipment used: Rolling walker (2 wheels) Transfers: Sit to/from Stand Sit to Stand: Supervision           General transfer comment: good hand placement and L LE management    Ambulation/Gait Ambulation/Gait assistance: Contact guard assist,  Supervision Gait Distance (Feet): 200 Feet Assistive device: Rolling walker (2 wheels) Gait Pattern/deviations: Step-through pattern Gait velocity: decreased but functional     General Gait Details: CGA progressed to supervision; steady wtih RW with good proximity; educated on step to pattern if needed for pain control   Stairs Stairs: Yes Stairs assistance: Contact guard assist Stair Management: One rail Right, Step to pattern, Forwards Number of Stairs: 3 General stair comments: educated on sequencing ; performed wtihout difficulty   Wheelchair Mobility     Tilt Bed    Modified Rankin (Stroke Patients Only)       Balance Overall balance assessment: Needs assistance Sitting-balance support: Feet supported Sitting balance-Leahy Scale: Good     Standing balance support: No upper extremity supported, During functional activity, Reliant on assistive device for balance, Bilateral upper extremity supported Standing balance-Leahy Scale: Fair Standing balance comment: RW to ambulate; could static stand and do toielting ADLs wtihout support                            Communication    Cognition Arousal: Alert Behavior During Therapy: WFL for tasks assessed/performed   PT - Cognitive impairments: No apparent impairments                                Cueing    Exercises Total Joint Exercises Ankle Circles/Pumps: AROM, Both, 10 reps, Supine Quad Sets: AROM, Both, 10 reps, Supine Heel Slides: AAROM,  Left, 10 reps, Supine Hip ABduction/ADduction: AAROM, Left, 10 reps, Supine Long Arc Quad: AROM, Left, 10 reps, Seated Knee Flexion: AROM, Left, 10 reps, Seated Goniometric ROM: ~L knee 5 to 80 degrees    General Comments        Pertinent Vitals/Pain Pain Assessment Pain Assessment: 0-10 Pain Score: 3  Pain Location: L knee Pain Descriptors / Indicators: Discomfort Pain Intervention(s): Limited activity within patient's tolerance, Monitored  during session, Premedicated before session, Repositioned, Ice applied    Home Living                          Prior Function            PT Goals (current goals can now be found in the care plan section) Progress towards PT goals: Progressing toward goals    Frequency    7X/week      PT Plan      Co-evaluation              AM-PAC PT "6 Clicks" Mobility   Outcome Measure  Help needed turning from your back to your side while in a flat bed without using bedrails?: None Help needed moving from lying on your back to sitting on the side of a flat bed without using bedrails?: A Little Help needed moving to and from a bed to a chair (including a wheelchair)?: A Little Help needed standing up from a chair using your arms (e.g., wheelchair or bedside chair)?: A Little Help needed to walk in hospital room?: A Little Help needed climbing 3-5 steps with a railing? : A Little 6 Click Score: 19    End of Session Equipment Utilized During Treatment: Gait belt Activity Tolerance: Patient tolerated treatment well Patient left: in chair;with call bell/phone within reach;with chair alarm set Nurse Communication: Mobility status PT Visit Diagnosis: Unsteadiness on feet (R26.81);Other abnormalities of gait and mobility (R26.89);Muscle weakness (generalized) (M62.81);Difficulty in walking, not elsewhere classified (R26.2);Pain Pain - Right/Left: Left Pain - part of body: Knee;Leg     Time: 3244-0102 PT Time Calculation (min) (ACUTE ONLY): 30 min  Charges:    $Gait Training: 8-22 mins $Therapeutic Exercise: 8-22 mins PT General Charges $$ ACUTE PT VISIT: 1 Visit                     Cyd Dowse, PT Acute Rehab Services [facility] [phone]    Carolynn Citrin 11/02/2023, 11:23 AM

## 2023-11-02 NOTE — Progress Notes (Signed)
Nurse reviewed discharge instructions with pt.  Pt verbalized understanding of discharge instructions, follow up appointments and new medications.  No concerns at time of discharge. 

## 2023-11-02 NOTE — Care Management Obs Status (Signed)
 MEDICARE OBSERVATION STATUS NOTIFICATION   Patient Details  Name: Evelyn Orr  MRN: [id number] Date of Birth: April 15, 1946   Medicare Observation Status Notification Given:  Birda Buffy, LCSW 11/02/2023, 10:16 AM

## 2023-11-03 ENCOUNTER — Encounter (HOSPITAL_COMMUNITY): Payer: Self-pay | Admitting: Orthopaedic Surgery

## 2023-11-04 DIAGNOSIS — M25662 Stiffness of left knee, not elsewhere classified: Secondary | ICD-10-CM | POA: Diagnosis not present

## 2023-11-04 DIAGNOSIS — Z96652 Presence of left artificial knee joint: Secondary | ICD-10-CM | POA: Diagnosis not present

## 2023-11-08 DIAGNOSIS — M25662 Stiffness of left knee, not elsewhere classified: Secondary | ICD-10-CM | POA: Diagnosis not present

## 2023-11-08 DIAGNOSIS — Z96652 Presence of left artificial knee joint: Secondary | ICD-10-CM | POA: Diagnosis not present

## 2023-11-09 ENCOUNTER — Other Ambulatory Visit: Payer: Self-pay | Admitting: Internal Medicine

## 2023-11-10 DIAGNOSIS — Z96652 Presence of left artificial knee joint: Secondary | ICD-10-CM | POA: Diagnosis not present

## 2023-11-10 DIAGNOSIS — M25662 Stiffness of left knee, not elsewhere classified: Secondary | ICD-10-CM | POA: Diagnosis not present

## 2023-11-11 DIAGNOSIS — M25662 Stiffness of left knee, not elsewhere classified: Secondary | ICD-10-CM | POA: Diagnosis not present

## 2023-11-11 DIAGNOSIS — Z96652 Presence of left artificial knee joint: Secondary | ICD-10-CM | POA: Diagnosis not present

## 2023-11-14 DIAGNOSIS — M25662 Stiffness of left knee, not elsewhere classified: Secondary | ICD-10-CM | POA: Diagnosis not present

## 2023-11-15 DIAGNOSIS — M25662 Stiffness of left knee, not elsewhere classified: Secondary | ICD-10-CM | POA: Diagnosis not present

## 2023-11-15 DIAGNOSIS — Z96652 Presence of left artificial knee joint: Secondary | ICD-10-CM | POA: Diagnosis not present

## 2023-11-17 DIAGNOSIS — Z96652 Presence of left artificial knee joint: Secondary | ICD-10-CM | POA: Diagnosis not present

## 2023-11-17 DIAGNOSIS — M25662 Stiffness of left knee, not elsewhere classified: Secondary | ICD-10-CM | POA: Diagnosis not present

## 2023-11-18 DIAGNOSIS — Z96652 Presence of left artificial knee joint: Secondary | ICD-10-CM | POA: Diagnosis not present

## 2023-11-18 DIAGNOSIS — M25662 Stiffness of left knee, not elsewhere classified: Secondary | ICD-10-CM | POA: Diagnosis not present

## 2023-11-21 ENCOUNTER — Encounter: Payer: Self-pay | Admitting: Internal Medicine

## 2023-11-21 ENCOUNTER — Ambulatory Visit (INDEPENDENT_AMBULATORY_CARE_PROVIDER_SITE_OTHER): Payer: Medicare PPO | Admitting: Internal Medicine

## 2023-11-21 VITALS — BP 130/80 | HR 91 | Temp 98.6°F | Ht 61.0 in | Wt 135.6 lb

## 2023-11-21 DIAGNOSIS — E876 Hypokalemia: Secondary | ICD-10-CM | POA: Insufficient documentation

## 2023-11-21 DIAGNOSIS — E039 Hypothyroidism, unspecified: Secondary | ICD-10-CM

## 2023-11-21 DIAGNOSIS — E663 Overweight: Secondary | ICD-10-CM

## 2023-11-21 DIAGNOSIS — M25662 Stiffness of left knee, not elsewhere classified: Secondary | ICD-10-CM | POA: Diagnosis not present

## 2023-11-21 DIAGNOSIS — M069 Rheumatoid arthritis, unspecified: Secondary | ICD-10-CM

## 2023-11-21 DIAGNOSIS — R030 Elevated blood-pressure reading, without diagnosis of hypertension: Secondary | ICD-10-CM | POA: Diagnosis not present

## 2023-11-21 DIAGNOSIS — Z96652 Presence of left artificial knee joint: Secondary | ICD-10-CM | POA: Insufficient documentation

## 2023-11-21 NOTE — Assessment & Plan Note (Signed)
 She is currently taking Synthroid  88mcg daily. Thyroid  function to be re-evaluated due to potential changes in medication needs with age. - Check thyroid  function tests.

## 2023-11-21 NOTE — Assessment & Plan Note (Signed)
 Decreased appetite leading to weight loss. Prealbumin level low normal, indicating potential protein deficiency. - Encourage increased caloric intake with snacks between meals. - Advise on protein-rich foods to prevent muscle wasting. - Recheck prealbumin level in three months. - She was given samples of Ensure to try as a meal supplement .

## 2023-11-21 NOTE — Progress Notes (Signed)
 I,Evelyn Orr, CMA,acting as a Neurosurgeon for Evelyn LOISE Slocumb, MD.,have documented all relevant documentation on the behalf of Evelyn LOISE Slocumb, MD,as directed by  Evelyn LOISE Slocumb, MD while in the presence of Evelyn LOISE Slocumb, MD.  Subjective:  Patient ID: Evelyn Orr  , female    DOB: May 25, 1946 , 78 y.o.   MRN: [id number]  Chief Complaint  Patient presents with   Hypothyroidism    Patient presents today for thyroid  follow up. She reports compliance with medications. Denies headache, chest pain & sob. She would like to discuss weight loss. She has noticed losing lots of weight. She states having a normal appetite. Eating twice daily. She does not know what could be causing this issue.     HPI Discussed the use of AI scribe software for clinical note transcription with the patient, who gave verbal consent to proceed.  History of Present Illness [name]  is a 78 year old female with arthritis and recent knee replacement who presents for follow-up and blood pressure management.  Three weeks post knee replacement, she is using a cane after transitioning from a walker and is actively participating in therapy. She feels 'pretty good' but notes an exacerbation of her arthritis due to discontinuation of rheumatoid arthritis medication for surgery, leading to a flare-up. A recent course of prednisone  provided some relief.  Her blood pressure has been elevated both before and after surgery, which she attributes to pain, stating 'I know my blood pressure's high cause I'm hurting.' Pain levels in her legs and arthritis are 'about a 5' on a scale of 1 to 10.  After further questioning, she admits that her appetite HAS changed. She has experienced a decrease in appetite, attributing it to not feeling hungry rather than a change in food taste. Her prealbumin level is on the lower end of normal, and her BMI is currently 25. She acknowledges that pain affects her appetite, stating 'When  you're hurting, you're not going to eat.'   Her past medical history includes a colonoscopy that revealed one polyp, a normal chest x-ray, and a history of smoking, having quit at age 88. Preoperative labs showed slightly low potassium and high ferritin levels, with a known low blood count prompting a gastrointestinal evaluation.  She is managing her pain with Tylenol  and muscle spasm medication, having used hydrocodone  post-surgery but now prefers not to use it.   Thyroid  Problem Presents for follow-up visit. Patient reports no cold intolerance, constipation, depressed mood, diaphoresis, diarrhea, heat intolerance, palpitations or tremors. The symptoms have been stable.     Past Medical History:  Diagnosis Date   Anemia    Arthritis    generalized   Breast cancer (HCC)    Breast cancer of upper-outer quadrant of right female breast (HCC) 03/20/2015   Cataract    Cough    GERD (gastroesophageal reflux disease)    uses PRN meds/with certain foods   Hypertension    Hypothyroidism    Nasal congestion    PONV (postoperative nausea and vomiting)    Rheumatoid arthritis(714.0)    Seasonal allergies    Thyroid  disease    hypothyroidism     Family History  Problem Relation Age of Onset   Colon polyps Mother    Hypertension Mother    Heart disease Father    Heart attack Father    Other Sister        high breast density   Colon polyps Brother    Prostate cancer  Brother 70       unknown Gleason   Colon polyps Brother    Prostate cancer Maternal Uncle    Prostate cancer Maternal Uncle    Prostate cancer Maternal Uncle    Breast cancer Paternal Aunt        dx. 35 or younger   Colon cancer Paternal Uncle    Prostate cancer Maternal Grandfather        dx. older age   Colon polyps Son 64       colonoscopy for unspecified reason; found some colon polyps, has not had one since   Breast cancer Cousin        4 maternal cousin had breast cancer    Colon cancer Other    Cancer  Other        MGM's sister   Rectal cancer Neg Hx    Stomach cancer Neg Hx    Esophageal cancer Neg Hx      Current Outpatient Medications:    albuterol  (VENTOLIN  HFA) 108 (90 Base) MCG/ACT inhaler, TAKE 2 PUFFS BY MOUTH EVERY 6 HOURS AS NEEDED FOR WHEEZE OR SHORTNESS OF BREATH, Disp: 18 each, Rfl: 1   aspirin  EC 81 MG tablet, Take 1 tablet (81 mg total) by mouth 2 (two) times daily after a meal. For 2 weeks then once a day for 2 weeks for dvt prevention., Disp: 45 tablet, Rfl: 0   cetirizine  (ZYRTEC  ALLERGY ) 10 MG tablet, Take 1 tablet (10 mg total) by mouth daily., Disp: , Rfl:    Ferric Maltol  (ACCRUFER ) 30 MG CAPS, Take 1 capsule (30 mg total) by mouth daily., Disp: 90 capsule, Rfl: 1   folic acid  (FOLVITE ) 1 MG tablet, Take 1 mg by mouth daily., Disp: , Rfl:    HYDROcodone -acetaminophen  (NORCO/VICODIN) 5-325 MG tablet, Take 1-2 tablets by mouth every 6 (six) hours as needed for moderate pain (pain score 4-6) or severe pain (pain score 7-10) (post op pain)., Disp: 30 tablet, Rfl: 0   leflunomide  (ARAVA ) 20 MG tablet, Take 20 mg by mouth daily., Disp: , Rfl:    levothyroxine  (SYNTHROID ) 88 MCG tablet, TAKE 1 TABLET BY MOUTH EVERY DAY, Disp: 90 tablet, Rfl: 0   montelukast  (SINGULAIR ) 10 MG tablet, TAKE 1 TABLET BY MOUTH EVERY DAY, Disp: 90 tablet, Rfl: 2   sulfaSALAzine  (AZULFIDINE ) 500 MG tablet, Take 1,000 mg by mouth 2 (two) times daily., Disp: , Rfl:    tiZANidine  (ZANAFLEX ) 2 MG tablet, Take 1-2 tablets (2-4 mg total) by mouth every 6 (six) hours as needed for muscle spasms., Disp: 40 tablet, Rfl: 1   triamcinolone  cream (KENALOG ) 0.1 %, Apply 1 Application topically 2 (two) times daily., Disp: 30 g, Rfl: 0   No Known Allergies   Review of Systems  Constitutional:  Positive for unexpected weight change. Negative for diaphoresis.  HENT: Negative.    Respiratory: Negative.    Cardiovascular: Negative.  Negative for palpitations.  Gastrointestinal: Negative.  Negative for  constipation and diarrhea.  Endocrine: Negative for cold intolerance and heat intolerance.  Musculoskeletal:  Positive for arthralgias.  Neurological: Negative.  Negative for tremors.  Psychiatric/Behavioral: Negative.       Today's Vitals   11/21/23 0838  BP: 130/80  Pulse: 91  Temp: 98.6 F (37 C)  SpO2: 98%  Weight: 135 lb 9.6 oz (61.5 kg)  Height: 5' 1 (1.549 m)   Body mass index is 25.62 kg/m.  Wt Readings from Last 3 Encounters:  11/21/23 135 lb 9.6 oz (61.5  kg)  11/01/23 139 lb 15.9 oz (63.5 kg)  10/27/23 140 lb (63.5 kg)    BP Readings from Last 3 Encounters:  11/21/23 130/80  11/02/23 (!) 151/85  10/27/23 (!) 142/83     Objective:  Physical Exam Vitals and nursing note reviewed.  Constitutional:      Appearance: Normal appearance.  HENT:     Head: Normocephalic and atraumatic.   Eyes:     Extraocular Movements: Extraocular movements intact.    Cardiovascular:     Rate and Rhythm: Normal rate and regular rhythm.     Heart sounds: Normal heart sounds.  Pulmonary:     Effort: Pulmonary effort is normal.     Breath sounds: Normal breath sounds.   Musculoskeletal:     Cervical back: Normal range of motion.     Comments: Bilateral MCP swelling Ambulatory with cane   Skin:    General: Skin is warm.   Neurological:     General: No focal deficit present.     Mental Status: She is alert.   Psychiatric:        Mood and Affect: Mood normal.        Behavior: Behavior normal.         Assessment And Plan:  Primary hypothyroidism Assessment & Plan: She is currently taking Synthroid  88mcg daily. Thyroid  function to be re-evaluated due to potential changes in medication needs with age. - Check thyroid  function tests.  Orders: -     TSH  Elevated blood pressure reading Assessment & Plan: She is having some discomfort today. She agrees to rto in 3 months for bp check. If BP is elevated, will initiate meds at that time. She is encouraged to  decrease her salt intake.    Hypokalemia -     BMP8+EGFR  Rheumatoid arthritis involving both hands, unspecified whether rheumatoid factor present [facility]) Assessment & Plan: Chronic, current flare likely secondary to stopping DMARD therapy prior to her knee replacement.  She recently had prednisone  taper which did improve her sx some. There is still significant swelling present.  - Consider resuming arthritis medication post-surgery recovery. - Monitor symptoms and manage with pain relief as needed.    Status post total left knee replacement Assessment & Plan: Three weeks post-surgery with good progress. Pain affects blood pressure and appetite. Effective pain management crucial. - Continue physical therapy. - Use hydrocodone  at night for pain management. - Monitor pain levels and adjust management as needed.   Overweight (BMI 25.0-29.9) Assessment & Plan: Decreased appetite leading to weight loss. Prealbumin level low normal, indicating potential protein deficiency. - Encourage increased caloric intake with snacks between meals. - Advise on protein-rich foods to prevent muscle wasting. - Recheck prealbumin level in three months. - She was given samples of Ensure to try as a meal supplement .   General Health Maintenance Up to date with mammogram,colonoscopy and Pap smear. Chest X-ray normal. - Continue regular health screenings as per guidelines.  Return in 3 months (on 02/21/2024), or bp check and f/u anemia.  Patient was given opportunity to ask questions. Patient verbalized understanding of the plan and was able to repeat key elements of the plan. All questions were answered to their satisfaction.   I, Evelyn LOISE Slocumb, MD, have reviewed all documentation for this visit. The documentation on 11/21/23 for the exam, diagnosis, procedures, and orders are all accurate and complete.   IF YOU HAVE BEEN REFERRED TO A SPECIALIST, IT MAY TAKE 1-2 WEEKS TO SCHEDULE/PROCESS THE  REFERRAL.  IF YOU HAVE NOT HEARD FROM US /SPECIALIST IN TWO WEEKS, PLEASE GIVE US  A CALL AT [phone].   THE PATIENT IS ENCOURAGED TO PRACTICE SOCIAL DISTANCING DUE TO THE COVID-19 PANDEMIC.

## 2023-11-21 NOTE — Assessment & Plan Note (Signed)
 Three weeks post-surgery with good progress. Pain affects blood pressure and appetite. Effective pain management crucial. - Continue physical therapy. - Use hydrocodone  at night for pain management. - Monitor pain levels and adjust management as needed.

## 2023-11-21 NOTE — Assessment & Plan Note (Signed)
 Chronic, current flare likely secondary to stopping DMARD therapy prior to her knee replacement.  She recently had prednisone  taper which did improve her sx some. There is still significant swelling present.  - Consider resuming arthritis medication post-surgery recovery. - Monitor symptoms and manage with pain relief as needed.

## 2023-11-21 NOTE — Patient Instructions (Signed)

## 2023-11-21 NOTE — Assessment & Plan Note (Signed)
 She is having some discomfort today. She agrees to rto in 3 months for bp check. If BP is elevated, will initiate meds at that time. She is encouraged to decrease her salt intake.

## 2023-11-22 LAB — BMP8+EGFR
BUN/Creatinine Ratio: 11 — ABNORMAL LOW (ref 12–28)
BUN: 8 mg/dL (ref 8–27)
CO2: 21 mmol/L (ref 20–29)
Calcium: 9.4 mg/dL (ref 8.7–10.3)
Chloride: 98 mmol/L (ref 96–106)
Creatinine, Ser: 0.7 mg/dL (ref 0.57–1.00)
Glucose: 73 mg/dL (ref 70–99)
Potassium: 3.3 mmol/L — ABNORMAL LOW (ref 3.5–5.2)
Sodium: 138 mmol/L (ref 134–144)
eGFR: 90 mL/min/{1.73_m2} (ref >59)

## 2023-11-22 LAB — TSH: TSH: 2.38 u[IU]/mL (ref 0.450–4.500)

## 2023-11-23 ENCOUNTER — Ambulatory Visit (INDEPENDENT_AMBULATORY_CARE_PROVIDER_SITE_OTHER): Payer: Medicare PPO | Admitting: Podiatry

## 2023-11-23 ENCOUNTER — Encounter: Payer: Self-pay | Admitting: Podiatry

## 2023-11-23 DIAGNOSIS — M79674 Pain in right toe(s): Secondary | ICD-10-CM

## 2023-11-23 DIAGNOSIS — L84 Corns and callosities: Secondary | ICD-10-CM

## 2023-11-23 DIAGNOSIS — M79675 Pain in left toe(s): Secondary | ICD-10-CM

## 2023-11-23 DIAGNOSIS — Q828 Other specified congenital malformations of skin: Secondary | ICD-10-CM | POA: Diagnosis not present

## 2023-11-23 DIAGNOSIS — M79671 Pain in right foot: Secondary | ICD-10-CM | POA: Diagnosis not present

## 2023-11-23 DIAGNOSIS — B351 Tinea unguium: Secondary | ICD-10-CM | POA: Diagnosis not present

## 2023-11-23 DIAGNOSIS — M79672 Pain in left foot: Secondary | ICD-10-CM

## 2023-11-23 DIAGNOSIS — Z96652 Presence of left artificial knee joint: Secondary | ICD-10-CM | POA: Diagnosis not present

## 2023-11-23 DIAGNOSIS — M25662 Stiffness of left knee, not elsewhere classified: Secondary | ICD-10-CM | POA: Diagnosis not present

## 2023-11-23 NOTE — Progress Notes (Signed)
  Subjective:  Patient ID: Evelyn Orr , female    DOB: 07-Dec-1945,  MRN: [id number]  Evelyn Orr  presents to clinic today for callus(es) of both feet and painful thick toenails that are difficult to trim. Painful toenails interfere with ambulation. Aggravating factors include wearing enclosed shoe gear. Pain is relieved with periodic professional debridement. Painful calluses are aggravated when weightbearing with and without shoegear. Pain is relieved with periodic professional debridement. She is requesting nail debridement on today's visit. Patient is recovering from knee surgery and has therapy appointment this afternoon. Chief Complaint  Patient presents with   RFC    RM#17 RFC patient has no concerns at this time.   New problem(s): None.   PCP is Jarold Medici, MD.  No Known Allergies  Review of Systems: Negative except as noted in the HPI.  Objective: No changes noted in today's physical examination. There were no vitals filed for this visit. Evelyn Orr  is a pleasant 78 y.o. female WD, WN in NAD. AAO x 3.  Vascular Examination: Vascular status intact b/l with palpable pedal pulses. CFT immediate b/l. Pedal hair present. No edema. No pain with calf compression b/l. Skin temperature gradient WNL b/l. No varicosities noted. No cyanosis or clubbing noted.  Neurological Examination: Sensation grossly intact b/l with 10 gram monofilament. Vibratory sensation intact b/l.  Dermatological Examination: Pedal skin with normal turgor, texture and tone b/l. No open wounds nor interdigital macerations noted.   Toenails 1-5 b/l well maintained with adequate length. No erythema, no edema, no drainage, no fluctuance.   Hyperkeratotic lesion(s) submet head 5 b/l.  No erythema, no edema, no drainage, no fluctuance.   Musculoskeletal Examination: Muscle strength 5/5 to b/l LE.  No pain, crepitus noted b/l. No gross pedal deformities. Patient ambulates independently  without assistive aids.   Radiographs: None  Assessment/Plan: 1. Pain due to onychomycosis of toenails of both feet   2. Callus   3. Pain in both feet    Patient was evaluated and treated. All patient's and/or POA's questions/concerns addressed on today's visit. Toenails 1-5 debrided in length and girth without incident. Callus(es) submet head 5 b/l pared with sharp debridement without incident. Continue daily foot inspections and monitor blood glucose per PCP/Endocrinologist's recommendations. Continue soft, supportive shoe gear daily. Report any pedal injuries to medical professional. Call office if there are any questions/concerns. -Patient/POA to call should there be question/concern in the interim.   Return in about 3 months (around 02/23/2024).  Delon LITTIE Merlin, DPM      [facility] LOCATION: [address] [place] [address]                   Office [phone]   [facility] LOCATION: [address] [place], [place] [address] Office [phone]

## 2023-11-27 ENCOUNTER — Ambulatory Visit: Payer: Self-pay | Admitting: Internal Medicine

## 2023-11-28 DIAGNOSIS — M7989 Other specified soft tissue disorders: Secondary | ICD-10-CM | POA: Diagnosis not present

## 2023-11-28 DIAGNOSIS — M0589 Other rheumatoid arthritis with rheumatoid factor of multiple sites: Secondary | ICD-10-CM | POA: Diagnosis not present

## 2023-11-28 DIAGNOSIS — M25439 Effusion, unspecified wrist: Secondary | ICD-10-CM | POA: Diagnosis not present

## 2023-11-28 DIAGNOSIS — M25471 Effusion, right ankle: Secondary | ICD-10-CM | POA: Diagnosis not present

## 2023-11-28 DIAGNOSIS — M79643 Pain in unspecified hand: Secondary | ICD-10-CM | POA: Diagnosis not present

## 2023-11-28 DIAGNOSIS — M25539 Pain in unspecified wrist: Secondary | ICD-10-CM | POA: Diagnosis not present

## 2023-11-28 DIAGNOSIS — M1990 Unspecified osteoarthritis, unspecified site: Secondary | ICD-10-CM | POA: Diagnosis not present

## 2023-11-28 DIAGNOSIS — Z79899 Other long term (current) drug therapy: Secondary | ICD-10-CM | POA: Diagnosis not present

## 2023-11-28 DIAGNOSIS — M8580 Other specified disorders of bone density and structure, unspecified site: Secondary | ICD-10-CM | POA: Diagnosis not present

## 2023-11-29 DIAGNOSIS — M25662 Stiffness of left knee, not elsewhere classified: Secondary | ICD-10-CM | POA: Diagnosis not present

## 2023-11-29 DIAGNOSIS — Z96652 Presence of left artificial knee joint: Secondary | ICD-10-CM | POA: Diagnosis not present

## 2023-12-05 DIAGNOSIS — M25512 Pain in left shoulder: Secondary | ICD-10-CM | POA: Diagnosis not present

## 2023-12-07 DIAGNOSIS — M0589 Other rheumatoid arthritis with rheumatoid factor of multiple sites: Secondary | ICD-10-CM | POA: Diagnosis not present

## 2023-12-12 DIAGNOSIS — H04123 Dry eye syndrome of bilateral lacrimal glands: Secondary | ICD-10-CM | POA: Diagnosis not present

## 2023-12-12 DIAGNOSIS — H524 Presbyopia: Secondary | ICD-10-CM | POA: Diagnosis not present

## 2023-12-12 DIAGNOSIS — H40023 Open angle with borderline findings, high risk, bilateral: Secondary | ICD-10-CM | POA: Diagnosis not present

## 2023-12-22 DIAGNOSIS — M0589 Other rheumatoid arthritis with rheumatoid factor of multiple sites: Secondary | ICD-10-CM | POA: Diagnosis not present

## 2023-12-23 LAB — COMPREHENSIVE METABOLIC PANEL WITH GFR: EGFR (African American): 77

## 2023-12-23 LAB — HEP PANEL, GENERAL: HM Hepatitis Screen: NEGATIVE

## 2023-12-26 DIAGNOSIS — M25539 Pain in unspecified wrist: Secondary | ICD-10-CM | POA: Diagnosis not present

## 2023-12-26 DIAGNOSIS — M1990 Unspecified osteoarthritis, unspecified site: Secondary | ICD-10-CM | POA: Diagnosis not present

## 2023-12-26 DIAGNOSIS — M542 Cervicalgia: Secondary | ICD-10-CM | POA: Diagnosis not present

## 2023-12-26 DIAGNOSIS — M79643 Pain in unspecified hand: Secondary | ICD-10-CM | POA: Diagnosis not present

## 2023-12-26 DIAGNOSIS — M25439 Effusion, unspecified wrist: Secondary | ICD-10-CM | POA: Diagnosis not present

## 2023-12-26 DIAGNOSIS — M0589 Other rheumatoid arthritis with rheumatoid factor of multiple sites: Secondary | ICD-10-CM | POA: Diagnosis not present

## 2023-12-26 DIAGNOSIS — M8580 Other specified disorders of bone density and structure, unspecified site: Secondary | ICD-10-CM | POA: Diagnosis not present

## 2023-12-26 DIAGNOSIS — M25512 Pain in left shoulder: Secondary | ICD-10-CM | POA: Diagnosis not present

## 2023-12-26 DIAGNOSIS — Z79899 Other long term (current) drug therapy: Secondary | ICD-10-CM | POA: Diagnosis not present

## 2023-12-28 ENCOUNTER — Ambulatory Visit
Admission: RE | Admit: 2023-12-28 | Discharge: 2023-12-28 | Disposition: A | Discharge status: Home / Self Care | Source: Ambulatory Visit | Attending: Infectious Diseases | Admitting: Infectious Diseases

## 2023-12-28 ENCOUNTER — Ambulatory Visit (INDEPENDENT_AMBULATORY_CARE_PROVIDER_SITE_OTHER): Admitting: Infectious Diseases

## 2023-12-28 ENCOUNTER — Encounter: Payer: Self-pay | Admitting: Infectious Diseases

## 2023-12-28 ENCOUNTER — Other Ambulatory Visit: Payer: Self-pay

## 2023-12-28 VITALS — BP 135/79 | HR 85 | Temp 98.5°F | Ht 60.0 in | Wt 138.0 lb

## 2023-12-28 DIAGNOSIS — R7612 Nonspecific reaction to cell mediated immunity measurement of gamma interferon antigen response without active tuberculosis: Secondary | ICD-10-CM

## 2023-12-28 DIAGNOSIS — R7611 Nonspecific reaction to tuberculin skin test without active tuberculosis: Secondary | ICD-10-CM | POA: Diagnosis not present

## 2023-12-28 DIAGNOSIS — M0579 Rheumatoid arthritis with rheumatoid factor of multiple sites without organ or systems involvement: Secondary | ICD-10-CM

## 2023-12-28 NOTE — Patient Instructions (Addendum)
  Today, we discussed your recent positive Quantiferon Gold test for TB exposure and your ongoing rheumatoid arthritis management. We reviewed your current medications and recent treatments, including your last rituximab  infusion and steroid injection.  YOUR PLAN:  -LATENT TUBERCULOSIS INFECTION: A positive Quantiferon Gold test suggests you have been exposed to TB, which can remain inactive in your body. Due to your immunosuppressive therapy for rheumatoid arthritis, there is a risk of it becoming active. We will repeat the Quantiferon Gold test to confirm the result and order a chest x-ray to check for active TB. If the repeat test is positive, we will start you on a medication called INH for nine months and monitor your liver function monthly during treatment.  -RHEUMATOID ARTHRITIS: Rheumatoid arthritis is an autoimmune condition that causes inflammation and pain in your joints. You have been managing this condition for twelve years with medications like leflunomide  and sulfasalazine . Your recent steroid injection was to help with joint pain. We will continue with your current medications and monitor your inflammation levels.  INSTRUCTIONS:  Please schedule a repeat Quantiferon Gold test and a chest x-ray as soon as possible. If the repeat test is positive, we will begin treatment with INH and monitor your liver function monthly. Continue taking your current medications for rheumatoid arthritis and follow up with us  if you experience any new or worsening symptoms.    [facility] Address: [address] [place], [place], [place] [address] Phone: [phone] Hours:  Monday 6:30?AM-7?PM Tuesday 6:30?AM-7?PM Wednesday 6:30?AM-7?PM Thursday 6:30?AM-7?PM Friday 6:30?AM-7?PM Saturday 6:30?AM-7?PM Sunday 6:30?AM-7?PM

## 2023-12-28 NOTE — Progress Notes (Signed)
 NAME: Evelyn Orr   DOB: 05-26-1946  MRN: [id number]  Date/Time: 12/28/2023 2:31 PM  REQUESTING PROVIDER Subjective:  REASON FOR CONSULT:  ? Evelyn Orr  is a 78 y.o. with a history of , rt reverse shoulder arthroplasty, b/l TKA is referred for  positive Quantiferon Gold test by her rheumatologist Dr.Govinda Aryal.  She has a history of rheumatoid arthritis diagnosed approximately twelve years ago. Her treatment regimen has included methotrexate, hydroxychloroquine,   leflunomide , and sulfasalazine . She currently takes sulfasalazine  and leflunomide , with the last infusion of rituximab  administered last Thursday. She has undergone joint replacements, including both knees and the right shoulder. Her symptoms include joint pain in the hands, shoulders, neck, and knees. She recently received a steroid injection for flare up.  She has a history of a positive Quantiferon Gold test. She did not know her previous tests She denies known contact with anyone diagnosed with TB. She retired from [employer]- She volunteers weekly, distributing food to the homeless,  She lives with her husband, who is retired, and her 70 year old granddaughter, who attends school.. No anemia, diabetes, high blood pressure, heart problems, asthma, or COPD.  Past Medical History:  Diagnosis Date   Anemia    Arthritis    generalized   Breast cancer (HCC)    Breast cancer of upper-outer quadrant of right female breast (HCC) 03/20/2015   Cataract    Cough    GERD (gastroesophageal reflux disease)    uses PRN meds/with certain foods   Hypertension    Hypothyroidism    Nasal congestion    PONV (postoperative nausea and vomiting)    Rheumatoid arthritis(714.0)    Seasonal allergies    Thyroid  disease    hypothyroidism    Past Surgical History:  Procedure Laterality Date   BREAST LUMPECTOMY WITH RADIOACTIVE SEED LOCALIZATION Right 04/09/2015   Procedure: BREAST LUMPECTOMY WITH RADIOACTIVE SEED LOCALIZATION;   Surgeon: Donnice Bury, MD;  Location: [facility];  Service: General;  Laterality: Right;   BREAST REDUCTION SURGERY  2001   CALCANEAL OSTEOTOMY Left 10/11/2013   Procedure: LEFT CALCANEAL OSTEOTOMY;  Surgeon: Norleen Armor, MD;  Location: [facility];  Service: Orthopedics;  Laterality: Left;   CARPAL TUNNEL RELEASE     left   CERVICAL FUSION  2009   posterior C5-6   COLONOSCOPY     2002,2005,2008 w/Brodie   COLONOSCOPY  2015   Brodie-MAC-prep good-TA- recall 5 yrs   GASTROCNEMIUS RECESSION Left 10/11/2013   Procedure: LEFT GASTROC RECESSION; LEFT POSTERIOR TIBIAL TENOLYSIS;  Surgeon: Norleen Armor, MD;  Location: [facility];  Service: Orthopedics;  Laterality: Left;   KNEE ARTHROSCOPY  2004   right   POLYPECTOMY     REMOVAL OF IMPLANT Left 02/14/2014   Procedure: LEFT FOOT REMOVAL OF DEEP IMPLANT;  Surgeon: Norleen Armor, MD;  Location: [facility];  Service: Orthopedics;  Laterality: Left;   REVERSE SHOULDER ARTHROPLASTY Right 03/05/2021   Procedure: REVERSE SHOULDER ARTHROPLASTY;  Surgeon: Dozier Soulier, MD;  Location: [facility];  Service: Orthopedics;  Laterality: Right;   SHOULDER ARTHROSCOPY W/ ROTATOR CUFF REPAIR  2006   right   THYROIDECTOMY     age 43   TONSILLECTOMY     TOTAL KNEE ARTHROPLASTY Right 06/21/2016   Procedure: RIGHT TOTAL KNEE ARTHROPLASTY;  Surgeon: Donnice Car, MD;  Location: [facility];  Service: Orthopedics;  Laterality: Right;   TOTAL KNEE ARTHROPLASTY Left 11/01/2023   Procedure: ARTHROPLASTY, KNEE, TOTAL;  Surgeon: Sheril Coy, MD;  Location: [facility];  Service: Orthopedics;  Laterality: Left;  LEFT TOTAL KNEE ARTHOPLASTY   TUBAL LIGATION     WRIST ARTHRODESIS  2003   left     Social History   Socioeconomic History   Marital status: Married    Spouse name: Not on file   Number of children: Not on file   Years of education: Not on file   Highest education level: Not on file  Occupational  History   Occupation: retired  Tobacco Use   Smoking status: Former    Current packs/day: 0.00    Average packs/day: 0.3 packs/day for 21.0 years (5.3 ttl pk-yrs)    Types: Cigarettes    Start date: 02/13/1963    Quit date: 02/13/1984    Years since quitting: 39.8    Passive exposure: Never   Smokeless tobacco: Never  Vaping Use   Vaping status: Never Used  Substance and Sexual Activity   Alcohol use: No   Drug use: No   Sexual activity: Not Currently    Birth control/protection: Post-menopausal  Other Topics Concern   Not on file  Social History Narrative   Not on file   Social Drivers of Health   Financial Resource Strain: Low Risk  (07/29/2023)   Overall Financial Resource Strain (CARDIA)    Difficulty of Paying Living Expenses: Not hard at all  Food Insecurity: No Food Insecurity (11/01/2023)   Hunger Vital Sign    Worried About Running Out of Food in the Last Year: Never true    Ran Out of Food in the Last Year: Never true  Transportation Needs: No Transportation Needs (11/01/2023)   PRAPARE - Administrator, Civil Service (Medical): No    Lack of Transportation (Non-Medical): No  Physical Activity: Sufficiently Active (07/29/2023)   Exercise Vital Sign    Days of Exercise per Week: 4 days    Minutes of Exercise per Session: 60 min  Stress: No Stress Concern Present (07/29/2023)   [employer] of Occupational Health - Occupational Stress Questionnaire    Feeling of Stress : Not at all  Social Connections: Socially Integrated (11/01/2023)   Social Connection and Isolation Panel    Frequency of Communication with Friends and Family: More than three times a week    Frequency of Social Gatherings with Friends and Family: More than three times a week    Attends Religious Services: More than 4 times per year    Active Member of [employer] or Organizations: No    Attends Engineer, structural: More than 4 times per year    Marital Status: Married  Careers information officer Violence: Not At Risk (11/01/2023)   Humiliation, Afraid, Rape, and Kick questionnaire    Fear of Current or Ex-Partner: No    Emotionally Abused: No    Physically Abused: No    Sexually Abused: No    Family History  Problem Relation Age of Onset   Colon polyps Mother    Hypertension Mother    Heart disease Father    Heart attack Father    Other Sister        high breast density   Colon polyps Brother    Prostate cancer Brother 72       unknown Gleason   Colon polyps Brother    Prostate cancer Maternal Uncle    Prostate cancer Maternal Uncle    Prostate cancer Maternal Uncle    Breast cancer Paternal Aunt  dx. 100 or younger   Colon cancer Paternal Uncle    Prostate cancer Maternal Grandfather        dx. older age   Colon polyps Son 49       colonoscopy for unspecified reason; found some colon polyps, has not had one since   Breast cancer Cousin        4 maternal cousin had breast cancer    Colon cancer Other    Cancer Other        MGM's sister   Rectal cancer Neg Hx    Stomach cancer Neg Hx    Esophageal cancer Neg Hx    No Known Allergies I? Current Outpatient Medications  Medication Sig Dispense Refill   albuterol  (VENTOLIN  HFA) 108 (90 Base) MCG/ACT inhaler TAKE 2 PUFFS BY MOUTH EVERY 6 HOURS AS NEEDED FOR WHEEZE OR SHORTNESS OF BREATH 18 each 1   aspirin  EC 81 MG tablet Take 1 tablet (81 mg total) by mouth 2 (two) times daily after a meal. For 2 weeks then once a day for 2 weeks for dvt prevention. 45 tablet 0   cetirizine  (ZYRTEC  ALLERGY ) 10 MG tablet Take 1 tablet (10 mg total) by mouth daily.     Ferric Maltol  (ACCRUFER ) 30 MG CAPS Take 1 capsule (30 mg total) by mouth daily. 90 capsule 1   folic acid  (FOLVITE ) 1 MG tablet Take 1 mg by mouth daily.     HYDROcodone -acetaminophen  (NORCO/VICODIN) 5-325 MG tablet Take 1-2 tablets by mouth every 6 (six) hours as needed for moderate pain (pain score 4-6) or severe pain (pain score 7-10) (post op pain). 30  tablet 0   leflunomide  (ARAVA ) 20 MG tablet Take 20 mg by mouth daily.     levothyroxine  (SYNTHROID ) 88 MCG tablet TAKE 1 TABLET BY MOUTH EVERY DAY 90 tablet 0   montelukast  (SINGULAIR ) 10 MG tablet TAKE 1 TABLET BY MOUTH EVERY DAY 90 tablet 2   sulfaSALAzine  (AZULFIDINE ) 500 MG tablet Take 1,000 mg by mouth 2 (two) times daily.     tiZANidine  (ZANAFLEX ) 2 MG tablet Take 1-2 tablets (2-4 mg total) by mouth every 6 (six) hours as needed for muscle spasms. 40 tablet 1   triamcinolone  cream (KENALOG ) 0.1 % Apply 1 Application topically 2 (two) times daily. 30 g 0   No current facility-administered medications for this visit.     Abtx:  Anti-infectives (From admission, onward)    None       REVIEW OF SYSTEMS:  Const: negative fever, negative chills, negative weight loss Eyes: negative diplopia or visual changes, negative eye pain ENT: negative coryza, negative sore throat Resp: negative cough, hemoptysis, dyspnea Cards: negative for chest pain, palpitations, lower extremity edema GU: negative for frequency, dysuria and hematuria GI: Negative for abdominal pain, diarrhea, bleeding, constipation Skin: negative for rash and pruritus Heme: negative for easy bruising and gum/nose bleeding FD:floupeoz joints pain and stiffness- hands, shoulders, knee, neck Neurolo:negative for headaches, dizziness, vertigo, memory problems  Psych: negative for feelings of anxiety, depression  Endocrine: negative for thyroid , diabetes Allergy /Immunology- negative for any medication or food allergies ?  Objective:  VITALS:  BP 135/79   Pulse 85   Temp 98.5 F (36.9 C) (Oral)   Ht 5' (1.524 m)   Wt 138 lb (62.6 kg)   SpO2 98%   BMI 26.95 kg/m   PHYSICAL EXAM:  General: Alert, cooperative, no distress, appears stated age.  Head: Normocephalic, without obvious abnormality, atraumatic. Eyes: Conjunctivae clear, anicteric sclerae.  Pupils are equal ENT Nares normal. No drainage or sinus  tenderness. Lips, mucosa, and tongue normal. No Thrush Neck: Supple, symmetrical, no adenopathy, thyroid : non tender no carotid bruit and no JVD. Back: No CVA tenderness. Lungs: Clear to auscultation bilaterally. No Wheezing or Rhonchi. No rales. Heart: Regular rate and rhythm, no murmur, rub or gallop. Abdomen: Soft, non-tender,not distended. Bowel sounds normal. No masses Extremities: hands MCP joint defromity Skin: No rashes or lesions. Or bruising Lymph: Cervical, supraclavicular normal. Neurologic: Grossly non-focal Pertinent Labs Lab Results CBC    Component Value Date/Time   WBC 6.9 10/27/2023 0849   RBC 3.84 (L) 10/27/2023 0849   HGB 11.3 (L) 10/27/2023 0849   HGB 12.0 08/31/2023 1142   HGB 12.6 12/16/2016 1245   HCT 36.8 10/27/2023 0849   HCT 38.0 08/31/2023 1142   HCT 38.2 12/16/2016 1245   PLT 324 10/27/2023 0849   PLT 217 08/31/2023 1142   MCV 95.8 10/27/2023 0849   MCV 93 08/31/2023 1142   MCV 93.4 12/16/2016 1245   MCH 29.4 10/27/2023 0849   MCHC 30.7 10/27/2023 0849   RDW 12.6 10/27/2023 0849   RDW 11.8 08/31/2023 1142   RDW 16.2 (H) 12/16/2016 1245   LYMPHSABS 2.3 02/08/2022 0823   LYMPHSABS 2.3 12/28/2021 1258   LYMPHSABS 2.9 12/16/2016 1245   MONOABS 0.4 02/08/2022 0823   MONOABS 0.2 12/16/2016 1245   EOSABS 0.0 02/08/2022 0823   EOSABS 0.0 12/28/2021 1258   BASOSABS 0.1 02/08/2022 0823   BASOSABS 0.0 12/28/2021 1258   BASOSABS 0.0 12/16/2016 1245       Latest Ref Rng & Units 11/21/2023    9:17 AM 10/27/2023    8:49 AM 08/31/2023   11:42 AM  CMP  Glucose 70 - 99 mg/dL 73  890  70   BUN 8 - 27 mg/dL 8  12  7    Creatinine 0.57 - 1.00 mg/dL 9.29  9.38  9.25   Sodium 134 - 144 mmol/L 138  137  142   Potassium 3.5 - 5.2 mmol/L 3.3  3.4  3.7   Chloride 96 - 106 mmol/L 98  102  102   CO2 20 - 29 mmol/L 21  24  20    Calcium 8.7 - 10.3 mg/dL 9.4  9.2  9.5   Total Protein 6.5 - 8.1 g/dL  7.6  7.4   Total Bilirubin 0.0 - 1.2 mg/dL  0.8  0.3   Alkaline  Phos 38 - 126 U/L  87  107   AST 15 - 41 U/L  23  26   ALT 0 - 44 U/L  11  11     LABS   Quantiferon Gold: positive   Erythrocyte sedimentation rate (ESR): 119 mm/hr (12/22/2023)   Hemoglobin: 11 g/dL (92/75/7974)   White blood cell count: 10.7 x 10^3/L (12/22/2023)   Platelet count: 361 x 10^3/L (12/22/2023)   Glucose: 95 mg/dL (92/75/7974)   Creatinine: 0.80 mg/dL (92/75/7974)   Glomerular filtration rate (GFR): 77 mL/min/1.73 m (12/22/2023)   Alanine aminotransferase (ALT): 17 U/L (12/22/2023)   Aspartate aminotransferase (AST): 7 U/L (12/22/2023)   Alkaline phosphatase: 121 U/L (12/22/2023)   Bilirubin: 0.3 mg/dL (92/75/7974)   C-reactive protein (CRP): 29 mg/L (12/22/2023)   Hepatitis B surface antigen: negative (12/22/2023)   Hepatitis B surface antibody: nonreactive (12/22/2023)   Hepatitis B core antibody: negative (12/22/2023)   Hepatitis C antibody: nonreactive (12/22/2023)    Microbiology: No results found for this or any previous visit (from the  past 240 hours).  Lines and Device Date on insertion # of days DC  Central line     Foley     ETT       IMAGING RESULTS: 10/27/23 CXR no infiltrate I have personally reviewed the films ? Impression/Recommendation Latent tuberculosis infection in immunosuppressed patient with rheumatoid arthritis   A positive Quantiferon Gold test indicates latent TB infection, with a negative test in 8/14 / 2023. I spoke to [facility] and got this information and he faxed it   There is a risk of progression to active TB due to immunosuppressive therapy for rheumatoid arthritis. May  Repeat the Quantiferon Gold test to confirm the positive result. Order a chest x-ray to assess for active TB. If the repeat test is positive, initiate treatment with INH for nine months. Rifampin has interaction with leflunamide and increase the levels- pt does not want rifampin  Monitor liver function tests monthly during treatment.  Rheumatoid arthritis  involving multiple joints with positive rheumatoid factor   She has had rheumatoid arthritis for twelve years, affecting her hands, shoulders, neck, and knees. A recent steroid injection was administered for joint pain. High sed rate and C-reactive protein levels indicate active inflammation. Current medications include leflunomide  and sulfasalazine .? ?  Hypothyroidism on synthroid   Discussed with patient in detail Discussed with Dr.Aryal  ________________________________________________

## 2023-12-30 ENCOUNTER — Ambulatory Visit: Payer: Self-pay | Admitting: Gastroenterology

## 2024-01-01 LAB — QUANTIFERON-TB GOLD PLUS
Mitogen-NIL: 6.7 [IU]/mL
NIL: 0.39 [IU]/mL
QuantiFERON-TB Gold Plus: NEGATIVE
TB1-NIL: <0 [IU]/mL
TB2-NIL: <0 [IU]/mL

## 2024-01-05 ENCOUNTER — Telehealth: Payer: Self-pay | Admitting: Gastroenterology

## 2024-01-05 NOTE — Telephone Encounter (Signed)
 Patient was referred to [facility] for anemia. Negative colonoscopy and EGD. Encouraged to follow up to determine if further diagnostic testing is needed. Patient agrees to keep the upcoming appointment.

## 2024-01-05 NOTE — Telephone Encounter (Signed)
 Patient is calling to find out if she needs to keep her appointment schedule for 13 th of August to follow up or can she cancel her appointment for the 13 th of August since her results came back negative. Please advise.

## 2024-01-09 ENCOUNTER — Encounter: Payer: Self-pay | Admitting: Rheumatology

## 2024-01-09 ENCOUNTER — Other Ambulatory Visit: Payer: Self-pay | Admitting: Internal Medicine

## 2024-01-09 MED ORDER — ACCRUFER 30 MG PO CAPS: 1.0000 | ORAL_CAPSULE | Freq: Every day | ORAL | 1 refills | Status: DC

## 2024-01-09 NOTE — Telephone Encounter (Signed)
 Copied from CRM [id number]. Topic: Clinical - Medication Refill >> Jan 09, 2024  1:46 PM Fonda T wrote: Medication: Ferric Maltol  (ACCRUFER ) 30 MG CAPS  Has the patient contacted their pharmacy? Yes, advised patient to contact office   This is the patient's preferred pharmacy:   [facility] - [place], [place] - [address] [address] [place] [address] Phone: [phone] Fax: [phone]  Is this the correct pharmacy for this prescription? Yes If no, delete pharmacy and type the correct one.   Has the prescription been filled recently? Yes  Is the patient out of the medication? Yes  Has the patient been seen for an appointment in the last year OR does the patient have an upcoming appointment? Yes  Can we respond through MyChart? No, patient prefers phone   Agent: Please be advised that Rx refills may take up to 3 business days. We ask that you follow-up with your pharmacy.

## 2024-01-11 ENCOUNTER — Encounter: Payer: Self-pay | Admitting: Gastroenterology

## 2024-01-11 ENCOUNTER — Ambulatory Visit: Admitting: Gastroenterology

## 2024-01-11 VITALS — BP 102/60 | HR 107 | Ht 61.0 in | Wt 139.0 lb

## 2024-01-11 DIAGNOSIS — K2940 Chronic atrophic gastritis without bleeding: Secondary | ICD-10-CM | POA: Diagnosis not present

## 2024-01-11 DIAGNOSIS — D649 Anemia, unspecified: Secondary | ICD-10-CM

## 2024-01-11 NOTE — Patient Instructions (Signed)
 Follow up in as needed.   _______________________________________________________  If your blood pressure at your visit was 140/90 or greater, please contact your primary care physician to follow up on this.  _______________________________________________________  If you are age 76 or older, your body mass index should be between 23-30. Your Body mass index is 26.26 kg/m. If this is out of the aforementioned range listed, please consider follow up with your Primary Care Provider.  If you are age 38 or younger, your body mass index should be between 19-25. Your Body mass index is 26.26 kg/m. If this is out of the aformentioned range listed, please consider follow up with your Primary Care Provider.   ________________________________________________________  The [facility] providers would like to encourage you to use MYCHART to communicate with providers for non-urgent requests or questions.  Due to long hold times on the telephone, sending your provider a message by [facility] may be a faster and more efficient way to get a response.  Please allow 48 business hours for a response.  Please remember that this is for non-urgent requests.  _______________________________________________________  [facility] is using a team-based approach to care.  Your team is made up of your doctor and two to three APPS. Our APPS (Nurse Practitioners and Physician Assistants) work with your physician to ensure care continuity for you. They are fully qualified to address your health concerns and develop a treatment plan. They communicate directly with your gastroenterologist to care for you. Seeing the Advanced Practice Practitioners on your physician's team can help you by facilitating care more promptly, often allowing for earlier appointments, access to diagnostic testing, procedures, and other specialty referrals.

## 2024-01-11 NOTE — Progress Notes (Signed)
 01/11/2024 Treana Lacour Defino  [id number] February 17, 1946   HISTORY OF PRESENT ILLNESS: This is a 78 year old female who is a patient of Dr. Trenna.  She was seen by me back in April for FOBT positive stools.  Hemoglobin had had a small drop on one of the checks as well.  Was scheduled for EGD and colonoscopy as below.  Hemoglobin now is 11.0 g.  Iron studies normal, vitamin B12 levels are normal.  She moves her bowels regularly.  No black or bloody stools.  Says that she feels good.  She denies any heartburn or reflux symptoms.  Is not on any acid reflux medication.  EGD and colonoscopy 09/2023:  - LA Grade B reflux esophagitis with no bleeding. - 2 cm hiatal hernia. - Gastritis. Biopsied. - Gastritis. Biopsied. - Normal examined duodenum.  Colonoscopy 09/2023:  - One 4 mm polyp in the transverse colon, removed with a cold snare. Resected and retrieved. - Diverticulosis in the sigmoid colon and in the descending colon. - Non- bleeding external and internal hemorrhoids.  1. Surgical [P], gastric antrum and gastric body :      - GASTRIC ANTRAL TYPE MUCOSA SHOWING PATCHY CHRONIC GASTRITIS WITH ATROPHY,      INTESTINAL METAPLASIA AND FOCAL MICRONODULAR ECL CELL HYPERPLASIA.SEE NOTE      - NEGATIVE FOR DYSPLASIA       2. Surgical [P], gastric nodules :      - GASTRIC ANTRAL MUCOSA WITH CHRONIC GASTRITIS, NONSPECIFIC HYPERPLASTIC CHANGES      AND INTESTINAL METAPLASIA      - NEGATIVE FOR DYSPLASIA      - HELICOBACTER PYLORI-LIKE ORGANISMS ARE NOT IDENTIFIED ON ROUTINE H&E STAIN       3. 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    Past Medical History:  Diagnosis  Date   Anemia    Arthritis    generalized   Breast cancer (HCC)    Breast cancer of upper-outer quadrant of right female breast (HCC) 03/20/2015   Cataract    Cough    GERD (gastroesophageal reflux disease)    uses PRN meds/with certain foods   Hypertension    Hypothyroidism    Nasal congestion    PONV (postoperative nausea and vomiting)    Rheumatoid arthritis(714.0)    Seasonal allergies    Thyroid  disease    hypothyroidism   Past Surgical History:  Procedure Laterality Date   BREAST LUMPECTOMY WITH RADIOACTIVE SEED LOCALIZATION Right 04/09/2015   Procedure: BREAST LUMPECTOMY WITH RADIOACTIVE SEED LOCALIZATION;  Surgeon: Donnice Bury, MD;  Location: [facility];  Service: General;  Laterality: Right;   BREAST REDUCTION SURGERY  2001   CALCANEAL OSTEOTOMY Left 10/11/2013   Procedure: LEFT CALCANEAL OSTEOTOMY;  Surgeon: Norleen Armor, MD;  Location: [facility];  Service: Orthopedics;  Laterality: Left;   CARPAL TUNNEL RELEASE     left   CERVICAL FUSION  2009   posterior C5-6   COLONOSCOPY     2002,2005,2008 w/Brodie   COLONOSCOPY  2015   Brodie-MAC-prep good-TA- recall 5 yrs   GASTROCNEMIUS RECESSION Left 10/11/2013   Procedure: LEFT GASTROC RECESSION; LEFT POSTERIOR TIBIAL TENOLYSIS;  Surgeon: Norleen Armor, MD;  Location: [facility];  Service: Orthopedics;  Laterality: Left;   KNEE ARTHROSCOPY  2004   right   POLYPECTOMY     REMOVAL OF IMPLANT Left 02/14/2014   Procedure: LEFT FOOT REMOVAL OF DEEP IMPLANT;  Surgeon: Norleen Armor, MD;  Location: [facility];  Service: Orthopedics;  Laterality: Left;   REVERSE SHOULDER ARTHROPLASTY Right 03/05/2021   Procedure: REVERSE SHOULDER ARTHROPLASTY;  Surgeon: Dozier Soulier, MD;  Location: [facility];  Service: Orthopedics;  Laterality: Right;   SHOULDER ARTHROSCOPY W/ ROTATOR CUFF REPAIR  2006   right   THYROIDECTOMY     age 69   TONSILLECTOMY     TOTAL KNEE ARTHROPLASTY  Right 06/21/2016   Procedure: RIGHT TOTAL KNEE ARTHROPLASTY;  Surgeon: Donnice Car, MD;  Location: [facility];  Service: Orthopedics;  Laterality: Right;   TOTAL KNEE ARTHROPLASTY Left 11/01/2023   Procedure: ARTHROPLASTY, KNEE, TOTAL;  Surgeon: Sheril Coy, MD;  Location: [facility];  Service: Orthopedics;  Laterality: Left;  LEFT TOTAL KNEE ARTHOPLASTY   TUBAL LIGATION     WRIST ARTHRODESIS  2003   left     reports that she quit smoking about 39 years ago. Her smoking use included cigarettes. She started smoking about 60 years ago. She has a 5.3 pack-year smoking history. She has never been exposed to tobacco smoke. She has never used smokeless tobacco. She reports that she does not drink alcohol and does not use drugs. family history includes Breast cancer in her cousin and paternal aunt; Cancer in an other family member; Colon cancer in her paternal uncle and another family member; Colon polyps in her brother, brother, and mother; Colon polyps (age of onset: 58) in her son; Heart attack in her father; Heart disease in her father; Hypertension in her mother; Other in her sister; Prostate cancer in her maternal grandfather, maternal uncle, maternal uncle, and maternal uncle; Prostate cancer (age of onset: 64) in her brother. No Known Allergies    Outpatient Encounter Medications as of 01/11/2024  Medication Sig   albuterol  (VENTOLIN  HFA) 108 (90 Base) MCG/ACT inhaler TAKE 2 PUFFS BY MOUTH EVERY 6 HOURS AS NEEDED FOR WHEEZE OR SHORTNESS OF BREATH   aspirin  EC 81 MG tablet Take 1 tablet (81 mg total) by mouth 2 (two) times daily after a meal. For 2 weeks then once a day for 2 weeks for dvt prevention.   cetirizine  (ZYRTEC  ALLERGY ) 10 MG tablet Take 1 tablet (10 mg total) by mouth daily.   Ferric Maltol  (ACCRUFER ) 30 MG CAPS Take 1 capsule (30 mg total) by mouth daily.   folic acid  (FOLVITE ) 1 MG tablet Take 1 mg by mouth daily.   HYDROcodone -acetaminophen  (NORCO/VICODIN) 5-325 MG tablet Take 1-2  tablets by mouth every 6 (six) hours as needed for moderate pain (pain score 4-6) or severe pain (pain score 7-10) (post op pain).   leflunomide  (ARAVA ) 20 MG tablet Take 20 mg by mouth daily.   levothyroxine  (SYNTHROID ) 88 MCG tablet TAKE 1 TABLET BY MOUTH EVERY DAY   montelukast  (SINGULAIR ) 10 MG tablet TAKE 1 TABLET BY MOUTH EVERY DAY   sulfaSALAzine  (AZULFIDINE ) 500 MG tablet Take 1,000 mg by mouth 2 (two) times daily.   tiZANidine  (ZANAFLEX ) 2 MG tablet Take 1-2 tablets (2-4 mg total) by mouth every 6 (six) hours as needed for muscle spasms.   triamcinolone  cream (KENALOG ) 0.1 % Apply 1 Application topically 2 (two) times daily.   No facility-administered encounter medications on  file as of 01/11/2024.    REVIEW OF SYSTEMS  : All other systems reviewed and negative except where noted in the History of Present Illness.   PHYSICAL EXAM: BP 102/60   Pulse (!) 107   Ht 5' 1 (1.549 m)   Wt 139 lb (63 kg)   BMI 26.26 kg/m  General: Well developed female in no acute distress  ASSESSMENT AND PLAN: *Normocytic anemia: Most recent hemoglobin 11 g.  EGD and colonoscopy showed no source of anemia.  FOBT positive stools could have been from hemorrhoids.  No overt sign of GI bleeding.  B12 levels normal.  Iron levels normal.  Would continue to monitor hemoglobin periodically with her PCP.  She can follow-up here as needed. *Grade B esophagitis: Not on any reflux medication.  She denies absolutely any reflux symptoms.  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  In this situation typically would not treat with PPI therapy so will leave her off of any PPI for now since she is asymptomatic in regards to reflux.   CC:  Jarold Medici, MD

## 2024-01-20 ENCOUNTER — Ambulatory Visit: Payer: Self-pay

## 2024-01-24 ENCOUNTER — Other Ambulatory Visit: Payer: Self-pay

## 2024-01-24 DIAGNOSIS — R7612 Nonspecific reaction to cell mediated immunity measurement of gamma interferon antigen response without active tuberculosis: Secondary | ICD-10-CM

## 2024-01-24 NOTE — Progress Notes (Signed)
 Patient advised to have labs done at [facility] in 2 months.  Orders emailed to the patient and also faxed to [facility] on [address] in [place].  Phone: [phone]  Fax: [phone]

## 2024-01-31 ENCOUNTER — Telehealth: Payer: Self-pay | Admitting: Internal Medicine

## 2024-01-31 NOTE — Telephone Encounter (Signed)
 Copied from CRM [id number]. Topic: Clinical - Medication Refill >> Jan 31, 2024  8:41 AM [place] M wrote: Medication: benzonatate  (TESSALON ) 100 MG capsule  Has the patient contacted their pharmacy? Yes (Agent: If no, request that the patient contact the pharmacy for the refill. If patient does not wish to contact the pharmacy document the reason why and proceed with request.) (Agent: If yes, when and what did the pharmacy advise?)  This is the patient's preferred pharmacy:  [facility] [phone] [address], [place] - [address] [place] [address] Phone: [phone] Fax: [phone]    Is this the correct pharmacy for this prescription? Yes If no, delete pharmacy and type the correct one.   Has the prescription been filled recently? No  Is the patient out of the medication? Yes  Has the patient been seen for an appointment in the last year OR does the patient have an upcoming appointment? Yes  Can we respond through MyChart? Yes  Agent: Please be advised that Rx refills may take up to 3 business days. We ask that you follow-up with your pharmacy.

## 2024-01-31 NOTE — Telephone Encounter (Signed)
>>   Jan 31, 2024  8:41 AM [place] M wrote: Medication: benzonatate  (TESSALON ) 100 MG capsule   Has the patient contacted their pharmacy? Yes (Agent: If no, request that the patient contact the pharmacy for the refill. If patient does not wish to contact the pharmacy document the reason why and proceed with request.) (Agent: If yes, when and what did the pharmacy advise?)   This is the patient's preferred pharmacy:  [id number] [address],  - [address] [place] [address] Phone: [phone] Fax: [phone]

## 2024-02-03 ENCOUNTER — Other Ambulatory Visit: Payer: Self-pay | Admitting: Internal Medicine

## 2024-02-03 MED ORDER — BENZONATATE 100 MG PO CAPS: 100.0000 mg | ORAL_CAPSULE | Freq: Two times a day (BID) | ORAL | 1 refills | Status: AC | PRN

## 2024-02-06 DIAGNOSIS — M25562 Pain in left knee: Secondary | ICD-10-CM | POA: Diagnosis not present

## 2024-02-13 DIAGNOSIS — R7612 Nonspecific reaction to cell mediated immunity measurement of gamma interferon antigen response without active tuberculosis: Secondary | ICD-10-CM | POA: Diagnosis not present

## 2024-02-17 ENCOUNTER — Telehealth: Payer: Self-pay | Admitting: Infectious Diseases

## 2024-02-17 NOTE — Telephone Encounter (Signed)
 Called patient to discuss Quantiferon gold test- She had a positive test followed by 2 neg test- but she continues to take Immune modulatory therapy for rheumatoid arthritis- So She needs treatment for latent TB- CXR neg ( 7/28) She is not on leflunamide any more- takes sulfasalazine  and rituximab - will discuss with her rheumatologist on Monday and start RX with rifampin-  Her other meds are singulair  and iron close monitoring of side effects and LFT

## 2024-02-21 ENCOUNTER — Ambulatory Visit (INDEPENDENT_AMBULATORY_CARE_PROVIDER_SITE_OTHER): Admitting: Podiatry

## 2024-02-21 ENCOUNTER — Telehealth: Payer: Self-pay

## 2024-02-21 ENCOUNTER — Encounter: Payer: Self-pay | Admitting: Podiatry

## 2024-02-21 ENCOUNTER — Ambulatory Visit: Admitting: Internal Medicine

## 2024-02-21 DIAGNOSIS — L84 Corns and callosities: Secondary | ICD-10-CM | POA: Diagnosis not present

## 2024-02-21 DIAGNOSIS — Q828 Other specified congenital malformations of skin: Secondary | ICD-10-CM

## 2024-02-21 DIAGNOSIS — M79671 Pain in right foot: Secondary | ICD-10-CM

## 2024-02-21 DIAGNOSIS — M79672 Pain in left foot: Secondary | ICD-10-CM

## 2024-02-21 MED ORDER — RIFAMPIN 300 MG PO CAPS: 600.0000 mg | ORAL_CAPSULE | Freq: Every day | ORAL | 3 refills | Status: AC

## 2024-02-21 NOTE — Telephone Encounter (Signed)
 I spoke to the patient to advise her of medication she will will starting.  Per Dr. Fayette she will need to  starting rifampin  600mg  for the positive blood test and her taking the meds for rheumatoid arthritis- this is to prevent the risk of getting full blown TB- she will take it in the morning with light breakfast- drink plenty of water  and urine will be orange.  Patient advised of all information and verbalized understanding.  Caytlyn Evers ONEIDA Ligas, CMA

## 2024-02-22 NOTE — Progress Notes (Signed)
  Subjective:  Patient ID: Evelyn Orr , female    DOB: 02-08-1946,  MRN: [id number]  Evelyn Orr  presents to clinic today for callus(es) left foot and right foot. Aggravating factors include weightbearing with and without shoe gear. Pain is relieved with periodic professional debridement.  Chief Complaint  Patient presents with   Callouses    RFC Non diabetic callus care. LOV with PCP 11/21/23.   New problem(s): None.   PCP is Jarold Medici, MD.  No Known Allergies  Review of Systems: Negative except as noted in the HPI.  Objective:  There were no vitals filed for this visit. Evelyn Orr  is a pleasant 78 y.o. female WD, WN in NAD. AAO x 3.  Vascular Examination: Vascular status intact b/l with palpable pedal pulses. CFT immediate b/l. Pedal hair present. No edema. No pain with calf compression b/l. Skin temperature gradient WNL b/l. No varicosities noted. No cyanosis or clubbing noted.  Neurological Examination: Sensation grossly intact b/l with 10 gram monofilament. Vibratory sensation intact b/l.  Dermatological Examination: Pedal skin with normal turgor, texture and tone b/l. No open wounds nor interdigital macerations noted.   Toenails 1-5 b/l well maintained with adequate length. No erythema, no edema, no drainage, no fluctuance.   Hyperkeratotic lesion(s) submet head 5 b/l and porokeratosis plantar heel left foot.  No erythema, no edema, no drainage, no fluctuance.   Musculoskeletal Examination: Muscle strength 5/5 to b/l LE.  No pain, crepitus noted b/l. No gross pedal deformities. Patient ambulates independently without assistive aids.   Radiographs: None  Assessment/Plan: 1. Callus   2. Porokeratosis   3. Pain in both feet   -Consent given for treatment as described below: -Examined patient. -Patient to continue soft, supportive shoe gear daily. -Callus(es) submet head 5 b/l pared utilizing sterile scalpel blade without complication or  incident. Total number debrided =2. -Porokeratotic lesion(s) left heel pared and enucleated with sterile currette without incident. Total number of lesions debrided=1. -Patient/POA to call should there be question/concern in the interim.   Return in about 3 months (around 05/22/2024).  Delon LITTIE Merlin, DPM      [facility] LOCATION: [address] [place] [address]                   Office [phone]   [facility] LOCATION: [address] [place], [place] [address] Office [phone]

## 2024-02-23 ENCOUNTER — Ambulatory Visit (INDEPENDENT_AMBULATORY_CARE_PROVIDER_SITE_OTHER): Admitting: Internal Medicine

## 2024-02-23 ENCOUNTER — Encounter: Payer: Self-pay | Admitting: Internal Medicine

## 2024-02-23 VITALS — BP 124/80 | HR 70 | Temp 98.3°F | Ht 61.0 in | Wt 142.2 lb

## 2024-02-23 DIAGNOSIS — M069 Rheumatoid arthritis, unspecified: Secondary | ICD-10-CM | POA: Diagnosis not present

## 2024-02-23 DIAGNOSIS — E039 Hypothyroidism, unspecified: Secondary | ICD-10-CM

## 2024-02-23 DIAGNOSIS — Z227 Latent tuberculosis: Secondary | ICD-10-CM

## 2024-02-23 DIAGNOSIS — E876 Hypokalemia: Secondary | ICD-10-CM

## 2024-02-23 DIAGNOSIS — R030 Elevated blood-pressure reading, without diagnosis of hypertension: Secondary | ICD-10-CM

## 2024-02-23 MED ORDER — ALBUTEROL SULFATE HFA 108 (90 BASE) MCG/ACT IN AERS: 2.0000 | INHALATION_SPRAY | Freq: Four times a day (QID) | RESPIRATORY_TRACT | 1 refills | Status: AC | PRN

## 2024-02-23 NOTE — Progress Notes (Signed)
 I,[name], CMA,acting as a Neurosurgeon for [name], MD.,have documented all relevant documentation on the behalf of [name], MD,as directed by  [name], MD while in the presence of [name], MD.  Subjective:  Patient ID: Evelyn Orr  , female    DOB: [birth year] , 78 y.o.   MRN: [id number]  Chief Complaint  Patient presents with   Hypothyroidism    Patient presents today for thyroid  & bpc. She reports compliance with medications. Denies headache, chest pain & sob. She states being evaluated for possibly having TB. She has been  treated for TB but it has been ruled out that she does not have TB. She was prescribed Rifampin  300MG . She has noticed her urine has changed in color. Once red & brown.    HPI Discussed the use of AI scribe software for clinical note transcription with the patient, who gave verbal consent to proceed.  History of Present Illness Evelyn Orr  is a 78 year old female with rheumatoid arthritis who presents for hypothyroidism f/u and evaluation of latent TB infection.  She has a positive TB blood test, which led to a chest x-ray that did not show active TB. The patient has a positive TB blood test and is on immunosuppressive medication for rheumatoid arthritis.  She recently started treatment with rifampin  for latent TB. She was started on meds by Infectious Disease specialist.  She notes urine discoloration after starting the medication, which she attributes to a chemical reaction with a toilet cleaner. She mistakenly took leflunomide  after starting rifampin  but plans to stop it until further consultation.  Her current medications include Synthroid  88 mcg daily, albuterol  as needed, folic acid , sulfasalazine , and rituximab . She had a negative TB test last week, but is currently continuing treatment for latent TB as previously recommended.  Her family history is significant for heart disease, as her father died of a heart  attack when she was 78 years old. She reports good energy levels and no new symptoms related to her thyroid  condition.   Thyroid  Problem Presents for follow-up visit. Patient reports no cold intolerance, constipation, depressed mood, diaphoresis, diarrhea, heat intolerance, palpitations or tremors. The symptoms have been stable.     Past Medical History:  Diagnosis Date   Anemia    Arthritis    generalized   Breast cancer (HCC)    Breast cancer of upper-outer quadrant of right female breast (HCC) 03/20/2015   Cataract    Cough    GERD (gastroesophageal reflux disease)    uses PRN meds/with certain foods   Hypertension    Hypothyroidism    Nasal congestion    PONV (postoperative nausea and vomiting)    Rheumatoid arthritis(714.0)    Seasonal allergies    Thyroid  disease    hypothyroidism     Family History  Problem Relation Age of Onset   Colon polyps Mother    Hypertension Mother    Heart disease Father    Heart attack Father    Other Sister        high breast density   Colon polyps Brother    Prostate cancer Brother 62       unknown Gleason   Colon polyps Brother    Prostate cancer Maternal Uncle    Prostate cancer Maternal Uncle    Prostate cancer Maternal Uncle    Breast cancer Paternal Aunt        dx. 35 or younger   Colon cancer  Paternal Uncle    Prostate cancer Maternal Grandfather        dx. older age   Colon polyps Son 83       colonoscopy for unspecified reason; found some colon polyps, has not had one since   Breast cancer Cousin        4 maternal cousin had breast cancer    Colon cancer Other    Cancer Other        MGM's sister   Rectal cancer Neg Hx    Stomach cancer Neg Hx    Esophageal cancer Neg Hx      Current Outpatient Medications:    aspirin  EC 81 MG tablet, Take 1 tablet (81 mg total) by mouth 2 (two) times daily after a meal. For 2 weeks then once a day for 2 weeks for dvt prevention., Disp: 45 tablet, Rfl: 0   benzonatate  (TESSALON )  100 MG capsule, Take 1 capsule (100 mg total) by mouth 2 (two) times daily as needed for cough., Disp: 30 capsule, Rfl: 1   cetirizine  (ZYRTEC  ALLERGY ) 10 MG tablet, Take 1 tablet (10 mg total) by mouth daily., Disp: , Rfl:    Ferric Maltol  (ACCRUFER ) 30 MG CAPS, Take 1 capsule (30 mg total) by mouth daily., Disp: 90 capsule, Rfl: 1   folic acid  (FOLVITE ) 1 MG tablet, Take 1 mg by mouth daily., Disp: , Rfl:    HYDROcodone -acetaminophen  (NORCO/VICODIN) 5-325 MG tablet, Take 1-2 tablets by mouth every 6 (six) hours as needed for moderate pain (pain score 4-6) or severe pain (pain score 7-10) (post op pain)., Disp: 30 tablet, Rfl: 0   leflunomide  (ARAVA ) 20 MG tablet, Take 20 mg by mouth daily., Disp: , Rfl:    levothyroxine  (SYNTHROID ) 88 MCG tablet, TAKE 1 TABLET BY MOUTH EVERY DAY, Disp: 90 tablet, Rfl: 0   montelukast  (SINGULAIR ) 10 MG tablet, TAKE 1 TABLET BY MOUTH EVERY DAY, Disp: 90 tablet, Rfl: 2   rifampin  (RIFADIN ) 300 MG capsule, Take 2 capsules (600 mg total) by mouth daily., Disp: 60 capsule, Rfl: 3   sulfaSALAzine  (AZULFIDINE ) 500 MG tablet, Take 1,000 mg by mouth 2 (two) times daily., Disp: , Rfl:    tiZANidine  (ZANAFLEX ) 2 MG tablet, Take 1-2 tablets (2-4 mg total) by mouth every 6 (six) hours as needed for muscle spasms., Disp: 40 tablet, Rfl: 1   triamcinolone  cream (KENALOG ) 0.1 %, Apply 1 Application topically 2 (two) times daily., Disp: 30 g, Rfl: 0   albuterol  (VENTOLIN  HFA) 108 (90 Base) MCG/ACT inhaler, Inhale 2 puffs into the lungs every 6 (six) hours as needed for wheezing or shortness of breath., Disp: 18 each, Rfl: 1   No Known Allergies   Review of Systems  Constitutional: Negative.  Negative for diaphoresis.  Respiratory: Negative.    Cardiovascular: Negative.  Negative for palpitations.  Gastrointestinal:  Negative for constipation and diarrhea.  Endocrine: Negative for cold intolerance and heat intolerance.  Neurological: Negative.  Negative for tremors.   Psychiatric/Behavioral: Negative.       Today's Vitals   02/23/24 1028  BP: 124/80  Pulse: 70  Temp: 98.3 F (36.8 C)  SpO2: 98%  Weight: 142 lb 3.2 oz (64.5 kg)  Height: 5' 1 (1.549 m)   Body mass index is 26.87 kg/m.  Wt Readings from Last 3 Encounters:  02/23/24 142 lb 3.2 oz (64.5 kg)  01/11/24 139 lb (63 kg)  12/28/23 138 lb (62.6 kg)    BP Readings from Last 3 Encounters:  02/23/24 124/80  01/11/24 102/60  12/28/23 135/79     Objective:  Physical Exam Vitals and nursing note reviewed.  Constitutional:      Appearance: Normal appearance.  HENT:     Head: Normocephalic and atraumatic.  Eyes:     Extraocular Movements: Extraocular movements intact.  Cardiovascular:     Rate and Rhythm: Normal rate and regular rhythm.     Heart sounds: Normal heart sounds.  Pulmonary:     Effort: Pulmonary effort is normal.     Breath sounds: Normal breath sounds.  Musculoskeletal:     Cervical back: Normal range of motion.  Skin:    General: Skin is warm.  Neurological:     General: No focal deficit present.     Mental Status: She is alert.  Psychiatric:        Mood and Affect: Mood normal.        Behavior: Behavior normal.         Assessment And Plan:  Primary hypothyroidism Assessment & Plan: On Synthroid  88 mcg daily. - Continue Synthroid  88 mcg daily. - Monitor for symptoms of thyroid  dysfunction, such as fatigue, cold intolerance, diarrhea, or jitteriness. - Check thyroid  function during upcoming physical in January.  Orders: -     CMP14+EGFR -     Lipid panel -     TSH  Latent tuberculosis by blood test Assessment & Plan: Latent TB confirmed by positive blood test. Chest x-ray negative for active TB. Immunosuppression increases risk of TB activation. - Initiate rifampin  to prevent conversion to active TB. - Instruct to stop leflunomide  during rifampin  treatment due to interaction and potential toxic levels. - Advise to contact rheumatologist for  medication management. - Monitor for rifampin  side effects, including urine discoloration. - Educate on importance of completing rifampin  course.   Rheumatoid arthritis involving both feet, unspecified whether rheumatoid factor present (HCC) Assessment & Plan: Currently on immunosuppressive therapy including leflunomide , sulfasalazine , and rituximab . Leflunomide  must be discontinued during rifampin  treatment due to interaction. - Consult rheumatologist regarding management during rifampin  treatment. - Ensure understanding of leflunomide  cessation while on rifampin .   Hypokalemia -     CMP14+EGFR  Other orders -     Albuterol  Sulfate HFA; Inhale 2 puffs into the lungs every 6 (six) hours as needed for wheezing or shortness of breath.  Dispense: 18 each; Refill: 1    Return if symptoms worsen or fail to improve.  Patient was given opportunity to ask questions. Patient verbalized understanding of the plan and was able to repeat key elements of the plan. All questions were answered to their satisfaction.   I, [name], MD, have reviewed all documentation for this visit. The documentation on 03/04/24 for the exam, diagnosis, procedures, and orders are all accurate and complete.   IF YOU HAVE BEEN REFERRED TO A SPECIALIST, IT MAY TAKE 1-2 WEEKS TO SCHEDULE/PROCESS THE REFERRAL. IF YOU HAVE NOT HEARD FROM US /SPECIALIST IN TWO WEEKS, PLEASE GIVE US  A CALL AT [phone] X [phone].   THE PATIENT IS ENCOURAGED TO PRACTICE SOCIAL DISTANCING DUE TO THE COVID-19 PANDEMIC.

## 2024-02-23 NOTE — Patient Instructions (Addendum)
Coronary Calcium Scan A coronary calcium scan is an imaging test used to look for deposits of plaque in the inner lining of the blood vessels of the heart (coronary arteries). Plaque is made up of calcium, protein, and fatty substances. These deposits of plaque can partly clog and narrow the coronary arteries without producing any symptoms or warning signs. This puts a person at risk for a heart attack. A coronary calcium scan is performed using a computed tomography (CT) scanner machine without using a dye (contrast). This test is recommended for people who are at moderate risk for heart disease. The test can find plaque deposits before symptoms develop. Tell a health care provider about: Any allergies you have. All medicines you are taking, including vitamins, herbs, eye drops, creams, and over-the-counter medicines. Any problems you or family members have had with anesthetic medicines. Any bleeding problems you have. Any surgeries you have had. Any medical conditions you have. Whether you are pregnant or may be pregnant. What are the risks? Generally, this is a safe procedure. However, problems may occur, including: Harm to a pregnant woman and her unborn baby. This test involves the use of radiation. Radiation exposure can be dangerous to a pregnant woman and her unborn baby. If you are pregnant or think you may be pregnant, you should not have this procedure done. A slight increase in the risk of cancer. This is because of the radiation involved in the test. The amount of radiation from one test is similar to the amount of radiation you are naturally exposed to over one year. What happens before the procedure? Ask your health care provider for any specific instructions on how to prepare for this procedure. You may be asked to avoid products that contain caffeine, tobacco, or nicotine for 4 hours before the procedure. What happens during the procedure?  You will undress and remove any jewelry  from your neck or chest. You may need to remove hearing aides and dentures. Women may need to remove their bras. You will put on a hospital gown. Sticky electrodes will be placed on your chest. The electrodes will be connected to an electrocardiogram (ECG) machine to record a tracing of the electrical activity of your heart. You will lie down on your back on a curved bed that is attached to the CT scanner. You may be given medicine to slow down your heart rate so that clear pictures can be created. You will be moved into the CT scanner, and the CT scanner will take pictures of your heart. During this time, you will be asked to lie still and hold your breath for 10-20 seconds at a time while each picture of your heart is being taken. The procedure may vary among health care providers and hospitals. What can I expect after the procedure? You can return to your normal activities. It is up to you to get the results of your procedure. Ask your health care provider, or the department that is doing the procedure, when your results will be ready. Summary A coronary calcium scan is an imaging test used to look for deposits of plaque in the inner lining of the blood vessels of the heart. Plaque is made up of calcium, protein, and fatty substances. A coronary calcium scan is performed using a CT scanner machine without contrast. Generally, this is a safe procedure. Tell your health care provider if you are pregnant or may be pregnant. Ask your health care provider for any specific instructions on how to  prepare for this procedure. You can return to your normal activities after the scan is done. This information is not intended to replace advice given to you by your health care provider. Make sure you discuss any questions you have with your health care provider. Document Revised: 04/26/2021 Document Reviewed: 04/26/2021 Elsevier Patient Education  2024 [employer].

## 2024-02-24 LAB — CMP14+EGFR
ALT: 6 IU/L (ref 0–32)
AST: 16 IU/L (ref 0–40)
Albumin: 4 g/dL (ref 3.8–4.8)
Alkaline Phosphatase: 103 IU/L (ref 49–135)
BUN/Creatinine Ratio: 12 (ref 12–28)
BUN: 9 mg/dL (ref 8–27)
Bilirubin Total: 0.8 mg/dL (ref 0.0–1.2)
CO2: 22 mmol/L (ref 20–29)
Calcium: 9.5 mg/dL (ref 8.7–10.3)
Chloride: 100 mmol/L (ref 96–106)
Creatinine, Ser: 0.73 mg/dL (ref 0.57–1.00)
Globulin, Total: 2.8 g/dL (ref 1.5–4.5)
Glucose: 83 mg/dL (ref 70–99)
Potassium: 3.4 mmol/L — ABNORMAL LOW (ref 3.5–5.2)
Sodium: 138 mmol/L (ref 134–144)
Total Protein: 6.8 g/dL (ref 6.0–8.5)
eGFR: 86 mL/min/1.73 (ref >59)

## 2024-02-24 LAB — LIPID PANEL
Chol/HDL Ratio: 2.7 ratio (ref 0.0–4.4)
Cholesterol, Total: 218 mg/dL — ABNORMAL HIGH (ref 100–199)
HDL: 80 mg/dL (ref >39)
LDL Chol Calc (NIH): 120 mg/dL — ABNORMAL HIGH (ref 0–99)
Triglycerides: 101 mg/dL (ref 0–149)
VLDL Cholesterol Cal: 18 mg/dL (ref 5–40)

## 2024-02-24 LAB — TSH: TSH: 3.45 u[IU]/mL (ref 0.450–4.500)

## 2024-02-27 ENCOUNTER — Ambulatory Visit: Payer: Self-pay | Admitting: Internal Medicine

## 2024-02-27 DIAGNOSIS — I129 Hypertensive chronic kidney disease with stage 1 through stage 4 chronic kidney disease, or unspecified chronic kidney disease: Secondary | ICD-10-CM | POA: Diagnosis not present

## 2024-02-27 DIAGNOSIS — Z8249 Family history of ischemic heart disease and other diseases of the circulatory system: Secondary | ICD-10-CM | POA: Diagnosis not present

## 2024-02-27 DIAGNOSIS — E039 Hypothyroidism, unspecified: Secondary | ICD-10-CM | POA: Diagnosis not present

## 2024-02-27 DIAGNOSIS — K219 Gastro-esophageal reflux disease without esophagitis: Secondary | ICD-10-CM | POA: Diagnosis not present

## 2024-02-27 DIAGNOSIS — M069 Rheumatoid arthritis, unspecified: Secondary | ICD-10-CM | POA: Diagnosis not present

## 2024-02-27 DIAGNOSIS — J449 Chronic obstructive pulmonary disease, unspecified: Secondary | ICD-10-CM | POA: Diagnosis not present

## 2024-02-27 DIAGNOSIS — M1990 Unspecified osteoarthritis, unspecified site: Secondary | ICD-10-CM | POA: Diagnosis not present

## 2024-02-27 DIAGNOSIS — N181 Chronic kidney disease, stage 1: Secondary | ICD-10-CM | POA: Diagnosis not present

## 2024-02-27 DIAGNOSIS — J301 Allergic rhinitis due to pollen: Secondary | ICD-10-CM | POA: Diagnosis not present

## 2024-03-04 DIAGNOSIS — Z227 Latent tuberculosis: Secondary | ICD-10-CM | POA: Insufficient documentation

## 2024-03-04 NOTE — Assessment & Plan Note (Signed)
 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 Chest x-ray negative for active TB. Immunosuppression increases risk of TB activation. - Initiate rifampin  to prevent conversion to active TB. - Instruct to stop leflunomide  during rifampin  treatment due to interaction and potential toxic levels. - Advise to contact rheumatologist for medication management. - Monitor for rifampin  side effects, including urine discoloration. - Educate on importance of completing rifampin  course.

## 2024-03-04 NOTE — Assessment & Plan Note (Signed)
 On Synthroid  88 mcg daily. - Continue Synthroid  88 mcg daily. - Monitor for symptoms of thyroid  dysfunction, such as fatigue, cold intolerance, diarrhea, or jitteriness. - Check thyroid  function during upcoming physical in January.

## 2024-03-04 NOTE — Assessment & Plan Note (Signed)
 Currently on immunosuppressive therapy including leflunomide , sulfasalazine , and rituximab . Leflunomide  must be discontinued during rifampin  treatment due to interaction. - Consult rheumatologist regarding management during rifampin  treatment. - Ensure understanding of leflunomide  cessation while on rifampin .

## 2024-03-22 ENCOUNTER — Other Ambulatory Visit: Payer: Self-pay | Admitting: Internal Medicine

## 2024-03-22 MED ORDER — ACCRUFER 30 MG PO CAPS: 1.0000 | ORAL_CAPSULE | Freq: Every day | ORAL | 1 refills | Status: AC

## 2024-03-22 NOTE — Telephone Encounter (Signed)
 Copied from CRM [id number]. Topic: Clinical - Medication Refill >> Mar 22, 2024  9:55 AM Myrick T wrote: Medication:  Ferric Maltol  (ACCRUFER ) 30 MG CAPS   Has the patient contacted their pharmacy? No  This is the patient's preferred pharmacy:  [facility] [phone] [address], [place] - [address] [place] [address] Phone: [phone] Fax: [phone]  Is this the correct pharmacy for this prescription? Yes  Has the prescription been filled recently? Yes  Is the patient out of the medication? No  Has the patient been seen for an appointment in the last year OR does the patient have an upcoming appointment? Yes  Can we respond through MyChart? Yes  Agent: Please be advised that Rx refills may take up to 3 business days. We ask that you follow-up with your pharmacy.

## 2024-03-27 DIAGNOSIS — M8580 Other specified disorders of bone density and structure, unspecified site: Secondary | ICD-10-CM | POA: Diagnosis not present

## 2024-03-27 DIAGNOSIS — M79643 Pain in unspecified hand: Secondary | ICD-10-CM | POA: Diagnosis not present

## 2024-03-27 DIAGNOSIS — Z79899 Other long term (current) drug therapy: Secondary | ICD-10-CM | POA: Diagnosis not present

## 2024-03-27 DIAGNOSIS — M1990 Unspecified osteoarthritis, unspecified site: Secondary | ICD-10-CM | POA: Diagnosis not present

## 2024-03-27 DIAGNOSIS — R7612 Nonspecific reaction to cell mediated immunity measurement of gamma interferon antigen response without active tuberculosis: Secondary | ICD-10-CM | POA: Diagnosis not present

## 2024-03-27 DIAGNOSIS — M25439 Effusion, unspecified wrist: Secondary | ICD-10-CM | POA: Diagnosis not present

## 2024-03-27 DIAGNOSIS — M0589 Other rheumatoid arthritis with rheumatoid factor of multiple sites: Secondary | ICD-10-CM | POA: Diagnosis not present

## 2024-03-27 DIAGNOSIS — M25539 Pain in unspecified wrist: Secondary | ICD-10-CM | POA: Diagnosis not present

## 2024-03-29 ENCOUNTER — Encounter: Payer: Self-pay | Admitting: Internal Medicine

## 2024-03-29 ENCOUNTER — Ambulatory Visit (INDEPENDENT_AMBULATORY_CARE_PROVIDER_SITE_OTHER): Admitting: Internal Medicine

## 2024-03-29 VITALS — BP 118/78 | HR 65 | Temp 98.3°F | Ht 61.0 in | Wt 144.0 lb

## 2024-03-29 DIAGNOSIS — M25512 Pain in left shoulder: Secondary | ICD-10-CM

## 2024-03-29 DIAGNOSIS — G8929 Other chronic pain: Secondary | ICD-10-CM | POA: Diagnosis not present

## 2024-03-29 DIAGNOSIS — R519 Headache, unspecified: Secondary | ICD-10-CM | POA: Diagnosis not present

## 2024-03-29 MED ORDER — BACLOFEN 10 MG PO TABS: ORAL_TABLET | ORAL | 0 refills | Status: DC

## 2024-03-29 NOTE — Patient Instructions (Signed)
 Trigeminal Neuralgia  Trigeminal neuralgia is a nerve disorder that causes severe pain on one side of the face. The pain may last from a few seconds to several minutes, but it can happen hundreds of times a day. The pain is usually only on one side of the face. Symptoms may occur for days, weeks, or months and then go away for months or years. The pain may return and be worse than before. What are the causes? This condition may be caused by: Damage or pressure to a nerve in the head that is called the trigeminal nerve. An attack can be triggered by: Talking or chewing. Putting on makeup. Washing, shaving, or touching your face. Brushing your teeth. Blasts of hot or cold air. Primary demyelinating disorders, such as multiple sclerosis. Tumors. What increases the risk? You are more likely to develop this condition if: You are 18-63 years old. You are female. What are the signs or symptoms? The main symptom of this condition is severe pain in the jaw, lips, eyes, nose, scalp, forehead, and face. How is this diagnosed? This condition is diagnosed with a physical exam. A CT scan or an MRI may be done to rule out other conditions that can cause facial pain. How is this treated? This condition may be treated with: Measures to avoid the things that trigger your symptoms. Prescription medicines such as anticonvulsants. Procedures such as ablation, thermal, or radiation therapy. Cognitive or behavioral therapy. Complementary therapies such as: Gentle, regular exercise or yoga. Meditation. Aromatherapy. Acupuncture. Surgery. This may be done in severe cases if other medical treatment does not provide relief. It may take up to one month for treatment to start relieving the pain. Follow these instructions at home: Managing pain  Learn as much as you can about how to manage your pain. Ask your health care provider if a pain specialist would be helpful. Consider talking with a mental health  care provider about how to cope with the pain. Consider joining a pain support group. General instructions Take over-the-counter and prescription medicines only as told by your health care provider. Avoid the things that trigger your symptoms. It may help to: Chew on the unaffected side of your mouth. Avoid touching your face. Avoid blasts of hot or cold air. Keep all follow-up visits. Where to find more information Facial Pain Association: facepain.org Contact a health care provider if: Your medicine is not helping your symptoms. You have side effects from the medicine used for treatment. You develop new, unexplained symptoms, such as: Double vision. Facial weakness or numbness. Changes in hearing or balance. You feel depressed. Get help right away if: Your pain is severe and is not getting better. You develop suicidal thoughts. If you ever feel like you may hurt yourself or others, or have thoughts about taking your own life, get help right away. Go to your nearest emergency department or: Call your local emergency services (911 in the [place] Call a suicide crisis helpline, such as the National Suicide Prevention Lifeline at 517-026-2744 or 988 in the [place] This is open 24 hours a day in the [place] If you're a Veteran: Call 988 and press 1. This is open 24 hours a day. Text the [employer] at 308-596-6075. Summary Trigeminal neuralgia is a nerve disorder that causes severe pain on one side of the face. The pain may last from a few seconds to several minutes. This condition is caused by damage or pressure to a nerve in the head that is called  the trigeminal nerve. Treatment may include avoiding the things that trigger your symptoms, taking medicines, or having procedures or surgery. It may take up to one month for treatment to start relieving the pain. Keep all follow-up visits. This information is not intended to replace advice given to you by your health care provider. Make sure  you discuss any questions you have with your health care provider. Document Revised: 12/30/2022 Document Reviewed: 11/10/2020 Elsevier Patient Education  2025 [employer].

## 2024-03-29 NOTE — Progress Notes (Signed)
 I,Evelyn Orr, CMA,acting as a neurosurgeon for Evelyn LOISE Slocumb, MD.,have documented all relevant documentation on the behalf of Evelyn LOISE Slocumb, MD,as directed by  Evelyn LOISE Slocumb, MD while in the presence of Evelyn LOISE Slocumb, MD.  Subjective:  Patient ID: Evelyn Orr  , female    DOB: 1945-12-13 , 78 y.o.   MRN: [id number]  Chief Complaint  Patient presents with   Facial Pain    Patient presents today for facial pain on the left side. She states this started last week. She figured pain would go away but it has not. She denies taking anything OTC.  Mammogram scheduled for Dec.      HPI Discussed the use of AI scribe software for clinical note transcription with the patient, who gave verbal consent to proceed.  History of Present Illness Evelyn Orr  is a 78 year old female who presents with left-sided facial pain.  She experiences sharp, shooting pain on the left side of her face, sometimes extending to her eye. The pain began approximately one week ago while watching TV and is described as very sharp, lasting less than a minute. She has not taken any medication for the pain and has not visited a dentist recently.  No visual changes, speech problems, or pain triggered by chewing food, brushing teeth, or gum chewing. The pain does not affect her chin and primarily occurs in the jaw and temporal area.  She sometimes experiences headaches, but she is not aware of being stressed. She is a light sleeper and does not sleep through the night.  Her social history includes a mention of her husband, who is currently in [place]  for work. She also uses a heating pad for shoulder pain and has previously seen a chiropractor for massage therapy, which she found beneficial.   HPI   Past Medical History:  Diagnosis Date   Anemia    Arthritis    generalized   Breast cancer (HCC)    Breast cancer of upper-outer quadrant of right female breast (HCC) 03/20/2015   Cataract     Cough    GERD (gastroesophageal reflux disease)    uses PRN meds/with certain foods   Hypertension    Hypothyroidism    Nasal congestion    PONV (postoperative nausea and vomiting)    Rheumatoid arthritis(714.0)    Seasonal allergies    Thyroid  disease    hypothyroidism     Family History  Problem Relation Age of Onset   Colon polyps Mother    Hypertension Mother    Heart disease Father    Heart attack Father    Other Sister        high breast density   Colon polyps Brother    Prostate cancer Brother 45       unknown Gleason   Colon polyps Brother    Prostate cancer Maternal Uncle    Prostate cancer Maternal Uncle    Prostate cancer Maternal Uncle    Breast cancer Paternal Aunt        dx. 35 or younger   Colon cancer Paternal Uncle    Prostate cancer Maternal Grandfather        dx. older age   Colon polyps Son 33       colonoscopy for unspecified reason; found some colon polyps, has not had one since   Breast cancer Cousin        4 maternal cousin had breast cancer    Colon cancer Other  Cancer Other        MGM's sister   Rectal cancer Neg Hx    Stomach cancer Neg Hx    Esophageal cancer Neg Hx      Current Outpatient Medications:    albuterol  (VENTOLIN  HFA) 108 (90 Base) MCG/ACT inhaler, Inhale 2 puffs into the lungs every 6 (six) hours as needed for wheezing or shortness of breath., Disp: 18 each, Rfl: 1   aspirin  EC 81 MG tablet, Take 1 tablet (81 mg total) by mouth 2 (two) times daily after a meal. For 2 weeks then once a day for 2 weeks for dvt prevention., Disp: 45 tablet, Rfl: 0   baclofen (LIORESAL) 10 MG tablet, One tab po at bedtime prn, Disp: 30 tablet, Rfl: 0   benzonatate  (TESSALON ) 100 MG capsule, Take 1 capsule (100 mg total) by mouth 2 (two) times daily as needed for cough., Disp: 30 capsule, Rfl: 1   cetirizine  (ZYRTEC  ALLERGY ) 10 MG tablet, Take 1 tablet (10 mg total) by mouth daily., Disp: , Rfl:    Ferric Maltol  (ACCRUFER ) 30 MG CAPS, Take 1  capsule (30 mg total) by mouth daily., Disp: 90 capsule, Rfl: 1   folic acid  (FOLVITE ) 1 MG tablet, Take 1 mg by mouth daily., Disp: , Rfl:    HYDROcodone -acetaminophen  (NORCO/VICODIN) 5-325 MG tablet, Take 1-2 tablets by mouth every 6 (six) hours as needed for moderate pain (pain score 4-6) or severe pain (pain score 7-10) (post op pain)., Disp: 30 tablet, Rfl: 0   leflunomide  (ARAVA ) 20 MG tablet, Take 20 mg by mouth daily., Disp: , Rfl:    levothyroxine  (SYNTHROID ) 88 MCG tablet, TAKE 1 TABLET BY MOUTH EVERY DAY, Disp: 90 tablet, Rfl: 0   montelukast  (SINGULAIR ) 10 MG tablet, TAKE 1 TABLET BY MOUTH EVERY DAY, Disp: 90 tablet, Rfl: 2   rifampin  (RIFADIN ) 300 MG capsule, Take 2 capsules (600 mg total) by mouth daily., Disp: 60 capsule, Rfl: 3   sulfaSALAzine  (AZULFIDINE ) 500 MG tablet, Take 1,000 mg by mouth 2 (two) times daily., Disp: , Rfl:    tiZANidine  (ZANAFLEX ) 2 MG tablet, Take 1-2 tablets (2-4 mg total) by mouth every 6 (six) hours as needed for muscle spasms., Disp: 40 tablet, Rfl: 1   triamcinolone  cream (KENALOG ) 0.1 %, Apply 1 Application topically 2 (two) times daily., Disp: 30 g, Rfl: 0   No Known Allergies   Review of Systems  Constitutional: Negative.   Respiratory: Negative.    Cardiovascular: Negative.   Musculoskeletal:  Positive for myalgias.  Neurological:  Positive for headaches.  Psychiatric/Behavioral: Negative.       Today's Vitals   03/29/24 0952  BP: 118/78  Pulse: 65  Temp: 98.3 F (36.8 C)  SpO2: 98%  Weight: 144 lb (65.3 kg)  Height: 5' 1 (1.549 m)   Body mass index is 27.21 kg/m.  Wt Readings from Last 3 Encounters:  03/29/24 144 lb (65.3 kg)  02/23/24 142 lb 3.2 oz (64.5 kg)  01/11/24 139 lb (63 kg)     Objective:  Physical Exam Vitals and nursing note reviewed.  Constitutional:      Appearance: Normal appearance.  HENT:     Head: Normocephalic and atraumatic.     Comments: Left jaw tenderness to palpation Eyes:     Extraocular  Movements: Extraocular movements intact.  Cardiovascular:     Rate and Rhythm: Normal rate and regular rhythm.     Heart sounds: Normal heart sounds.  Pulmonary:     Effort:  Pulmonary effort is normal.     Breath sounds: Normal breath sounds.  Musculoskeletal:     Cervical back: Normal range of motion.  Skin:    General: Skin is warm.  Neurological:     General: No focal deficit present.     Mental Status: She is alert.  Psychiatric:        Mood and Affect: Mood normal.        Behavior: Behavior normal.         Assessment And Plan:  Face pain Assessment & Plan: Symptoms are suggestive of TMJ.  Intermittent sharp pain in left jaw and temporal area consistent with TMJ disorder, likely exacerbated by chewing gum and teeth grinding. Stress or muscle tension may contribute. - Advise against chewing gum and chewy foods. - Recommend jaw massage with Vicks VapoRub twice daily. - Refer to dentist for teeth grinding evaluation and night guard consideration. - Prescribe muscle relaxer at night for relief. - Arrange follow-up with chiropractor for muscle tension management.   Nonintractable headache, unspecified chronicity pattern, unspecified headache type Assessment & Plan: Recent headaches likely related to TMJ disorder and jaw/neck muscle tension. - Address TMJ disorder as primary headache cause. - Coordinate with chiropractor for muscle tension management. - Blood pressure is normal   Chronic left shoulder pain Assessment & Plan: Chronic left shoulder pain possibly due to muscle tension, previously improved with chiropractic care. - Coordinate with chiropractor for left shoulder pain management.   Other orders -     Baclofen; One tab po at bedtime prn  Dispense: 30 tablet; Refill: 0    Return if symptoms worsen or fail to improve.  Patient was given opportunity to ask questions. Patient verbalized understanding of the plan and was able to repeat key elements of the plan.  All questions were answered to their satisfaction.   I, Evelyn LOISE Slocumb, MD, have reviewed all documentation for this visit. The documentation on 03/29/24 for the exam, diagnosis, procedures, and orders are all accurate and complete.   IF YOU HAVE BEEN REFERRED TO A SPECIALIST, IT MAY TAKE 1-2 WEEKS TO SCHEDULE/PROCESS THE REFERRAL. IF YOU HAVE NOT HEARD FROM US /SPECIALIST IN TWO WEEKS, PLEASE GIVE US  A CALL AT [phone].   THE PATIENT IS ENCOURAGED TO PRACTICE SOCIAL DISTANCING DUE TO THE COVID-19 PANDEMIC.

## 2024-03-29 NOTE — Assessment & Plan Note (Signed)
 Chronic left shoulder pain possibly due to muscle tension, previously improved with chiropractic care. - Coordinate with chiropractor for left shoulder pain management.

## 2024-03-29 NOTE — Assessment & Plan Note (Signed)
 Symptoms are suggestive of TMJ.  Intermittent sharp pain in left jaw and temporal area consistent with TMJ disorder, likely exacerbated by chewing gum and teeth grinding. Stress or muscle tension may contribute. - Advise against chewing gum and chewy foods. - Recommend jaw massage with Vicks VapoRub twice daily. - Refer to dentist for teeth grinding evaluation and night guard consideration. - Prescribe muscle relaxer at night for relief. - Arrange follow-up with chiropractor for muscle tension management.

## 2024-03-29 NOTE — Assessment & Plan Note (Signed)
 Recent headaches likely related to TMJ disorder and jaw/neck muscle tension. - Address TMJ disorder as primary headache cause. - Coordinate with chiropractor for muscle tension management. - Blood pressure is normal

## 2024-05-01 ENCOUNTER — Ambulatory Visit: Attending: Infectious Diseases | Admitting: Infectious Diseases

## 2024-05-01 DIAGNOSIS — Z96611 Presence of right artificial shoulder joint: Secondary | ICD-10-CM | POA: Diagnosis not present

## 2024-05-01 DIAGNOSIS — D849 Immunodeficiency, unspecified: Secondary | ICD-10-CM

## 2024-05-01 DIAGNOSIS — D84821 Immunodeficiency due to drugs: Secondary | ICD-10-CM | POA: Diagnosis not present

## 2024-05-01 DIAGNOSIS — Z227 Latent tuberculosis: Secondary | ICD-10-CM

## 2024-05-01 DIAGNOSIS — R7612 Nonspecific reaction to cell mediated immunity measurement of gamma interferon antigen response without active tuberculosis: Secondary | ICD-10-CM

## 2024-05-01 DIAGNOSIS — Z79890 Hormone replacement therapy (postmenopausal): Secondary | ICD-10-CM | POA: Diagnosis not present

## 2024-05-01 DIAGNOSIS — Z96653 Presence of artificial knee joint, bilateral: Secondary | ICD-10-CM | POA: Diagnosis not present

## 2024-05-01 DIAGNOSIS — E039 Hypothyroidism, unspecified: Secondary | ICD-10-CM | POA: Diagnosis not present

## 2024-05-01 DIAGNOSIS — Z96612 Presence of left artificial shoulder joint: Secondary | ICD-10-CM | POA: Insufficient documentation

## 2024-05-01 DIAGNOSIS — M0579 Rheumatoid arthritis with rheumatoid factor of multiple sites without organ or systems involvement: Secondary | ICD-10-CM | POA: Diagnosis not present

## 2024-05-01 NOTE — Progress Notes (Addendum)
 NAME: Evelyn Orr   DOB: 10/25/1945  MRN: [id number]  Date/Time: 05/01/2024 12:38 PM   Subjective:   The purpose of this virtual visit is to provide medical care while limiting exposure to the novel coronavirus (COVID19) for both patient and office staff.   Consent was obtained for Tele visit:  Yes.   Answered questions that patient had about telehealth interaction:  Yes.   I discussed the limitations, risks, security and privacy concerns of performing an evaluation and management service by video. I also discussed with the patient that there may be a patient responsible charge related to this service. The patient expressed understanding and agreed to proceed.   Patient Location: [facility] Provider Location: office  Follow up for positive quantiferon Gold  Has been on rifampin  600mg  every day since Oct 2025 No side effects Tolerating well Following taken from note last visit  Evelyn Orr  is a 78 y.o. with a history of , rt reverse shoulder arthroplasty, b/l TKA is referred for  positive Quantiferon Gold test by her rheumatologist Dr.Govinda Aryal.  She has a history of rheumatoid arthritis diagnosed approximately twelve years ago. Her treatment regimen has included methotrexate, hydroxychloroquine,   leflunomide , and sulfasalazine . She currently takes sulfasalazine  and leflunomide , with the last infusion of rituximab  in September . She has undergone joint replacements, including both knees and the right shoulder. Her symptoms include joint pain in the hands, shoulders, neck, and knees. She recently received a steroid injection for flare up.  She has a history of a positive Quantiferon Gold test. She did not know her previous tests She denies known contact with anyone diagnosed with TB. She retired from [employer]- She volunteers weekly, distributing food to the homeless,  She lives with her husband, who is retired, and her 41 year old granddaughter, who attends school.. No anemia,  diabetes, high blood pressure, heart problems, asthma, or COPD.  Past Medical History:  Diagnosis Date   Anemia    Arthritis    generalized   Breast cancer (HCC)    Breast cancer of upper-outer quadrant of right female breast (HCC) 03/20/2015   Cataract    Cough    GERD (gastroesophageal reflux disease)    uses PRN meds/with certain foods   Hypertension    Hypothyroidism    Nasal congestion    PONV (postoperative nausea and vomiting)    Rheumatoid arthritis(714.0)    Seasonal allergies    Thyroid  disease    hypothyroidism    Past Surgical History:  Procedure Laterality Date   BREAST LUMPECTOMY WITH RADIOACTIVE SEED LOCALIZATION Right 04/09/2015   Procedure: BREAST LUMPECTOMY WITH RADIOACTIVE SEED LOCALIZATION;  Surgeon: Donnice Bury, MD;  Location: [facility];  Service: General;  Laterality: Right;   BREAST REDUCTION SURGERY  2001   CALCANEAL OSTEOTOMY Left 10/11/2013   Procedure: LEFT CALCANEAL OSTEOTOMY;  Surgeon: Norleen Armor, MD;  Location: [facility];  Service: Orthopedics;  Laterality: Left;   CARPAL TUNNEL RELEASE     left   CERVICAL FUSION  2009   posterior C5-6   COLONOSCOPY     2002,2005,2008 w/Brodie   COLONOSCOPY  2015   Brodie-MAC-prep good-TA- recall 5 yrs   GASTROCNEMIUS RECESSION Left 10/11/2013   Procedure: LEFT GASTROC RECESSION; LEFT POSTERIOR TIBIAL TENOLYSIS;  Surgeon: Norleen Armor, MD;  Location: [facility];  Service: Orthopedics;  Laterality: Left;   KNEE ARTHROSCOPY  2004   right   POLYPECTOMY     REMOVAL OF IMPLANT Left 02/14/2014   Procedure:  LEFT FOOT REMOVAL OF DEEP IMPLANT;  Surgeon: Norleen Armor, MD;  Location: [facility];  Service: Orthopedics;  Laterality: Left;   REVERSE SHOULDER ARTHROPLASTY Right 03/05/2021   Procedure: REVERSE SHOULDER ARTHROPLASTY;  Surgeon: Dozier Soulier, MD;  Location: [facility];  Service: Orthopedics;  Laterality: Right;   SHOULDER ARTHROSCOPY W/ ROTATOR  CUFF REPAIR  2006   right   THYROIDECTOMY     age 60   TONSILLECTOMY     TOTAL KNEE ARTHROPLASTY Right 06/21/2016   Procedure: RIGHT TOTAL KNEE ARTHROPLASTY;  Surgeon: Donnice Car, MD;  Location: [facility];  Service: Orthopedics;  Laterality: Right;   TOTAL KNEE ARTHROPLASTY Left 11/01/2023   Procedure: ARTHROPLASTY, KNEE, TOTAL;  Surgeon: Sheril Coy, MD;  Location: [facility];  Service: Orthopedics;  Laterality: Left;  LEFT TOTAL KNEE ARTHOPLASTY   TUBAL LIGATION     WRIST ARTHRODESIS  2003   left     Social History   Socioeconomic History   Marital status: Married    Spouse name: Not on file   Number of children: 2   Years of education: Not on file   Highest education level: Not on file  Occupational History   Occupation: retired  Tobacco Use   Smoking status: Former    Current packs/day: 0.00    Average packs/day: 0.3 packs/day for 21.0 years (5.3 ttl pk-yrs)    Types: Cigarettes    Start date: 02/13/1963    Quit date: 02/13/1984    Years since quitting: 40.2    Passive exposure: Never   Smokeless tobacco: Never  Vaping Use   Vaping status: Never Used  Substance and Sexual Activity   Alcohol use: No   Drug use: No   Sexual activity: Not Currently    Birth control/protection: Post-menopausal  Other Topics Concern   Not on file  Social History Narrative   Not on file   Social Drivers of Health   Financial Resource Strain: Low Risk  (07/29/2023)   Overall Financial Resource Strain (CARDIA)    Difficulty of Paying Living Expenses: Not hard at all  Food Insecurity: No Food Insecurity (11/01/2023)   Hunger Vital Sign    Worried About Running Out of Food in the Last Year: Never true    Ran Out of Food in the Last Year: Never true  Transportation Needs: No Transportation Needs (11/01/2023)   PRAPARE - Administrator, Civil Service (Medical): No    Lack of Transportation (Non-Medical): No  Physical Activity: Sufficiently Active (07/29/2023)   Exercise Vital Sign     Days of Exercise per Week: 4 days    Minutes of Exercise per Session: 60 min  Stress: No Stress Concern Present (07/29/2023)   [employer] of Occupational Health - Occupational Stress Questionnaire    Feeling of Stress : Not at all  Social Connections: Socially Integrated (11/01/2023)   Social Connection and Isolation Panel    Frequency of Communication with Friends and Family: More than three times a week    Frequency of Social Gatherings with Friends and Family: More than three times a week    Attends Religious Services: More than 4 times per year    Active Member of [employer] or Organizations: No    Attends Engineer, Structural: More than 4 times per year    Marital Status: Married  Catering Manager Violence: Not At Risk (11/01/2023)   Humiliation, Afraid, Rape, and Kick questionnaire    Fear of Current or Ex-Partner: No  Emotionally Abused: No    Physically Abused: No    Sexually Abused: No    Family History  Problem Relation Age of Onset   Colon polyps Mother    Hypertension Mother    Heart disease Father    Heart attack Father    Other Sister        high breast density   Colon polyps Brother    Prostate cancer Brother 34       unknown Gleason   Colon polyps Brother    Prostate cancer Maternal Uncle    Prostate cancer Maternal Uncle    Prostate cancer Maternal Uncle    Breast cancer Paternal Aunt        dx. 35 or younger   Colon cancer Paternal Uncle    Prostate cancer Maternal Grandfather        dx. older age   Colon polyps Son 55       colonoscopy for unspecified reason; found some colon polyps, has not had one since   Breast cancer Cousin        4 maternal cousin had breast cancer    Colon cancer Other    Cancer Other        MGM's sister   Rectal cancer Neg Hx    Stomach cancer Neg Hx    Esophageal cancer Neg Hx    No Known Allergies I? Current Outpatient Medications  Medication Sig Dispense Refill   albuterol  (VENTOLIN  HFA) 108 (90 Base)  MCG/ACT inhaler Inhale 2 puffs into the lungs every 6 (six) hours as needed for wheezing or shortness of breath. 18 each 1   aspirin  EC 81 MG tablet Take 1 tablet (81 mg total) by mouth 2 (two) times daily after a meal. For 2 weeks then once a day for 2 weeks for dvt prevention. 45 tablet 0   baclofen  (LIORESAL ) 10 MG tablet One tab po at bedtime prn 30 tablet 0   benzonatate  (TESSALON ) 100 MG capsule Take 1 capsule (100 mg total) by mouth 2 (two) times daily as needed for cough. 30 capsule 1   cetirizine  (ZYRTEC  ALLERGY ) 10 MG tablet Take 1 tablet (10 mg total) by mouth daily.     Ferric Maltol  (ACCRUFER ) 30 MG CAPS Take 1 capsule (30 mg total) by mouth daily. 90 capsule 1   folic acid  (FOLVITE ) 1 MG tablet Take 1 mg by mouth daily.     HYDROcodone -acetaminophen  (NORCO/VICODIN) 5-325 MG tablet Take 1-2 tablets by mouth every 6 (six) hours as needed for moderate pain (pain score 4-6) or severe pain (pain score 7-10) (post op pain). 30 tablet 0   leflunomide  (ARAVA ) 20 MG tablet Take 20 mg by mouth daily.     levothyroxine  (SYNTHROID ) 88 MCG tablet TAKE 1 TABLET BY MOUTH EVERY DAY 90 tablet 0   montelukast  (SINGULAIR ) 10 MG tablet TAKE 1 TABLET BY MOUTH EVERY DAY 90 tablet 2   rifampin  (RIFADIN ) 300 MG capsule Take 2 capsules (600 mg total) by mouth daily. 60 capsule 3   sulfaSALAzine  (AZULFIDINE ) 500 MG tablet Take 1,000 mg by mouth 2 (two) times daily.     tiZANidine  (ZANAFLEX ) 2 MG tablet Take 1-2 tablets (2-4 mg total) by mouth every 6 (six) hours as needed for muscle spasms. 40 tablet 1   triamcinolone  cream (KENALOG ) 0.1 % Apply 1 Application topically 2 (two) times daily. 30 g 0   No current facility-administered medications for this visit.     Abtx:  Anti-infectives (From admission,  onward)    None       REVIEW OF SYSTEMS:  Const: negative fever, negative chills, negative weight loss Eyes: negative diplopia or visual changes, negative eye pain ENT: negative coryza, negative sore  throat Resp: negative cough, hemoptysis, dyspnea Cards: negative for chest pain, palpitations, lower extremity edema GU: negative for frequency, dysuria and hematuria GI: Negative for abdominal pain, diarrhea, bleeding, constipation Skin: negative for rash and pruritus Heme: negative for easy bruising and gum/nose bleeding FD:floupeoz joints pain and stiffness- hands, shoulders, knee, neck Neurolo:negative for headaches, dizziness, vertigo, memory problems  Psych: negative for feelings of anxiety, depression  Endocrine: negative for thyroid , diabetes Allergy /Immunology- negative for any medication or food allergies ?  Objective:  VITALS:  There were no vitals taken for this visit.  PHYSICAL EXAM:  Looks well Pertinent Labs Lab Results CBC    Component Value Date/Time   WBC 6.9 10/27/2023 0849   RBC 3.84 (L) 10/27/2023 0849   HGB 11.3 (L) 10/27/2023 0849   HGB 12.0 08/31/2023 1142   HGB 12.6 12/16/2016 1245   HCT 36.8 10/27/2023 0849   HCT 38.0 08/31/2023 1142   HCT 38.2 12/16/2016 1245   PLT 324 10/27/2023 0849   PLT 217 08/31/2023 1142   MCV 95.8 10/27/2023 0849   MCV 93 08/31/2023 1142   MCV 93.4 12/16/2016 1245   MCH 29.4 10/27/2023 0849   MCHC 30.7 10/27/2023 0849   RDW 12.6 10/27/2023 0849   RDW 11.8 08/31/2023 1142   RDW 16.2 (H) 12/16/2016 1245   LYMPHSABS 2.3 02/08/2022 0823   LYMPHSABS 2.3 12/28/2021 1258   LYMPHSABS 2.9 12/16/2016 1245   MONOABS 0.4 02/08/2022 0823   MONOABS 0.2 12/16/2016 1245   EOSABS 0.0 02/08/2022 0823   EOSABS 0.0 12/28/2021 1258   BASOSABS 0.1 02/08/2022 0823   BASOSABS 0.0 12/28/2021 1258   BASOSABS 0.0 12/16/2016 1245       Latest Ref Rng & Units 02/23/2024   11:16 AM 11/21/2023    9:17 AM 10/27/2023    8:49 AM  CMP  Glucose 70 - 99 mg/dL 83  73  890   BUN 8 - 27 mg/dL 9  8  12    Creatinine 0.57 - 1.00 mg/dL 9.26  9.29  9.38   Sodium 134 - 144 mmol/L 138  138  137   Potassium 3.5 - 5.2 mmol/L 3.4  3.3  3.4   Chloride 96  - 106 mmol/L 100  98  102   CO2 20 - 29 mmol/L 22  21  24    Calcium 8.7 - 10.3 mg/dL 9.5  9.4  9.2   Total Protein 6.0 - 8.5 g/dL 6.8   7.6   Total Bilirubin 0.0 - 1.2 mg/dL 0.8   0.8   Alkaline Phos 49 - 135 IU/L 103   87   AST 0 - 40 IU/L 16   23   ALT 0 - 32 IU/L 6   11     LABS   Quantiferon Gold: positive   Erythrocyte sedimentation rate (ESR): 119 mm/hr (12/22/2023)   Hemoglobin: 11 g/dL (92/75/7974)   White blood cell count: 10.7 x 10^3/L (12/22/2023)   Platelet count: 361 x 10^3/L (12/22/2023)   Glucose: 95 mg/dL (92/75/7974)   Creatinine: 0.80 mg/dL (92/75/7974)   Glomerular filtration rate (GFR): 77 mL/min/1.73 m (12/22/2023)   Alanine aminotransferase (ALT): 17 U/L (12/22/2023)   Aspartate aminotransferase (AST): 7 U/L (12/22/2023)   Alkaline phosphatase: 121 U/L (12/22/2023)   Bilirubin: 0.3 mg/dL (92/75/7974)  C-reactive protein (CRP): 29 mg/L (12/22/2023)   Hepatitis B surface antigen: negative (12/22/2023)   Hepatitis B surface antibody: nonreactive (12/22/2023)   Hepatitis B core antibody: negative (12/22/2023)   Hepatitis C antibody: nonreactive (12/22/2023)      IMAGING RESULTS: 10/27/23 CXR no infiltrate I have personally reviewed the films ? 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 A recent steroid injection was administered for joint pain. High sed rate and C-reactive protein levels indicate active inflammation. Current medications include leflunomide  and sulfasalazine .? rituximab  on hold ?  Hypothyroidism on synthroid   Discussed with patient in detail Discussed with Dr.Aryal  Total time  spent on this video visit 20 min  ________________________________________________

## 2024-05-02 ENCOUNTER — Other Ambulatory Visit: Payer: Self-pay

## 2024-05-02 DIAGNOSIS — R7612 Nonspecific reaction to cell mediated immunity measurement of gamma interferon antigen response without active tuberculosis: Secondary | ICD-10-CM

## 2024-05-03 ENCOUNTER — Other Ambulatory Visit: Payer: Self-pay

## 2024-05-03 ENCOUNTER — Other Ambulatory Visit

## 2024-05-03 ENCOUNTER — Other Ambulatory Visit: Payer: Self-pay | Admitting: Internal Medicine

## 2024-05-03 DIAGNOSIS — R7612 Nonspecific reaction to cell mediated immunity measurement of gamma interferon antigen response without active tuberculosis: Secondary | ICD-10-CM

## 2024-05-06 LAB — QUANTIFERON-TB GOLD PLUS
Mitogen-NIL: 3.99 [IU]/mL
NIL: 3.16 [IU]/mL
QuantiFERON-TB Gold Plus: POSITIVE — AB
TB1-NIL: 2.46 [IU]/mL
TB2-NIL: 1.88 [IU]/mL

## 2024-05-08 ENCOUNTER — Ambulatory Visit: Payer: Self-pay

## 2024-05-08 ENCOUNTER — Other Ambulatory Visit: Payer: Self-pay

## 2024-05-08 DIAGNOSIS — R7612 Nonspecific reaction to cell mediated immunity measurement of gamma interferon antigen response without active tuberculosis: Secondary | ICD-10-CM

## 2024-05-09 ENCOUNTER — Other Ambulatory Visit

## 2024-05-09 ENCOUNTER — Other Ambulatory Visit: Payer: Self-pay

## 2024-05-09 DIAGNOSIS — R7612 Nonspecific reaction to cell mediated immunity measurement of gamma interferon antigen response without active tuberculosis: Secondary | ICD-10-CM

## 2024-05-10 LAB — COMPREHENSIVE METABOLIC PANEL WITH GFR
AG Ratio: 1.1 (calc) (ref 1.0–2.5)
ALT: 4 U/L — ABNORMAL LOW (ref 6–29)
AST: 13 U/L (ref 10–35)
Albumin: 3.8 g/dL (ref 3.6–5.1)
Alkaline phosphatase (APISO): 93 U/L (ref 37–153)
BUN: 8 mg/dL (ref 7–25)
CO2: 26 mmol/L (ref 20–32)
Calcium: 9.1 mg/dL (ref 8.6–10.4)
Chloride: 103 mmol/L (ref 98–110)
Creat: 0.69 mg/dL (ref 0.60–1.00)
Globulin: 3.5 g/dL (ref 1.9–3.7)
Glucose, Bld: 102 mg/dL — ABNORMAL HIGH (ref 65–99)
Potassium: 3.5 mmol/L (ref 3.5–5.3)
Sodium: 139 mmol/L (ref 135–146)
Total Bilirubin: 0.3 mg/dL (ref 0.2–1.2)
Total Protein: 7.3 g/dL (ref 6.1–8.1)
eGFR: 90 mL/min/1.73m2 (ref >=60)

## 2024-05-10 LAB — CBC WITH DIFFERENTIAL/PLATELET
Absolute Lymphocytes: 2784 {cells}/uL (ref 850–3900)
Absolute Monocytes: 248 {cells}/uL (ref 200–950)
Basophils Absolute: 56 {cells}/uL (ref 0–200)
Basophils Relative: 0.7 %
Eosinophils Absolute: 16 {cells}/uL (ref 15–500)
Eosinophils Relative: 0.2 %
HCT: 34.4 % — ABNORMAL LOW (ref 35.9–46.0)
Hemoglobin: 11.2 g/dL — ABNORMAL LOW (ref 11.7–15.5)
MCH: 29.9 pg (ref 27.0–33.0)
MCHC: 32.6 g/dL (ref 31.6–35.4)
MCV: 92 fL (ref 81.4–101.7)
MPV: 9 fL (ref 7.5–12.5)
Monocytes Relative: 3.1 %
Neutro Abs: 4896 {cells}/uL (ref 1500–7800)
Neutrophils Relative %: 61.2 %
Platelets: 386 Thousand/uL (ref 140–400)
RBC: 3.74 Million/uL — ABNORMAL LOW (ref 3.80–5.10)
RDW: 12.4 % (ref 11.0–15.0)
Total Lymphocyte: 34.8 %
WBC: 8 Thousand/uL (ref 3.8–10.8)

## 2024-05-14 LAB — HM MAMMOGRAPHY

## 2024-05-15 ENCOUNTER — Ambulatory Visit: Payer: Self-pay

## 2024-05-29 ENCOUNTER — Ambulatory Visit: Admitting: Podiatry

## 2024-06-04 ENCOUNTER — Encounter: Payer: Self-pay | Admitting: Internal Medicine

## 2024-06-04 ENCOUNTER — Ambulatory Visit: Payer: Self-pay | Admitting: Internal Medicine

## 2024-06-04 VITALS — BP 126/70 | HR 76 | Temp 98.3°F | Ht 61.0 in | Wt 144.4 lb

## 2024-06-04 DIAGNOSIS — M0579 Rheumatoid arthritis with rheumatoid factor of multiple sites without organ or systems involvement: Secondary | ICD-10-CM | POA: Diagnosis not present

## 2024-06-04 DIAGNOSIS — E039 Hypothyroidism, unspecified: Secondary | ICD-10-CM | POA: Diagnosis not present

## 2024-06-04 DIAGNOSIS — E7800 Pure hypercholesterolemia, unspecified: Secondary | ICD-10-CM | POA: Diagnosis not present

## 2024-06-04 DIAGNOSIS — Z8249 Family history of ischemic heart disease and other diseases of the circulatory system: Secondary | ICD-10-CM | POA: Diagnosis not present

## 2024-06-04 DIAGNOSIS — E559 Vitamin D deficiency, unspecified: Secondary | ICD-10-CM

## 2024-06-04 DIAGNOSIS — Z0000 Encounter for general adult medical examination without abnormal findings: Secondary | ICD-10-CM | POA: Diagnosis not present

## 2024-06-04 DIAGNOSIS — D649 Anemia, unspecified: Secondary | ICD-10-CM

## 2024-06-04 NOTE — Patient Instructions (Addendum)
Coronary Calcium Scan A coronary calcium scan is an imaging test used to look for deposits of plaque in the inner lining of the blood vessels of the heart (coronary arteries). Plaque is made up of calcium, protein, and fatty substances. These deposits of plaque can partly clog and narrow the coronary arteries without producing any symptoms or warning signs. This puts a person at risk for a heart attack. A coronary calcium scan is performed using a computed tomography (CT) scanner machine without using a dye (contrast). This test is recommended for people who are at moderate risk for heart disease. The test can find plaque deposits before symptoms develop. Tell a health care provider about: Any allergies you have. All medicines you are taking, including vitamins, herbs, eye drops, creams, and over-the-counter medicines. Any problems you or family members have had with anesthetic medicines. Any bleeding problems you have. Any surgeries you have had. Any medical conditions you have. Whether you are pregnant or may be pregnant. What are the risks? Generally, this is a safe procedure. However, problems may occur, including: Harm to a pregnant woman and her unborn baby. This test involves the use of radiation. Radiation exposure can be dangerous to a pregnant woman and her unborn baby. If you are pregnant or think you may be pregnant, you should not have this procedure done. A slight increase in the risk of cancer. This is because of the radiation involved in the test. The amount of radiation from one test is similar to the amount of radiation you are naturally exposed to over one year. What happens before the procedure? Ask your health care provider for any specific instructions on how to prepare for this procedure. You may be asked to avoid products that contain caffeine, tobacco, or nicotine for 4 hours before the procedure. What happens during the procedure?  You will undress and remove any jewelry  from your neck or chest. You may need to remove hearing aides and dentures. Women may need to remove their bras. You will put on a hospital gown. Sticky electrodes will be placed on your chest. The electrodes will be connected to an electrocardiogram (ECG) machine to record a tracing of the electrical activity of your heart. You will lie down on your back on a curved bed that is attached to the CT scanner. You may be given medicine to slow down your heart rate so that clear pictures can be created. You will be moved into the CT scanner, and the CT scanner will take pictures of your heart. During this time, you will be asked to lie still and hold your breath for 10-20 seconds at a time while each picture of your heart is being taken. The procedure may vary among health care providers and hospitals. What can I expect after the procedure? You can return to your normal activities. It is up to you to get the results of your procedure. Ask your health care provider, or the department that is doing the procedure, when your results will be ready. Summary A coronary calcium scan is an imaging test used to look for deposits of plaque in the inner lining of the blood vessels of the heart. Plaque is made up of calcium, protein, and fatty substances. A coronary calcium scan is performed using a CT scanner machine without contrast. Generally, this is a safe procedure. Tell your health care provider if you are pregnant or may be pregnant. Ask your health care provider for any specific instructions on how to   prepare for this procedure. You can return to your normal activities after the scan is done. This information is not intended to replace advice given to you by your health care provider. Make sure you discuss any questions you have with your health care provider. Document Revised: 04/26/2021 Document Reviewed: 04/26/2021 Elsevier Patient Education  2024 Elsevier Inc.  Health Maintenance, Female Adopting a  healthy lifestyle and getting preventive care are important in promoting health and wellness. Ask your health care provider about: The right schedule for you to have regular tests and exams. Things you can do on your own to prevent diseases and keep yourself healthy. What should I know about diet, weight, and exercise? Eat a healthy diet  Eat a diet that includes plenty of vegetables, fruits, low-fat dairy products, and lean protein. Do not eat a lot of foods that are high in solid fats, added sugars, or sodium. Maintain a healthy weight Body mass index (BMI) is used to identify weight problems. It estimates body fat based on height and weight. Your health care provider can help determine your BMI and help you achieve or maintain a healthy weight. Get regular exercise Get regular exercise. This is one of the most important things you can do for your health. Most adults should: Exercise for at least 150 minutes each week. The exercise should increase your heart rate and make you sweat (moderate-intensity exercise). Do strengthening exercises at least twice a week. This is in addition to the moderate-intensity exercise. Spend less time sitting. Even light physical activity can be beneficial. Watch cholesterol and blood lipids Have your blood tested for lipids and cholesterol at 77 years of age, then have this test every 5 years. Have your cholesterol levels checked more often if: Your lipid or cholesterol levels are high. You are older than 77 years of age. You are at high risk for heart disease. What should I know about cancer screening? Depending on your health history and family history, you may need to have cancer screening at various ages. This may include screening for: Breast cancer. Cervical cancer. Colorectal cancer. Skin cancer. Lung cancer. What should I know about heart disease, diabetes, and high blood pressure? Blood pressure and heart disease High blood pressure causes  heart disease and increases the risk of stroke. This is more likely to develop in people who have high blood pressure readings or are overweight. Have your blood pressure checked: Every 3-5 years if you are 18-39 years of age. Every year if you are 40 years old or older. Diabetes Have regular diabetes screenings. This checks your fasting blood sugar level. Have the screening done: Once every three years after age 40 if you are at a normal weight and have a low risk for diabetes. More often and at a younger age if you are overweight or have a high risk for diabetes. What should I know about preventing infection? Hepatitis B If you have a higher risk for hepatitis B, you should be screened for this virus. Talk with your health care provider to find out if you are at risk for hepatitis B infection. Hepatitis C Testing is recommended for: Everyone born from 1945 through 1965. Anyone with known risk factors for hepatitis C. Sexually transmitted infections (STIs) Get screened for STIs, including gonorrhea and chlamydia, if: You are sexually active and are younger than 77 years of age. You are older than 77 years of age and your health care provider tells you that you are at risk for this   type of infection. Your sexual activity has changed since you were last screened, and you are at increased risk for chlamydia or gonorrhea. Ask your health care provider if you are at risk. Ask your health care provider about whether you are at high risk for HIV. Your health care provider may recommend a prescription medicine to help prevent HIV infection. If you choose to take medicine to prevent HIV, you should first get tested for HIV. You should then be tested every 3 months for as long as you are taking the medicine. Pregnancy If you are about to stop having your period (premenopausal) and you may become pregnant, seek counseling before you get pregnant. Take 400 to 800 micrograms (mcg) of folic acid every day  if you become pregnant. Ask for birth control (contraception) if you want to prevent pregnancy. Osteoporosis and menopause Osteoporosis is a disease in which the bones lose minerals and strength with aging. This can result in bone fractures. If you are 65 years old or older, or if you are at risk for osteoporosis and fractures, ask your health care provider if you should: Be screened for bone loss. Take a calcium or vitamin D supplement to lower your risk of fractures. Be given hormone replacement therapy (HRT) to treat symptoms of menopause. Follow these instructions at home: Alcohol use Do not drink alcohol if: Your health care provider tells you not to drink. You are pregnant, may be pregnant, or are planning to become pregnant. If you drink alcohol: Limit how much you have to: 0-1 drink a day. Know how much alcohol is in your drink. In the [place] one drink equals one 12 oz bottle of beer (355 mL), one 5 oz glass of wine (148 mL), or one 1 oz glass of hard liquor (44 mL). Lifestyle Do not use any products that contain nicotine or tobacco. These products include cigarettes, chewing tobacco, and vaping devices, such as e-cigarettes. If you need help quitting, ask your health care provider. Do not use street drugs. Do not share needles. Ask your health care provider for help if you need support or information about quitting drugs. General instructions Schedule regular health, dental, and eye exams. Stay current with your vaccines. Tell your health care provider if: You often feel depressed. You have ever been abused or do not feel safe at home. Summary Adopting a healthy lifestyle and getting preventive care are important in promoting health and wellness. Follow your health care provider's instructions about healthy diet, exercising, and getting tested or screened for diseases. Follow your health care provider's instructions on monitoring your cholesterol and blood pressure. This  information is not intended to replace advice given to you by your health care provider. Make sure you discuss any questions you have with your health care provider. Document Revised: 10/06/2020 Document Reviewed: 10/06/2020 Elsevier Patient Education  2024 Elsevier Inc.  

## 2024-06-04 NOTE — Progress Notes (Signed)
 I,Evelyn Orr, CMA,acting as a neurosurgeon for Evelyn LOISE Slocumb, MD.,have documented all relevant documentation on the behalf of Evelyn LOISE Slocumb, MD,as directed by  Evelyn LOISE Slocumb, MD while in the presence of Evelyn LOISE Slocumb, MD.  Subjective:    Patient ID: Evelyn Orr  , female    DOB: 12/09/1945 , 79 y.o.   MRN: [id number]  Chief Complaint  Patient presents with   Annual Exam    Patient presents today for a full physical examination.  She is followed by Evelyn Monte, PA for her GYN exam. She reports compliance with meds. Denies headaches, chest pain and shortness of breath. She reports completing mammo at [facility] in Dec. Letter sent.    Hypothyroidism    HPI Discussed the use of AI scribe software for clinical note transcription with the patient, who gave verbal consent to proceed.  History of Present Illness Evelyn Orr  is a 79 year old female with rheumatoid arthritis who presents for a physical exam and blood pressure check.  She has ongoing issues with rheumatoid arthritis, experiencing a recent severe flare-up. She completed a second round of prednisone  yesterday. Her regular medications include sulfasalazine  and rifampin , with the latter being her last dose for the month. She is currently not taking leflunomide  due to the rifampin  treatment. She experiences significant pain, particularly in her hands, and typically waits until the pain reaches an eight out of ten before taking Tylenol , as she prefers to avoid medication when possible.  She is taking Synthroid  daily and reports having an adequate supply, despite a discrepancy in her prescription refill timing. Her thyroid  function was checked in December.  Her cholesterol was noted to be high in September, with an LDL level of 120 mg/dL. She does not use MyChart and therefore did not receive messages regarding her lab results.  Her potassium levels were previously low but were rechecked in December and found to be  normal. Her blood count was slightly low in December, but stable.  She maintains an active lifestyle, exercising four days a week, including walking a mile and using various machines at the gym. She recently started taking vitamin D  gel supplements.  Regular bowel movements and performs self-breast exams.    Thyroid  Problem Presents for follow-up visit. Patient reports no hoarse voice, leg swelling or visual change. The symptoms have been stable.     Past Medical History:  Diagnosis Date   Anemia    Arthritis    generalized   Breast cancer (HCC)    Breast cancer of upper-outer quadrant of right female breast (HCC) 03/20/2015   Cataract    Cough    GERD (gastroesophageal reflux disease)    uses PRN meds/with certain foods   Hypertension    Hypothyroidism    Nasal congestion    PONV (postoperative nausea and vomiting)    Rheumatoid arthritis(714.0)    Seasonal allergies    Thyroid  disease    hypothyroidism     Family History  Problem Relation Age of Onset   Colon polyps Mother    Hypertension Mother    Heart disease Father    Heart attack Father    Other Sister        high breast density   Colon polyps Brother    Prostate cancer Brother 54       unknown Gleason   Colon polyps Brother    Prostate cancer Maternal Uncle    Prostate cancer Maternal Uncle    Prostate cancer Maternal  Uncle    Breast cancer Paternal Aunt        dx. 35 or younger   Colon cancer Paternal Uncle    Prostate cancer Maternal Grandfather        dx. older age   Colon polyps Son 61       colonoscopy for unspecified reason; found some colon polyps, has not had one since   Breast cancer Cousin        4 maternal cousin had breast cancer    Colon cancer Other    Cancer Other        MGM's sister   Rectal cancer Neg Hx    Stomach cancer Neg Hx    Esophageal cancer Neg Hx      Current Outpatient Medications:    albuterol  (VENTOLIN  HFA) 108 (90 Base) MCG/ACT inhaler, Inhale 2 puffs into the  lungs every 6 (six) hours as needed for wheezing or shortness of breath., Disp: 18 each, Rfl: 1   aspirin  EC 81 MG tablet, Take 1 tablet (81 mg total) by mouth 2 (two) times daily after a meal. For 2 weeks then once a day for 2 weeks for dvt prevention., Disp: 45 tablet, Rfl: 0   baclofen  (LIORESAL ) 10 MG tablet, TAKE 1 TABLET BY MOUTH EVERY DAY AT BEDTIME AS NEEDED, Disp: 30 tablet, Rfl: 0   benzonatate  (TESSALON ) 100 MG capsule, Take 1 capsule (100 mg total) by mouth 2 (two) times daily as needed for cough., Disp: 30 capsule, Rfl: 1   cetirizine  (ZYRTEC  ALLERGY ) 10 MG tablet, Take 1 tablet (10 mg total) by mouth daily., Disp: , Rfl:    Ferric Maltol  (ACCRUFER ) 30 MG CAPS, Take 1 capsule (30 mg total) by mouth daily., Disp: 90 capsule, Rfl: 1   folic acid  (FOLVITE ) 1 MG tablet, Take 1 mg by mouth daily., Disp: , Rfl:    HYDROcodone -acetaminophen  (NORCO/VICODIN) 5-325 MG tablet, Take 1-2 tablets by mouth every 6 (six) hours as needed for moderate pain (pain score 4-6) or severe pain (pain score 7-10) (post op pain)., Disp: 30 tablet, Rfl: 0   leflunomide  (ARAVA ) 20 MG tablet, Take 20 mg by mouth daily., Disp: , Rfl:    levothyroxine  (SYNTHROID ) 88 MCG tablet, TAKE 1 TABLET BY MOUTH EVERY DAY, Disp: 90 tablet, Rfl: 0   montelukast  (SINGULAIR ) 10 MG tablet, TAKE 1 TABLET BY MOUTH EVERY DAY, Disp: 90 tablet, Rfl: 2   rifampin  (RIFADIN ) 300 MG capsule, Take 2 capsules (600 mg total) by mouth daily., Disp: 60 capsule, Rfl: 3   sulfaSALAzine  (AZULFIDINE ) 500 MG tablet, Take 1,000 mg by mouth 2 (two) times daily., Disp: , Rfl:    tiZANidine  (ZANAFLEX ) 2 MG tablet, Take 1-2 tablets (2-4 mg total) by mouth every 6 (six) hours as needed for muscle spasms., Disp: 40 tablet, Rfl: 1   triamcinolone  cream (KENALOG ) 0.1 %, Apply 1 Application topically 2 (two) times daily., Disp: 30 g, Rfl: 0   No Known Allergies    The patient states she uses post menopausal status for birth control. No LMP recorded. Patient is  postmenopausal.. Negative for Dysmenorrhea. Negative for: breast discharge, breast lump(s), breast pain and breast self exam. Associated symptoms include abnormal vaginal bleeding. Pertinent negatives include abnormal bleeding (hematology), anxiety, decreased libido, depression, difficulty falling sleep, dyspareunia, history of infertility, nocturia, sexual dysfunction, sleep disturbances, urinary incontinence, urinary urgency, vaginal discharge and vaginal itching. Diet regular.The patient states her exercise level is  moderate, she exercises regularly.   . The patient's tobacco  use is: Tobacco Use History[1]. She has been exposed to passive smoke. The patient's alcohol use is:  Social History   Substance and Sexual Activity  Alcohol Use No    Review of Systems  Constitutional: Negative.   HENT: Negative.  Negative for hoarse voice.   Eyes: Negative.   Respiratory: Negative.    Cardiovascular: Negative.   Gastrointestinal: Negative.   Endocrine: Negative.   Genitourinary: Negative.   Musculoskeletal:  Positive for arthralgias.  Skin: Negative.   Allergic/Immunologic: Negative.   Neurological: Negative.   Hematological: Negative.   Psychiatric/Behavioral: Negative.       Today's Vitals   06/04/24 0843  BP: 126/70  Pulse: 76  Temp: 98.3 F (36.8 C)  SpO2: 98%  Weight: 144 lb 6.4 oz (65.5 kg)  Height: 5' 1 (1.549 m)   Body mass index is 27.28 kg/m.  Wt Readings from Last 3 Encounters:  06/04/24 144 lb 6.4 oz (65.5 kg)  03/29/24 144 lb (65.3 kg)  02/23/24 142 lb 3.2 oz (64.5 kg)     Objective:  Physical Exam Vitals and nursing note reviewed.  Constitutional:      Appearance: Normal appearance.  HENT:     Head: Normocephalic and atraumatic.     Right Ear: Tympanic membrane, ear canal and external ear normal.     Left Ear: Tympanic membrane, ear canal and external ear normal.     Nose: Nose normal.     Mouth/Throat:     Mouth: Mucous membranes are moist.      Pharynx: Oropharynx is clear.  Eyes:     Extraocular Movements: Extraocular movements intact.     Conjunctiva/sclera: Conjunctivae normal.     Pupils: Pupils are equal, round, and reactive to light.  Cardiovascular:     Rate and Rhythm: Normal rate and regular rhythm.     Pulses: Normal pulses.     Heart sounds: Normal heart sounds.  Pulmonary:     Effort: Pulmonary effort is normal.     Breath sounds: Normal breath sounds.  Chest:  Breasts:    Tanner Score is 5.     Right: Normal.     Left: Normal.       Comments: B/l healed surgical scars Abdominal:     General: Abdomen is flat. Bowel sounds are normal.     Palpations: Abdomen is soft.  Genitourinary:    Comments: deferred Musculoskeletal:        General: Swelling and tenderness present. Normal range of motion.     Cervical back: Normal range of motion and neck supple.     Comments: B/l hand joint swelling/joint deformity  Skin:    General: Skin is warm and dry.  Neurological:     General: No focal deficit present.     Mental Status: She is alert and oriented to person, place, and time.  Psychiatric:        Mood and Affect: Mood normal.        Behavior: Behavior normal.         Assessment And Plan:     Encounter for general adult medical examination w/o abnormal findings Assessment & Plan: A full exam was performed.  Importance of monthly self breast exams was discussed with the patient.  She is advised to get 30-45 minutes of regular exercise, no less than four to five days per week. Both weight-bearing and aerobic exercises are recommended.  She is advised to follow a healthy diet with at least six fruits/veggies per day,  decrease intake of red meat and other saturated fats and to increase fish intake to twice weekly.  Meats/fish should not be fried -- baked, boiled or broiled is preferable. It is also important to cut back on your sugar intake.  Be sure to read labels - try to avoid anything with added sugar, high  fructose corn syrup or other sweeteners.  If you must use a sweetener, you can try stevia or monkfruit.  It is also important to avoid artificially sweetened foods/beverages and diet drinks. Lastly, wear SPF 50 sunscreen on exposed skin and when in direct sunlight for an extended period of time.  Be sure to avoid fast food restaurants and aim for at least 60 ounces of water  daily.       Primary hypothyroidism Assessment & Plan: Thyroid  function tests normal on current Synthroid  regimen.  She is currently taking Synthroid  88mcg daily.  - Continue Synthroid  as prescribed. - Scheduled six-month thyroid  check. - Continue Synthroid  88 mcg daily. - Monitor for symptoms of thyroid  dysfunction, such as fatigue, cold intolerance, diarrhea, or jitteriness.   Orders: -     TSH + free T4  Rheumatoid arthritis involving multiple sites with positive rheumatoid factor (HCC) Assessment & Plan: Chronic, closely followed by Rheumatology.  Recent flare-up with significant pain due to leflunomide  being held for TB treatment. - Hold leflunomide  while on rifampin . - Use Tylenol  for pain management starting at pain level 5. - Leflunomide  being on hold likely contributes to recent flare.  - Continue with DMARD therapy - Follow anti-inflammatory diet.    Pure hypercholesterolemia Assessment & Plan: High cholesterol with LDL at 120 mg/dL. Discussed need for medication based on cardiac calcium score. - Ordered cardiac calcium score. - Provided printout about cardiac calcium score. - Discuss potential cholesterol medication based on calcium score results.  Orders: -     CT CARDIAC SCORING (SELF PAY ONLY); Future  Vitamin D  deficiency disease -     VITAMIN D  25 Hydroxy (Vit-D Deficiency, Fractures)  Family history of heart disease in female family member before age 19 -     CT CARDIAC SCORING (SELF PAY ONLY); Future   Return for 1 year HM, 6 month thyroid  f/u.SABRA Patient was given opportunity to ask  questions. Patient verbalized understanding of the plan and was able to repeat key elements of the plan. All questions were answered to their satisfaction.   I, Evelyn LOISE Slocumb, MD, have reviewed all documentation for this visit. The documentation on 06/04/2024 for the exam, diagnosis, procedures, and orders are all accurate and complete.       [1]  Social History Tobacco Use  Smoking Status Former   Current packs/day: 0.00   Average packs/day: 0.3 packs/day for 21.0 years (5.3 ttl pk-yrs)   Types: Cigarettes   Start date: 02/13/1963   Quit date: 02/13/1984   Years since quitting: 40.3   Passive exposure: Never  Smokeless Tobacco Never

## 2024-06-05 LAB — VITAMIN D 25 HYDROXY (VIT D DEFICIENCY, FRACTURES): Vit D, 25-Hydroxy: 19 ng/mL — ABNORMAL LOW (ref 30.0–100.0)

## 2024-06-05 LAB — TSH+FREE T4
Free T4: 0.84 ng/dL (ref 0.82–1.77)
TSH: 12.5 u[IU]/mL — ABNORMAL HIGH (ref 0.450–4.500)

## 2024-06-06 ENCOUNTER — Encounter: Payer: Self-pay | Admitting: Internal Medicine

## 2024-06-07 ENCOUNTER — Ambulatory Visit: Payer: Self-pay | Admitting: Internal Medicine

## 2024-06-07 ENCOUNTER — Ambulatory Visit (HOSPITAL_COMMUNITY)
Admission: RE | Admit: 2024-06-07 | Discharge: 2024-06-07 | Disposition: A | Discharge status: Home / Self Care | Payer: Self-pay | Source: Ambulatory Visit | Attending: Internal Medicine | Admitting: Internal Medicine

## 2024-06-07 DIAGNOSIS — E7800 Pure hypercholesterolemia, unspecified: Secondary | ICD-10-CM | POA: Insufficient documentation

## 2024-06-07 DIAGNOSIS — Z8249 Family history of ischemic heart disease and other diseases of the circulatory system: Secondary | ICD-10-CM | POA: Insufficient documentation

## 2024-06-10 DIAGNOSIS — E7800 Pure hypercholesterolemia, unspecified: Secondary | ICD-10-CM | POA: Insufficient documentation

## 2024-06-10 NOTE — Assessment & Plan Note (Signed)

## 2024-06-10 NOTE — Assessment & Plan Note (Signed)
 Thyroid  function tests normal on current Synthroid  regimen.  She is currently taking Synthroid  88mcg daily.  - Continue Synthroid  as prescribed. - Scheduled six-month thyroid  check. - Continue Synthroid  88 mcg daily. - Monitor for symptoms of thyroid  dysfunction, such as fatigue, cold intolerance, diarrhea, or jitteriness.

## 2024-06-10 NOTE — Assessment & Plan Note (Signed)
 High cholesterol with LDL at 120 mg/dL. Discussed need for medication based on cardiac calcium score. - Ordered cardiac calcium score. - Provided printout about cardiac calcium score. - Discuss potential cholesterol medication based on calcium score results.

## 2024-06-10 NOTE — Assessment & Plan Note (Signed)
 Chronic, closely followed by Rheumatology.  Recent flare-up with significant pain due to leflunomide  being held for TB treatment. - Hold leflunomide  while on rifampin . - Use Tylenol  for pain management starting at pain level 5. - Leflunomide  being on hold likely contributes to recent flare.  - Continue with DMARD therapy - Follow anti-inflammatory diet.

## 2024-06-20 ENCOUNTER — Other Ambulatory Visit: Payer: Self-pay | Admitting: Internal Medicine

## 2024-06-23 ENCOUNTER — Other Ambulatory Visit: Payer: Self-pay | Admitting: Internal Medicine

## 2024-07-04 ENCOUNTER — Encounter: Payer: Self-pay | Admitting: Podiatry

## 2024-07-04 ENCOUNTER — Ambulatory Visit: Admitting: Podiatry

## 2024-07-04 DIAGNOSIS — B351 Tinea unguium: Secondary | ICD-10-CM

## 2024-07-04 DIAGNOSIS — M79671 Pain in right foot: Secondary | ICD-10-CM

## 2024-07-04 DIAGNOSIS — Q828 Other specified congenital malformations of skin: Secondary | ICD-10-CM

## 2024-07-05 ENCOUNTER — Other Ambulatory Visit: Payer: Self-pay | Admitting: Infectious Diseases

## 2024-07-05 ENCOUNTER — Encounter: Payer: Self-pay | Admitting: Podiatry

## 2024-07-05 NOTE — Progress Notes (Signed)
"  °  Subjective:  Patient ID: Evelyn Orr , female    DOB: [birth year],  MRN: [id number]  Evelyn Orr  presents to clinic today for painful porokeratotic lesion(s) left foot and painful mycotic toenails that limit ambulation. Painful toenails interfere with ambulation. Aggravating factors include wearing enclosed shoe gear. Pain is relieved with periodic professional debridement. Painful porokeratotic lesions are aggravated when weightbearing with and without shoegear. Pain is relieved with periodic professional debridement.  Chief Complaint  Patient presents with   RFC     RFC Non diabetic toenail trim. LOV with PCP 06/07/24.   New problem(s): None.   PCP is Jarold Medici, MD.  Allergies[1]  Review of Systems: Negative except as noted in the HPI.  Objective: No changes noted in today's physical examination. There were no vitals filed for this visit. Evelyn Orr  is a pleasant 79 y.o. female WD, WN in NAD. AAO x 3.  Vascular Examination: Vascular status intact b/l with palpable pedal pulses. CFT immediate b/l. Pedal hair present. No edema. No pain with calf compression b/l. Skin temperature gradient WNL b/l. No varicosities noted. No cyanosis or clubbing noted.  Neurological Examination: Sensation grossly intact b/l with 10 gram monofilament. Vibratory sensation intact b/l.  Dermatological Examination: Pedal skin with normal turgor, texture and tone b/l. No open wounds nor interdigital macerations noted.   Toenails 1-5 b/l well maintained with adequate length. No erythema, no edema, no drainage, no fluctuance.   Hyperkeratotic lesion(s) porokeratosis plantar heel left foot.  No erythema, no edema, no drainage, no fluctuance.   Musculoskeletal Examination: Muscle strength 5/5 to b/l LE.  No pain, crepitus noted b/l. No gross pedal deformities. Patient ambulates independently without assistive aids.   Radiographs: None  Assessment/Plan: 1. Pain due to  onychomycosis of toenails of both feet   2. Porokeratosis   3. Pain in both feet   Consent given for treatment. Patient examined. All patient's and/or POA's questions/concerns addressed on today's visit. Mycotic toenails 1-5 b/l debrided in length and girth without incident. Treatment was provided by assistant Andrez Manchester under my supervision.  Porokeratotic lesion(s) plantar heel pad of left foot pared and enucleated with sharp debridement without incident.Continue soft, supportive shoe gear daily. Report any pedal injuries to medical professional. Call office if there are any quesitons/concerns. Patient/POA to call should there be question/concern in the interim.   Return in about 3 months (around 10/01/2024).  Delon LITTIE Merlin, DPM      [facility] LOCATION: [address] [place] [address]                   Office [phone]   [facility] LOCATION: [address] [place], [place] [address] Office [phone]     [1] No Known Allergies  "

## 2024-08-21 ENCOUNTER — Ambulatory Visit: Admitting: Podiatry

## 2024-09-05 ENCOUNTER — Ambulatory Visit: Payer: Medicare PPO

## 2024-10-02 ENCOUNTER — Ambulatory Visit: Admitting: Podiatry

## 2024-10-09 ENCOUNTER — Ambulatory Visit: Admitting: Podiatry

## 2024-12-17 ENCOUNTER — Ambulatory Visit: Payer: Self-pay | Admitting: Internal Medicine

## 2025-01-01 ENCOUNTER — Ambulatory Visit: Admitting: Podiatry

## 2025-06-05 ENCOUNTER — Encounter: Payer: Self-pay | Admitting: Internal Medicine
